# Patient Record
Sex: Male | Born: 1940 | Race: White | Hispanic: No | Marital: Married | Smoking: Former smoker
Health system: Southern US, Community
[De-identification: ages and names within clinical notes are randomized; demographics above are authoritative.]

## PROBLEM LIST (undated history)

## (undated) DIAGNOSIS — I499 Cardiac arrhythmia, unspecified: Secondary | ICD-10-CM

## (undated) DIAGNOSIS — I509 Heart failure, unspecified: Secondary | ICD-10-CM

## (undated) DIAGNOSIS — C61 Malignant neoplasm of prostate: Secondary | ICD-10-CM

## (undated) DIAGNOSIS — E785 Hyperlipidemia, unspecified: Secondary | ICD-10-CM

## (undated) DIAGNOSIS — I2510 Atherosclerotic heart disease of native coronary artery without angina pectoris: Secondary | ICD-10-CM

## (undated) DIAGNOSIS — Z923 Personal history of irradiation: Secondary | ICD-10-CM

## (undated) DIAGNOSIS — I739 Peripheral vascular disease, unspecified: Secondary | ICD-10-CM

## (undated) DIAGNOSIS — R0600 Dyspnea, unspecified: Secondary | ICD-10-CM

## (undated) DIAGNOSIS — N189 Chronic kidney disease, unspecified: Secondary | ICD-10-CM

## (undated) DIAGNOSIS — Z87442 Personal history of urinary calculi: Secondary | ICD-10-CM

## (undated) DIAGNOSIS — C801 Malignant (primary) neoplasm, unspecified: Secondary | ICD-10-CM

## (undated) DIAGNOSIS — M1990 Unspecified osteoarthritis, unspecified site: Secondary | ICD-10-CM

## (undated) DIAGNOSIS — J449 Chronic obstructive pulmonary disease, unspecified: Secondary | ICD-10-CM

## (undated) DIAGNOSIS — I10 Essential (primary) hypertension: Secondary | ICD-10-CM

## (undated) DIAGNOSIS — E119 Type 2 diabetes mellitus without complications: Secondary | ICD-10-CM

## (undated) HISTORY — DX: Hyperlipidemia, unspecified: E78.5

## (undated) HISTORY — PX: SPINAL FUSION: SHX223

## (undated) HISTORY — PX: PROSTATE CRYOABLATION: SUR358

## (undated) HISTORY — PX: AMPUTATION FINGER / THUMB: SUR24

## (undated) HISTORY — DX: Cardiac arrhythmia, unspecified: I49.9

## (undated) HISTORY — DX: Malignant neoplasm of prostate: C61

## (undated) HISTORY — DX: Heart failure, unspecified: I50.9

## (undated) HISTORY — DX: Chronic obstructive pulmonary disease, unspecified: J44.9

---

## 1973-10-20 HISTORY — PX: BACK SURGERY: SHX140

## 2005-06-01 ENCOUNTER — Emergency Department: Payer: Self-pay | Admitting: Emergency Medicine

## 2006-03-12 ENCOUNTER — Ambulatory Visit: Payer: Self-pay | Admitting: Urology

## 2006-03-17 ENCOUNTER — Ambulatory Visit: Payer: Self-pay | Admitting: Urology

## 2006-03-23 ENCOUNTER — Ambulatory Visit: Payer: Self-pay | Admitting: Urology

## 2006-05-12 ENCOUNTER — Ambulatory Visit: Payer: Self-pay | Admitting: Urology

## 2006-05-12 ENCOUNTER — Other Ambulatory Visit: Payer: Self-pay

## 2006-05-19 ENCOUNTER — Ambulatory Visit: Payer: Self-pay | Admitting: Urology

## 2006-11-09 ENCOUNTER — Ambulatory Visit: Payer: Self-pay | Admitting: Urology

## 2007-12-08 ENCOUNTER — Ambulatory Visit: Payer: Self-pay | Admitting: Unknown Physician Specialty

## 2007-12-19 ENCOUNTER — Ambulatory Visit: Payer: Self-pay | Admitting: Unknown Physician Specialty

## 2012-07-14 DIAGNOSIS — C61 Malignant neoplasm of prostate: Secondary | ICD-10-CM | POA: Insufficient documentation

## 2012-07-14 DIAGNOSIS — N62 Hypertrophy of breast: Secondary | ICD-10-CM | POA: Insufficient documentation

## 2012-07-14 DIAGNOSIS — N529 Male erectile dysfunction, unspecified: Secondary | ICD-10-CM | POA: Insufficient documentation

## 2012-10-20 HISTORY — PX: STENT PLACEMENT ILIAC (ARMC HX): HXRAD1735

## 2012-11-30 ENCOUNTER — Ambulatory Visit: Payer: Self-pay | Admitting: Vascular Surgery

## 2012-11-30 LAB — BASIC METABOLIC PANEL
Anion Gap: 8 (ref 7–16)
BUN: 20 mg/dL — ABNORMAL HIGH (ref 7–18)
Chloride: 107 mmol/L (ref 98–107)
Co2: 24 mmol/L (ref 21–32)
EGFR (Non-African Amer.): 54 — ABNORMAL LOW
Glucose: 197 mg/dL — ABNORMAL HIGH (ref 65–99)
Osmolality: 286 (ref 275–301)
Potassium: 5.2 mmol/L — ABNORMAL HIGH (ref 3.5–5.1)
Sodium: 139 mmol/L (ref 136–145)

## 2013-05-04 DIAGNOSIS — R339 Retention of urine, unspecified: Secondary | ICD-10-CM | POA: Insufficient documentation

## 2014-04-04 ENCOUNTER — Ambulatory Visit: Payer: Self-pay | Admitting: Gastroenterology

## 2014-04-07 LAB — PATHOLOGY REPORT

## 2014-05-12 DIAGNOSIS — N183 Chronic kidney disease, stage 3 unspecified: Secondary | ICD-10-CM | POA: Insufficient documentation

## 2014-05-12 DIAGNOSIS — N1832 Chronic kidney disease, stage 3b: Secondary | ICD-10-CM | POA: Insufficient documentation

## 2015-02-02 DIAGNOSIS — I10 Essential (primary) hypertension: Secondary | ICD-10-CM | POA: Insufficient documentation

## 2015-02-02 DIAGNOSIS — M5136 Other intervertebral disc degeneration, lumbar region: Secondary | ICD-10-CM | POA: Insufficient documentation

## 2015-02-02 DIAGNOSIS — E1122 Type 2 diabetes mellitus with diabetic chronic kidney disease: Secondary | ICD-10-CM | POA: Insufficient documentation

## 2015-02-02 DIAGNOSIS — E7800 Pure hypercholesterolemia, unspecified: Secondary | ICD-10-CM | POA: Insufficient documentation

## 2015-02-09 NOTE — Op Note (Signed)
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   PROCEDURES PERFORMED: 1.  Abdominal aortogram.  2.  Percutaneous transluminal angioplasty and stent placement of the common iliac artery to 8 mm.  3.  Percutaneous transluminal angioplasty and stent placement of the left external iliac artery to 7 mm.   SURGEON: Hortencia Pilar, M.D.   SEDATION: Versed 4 mg plus fentanyl 150 mcg administered IV. Continuous ECG, pulse oximetry and cardiopulmonary monitoring was performed throughout the entire procedure by the interventional radiology nurse. Total sedation time was 50 minutes.   ACCESS: A 6 French sheath left common femoral artery.   FLUOROSCOPIC TIME: 3.2 minutes.   CONTRAST USED: Isovue 60 mL.   INDICATIONS: The patient is a 74 year old gentleman who presented with short-distance claudication and hip pain. Workup demonstrated subtotal occlusion of the common iliac on the left as well as diffuse disease throughout the iliac system on the left. The risks and benefits for angiography and possible intervention were reviewed. All questions answered. The patient has agreed to proceed.   DESCRIPTION OF PROCEDURE: The patient is taken to special procedures and placed in the supine position. After adequate sedation is achieved, the ultrasound is placed in a sterile sleeve. The common femoral artery is identified. It is echolucent and pulsatile, indicating patency. Images recorded for the permanent record. After scanning down the artery and localizing the femoral bifurcation, the artery is evaluated slightly more proximally. Then 1% lidocaine is infiltrated. Under real-time visualization,  microneedle is inserted into the anterior wall of the common femoral artery, microwire followed by micro sheath, J-wire followed by 5 French sheath and 5 French pigtail catheter. AP projection of the aorta is obtained, which demonstrates subtotal occlusion of the left common iliac artery, approximately 5 to 6 mm distal to the origin of the common iliac. Pigtail catheter is then repositioned and oblique views are obtained. Then 4000 units of heparin are given. Magic torque wire is introduced and the sheath is upsized from a 5 to a 6 [place] sheath.   An Omnilink a 7 x 19 stent is then advanced across the common iliac lesion and deployed to 10 atmospheres. Followup imaging suggested it is still somewhat under sized, but in perfect location and therefore an 8 mm balloon is used to post dilate the common iliac. The 8 mm inflation is for 1 minute to 10 atmospheres.   Attention is then turned, after followup imaging, to the origin of the external iliac, where an 8 x 30 LifeStar stent is deployed and then post dilated using the 7 mm balloon. Followup angiography demonstrates excellent result and there is now rapid flow of contrast down to the common femoral on the left. Oblique view of the groin is then obtained and a ProGlide device deployed. Initial ProGlide still shows significant oozing and therefore a second device is deployed before removing the wire. Light pressure was then held for 10 minutes and there are no immediate complications.   INTERPRETATION: The abdominal aorta is opacified with a bolus injection of contrast. Although  moderate atherosclerotic changes are noted, there are no hemodynamically significant stenoses. Bilateral renal arteries are noted, single renal artery to each kidney. Bilateral nephrograms are noted normal size. There is no evidence of hemodynamically significant renal artery stenosis. The right common  and external iliac arteries are widely patent. The left common iliac artery  demonstrates a 95% occlusion over several centimeters. There is a moderate stenosis at the origin of the internal. There is a severe stenosis at the origin of the external iliac artery on the left. Common femoral demonstrates diffuse disease, but does not appear to be hemodynamically significant.   Following angioplasty and post dilatation to 8 mm, the common iliac artery is well treated with minimal residual stenosis. Following stent placement and angioplasty to 7 mm to the external iliac artery, this artery is also well treated.   SUMMARY: Successful revascularization left lower extremity with treatment of multiple lesions within the left iliac system.     ____________________________ Katha Cabal, MD ggs:cc D: 11/30/2012 15:07:50 ET T: 11/30/2012 20:48:05 ET JOB#: [id number]  cc: Katha Cabal, MD, <Dictator> Lorenza Evangelist, MD  Katha Cabal MD ELECTRONICALLY SIGNED 12/06/2012 23:27

## 2015-05-13 DIAGNOSIS — K130 Diseases of lips: Secondary | ICD-10-CM | POA: Insufficient documentation

## 2015-06-21 ENCOUNTER — Other Ambulatory Visit: Payer: Self-pay | Admitting: Physical Medicine and Rehabilitation

## 2015-06-21 DIAGNOSIS — M5416 Radiculopathy, lumbar region: Secondary | ICD-10-CM

## 2015-06-29 ENCOUNTER — Ambulatory Visit
Admission: RE | Admit: 2015-06-29 | Discharge: 2015-06-29 | Disposition: A | Discharge status: Home / Self Care | Payer: Medicare Other | Source: Ambulatory Visit | Attending: Physical Medicine and Rehabilitation | Admitting: Physical Medicine and Rehabilitation

## 2015-06-29 DIAGNOSIS — M4806 Spinal stenosis, lumbar region: Secondary | ICD-10-CM | POA: Diagnosis not present

## 2015-06-29 DIAGNOSIS — M5186 Other intervertebral disc disorders, lumbar region: Secondary | ICD-10-CM | POA: Insufficient documentation

## 2015-06-29 DIAGNOSIS — M47896 Other spondylosis, lumbar region: Secondary | ICD-10-CM | POA: Diagnosis not present

## 2015-06-29 DIAGNOSIS — M79605 Pain in left leg: Secondary | ICD-10-CM | POA: Diagnosis present

## 2015-06-29 DIAGNOSIS — M79604 Pain in right leg: Secondary | ICD-10-CM | POA: Diagnosis present

## 2015-06-29 DIAGNOSIS — M545 Low back pain: Secondary | ICD-10-CM | POA: Diagnosis present

## 2015-06-29 DIAGNOSIS — M5416 Radiculopathy, lumbar region: Secondary | ICD-10-CM

## 2015-07-10 DIAGNOSIS — M48062 Spinal stenosis, lumbar region with neurogenic claudication: Secondary | ICD-10-CM | POA: Insufficient documentation

## 2016-06-13 ENCOUNTER — Encounter: Payer: Self-pay | Admitting: Emergency Medicine

## 2016-06-13 ENCOUNTER — Emergency Department
Admission: EM | Admit: 2016-06-13 | Discharge: 2016-06-13 | Disposition: A | Discharge status: Home / Self Care | Payer: Medicare Other | Attending: Student | Admitting: Student

## 2016-06-13 DIAGNOSIS — Z8546 Personal history of malignant neoplasm of prostate: Secondary | ICD-10-CM | POA: Diagnosis not present

## 2016-06-13 DIAGNOSIS — Z7984 Long term (current) use of oral hypoglycemic drugs: Secondary | ICD-10-CM | POA: Insufficient documentation

## 2016-06-13 DIAGNOSIS — Z79899 Other long term (current) drug therapy: Secondary | ICD-10-CM | POA: Diagnosis not present

## 2016-06-13 DIAGNOSIS — Z87891 Personal history of nicotine dependence: Secondary | ICD-10-CM | POA: Insufficient documentation

## 2016-06-13 DIAGNOSIS — E119 Type 2 diabetes mellitus without complications: Secondary | ICD-10-CM | POA: Insufficient documentation

## 2016-06-13 DIAGNOSIS — M25551 Pain in right hip: Secondary | ICD-10-CM | POA: Insufficient documentation

## 2016-06-13 DIAGNOSIS — G8929 Other chronic pain: Secondary | ICD-10-CM | POA: Diagnosis not present

## 2016-06-13 DIAGNOSIS — Z791 Long term (current) use of non-steroidal anti-inflammatories (NSAID): Secondary | ICD-10-CM | POA: Insufficient documentation

## 2016-06-13 HISTORY — DX: Unspecified osteoarthritis, unspecified site: M19.90

## 2016-06-13 HISTORY — DX: Malignant (primary) neoplasm, unspecified: C80.1

## 2016-06-13 HISTORY — DX: Type 2 diabetes mellitus without complications: E11.9

## 2016-06-13 MED ORDER — MELOXICAM 7.5 MG PO TABS
7.5000 mg | ORAL_TABLET | Freq: Once | ORAL | Status: AC
  Administered 2016-06-13: 7.5 mg via ORAL
  Filled 2016-06-13: qty 1

## 2016-06-13 MED ORDER — LIDOCAINE 4 % EX PTCH: 1.0000 | MEDICATED_PATCH | Freq: Every day | CUTANEOUS | 0 refills | Status: DC

## 2016-06-13 MED ORDER — BACLOFEN 10 MG PO TABS: 10.0000 mg | ORAL_TABLET | Freq: Three times a day (TID) | ORAL | 0 refills | Status: DC

## 2016-06-13 MED ORDER — MELOXICAM 7.5 MG PO TABS: 7.5000 mg | ORAL_TABLET | Freq: Every day | ORAL | 0 refills | Status: AC

## 2016-06-13 NOTE — ED Provider Notes (Signed)
[facility] Emergency Department Provider Note ____________________________________________  Time seen: Approximately 3:59 PM  I have reviewed the triage vital signs and the nursing notes.   HISTORY  Chief Complaint Hip Pain and Leg Pain    HPI Carlos Levine is a 75 y.o. male who presents to the emergency department for chronic pain management. He has been having severe right hip pain for the past couple of months. He has been evaluated by his primary care provider who treated him with oxycodone initially. He states that it did help some, however he is afraid to continue taking the medication because he "doesn't want to get hooked." His medication was then changed to tramadol, which has provided little to no relief and again he states that he doesn't want to get hooked. He states that over the past week, the pain in his hip has gotten much worse and he has become very interactive. He states that he stayed in bed most of the day, even for meals. He is not taking any pain medications at this time. He denies new injury. He denies a change in the location or quality of the pain. He states that stretching the muscles around the hip seems to be the only thing that provides any relief. He states that he pulls his knee up toward his chest as close as he can get it and hold onto it for a few minutes, this seems to relieve his pain as long as he does not get out of bed and move around.  Past Medical History:  Diagnosis Date  . Arthritis   . Cancer [facility])    prostate  . Diabetes mellitus without complication ([place])     There are no active problems to display for this patient.   Past Surgical History:  Procedure Laterality Date  . SPINAL FUSION      Prior to Admission medications   Medication Sig Start Date End Date Taking? Authorizing Provider  amLODipine (NORVASC) 5 MG tablet Take 5 mg by mouth daily.   Yes Historical Provider, MD  bicalutamide (CASODEX) 50 MG tablet  Take 50 mg by mouth daily.   Yes Historical Provider, MD  glimepiride (AMARYL) 4 MG tablet Take 4 mg by mouth daily with breakfast.   Yes Historical Provider, MD  pioglitazone (ACTOS) 45 MG tablet Take 45 mg by mouth daily.   Yes Historical Provider, MD  pravastatin (PRAVACHOL) 80 MG tablet Take 80 mg by mouth daily.   Yes Historical Provider, MD  traMADol (ULTRAM) 50 MG tablet Take 50 mg by mouth every 6 (six) hours as needed.   Yes Historical Provider, MD  baclofen (LIORESAL) 10 MG tablet Take 1 tablet (10 mg total) by mouth 3 (three) times daily. 06/13/16   Victorino Dike, FNP  Lidocaine 4 % PTCH Apply 1 patch topically daily. 06/13/16   Victorino Dike, FNP  meloxicam (MOBIC) 7.5 MG tablet Take 1 tablet (7.5 mg total) by mouth daily. 06/13/16 07/13/16  Victorino Dike, FNP    Allergies Ivp dye [iodinated diagnostic agents]  No family history on file.  Social History Social History  Substance Use Topics  . Smoking status: Former Research scientist (life sciences)  . Smokeless tobacco: Never Used  . Alcohol use No    Review of Systems Constitutional: No recent illness. Cardiovascular: Denies chest pain or palpitations. Respiratory: Denies shortness of breath. Musculoskeletal: Pain in Right hip Skin: Negative for rash, wound, lesion. Neurological: Negative for focal weakness or numbness.  ____________________________________________  PHYSICAL EXAM:  VITAL SIGNS: ED Triage Vitals  Enc Vitals Group     BP 06/13/16 1412 113/70     Pulse Rate 06/13/16 1412 82     Resp 06/13/16 1412 20     Temp 06/13/16 1412 97.7 F (36.5 C)     Temp Source 06/13/16 1412 Oral     SpO2 06/13/16 1412 97 %     Weight 06/13/16 1413 264 lb (119.7 kg)     Height 06/13/16 1413 6' (1.829 m)     Head Circumference --      Peak Flow --      Pain Score 06/13/16 1416 10     Pain Loc --      Pain Edu? --      Excl. in [place]? --     Constitutional: Alert and oriented. Well appearing and in no acute distress. Eyes: Conjunctivae  are normal. EOMI. Head: Atraumatic. Neck: No stridor.  Respiratory: Normal respiratory effort.   Musculoskeletal: Right hip pain throughout range of motion Neurologic:  Normal speech and language. No gross focal neurologic deficits are appreciated. Speech is normal. No gait instability. Skin:  Skin is warm, dry and intact. Atraumatic. Psychiatric: Mood and affect are normal. Speech and behavior are normal.  ____________________________________________   LABS (all labs ordered are listed, but only abnormal results are displayed)  Labs Reviewed - No data to display ____________________________________________  RADIOLOGY  Not indicated ____________________________________________   PROCEDURES  Procedure(s) performed: None   ____________________________________________   INITIAL IMPRESSION / ASSESSMENT AND PLAN / ED COURSE  Pertinent labs & imaging results that were available during my care of the patient were reviewed by me and considered in my medical decision making (see chart for details).  Medication management was discussed with the patient. Tonight, he will be given prescriptions for meloxicam 7.5 mg, baclofen 20 mg, and lidocaine patches. He was advised to call his primary care provider and discuss his new medications. He was advised that the meloxicam may cause a decrease in kidney function and this will need to be monitored by primary care. He states that if this helps with his pain, he will have to manage his kidney function some other way and continue the medication. He states that the pain is severe and something must be done, but refuses to take any type of controlled substance for pain management. Patient was advised to return to the emergency department for any symptom changes or worsen if he is unable schedule an appointment with his primary care provider. ____________________________________________   FINAL CLINICAL IMPRESSION(S) / ED DIAGNOSES  Final  diagnoses:  Chronic hip pain, right       Victorino Dike, FNP 06/14/16 0010    Joanne Gavel, MD 06/14/16 0040

## 2016-06-13 NOTE — ED Notes (Signed)
See triage note. States he has been having increased pain to right hip area for about 1 month.. Has been seen by PCP   Had MRI done.  States pain is getting progressively worse  Denies recent injury

## 2016-06-13 NOTE — ED Triage Notes (Signed)
C/O right hip and leg pain.  Onset of symptoms 1 month ago.  Has seen PCP for same complaint and had MRI done that showed arthritis and DJD

## 2016-06-13 NOTE — Discharge Instructions (Signed)
Please see Dr. Candiss Norse in about a week. Use your cane for the next few days. Increase the amount of water you are drinking.

## 2016-06-24 DIAGNOSIS — R42 Dizziness and giddiness: Secondary | ICD-10-CM | POA: Insufficient documentation

## 2017-06-11 ENCOUNTER — Emergency Department: Payer: Medicare Other

## 2017-06-11 ENCOUNTER — Observation Stay
Admission: EM | Admit: 2017-06-11 | Discharge: 2017-06-13 | Disposition: A | Discharge status: Home / Self Care | Payer: Medicare Other | Attending: Internal Medicine | Admitting: Internal Medicine

## 2017-06-11 ENCOUNTER — Encounter: Payer: Self-pay | Admitting: Emergency Medicine

## 2017-06-11 DIAGNOSIS — I10 Essential (primary) hypertension: Secondary | ICD-10-CM | POA: Diagnosis not present

## 2017-06-11 DIAGNOSIS — I129 Hypertensive chronic kidney disease with stage 1 through stage 4 chronic kidney disease, or unspecified chronic kidney disease: Secondary | ICD-10-CM | POA: Diagnosis not present

## 2017-06-11 DIAGNOSIS — Z79899 Other long term (current) drug therapy: Secondary | ICD-10-CM | POA: Insufficient documentation

## 2017-06-11 DIAGNOSIS — E1122 Type 2 diabetes mellitus with diabetic chronic kidney disease: Secondary | ICD-10-CM | POA: Diagnosis not present

## 2017-06-11 DIAGNOSIS — R7989 Other specified abnormal findings of blood chemistry: Secondary | ICD-10-CM

## 2017-06-11 DIAGNOSIS — R55 Syncope and collapse: Secondary | ICD-10-CM | POA: Diagnosis present

## 2017-06-11 DIAGNOSIS — Z7984 Long term (current) use of oral hypoglycemic drugs: Secondary | ICD-10-CM | POA: Diagnosis not present

## 2017-06-11 DIAGNOSIS — N183 Chronic kidney disease, stage 3 (moderate): Secondary | ICD-10-CM | POA: Insufficient documentation

## 2017-06-11 DIAGNOSIS — M5136 Other intervertebral disc degeneration, lumbar region: Secondary | ICD-10-CM | POA: Insufficient documentation

## 2017-06-11 DIAGNOSIS — I700 Atherosclerosis of aorta: Secondary | ICD-10-CM | POA: Insufficient documentation

## 2017-06-11 DIAGNOSIS — R296 Repeated falls: Secondary | ICD-10-CM | POA: Insufficient documentation

## 2017-06-11 DIAGNOSIS — Z87891 Personal history of nicotine dependence: Secondary | ICD-10-CM | POA: Insufficient documentation

## 2017-06-11 DIAGNOSIS — R748 Abnormal levels of other serum enzymes: Secondary | ICD-10-CM | POA: Diagnosis present

## 2017-06-11 DIAGNOSIS — E11649 Type 2 diabetes mellitus with hypoglycemia without coma: Principal | ICD-10-CM | POA: Insufficient documentation

## 2017-06-11 DIAGNOSIS — R778 Other specified abnormalities of plasma proteins: Secondary | ICD-10-CM | POA: Diagnosis present

## 2017-06-11 LAB — CBC
HCT: 44.1 % (ref 40.0–52.0)
HEMOGLOBIN: 14.8 g/dL (ref 13.0–18.0)
MCH: 31.5 pg (ref 26.0–34.0)
MCHC: 33.5 g/dL (ref 32.0–36.0)
MCV: 94.2 fL (ref 80.0–100.0)
PLATELETS: 219 10*3/uL (ref 150–440)
RBC: 4.68 MIL/uL (ref 4.40–5.90)
RDW: 14.1 % (ref 11.5–14.5)
WBC: 9.9 10*3/uL (ref 3.8–10.6)

## 2017-06-11 LAB — GLUCOSE, CAPILLARY
GLUCOSE-CAPILLARY: 206 mg/dL — AB (ref 65–99)
Glucose-Capillary: 57 mg/dL — ABNORMAL LOW (ref 65–99)
Glucose-Capillary: 208 mg/dL — ABNORMAL HIGH (ref 65–99)

## 2017-06-11 LAB — TROPONIN I
TROPONIN I: 0.17 ng/mL — AB (ref <0.03)
Troponin I: 0.14 ng/mL (ref <0.03)

## 2017-06-11 LAB — BASIC METABOLIC PANEL
ANION GAP: 10 (ref 5–15)
BUN: 22 mg/dL — ABNORMAL HIGH (ref 6–20)
CALCIUM: 9.5 mg/dL (ref 8.9–10.3)
CHLORIDE: 107 mmol/L (ref 101–111)
CO2: 22 mmol/L (ref 22–32)
CREATININE: 1.78 mg/dL — AB (ref 0.61–1.24)
GFR calc non Af Amer: 35 mL/min — ABNORMAL LOW (ref >60)
GFR, EST AFRICAN AMERICAN: 41 mL/min — AB (ref >60)
Glucose, Bld: 49 mg/dL — ABNORMAL LOW (ref 65–99)
Potassium: 4.3 mmol/L (ref 3.5–5.1)
SODIUM: 139 mmol/L (ref 135–145)

## 2017-06-11 LAB — PROTIME-INR
INR: 1.07
Prothrombin Time: 13.9 seconds (ref 11.4–15.2)

## 2017-06-11 MED ORDER — SODIUM CHLORIDE 0.9 % IV SOLN
INTRAVENOUS | Status: DC
  Administered 2017-06-11 – 2017-06-12 (×2): via INTRAVENOUS

## 2017-06-11 MED ORDER — ONDANSETRON HCL 4 MG/2ML IJ SOLN: 4.0000 mg | Freq: Four times a day (QID) | INTRAMUSCULAR | Status: DC | PRN

## 2017-06-11 MED ORDER — GLIMEPIRIDE 4 MG PO TABS
4.0000 mg | ORAL_TABLET | Freq: Every day | ORAL | Status: DC
  Filled 2017-06-11: qty 1

## 2017-06-11 MED ORDER — ONDANSETRON HCL 4 MG PO TABS: 4.0000 mg | ORAL_TABLET | Freq: Four times a day (QID) | ORAL | Status: DC | PRN

## 2017-06-11 MED ORDER — ACETAMINOPHEN 650 MG RE SUPP: 650.0000 mg | Freq: Four times a day (QID) | RECTAL | Status: DC | PRN

## 2017-06-11 MED ORDER — BICALUTAMIDE 50 MG PO TABS
50.0000 mg | ORAL_TABLET | Freq: Every day | ORAL | Status: DC
  Administered 2017-06-12 – 2017-06-13 (×2): 50 mg via ORAL
  Filled 2017-06-11 (×2): qty 1

## 2017-06-11 MED ORDER — DEXTROSE 50 % IV SOLN
50.0000 mL | Freq: Once | INTRAVENOUS | Status: AC
  Administered 2017-06-11: 50 mL via INTRAVENOUS
  Filled 2017-06-11: qty 50

## 2017-06-11 MED ORDER — PRAVASTATIN SODIUM 20 MG PO TABS
80.0000 mg | ORAL_TABLET | Freq: Every day | ORAL | Status: DC
  Administered 2017-06-11 – 2017-06-13 (×3): 80 mg via ORAL
  Filled 2017-06-11 (×3): qty 4

## 2017-06-11 MED ORDER — INSULIN ASPART 100 UNIT/ML ~~LOC~~ SOLN
0.0000 [IU] | Freq: Every day | SUBCUTANEOUS | Status: DC
  Administered 2017-06-11 – 2017-06-12 (×2): 2 [IU] via SUBCUTANEOUS
  Filled 2017-06-11 (×2): qty 1

## 2017-06-11 MED ORDER — AMLODIPINE BESYLATE 5 MG PO TABS
5.0000 mg | ORAL_TABLET | Freq: Every day | ORAL | Status: DC
  Administered 2017-06-12: 5 mg via ORAL
  Filled 2017-06-11: qty 1

## 2017-06-11 MED ORDER — INSULIN ASPART 100 UNIT/ML ~~LOC~~ SOLN
0.0000 [IU] | Freq: Three times a day (TID) | SUBCUTANEOUS | Status: DC
Start: 2017-06-12 — End: 2017-06-13
  Administered 2017-06-12 (×2): 2 [IU] via SUBCUTANEOUS
  Filled 2017-06-11 (×2): qty 1

## 2017-06-11 MED ORDER — ACETAMINOPHEN 325 MG PO TABS: 650.0000 mg | ORAL_TABLET | Freq: Four times a day (QID) | ORAL | Status: DC | PRN

## 2017-06-11 MED ORDER — PIOGLITAZONE HCL 15 MG PO TABS
45.0000 mg | ORAL_TABLET | Freq: Every day | ORAL | Status: DC
  Administered 2017-06-12: 45 mg via ORAL
  Filled 2017-06-11: qty 1

## 2017-06-11 MED ORDER — HEPARIN SODIUM (PORCINE) 5000 UNIT/ML IJ SOLN
5000.0000 [IU] | Freq: Three times a day (TID) | INTRAMUSCULAR | Status: DC
Start: 2017-06-11 — End: 2017-06-13
  Administered 2017-06-11 – 2017-06-13 (×5): 5000 [IU] via SUBCUTANEOUS
  Filled 2017-06-11 (×5): qty 1

## 2017-06-11 NOTE — H&P (Signed)
Carlos Levine is an 76 y.o. male.   Chief Complaint: falls HPI: this is a 76 year old male who's had a history of falling multiple times over the past few days. He said while mowing his grass or riding lawn more that he passed out. His grandsons with him but was not witnessed to this. He does not complain of any chest pain. He says he gets dizzy sometimes and hasn't been eating well the last couple days because of nausea. Here in the ER is found to have a mildly elevated troponin.  Past Medical History:  Diagnosis Date  . Arthritis   . Cancer [facility])    prostate  . Diabetes mellitus without complication [facility])     Past Surgical History:  Procedure Laterality Date  . SPINAL FUSION      History reviewed. No pertinent family history. Social History:  reports that he has quit smoking. He has never used smokeless tobacco. He reports that he does not drink alcohol or use drugs.  Allergies:  Allergies  Allergen Reactions  . Ivp Dye [Iodinated Diagnostic Agents]      (Not in a hospital admission)  Results for orders placed or performed during the hospital encounter of 06/11/17 (from the past 48 hour(s))  Basic metabolic panel     Status: Abnormal   Collection Time: 06/11/17  5:21 PM  Result Value Ref Range   Sodium 139 135 - 145 mmol/L   Potassium 4.3 3.5 - 5.1 mmol/L   Chloride 107 101 - 111 mmol/L   CO2 22 22 - 32 mmol/L   Glucose, Bld 49 (L) 65 - 99 mg/dL   BUN 22 (H) 6 - 20 mg/dL   Creatinine, Ser 1.78 (H) 0.61 - 1.24 mg/dL   Calcium 9.5 8.9 - 10.3 mg/dL   GFR calc non Af Amer 35 (L) >60 mL/min   GFR calc Af Amer 41 (L) >60 mL/min    Comment: (NOTE) The eGFR has been calculated using the CKD EPI equation. This calculation has not been validated in all clinical situations. eGFR's persistently <60 mL/min signify possible Chronic Kidney Disease.    Anion gap 10 5 - 15  CBC     Status: None   Collection Time: 06/11/17  5:21 PM  Result Value Ref Range   WBC 9.9 3.8 - 10.6  K/uL   RBC 4.68 4.40 - 5.90 MIL/uL   Hemoglobin 14.8 13.0 - 18.0 g/dL   HCT 44.1 40.0 - 52.0 %   MCV 94.2 80.0 - 100.0 fL   MCH 31.5 26.0 - 34.0 pg   MCHC 33.5 32.0 - 36.0 g/dL   RDW 14.1 11.5 - 14.5 %   Platelets 219 150 - 440 K/uL  Troponin I     Status: Abnormal   Collection Time: 06/11/17  5:21 PM  Result Value Ref Range   Troponin I 0.17 (HH) <0.03 ng/mL    Comment: CRITICAL RESULT CALLED TO, READ BACK BY AND VERIFIED WITH ASHLEY SMITH @ 5320 06/11/17 BY [facility]   Protime-INR     Status: None   Collection Time: 06/11/17  5:36 PM  Result Value Ref Range   Prothrombin Time 13.9 11.4 - 15.2 seconds   INR 1.07    Dg Chest 2 View  Result Date: 06/11/2017 CLINICAL DATA:  Patient with multiple falls.  Syncope. EXAM: CHEST  2 VIEW COMPARISON:  None. FINDINGS: Cardiac contours upper limits of normal. Aortic atherosclerosis. No consolidative pulmonary opacities. No pleural effusion or pneumothorax. Thoracic spine degenerative changes.  IMPRESSION: No acute cardiopulmonary process. Electronically Signed   By: Lovey Newcomer M.D.   On: 06/11/2017 18:38   Dg Pelvis 1-2 Views  Result Date: 06/11/2017 CLINICAL DATA:  Multiple falls.  Tonight.  Concern for fracture. EXAM: PELVIS - 1-2 VIEW COMPARISON:  11/09/2006 abdomen radiographs FINDINGS: There is lower lumbar degenerative disc disease of the included lumbar spine from L2 through S1 with mild levoconvex curvature, osteophytes but without evidence of acute fracture. Sclerosis about both SI joints there are again noted which may reflect changes of chronic sacroiliitis or osteoarthritis. Vascular stent projects over the left sacral ala. No pelvic fracture is noted. Overlying bowel partially obscures the lower sacrum and coccyx. The hip joints maintained without acute osseous appearing abnormality. IMPRESSION: 1. Lumbar spondylosis. 2. 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 3. No acute pelvic or hip fracture is  identified. Electronically Signed   By: Ashley Royalty M.D.   On: 06/11/2017 18:41   Ct Head Wo Contrast  Result Date: 06/11/2017 CLINICAL DATA:  Dizziness and falls x4 last evening. EXAM: CT HEAD WITHOUT CONTRAST TECHNIQUE: Contiguous axial images were obtained from the base of the skull through the vertex without intravenous contrast. COMPARISON:  None. FINDINGS: BRAIN: There is sulcal and ventricular prominence consistent with superficial and central atrophy. No intraparenchymal hemorrhage, mass effect nor midline shift. Periventricular and subcortical white matter hypodensities consistent with chronic small vessel ischemic disease are identified. No acute large vascular territory infarcts. No abnormal extra-axial fluid collections. Basal cisterns are not effaced and midline. VASCULAR: Moderate calcific atherosclerosis of the carotid siphons. SKULL: No skull fracture. No significant scalp soft tissue swelling. SINUSES/ORBITS: The mastoid air-cells are clear. The included paranasal sinuses are well-aerated.Postsurgical changes of both globes. Intact orbits bilaterally. OTHER: None. IMPRESSION: Atrophy with chronic appearing small vessel ischemic disease of periventricular and subcortical white matter. No acute intracranial abnormality. Electronically Signed   By: Ashley Royalty M.D.   On: 06/11/2017 18:44    Review of Systems  Constitutional: Negative for fever.  HENT: Negative for hearing loss.   Eyes: Negative for blurred vision.  Respiratory: Negative for cough.   Cardiovascular: Negative for chest pain.  Gastrointestinal: Positive for nausea.  Genitourinary: Negative for dysuria.  Musculoskeletal: Negative for myalgias.  Skin: Negative for rash.  Neurological: Positive for dizziness.    Blood pressure 107/69, pulse 74, temperature 97.9 F (36.6 C), temperature source Oral, resp. rate 14, height 6' (1.829 m), weight 103 kg (227 lb), SpO2 98 %. Physical Exam  Constitutional: He is oriented to  person, place, and time. He appears well-developed and well-nourished. No distress.  HENT:  Head: Normocephalic and atraumatic.  Mouth/Throat: Oropharynx is clear and moist. No oropharyngeal exudate.  Eyes: Pupils are equal, round, and reactive to light. No scleral icterus.  Neck: Normal range of motion. Neck supple. No thyromegaly present.  Cardiovascular: Normal rate and regular rhythm.   No murmur heard. Respiratory: Effort normal and breath sounds normal. No respiratory distress. He exhibits no tenderness.  GI: Soft. Bowel sounds are normal. He exhibits no mass.  Musculoskeletal: He exhibits no edema.  Lymphadenopathy:    He has no cervical adenopathy.  Neurological: He is alert and oriented to person, place, and time. No cranial nerve deficit.  Skin: Skin is warm and dry.     Assessment/Plan 1. Elevated troponin. He has no symptoms of chest pain or shortness of breath. However he does have the dizziness at times and nausea. These could be anginal  equivalents. He does have stage III chronic renal failure which may account for some of the troponin elevation but this does seem be a little bit out of proportion. I'll go ahead and cycle his enzymes. Order a echocardiogram and a cardiology consult to evaluate him for any further workup. 2. Syncope. At this point from the history it sounds like cardiac. He was not orthostatic here in the ER. However we'll hydrate him gently overnight. 3. Stage III chronic renal failure. Looking at outpatient labs he is at baseline. 4. Hypertension. We'll continue his current medications 5. Diabetes. We'll continue current medications and add sliding scale next line Total time spent 45 minutes  Baxter Hire, MD 06/11/2017, 7:32 PM

## 2017-06-11 NOTE — ED Notes (Signed)
204 CBG

## 2017-06-11 NOTE — ED Notes (Signed)
Patient transported to CT 

## 2017-06-11 NOTE — ED Notes (Addendum)
Dr Quentin Cornwall notified of pt's FSBS recheck of 57 mg/dL. Verbal order for pt to be given an additional 4 oz of orange juice.

## 2017-06-11 NOTE — ED Triage Notes (Addendum)
Pt with dizziness and falls x 4 last nite and multiple falls x 1 month.. Pt denies chest pain, injury from falls. Pt is also concerned that he passed out on lawnmower last week and has been bruising easily.

## 2017-06-11 NOTE — ED Notes (Signed)
Dr Quentin Cornwall and Apolonio Schneiders, RN notified of pt's low blood glucose (49 mg/dL) at this time.

## 2017-06-11 NOTE — Progress Notes (Signed)
Patient arrived to 2A Room 240. Patient denies pain and all questions answered. Patient oriented to unit and Fall Safety Plan signed. Skin assessment completed with Sharyn Lull RN; abrasions noted to left lower extremity on toes and knee and scattered bruising. A&Ox4, VSS, and NSR on verified tele-box #[id number]. Nursing staff will continue to monitor for any changes in patient status. Earleen Reaper, RN

## 2017-06-11 NOTE — ED Provider Notes (Signed)
[facility] Emergency Department Provider Note    First MD Initiated Contact with Patient 06/11/17 1741     (approximate)  I have reviewed the triage vital signs and the nursing notes.   HISTORY  Chief Complaint Fall    HPI Carlos Levine is a 76 y.o. male presenting with for syncopal episodes and falls last night.  He denies any chest pain or palpitations. Describes the episodes of sudden. States he's been having these episodes multiple timin the past seral weeks but it 4 times happened last night.  Does not seem to have a good explanation of why he is falling. Denies any pain. Denies any palpitations and does not recall any chest discomfort or hortness of breath. No lower extremity swelling. D   Past Medical History:  Diagnosis Date  . Arthritis   . Cancer [facility])    prostate  . Diabetes mellitus without complication ([place])    History reviewed. No pertinent family history. Past Surgical History:  Procedure Laterality Date  . SPINAL FUSION     There are no active problems to display for this patient.     Prior to Admission medications   Medication Sig Start Date End Date Taking? Authorizing Provider  amLODipine (NORVASC) 5 MG tablet Take 5 mg by mouth daily.    [provider]  baclofen (LIORESAL) 10 MG tablet Take 1 tablet (10 mg total) by mouth 3 (three) times daily. 06/13/16   Triplett, Johnette Abraham B, FNP  bicalutamide (CASODEX) 50 MG tablet Take 50 mg by mouth daily.    [provider]  glimepiride (AMARYL) 4 MG tablet Take 4 mg by mouth daily with breakfast.    [provider]  Lidocaine 4 % PTCH Apply 1 patch topically daily. 06/13/16   Triplett, Cari B, FNP  pioglitazone (ACTOS) 45 MG tablet Take 45 mg by mouth daily.    [provider]  pravastatin (PRAVACHOL) 80 MG tablet Take 80 mg by mouth daily.    [provider]  traMADol (ULTRAM) 50 MG tablet Take 50 mg by mouth every 6 (six) hours as needed.     [provider]    Allergies Ivp dye [iodinated diagnostic agents]    Social History Social History  Substance Use Topics  . Smoking status: Former Research scientist (life sciences)  . Smokeless tobacco: Never Used  . Alcohol use No    Review of Systems Patient denies headaches, rhinorrhea, blurry vision, numbness, shortness of breath, chest pain, edema, cough, abdominal pain, nausea, vomiting, diarrhea, dysuria, fevers, rashes or hallucinations unless otherwise stated above in HPI. ____________________________________________   PHYSICAL EXAM:  VITAL SIGNS: Vitals:   06/11/17 1711  BP: (!) 130/54  Pulse: 84  Temp: 97.9 F (36.6 C)  SpO2: 96%    Constitutional: Alert and oriented. Well appearing and in no acute distress. Eyes: Conjunctivae are normal.  Head: Atraumatic. Nose: No congestion/rhinnorhea. Mouth/Throat: Mucous membranes are moist.   Neck: No stridor. Painless ROM.  Cardiovascular: Normal rate, regular rhythm. + murmur.  Good peripheral circulation. Respiratory: Normal respiratory effort.  No retractions. Lungs CTAB. Gastrointestinal: Soft and nontender. No distention. No abdominal bruits. No CVA tenderness. Musculoskeletal: No lower extremity tenderness nor edema.  No joint effusions. Neurologic:  Normal speech and language. No gross focal neurologic deficits are appreciated. No facial droop Skin:  Skin is warm, dry and intact. Scattered ecchymosis on all 4 extremities, + abrasio to left knee, no erythema or laceration Psychiatric: Mood and affect are normal. Speech and  behavior are normal.  ____________________________________________   LABS (all labs ordered are listed, but only abnormal results are displayed)  Results for orders placed or performed during the hospital encounter of 06/11/17 (from the past 24 hour(s))  CBC     Status: None   Collection Time: 06/11/17  5:21 PM  Result Value Ref Range   WBC 9.9 3.8 - 10.6 K/uL   RBC 4.68 4.40 - 5.90 MIL/uL    Hemoglobin 14.8 13.0 - 18.0 g/dL   HCT 44.1 40.0 - 52.0 %   MCV 94.2 80.0 - 100.0 fL   MCH 31.5 26.0 - 34.0 pg   MCHC 33.5 32.0 - 36.0 g/dL   RDW 14.1 11.5 - 14.5 %   Platelets 219 150 - 440 K/uL   ____________________________________________  EKG My review and personal interpretation at Time:  17:11  Indication: syncope  Rate: 85  Rhythm: sinus Axis: normal Other: non specific st changes, no stemi ____________________________________________  RADIOLOGY  I personally reviewed all radiographic images ordered to evaluate for the above acute complaints and reviewed radiology reports and findings.  These findings were personally discussed with the patient.  Please see medical record for radiology report.  ____________________________________________   PROCEDURES  Procedure(s) performed:  Procedures    Critical Care performed: no ____________________________________________   INITIAL IMPRESSION / ASSESSMENT AND PLAN / ED COURSE  Pertinent labs & imaging results that were available during my care of the patient were reviewed by me and considered in my medical decision making (see chart for details).  DDX: .dehydration, asc, electrolyte abn, dysrhythmia, sepsis  JERMY COUPER is a 76 y.o. who presents to the ED with of what sounds like multiple syncopal evets. Blood work sent for the above differential. CT imaging ordered of the head shows no evidence of intracranial hemorrhage. His neuro exam is non focal.  Chest x-ray shows no evidence of pneumonia or pneumothorax. No evidence of pelvic fracture.Patient does have mildly elevated troponin as well as an AK I I am concerned for patient's presentation and given evidence of murmur do feel the patient needs admission to the hospital for hemodynamic monitoring echo and further reassessment.      ____________________________________________   FINAL CLINICAL IMPRESSION(S) / ED DIAGNOSES  Final diagnoses:  Syncope and collapse    Elevated troponin I level      NEW MEDICATIONS STARTED DURING THIS VISIT:  New Prescriptions   No medications on file     Note:  This document was prepared using Dragon voice recognition software and may include unintentional dictation errors.    Merlyn Lot, MD 06/11/17 [phone]

## 2017-06-11 NOTE — ED Notes (Signed)
Hospitalist to bedside at this time 

## 2017-06-12 ENCOUNTER — Observation Stay
Admit: 2017-06-12 | Discharge: 2017-06-12 | Disposition: A | Discharge status: Home / Self Care | Payer: Medicare Other | Attending: Internal Medicine | Admitting: Internal Medicine

## 2017-06-12 LAB — GLUCOSE, CAPILLARY
GLUCOSE-CAPILLARY: 60 mg/dL — AB (ref 65–99)
GLUCOSE-CAPILLARY: 158 mg/dL — AB (ref 65–99)
GLUCOSE-CAPILLARY: 155 mg/dL — AB (ref 65–99)
Glucose-Capillary: 153 mg/dL — ABNORMAL HIGH (ref 65–99)
Glucose-Capillary: 230 mg/dL — ABNORMAL HIGH (ref 65–99)

## 2017-06-12 LAB — BASIC METABOLIC PANEL
ANION GAP: 8 (ref 5–15)
BUN: 23 mg/dL — ABNORMAL HIGH (ref 6–20)
CALCIUM: 8.8 mg/dL — AB (ref 8.9–10.3)
CO2: 23 mmol/L (ref 22–32)
Chloride: 109 mmol/L (ref 101–111)
Creatinine, Ser: 1.71 mg/dL — ABNORMAL HIGH (ref 0.61–1.24)
GFR calc Af Amer: 43 mL/min — ABNORMAL LOW (ref >60)
GFR, EST NON AFRICAN AMERICAN: 37 mL/min — AB (ref >60)
GLUCOSE: 52 mg/dL — AB (ref 65–99)
POTASSIUM: 4 mmol/L (ref 3.5–5.1)
SODIUM: 140 mmol/L (ref 135–145)

## 2017-06-12 LAB — TROPONIN I
TROPONIN I: 0.13 ng/mL — AB (ref <0.03)
TROPONIN I: 0.09 ng/mL — AB (ref <0.03)

## 2017-06-12 MED ORDER — ASPIRIN EC 81 MG PO TBEC
81.0000 mg | DELAYED_RELEASE_TABLET | Freq: Every day | ORAL | Status: DC
  Administered 2017-06-12 – 2017-06-13 (×2): 81 mg via ORAL
  Filled 2017-06-12 (×2): qty 1

## 2017-06-12 MED ORDER — AMLODIPINE BESYLATE 5 MG PO TABS
2.5000 mg | ORAL_TABLET | Freq: Every day | ORAL | Status: DC
  Administered 2017-06-13: 2.5 mg via ORAL
  Filled 2017-06-12: qty 1

## 2017-06-12 NOTE — Progress Notes (Signed)
Chaplain responded to an OR for Advanced Directives for pt. CH met with pt. Pt talked bout his health struggles, his wife health struggles, and was concerned that dcs were still trying to figure out why he had several falls in short period on time. [place] encouraged pt, educated pt on AD. Pt will review AD with his wife and call [place] when ready for completion. [place] informed pt, CH will be available as needed.   06/12/17 1000  Clinical Encounter Type  Visited With Patient  Visit Type Initial;Other (Comment)  Referral From Nurse  Consult/Referral To Chaplain  Spiritual Encounters  Spiritual Needs Brochure;Prayer;Other (Comment)

## 2017-06-12 NOTE — Care Management Obs Status (Signed)
[place] NOTIFICATION   Patient Details  Name: Carlos Levine MRN: [id number] Date of Birth: [birth year]   Medicare Observation Status Notification Given:  Yes  Notice signed, one given to patient and the other to HIM for scanning   Katrina Stack, RN 06/12/2017, 12:06 PM

## 2017-06-12 NOTE — Consult Note (Signed)
[facility] Cardiology Consultation Note  Patient ID: Carlos Levine, MRN: [id number], DOB/AGE: 02-03-1941 76 y.o. Admit date: 06/11/2017   Date of Consult: 06/12/2017 Primary Physician: Glendon Axe, MD Primary Cardiologist: None  Chief Complaint:  Chief Complaint  Patient presents with  . Fall   Reason for Consult: syncope  HPI: 76 y.o. male with known diabetes with complication essential hypertension and chronic low back pain for which the patient has had appropriate medication management. The patient has had appropriate medications as listed below and has been stable until recently when he is had several episodes of falling and/or syncope. This includes several episodes. This includes an episode of for which she was standing at the sink and fell backwards but did not fully lose consciousness. Another time the patient was mowing his lawn on a right on lawnmower and found himself on the ground with severe syncope. Therefore there is concerns that the patient may have a carotid atherosclerosis or other cardiovascular causes including rhythm disturbances. Currently the patient has not had any significant injury from these episodes and there is no current concerns of the chest discomfort or congestive heart failure. Troponin is slightly elevated 0.04 of unknown etiology at this time and will watch very closely  Past Medical History:  Diagnosis Date  . Arthritis   . Cancer [facility])    prostate  . Diabetes mellitus without complication [facility])       Surgical History:  Past Surgical History:  Procedure Laterality Date  . SPINAL FUSION       Home Meds: Prior to Admission medications   Medication Sig Start Date End Date Taking? Authorizing Provider  amLODipine (NORVASC) 5 MG tablet Take 5 mg by mouth daily.   Yes [provider]  bicalutamide (CASODEX) 50 MG tablet Take 50 mg by mouth daily.   Yes [provider]  glimepiride (AMARYL) 4 MG tablet Take 4 mg by mouth  daily with breakfast.   Yes [provider]  pioglitazone (ACTOS) 45 MG tablet Take 45 mg by mouth daily.   Yes [provider]  pravastatin (PRAVACHOL) 80 MG tablet Take 80 mg by mouth daily.   Yes [provider]  baclofen (LIORESAL) 10 MG tablet Take 1 tablet (10 mg total) by mouth 3 (three) times daily. Patient not taking: Reported on 06/11/2017 06/13/16   Sherrie George B, FNP  Lidocaine 4 % PTCH Apply 1 patch topically daily. Patient not taking: Reported on 06/11/2017 06/13/16   Victorino Dike, FNP    Inpatient Medications:  . amLODipine  5 mg Oral Daily  . bicalutamide  50 mg Oral Daily  . glimepiride  4 mg Oral Q breakfast  . heparin  5,000 Units Subcutaneous Q8H  . insulin aspart  0-5 Units Subcutaneous QHS  . insulin aspart  0-9 Units Subcutaneous TID WC  . pioglitazone  45 mg Oral Daily  . pravastatin  80 mg Oral Daily   . sodium chloride 75 mL/hr at 06/11/17 2113    Allergies:  Allergies  Allergen Reactions  . Ivp Dye [Iodinated Diagnostic Agents]     Social History   Social History  . Marital status: Married    Spouse name: N/A  . Number of children: N/A  . Years of education: N/A   Occupational History  . Not on file.   Social History Main Topics  . Smoking status: Former Research scientist (life sciences)  . Smokeless tobacco: Never Used  . Alcohol use No  . Drug use: No  .  Sexual activity: Not on file   Other Topics Concern  . Not on file   Social History Narrative  . No narrative on file     History reviewed. No pertinent family history.   Review of Systems Positive for Syncope Negative for: General:  chills, fever, night sweats or weight changes.  Cardiovascular: PND orthopnea positive for syncope dizziness  Dermatological skin lesions rashes Respiratory: Cough congestion Urologic: Frequent urination urination at night and hematuria Abdominal: negative for nausea, vomiting, diarrhea, bright red blood per rectum, melena, or  hematemesis Neurologic: negative for visual changes, and/or hearing changes  All other systems reviewed and are otherwise negative except as noted above.  Labs:  Recent Labs  06/11/17 1721 06/11/17 2041 06/12/17 0128 06/12/17 0817  TROPONINI 0.17* 0.14* 0.13* 0.09*   Lab Results  Component Value Date   WBC 9.9 06/11/2017   HGB 14.8 06/11/2017   HCT 44.1 06/11/2017   MCV 94.2 06/11/2017   PLT 219 06/11/2017    Recent Labs Lab 06/12/17 0128  NA 140  K 4.0  CL 109  CO2 23  BUN 23*  CREATININE 1.71*  CALCIUM 8.8*  GLUCOSE 52*   No results found for: CHOL, HDL, LDLCALC, TRIG No results found for: DDIMER  Radiology/Studies:  Dg Chest 2 View  Result Date: 06/11/2017 CLINICAL DATA:  Patient with multiple falls.  Syncope. EXAM: CHEST  2 VIEW COMPARISON:  None. FINDINGS: Cardiac contours upper limits of normal. Aortic atherosclerosis. No consolidative pulmonary opacities. No pleural effusion or pneumothorax. Thoracic spine degenerative changes. IMPRESSION: No acute cardiopulmonary process. Electronically Signed   By: Lovey Newcomer M.D.   On: 06/11/2017 18:38   Dg Pelvis 1-2 Views  Result Date: 06/11/2017 CLINICAL DATA:  Multiple falls.  Tonight.  Concern for fracture. EXAM: PELVIS - 1-2 VIEW COMPARISON:  11/09/2006 abdomen radiographs FINDINGS: There is lower lumbar degenerative disc disease of the included lumbar spine from L2 through S1 with mild levoconvex curvature, osteophytes but without evidence of acute fracture. Sclerosis about both SI joints there are again noted which may reflect changes of chronic sacroiliitis or osteoarthritis. Vascular stent projects over the left sacral ala. No pelvic fracture is noted. Overlying bowel partially obscures the lower sacrum and coccyx. The hip joints maintained without acute osseous appearing abnormality. IMPRESSION: 1. Lumbar spondylosis. 2. 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 3. No acute pelvic or  hip fracture is identified. Electronically Signed   By: Ashley Royalty M.D.   On: 06/11/2017 18:41   Ct Head Wo Contrast  Result Date: 06/11/2017 CLINICAL DATA:  Dizziness and falls x4 last evening. EXAM: CT HEAD WITHOUT CONTRAST TECHNIQUE: Contiguous axial images were obtained from the base of the skull through the vertex without intravenous contrast. COMPARISON:  None. FINDINGS: BRAIN: There is sulcal and ventricular prominence consistent with superficial and central atrophy. No intraparenchymal hemorrhage, mass effect nor midline shift. Periventricular and subcortical white matter hypodensities consistent with chronic small vessel ischemic disease are identified. No acute large vascular territory infarcts. No abnormal extra-axial fluid collections. Basal cisterns are not effaced and midline. VASCULAR: Moderate calcific atherosclerosis of the carotid siphons. SKULL: No skull fracture. No significant scalp soft tissue swelling. SINUSES/ORBITS: The mastoid air-cells are clear. The included paranasal sinuses are well-aerated.Postsurgical changes of both globes. Intact orbits bilaterally. OTHER: None. IMPRESSION: Atrophy with chronic appearing small vessel ischemic disease of periventricular and subcortical white matter. No acute intracranial abnormality. Electronically Signed   By: Meredith Leeds.D.  On: 06/11/2017 18:44    EKG: Normal sinus rhythm  Weights: Filed Weights   06/11/17 1713 06/11/17 2100  Weight: 103 kg (227 lb) 103 kg (227 lb 1.6 oz)     Physical Exam: Blood pressure 118/65, pulse 77, temperature 98.1 F (36.7 C), temperature source Oral, resp. rate 18, height 6' (1.829 m), weight 103 kg (227 lb 1.6 oz), SpO2 98 %. Body mass index is 30.8 kg/m. General: Well developed, well nourished, in no acute distress. Head eyes ears nose throat: Normocephalic, atraumatic, sclera non-icteric, no xanthomas, nares are without discharge. No apparent thyromegaly and/or mass  Lungs: Normal respiratory  effort.  no wheezes, no rales, no rhonchi.  Heart: RRR with normal S1 S2.2-3+ right upper sternal border murmur gallop, no rub, PMI is normal size and placement, carotid upstroke normal without bruit, jugular venous pressure is normal Abdomen: Soft, non-tender, non-distended with normoactive bowel sounds. No hepatomegaly. No rebound/guarding. No obvious abdominal masses. Abdominal aorta is normal size without bruit Extremities: No edema. no cyanosis, no clubbing, no ulcers  Peripheral : 2+ bilateral upper extremity pulses, 2+ bilateral femoral pulses, 2+ bilateral dorsal pedal pulse Neuro: Alert and oriented. No facial asymmetry. No focal deficit. Moves all extremities spontaneously. Musculoskeletal: Normal muscle tone without kyphosis Psych:  Responds to questions appropriately with a normal affect.    Assessment: 76 year old male with essential hypertension makes hyperlipidemia diabetes and low back pain with episode of syncope of unknown etiology without current evidence of anginal symptoms heart failure or myocardial infarction  Plan: 1. Carotid Dopplers for further evaluation of carotid atherosclerosis 2. High intensity cholesterol therapy with pravastatin 3. Amlodipine for hypertension control without change 4. Telemetry and ambulation following for rhythm disturbances causing above 5. Further consideration of stress test evaluation either in or outpatient depending on symptoms listed above 6. Further consideration of longer term 30 day monitor for episodes of syncope 7. Echocardiogram for LV systolic dysfunction valvular heart disease contributing to above  Signed, Corey Skains M.D. [facility] 06/12/2017, 11:53 AM

## 2017-06-12 NOTE — Progress Notes (Signed)
[place] [facility] [place] NAME: Carlos Levine    MR#:  [id number]  DATE OF BIRTH:  08-Dec-1940  SUBJECTIVE:   Came in with syncopal episode few yday. Not eating well due to nausea. Found to have low sugars at home and here Feels a lot better now No CP REVIEW OF SYSTEMS:   Review of Systems  Constitutional: Negative for chills, fever and weight loss.  HENT: Negative for ear discharge, ear pain and nosebleeds.   Eyes: Negative for blurred vision, pain and discharge.  Respiratory: Negative for sputum production, shortness of breath, wheezing and stridor.   Cardiovascular: Negative for chest pain, palpitations, orthopnea and PND.  Gastrointestinal: Negative for abdominal pain, diarrhea, nausea and vomiting.  Genitourinary: Negative for frequency and urgency.  Musculoskeletal: Negative for back pain and joint pain.  Neurological: Negative for sensory change, speech change, focal weakness and weakness.  Psychiatric/Behavioral: Negative for depression and hallucinations. The patient is not nervous/anxious.    Tolerating Diet:yes Tolerating PT: rec out pt PT  DRUG ALLERGIES:   Allergies  Allergen Reactions  . Ivp Dye [Iodinated Diagnostic Agents]     VITALS:  Blood pressure 118/65, pulse 77, temperature 98.1 F (36.7 C), temperature source Oral, resp. rate 18, height 6' (1.829 m), weight 103 kg (227 lb 1.6 oz), SpO2 98 %.  PHYSICAL EXAMINATION:   Physical Exam  GENERAL:  76 y.o.-year-old patient lying in the bed with no acute distress.  EYES: Pupils equal, round, reactive to light and accommodation. No scleral icterus. Extraocular muscles intact.  HEENT: Head atraumatic, normocephalic. Oropharynx and nasopharynx clear.  NECK:  Supple, no jugular venous distention. No thyroid enlargement, no tenderness.  LUNGS: Normal breath sounds bilaterally, no wheezing, rales, rhonchi. No use of accessory muscles of respiration.  CARDIOVASCULAR:  S1, S2 normal. No murmurs, rubs, or gallops.  ABDOMEN: Soft, nontender, nondistended. Bowel sounds present. No organomegaly or mass.  EXTREMITIES: No cyanosis, clubbing or edema b/l.    NEUROLOGIC: Cranial nerves II through XII are intact. No focal Motor or sensory deficits b/l.   PSYCHIATRIC:  patient is alert and oriented x 3.  SKIN: No obvious rash, lesion, or ulcer.   LABORATORY PANEL:  CBC  Recent Labs Lab 06/11/17 1721  WBC 9.9  HGB 14.8  HCT 44.1  PLT 219    Chemistries   Recent Labs Lab 06/12/17 0128  NA 140  K 4.0  CL 109  CO2 23  GLUCOSE 52*  BUN 23*  CREATININE 1.71*  CALCIUM 8.8*   Cardiac Enzymes  Recent Labs Lab 06/12/17 0817  TROPONINI 0.09*   RADIOLOGY:  Dg Chest 2 View  Result Date: 06/11/2017 CLINICAL DATA:  Patient with multiple falls.  Syncope. EXAM: CHEST  2 VIEW COMPARISON:  None. FINDINGS: Cardiac contours upper limits of normal. Aortic atherosclerosis. No consolidative pulmonary opacities. No pleural effusion or pneumothorax. Thoracic spine degenerative changes. IMPRESSION: No acute cardiopulmonary process. Electronically Signed   By: Lovey Newcomer M.D.   On: 06/11/2017 18:38   Dg Pelvis 1-2 Views  Result Date: 06/11/2017 CLINICAL DATA:  Multiple falls.  Tonight.  Concern for fracture. EXAM: PELVIS - 1-2 VIEW COMPARISON:  11/09/2006 abdomen radiographs FINDINGS: There is lower lumbar degenerative disc disease of the included lumbar spine from L2 through S1 with mild levoconvex curvature, osteophytes but without evidence of acute fracture. Sclerosis about both SI joints there are again noted which may reflect changes of chronic sacroiliitis or osteoarthritis. Vascular stent projects over  the left sacral ala. No pelvic fracture is noted. Overlying bowel partially obscures the lower sacrum and coccyx. The hip joints maintained without acute osseous appearing abnormality. IMPRESSION: 1. Lumbar spondylosis. 2. Osteoarthritis or changes of chronic  sacroiliitis involving both SI joints. 3. No acute pelvic or hip fracture is identified. Electronically Signed   By: Ashley Royalty M.D.   On: 06/11/2017 18:41   Ct Head Wo Contrast  Result Date: 06/11/2017 CLINICAL DATA:  Dizziness and falls x4 last evening. EXAM: CT HEAD WITHOUT CONTRAST TECHNIQUE: Contiguous axial images were obtained from the base of the skull through the vertex without intravenous contrast. COMPARISON:  None. FINDINGS: BRAIN: There is sulcal and ventricular prominence consistent with superficial and central atrophy. No intraparenchymal hemorrhage, mass effect nor midline shift. Periventricular and subcortical white matter hypodensities consistent with chronic small vessel ischemic disease are identified. No acute large vascular territory infarcts. No abnormal extra-axial fluid collections. Basal cisterns are not effaced and midline. VASCULAR: Moderate calcific atherosclerosis of the carotid siphons. SKULL: No skull fracture. No significant scalp soft tissue swelling. SINUSES/ORBITS: The mastoid air-cells are clear. The included paranasal sinuses are well-aerated.Postsurgical changes of both globes. Intact orbits bilaterally. OTHER: None. IMPRESSION: Atrophy with chronic appearing small vessel ischemic disease of periventricular and subcortical white matter. No acute intracranial abnormality. Electronically Signed   By: Ashley Royalty M.D.   On: 06/11/2017 18:44   ASSESSMENT AND PLAN:   76 year old male who's had a history.  of falling multiple times over the past few days. He said while mowing his grass or riding lawn more that he passed out.  1. Syncopal episodes suspected due to Hypoglycemia, recurrent due to poor po intake and continued use of Oral meds(diabetic) -sugars better -was found hypoglycemic at home and here -now better -orthostatics stable -hold DM meds  2/. Mild elevated troponin suspected demand ischemia -seen by cardiology---no new recommendations -no  Cp -asa  3.DM-2 hold oral meds  4.DVT prophylaxis lovenox  Case discussed with Care Management/Social Worker. Management plans discussed with the patient, family and they are in agreement.  CODE STATUS: full    TOTAL TIME TAKING CARE OF THIS PATIENT: *25* minutes.  >50% time spent on counselling and coordination of care  POSSIBLE D/C IN *1-2* DAYS, DEPENDING ON CLINICAL CONDITION.  Note: This dictation was prepared with Dragon dictation along with smaller phrase technology. Any transcriptional errors that result from this process are unintentional.  Shaleah Nissley M.D on 06/12/2017 at 4:29 PM  Between 7am to 6pm - Pager - [phone]  After 6pm go to [email or web address] - password [email or web address]  Office  [phone]  CC: Primary care physician; Glendon Axe, MD

## 2017-06-12 NOTE — Progress Notes (Signed)
*  PRELIMINARY RESULTS* Echocardiogram 2D Echocardiogram has been performed.  Sherrie Sport 06/12/2017, 9:02 AM

## 2017-06-12 NOTE — Evaluation (Signed)
Physical Therapy Evaluation Patient Details Name: REGINA COPPOLINO MRN: [id number] DOB: 07/02/1941 Today's Date: 06/12/2017   History of Present Illness  Pt is a 76 yo M admitted to acute care on 8/23 for elevated troponins. Prior to admission, pt modI in all ADL's requiring no AD, but reports furniture walking. PMH: arthritis, cancer, diabetes mellitus, and stage III chronic renal failure.   Clinical Impression  Pt is pleasant and willing to participate for return to PLOF. Pt with chronic low back pain, stating he can't walk very far; reports intermittent furniture walking. Pt performs bed mobility and tranfers at ModI, and ambulation with CGA, due to impaired strength, endurance, and balance. Pt amb with B UE support with RW, and reported feeling as if he could amb further with the RW. Pt performed the 5x STS in 13.76 sec, indicating impaired power and strength to B LE's. Overall, pt responded well to today's treatment with no adverse affects. Pt would benefit from skilled PT to address the previously mentioned impairments and promote return to PLOF. Currently recommending outpatient PT, pending d/c. This entire session was guided, instructed, and directly supervised by Greggory Stallion, [name].      Follow Up Recommendations Outpatient PT    Equipment Recommendations  Rolling walker with 5" wheels    Recommendations for Other Services       Precautions / Restrictions Precautions Precautions: Fall Restrictions Weight Bearing Restrictions: No      Mobility  Bed Mobility Overal bed mobility: Modified Independent             General bed mobility comments: Requires increased time and use of bed rails, but required no cues for mechancsi and safety. No reports of dizziness upon sitting.   Transfers Overall transfer level: Modified independent Equipment used: Rolling walker (2 wheeled)             General transfer comment: Pt ModI with STS transfer, using RW. Requires ncreased time  and min cues for transfering safely with RW.   Ambulation/Gait Ambulation/Gait assistance: Min guard Ambulation Distance (Feet): 250 Feet Assistive device: Rolling walker (2 wheeled) Gait Pattern/deviations: Step-through pattern     General Gait Details: Pt with good reciprocal gait pattern, though required education on safety and mechanics with RW: relaxing shoulders, standing erect, and staying close to RW. Pt required multiple cues to maintain these mechanics.   Stairs            Wheelchair Mobility    Modified Rankin (Stroke Patients Only)       Balance Overall balance assessment: Needs assistance Sitting-balance support: Bilateral upper extremity supported;Feet supported Sitting balance-Leahy Scale: Normal Sitting balance - Comments: Pt with normal sitting balance, requiring no cues for safety and mechancis.    Standing balance support: Bilateral upper extremity supported Standing balance-Leahy Scale: Fair Standing balance comment: Fair standing balance, using B UE support from RW. Slight icreased in postural sway; able to self-correct.                             Pertinent Vitals/Pain Pain Assessment: Faces Faces Pain Scale: Hurts a little bit Pain Location: chronic low back pain Pain Descriptors / Indicators: Discomfort Pain Intervention(s): Limited activity within patient's tolerance;Monitored during session    Home Living Family/patient expects to be discharged to:: Private residence Living Arrangements: Spouse/significant other Available Help at Discharge: Family Type of Home: Mobile home Home Access: Stairs to enter Entrance Stairs-Rails: Right;Left;Can reach both Entrance [employer]  of Steps: 10 Home Layout: One level Home Equipment: None      Prior Function Level of Independence: Independent with assistive device(s)         Comments: Pt does not use formal AD, though has attempted using his wife's RW over the past few days, as  he has had 4 falls in one night due to dizziness. Pt reports furniture walking. Pt still drives, but says he does not amb long distances in the community. His children assist in getting groceries.      Hand Dominance        Extremity/Trunk Assessment   Upper Extremity Assessment Upper Extremity Assessment: Overall WFL for tasks assessed    Lower Extremity Assessment Lower Extremity Assessment: Generalized weakness (MMT to B LE's grossly 4+/5)       Communication   Communication: No difficulties  Cognition Arousal/Alertness: Awake/alert Behavior During Therapy: WFL for tasks assessed/performed Overall Cognitive Status: Within Functional Limits for tasks assessed                                 General Comments: Talkative.       General Comments      Exercises Other Exercises Other Exercises: Supine therex performed to B LE's with supervision x10 reps: ankle pumps, SLR, and hip abd. Good mechancis and safety.    Assessment/Plan    PT Assessment Patient needs continued PT services  PT Problem List Decreased strength;Decreased activity tolerance;Decreased balance;Decreased mobility;Decreased coordination;Decreased safety awareness       PT Treatment Interventions DME instruction;Gait training;Stair training;Functional mobility training;Therapeutic exercise;Therapeutic activities;Balance training;Neuromuscular re-education;Patient/family education;Manual techniques    PT Goals (Current goals can be found in the Care Plan section)  Acute Rehab PT Goals Patient Stated Goal: to go home PT Goal Formulation: With patient Time For Goal Achievement: 06/26/17 Potential to Achieve Goals: Good    Frequency Min 2X/week   Barriers to discharge        Co-evaluation               AM-PAC PT "6 Clicks" Daily Activity  Outcome Measure Difficulty turning over in bed (including adjusting bedclothes, sheets and blankets)?: Unable Difficulty moving from lying  on back to sitting on the side of the bed? : Unable Difficulty sitting down on and standing up from a chair with arms (e.g., wheelchair, bedside commode, etc,.)?: Unable Help needed moving to and from a bed to chair (including a wheelchair)?: A Little Help needed walking in hospital room?: A Little Help needed climbing 3-5 steps with a railing? : A Little 6 Click Score: 12    End of Session Equipment Utilized During Treatment: Gait belt Activity Tolerance: Patient tolerated treatment well Patient left: in chair;with call bell/phone within reach;with chair alarm set Nurse Communication: Mobility status PT Visit Diagnosis: Unsteadiness on feet (R26.81);Repeated falls (R29.6);History of falling (Z91.81);Muscle weakness (generalized) (M62.81)    Time: 3338-3291 PT Time Calculation (min) (ACUTE ONLY): 26 min   Charges:         PT G Codes:        Oran Rein PT, SPT  Bevelyn Ngo 06/12/2017, 1:56 PM

## 2017-06-12 NOTE — Progress Notes (Signed)
Inpatient Diabetes Program Recommendations  AACE/ADA: New Consensus Statement on Inpatient Glycemic Control (2015)  Target Ranges:  Prepandial:   less than 140 mg/dL      Peak postprandial:   less than 180 mg/dL (1-2 hours)      Critically ill patients:  140 - 180 mg/dL    Results for ALEKSANDR, PELLOW (MRN [id number]) as of 06/12/2017 09:53  Ref. Range 06/11/2017 20:05 06/11/2017 20:37 06/11/2017 21:11 06/12/2017 07:53 06/12/2017 08:51  Glucose-Capillary Latest Ref Range: 65 - 99 mg/dL 57 (L) 206 (H) 208 (H) 60 (L) 158 (H)    Admit with: Syncope/ Elevated Troponin  History: DM, CKD  Home DM Meds: Actos 45 mg daily       Amaryl 4 mg daily  Current Insulin Orders: Actos 45 mg daily                 Amaryl 4 mg daily      Novolog Sensitive Correction Scale/ SSI (0-9 units) TID AC + HS       MD- Note patient with Hypoglycemia yesterday at 8pm (upon arrival to ED).  Hypoglycemic again this AM: CBG 60 mg/dl.  Per records, patient took both his Amaryl and Actos yesterday AM (08/23).  Please consider discontinuing Actos and Amaryl for now while in hospital and manage with Novolog SSI alone at this time.  Since patient has Chronic Kidney Disease, may need reduction of Amaryl at time of discharge?  Please consider checking current Hemoglobin A1c level.      --Will follow patient during hospitalization--  Wyn Quaker RN, MSN, CDE Diabetes Coordinator Inpatient Glycemic Control Team Team Pager: [phone] (8a-5p)

## 2017-06-12 NOTE — Care Management (Signed)
Placed in observation from home for weakness and elevated troponin. No discharge needs identified by members of the care team at present

## 2017-06-13 LAB — ECHOCARDIOGRAM COMPLETE
Height: 72 in
Weight: 3633.6 oz

## 2017-06-13 LAB — GLUCOSE, CAPILLARY
Glucose-Capillary: 106 mg/dL — ABNORMAL HIGH (ref 65–99)
Glucose-Capillary: 143 mg/dL — ABNORMAL HIGH (ref 65–99)

## 2017-06-13 MED ORDER — ASPIRIN 81 MG PO TBEC: 81.0000 mg | DELAYED_RELEASE_TABLET | Freq: Every day | ORAL | 0 refills | Status: DC

## 2017-06-13 NOTE — Discharge Summary (Signed)
[facility] [place] NAME: Carlos Levine    MR#:  [id number]  DATE OF BIRTH:  May 27, 1941  DATE OF ADMISSION:  06/11/2017 ADMITTING PHYSICIAN: Baxter Hire, MD  DATE OF DISCHARGE: 06/13/2017  PRIMARY CARE PHYSICIAN: Glendon Axe, MD    ADMISSION DIAGNOSIS:  Syncope and collapse [R55] Elevated troponin I level [R74.8]  DISCHARGE DIAGNOSIS:  Syncope suspected due to hypoglycemia recurrent now resolved History of diabetes Hypertension SECONDARY DIAGNOSIS:   Past Medical History:  Diagnosis Date  . Arthritis   . Cancer [facility])    prostate  . Diabetes mellitus without complication [facility])     HOSPITAL COURSE:   76 year old male who's had a history.  of falling multiple times over the past few days. He said while mowing his grass or riding lawn more that he passed out.  1. Syncopal episodes suspected due to Hypoglycemia, recurrent due to poor po intake and continued use of Oral meds(diabetic) -sugars better -was found hypoglycemic at home and here -now better -orthostatics stable -hold pioglitazone for now and resume glipizide  2/. Mild elevated troponin suspected demand ischemia -seen by cardiology---no new recommendations -no Cp -asa -Echo nothing acute  3.DM-2 hold oral meds  4.DVT prophylaxis lovenox  PT recommended outpatient PT.  Discharge plan discussed with patient. He will continue to HIS blood sugars at home for Dr. Candiss Norse  Physician to review. CONSULTS OBTAINED:  Treatment Team:  Corey Skains, MD  DRUG ALLERGIES:   Allergies  Allergen Reactions  . Ivp Dye [Iodinated Diagnostic Agents]     DISCHARGE MEDICATIONS:   Current Discharge Medication List    START taking these medications   Details  aspirin EC 81 MG EC tablet Take 1 tablet (81 mg total) by mouth daily. Qty: 30 tablet, Refills: 0      CONTINUE these medications which have NOT CHANGED   Details  amLODipine (NORVASC) 5  MG tablet Take 5 mg by mouth daily.    bicalutamide (CASODEX) 50 MG tablet Take 50 mg by mouth daily.    glimepiride (AMARYL) 4 MG tablet Take 4 mg by mouth daily with breakfast.    pravastatin (PRAVACHOL) 80 MG tablet Take 80 mg by mouth daily.    baclofen (LIORESAL) 10 MG tablet Take 1 tablet (10 mg total) by mouth 3 (three) times daily. Qty: 30 tablet, Refills: 0    Lidocaine 4 % PTCH Apply 1 patch topically daily. Qty: 15 patch, Refills: 0      STOP taking these medications     pioglitazone (ACTOS) 45 MG tablet         If you experience worsening of your admission symptoms, develop shortness of breath, life threatening emergency, suicidal or homicidal thoughts you must seek medical attention immediately by calling 911 or calling your MD immediately  if symptoms less severe.  You Must read complete instructions/literature along with all the possible adverse reactions/side effects for all the Medicines you take and that have been prescribed to you. Take any new Medicines after you have completely understood and accept all the possible adverse reactions/side effects.   Please note  You were cared for by a hospitalist during your hospital stay. If you have any questions about your discharge medications or the care you received while you were in the hospital after you are discharged, you can call the unit and asked to speak with the hospitalist on call if the hospitalist that took care of you is not available.  Once you are discharged, your primary care physician will handle any further medical issues. Please note that NO REFILLS for any discharge medications will be authorized once you are discharged, as it is imperative that you return to your primary care physician (or establish a relationship with a primary care physician if you do not have one) for your aftercare needs so that they can reassess your need for medications and monitor your lab values. Today   SUBJECTIVE    Overall  doing well eating well VITAL SIGNS:  Blood pressure 118/62, pulse 84, temperature 97.6 F (36.4 C), temperature source Oral, resp. rate 18, height 6' (1.829 m), weight 103 kg (227 lb 1.6 oz), SpO2 97 %.  I/O:   Intake/Output Summary (Last 24 hours) at 06/13/17 1110 Last data filed at 06/13/17 1108  Gross per 24 hour  Intake              840 ml  Output             1710 ml  Net             -870 ml    PHYSICAL EXAMINATION:  GENERAL:  76 y.o.-year-old patient lying in the bed with no acute distress.  EYES: Pupils equal, round, reactive to light and accommodation. No scleral icterus. Extraocular muscles intact.  HEENT: Head atraumatic, normocephalic. Oropharynx and nasopharynx clear.  NECK:  Supple, no jugular venous distention. No thyroid enlargement, no tenderness.  LUNGS: Normal breath sounds bilaterally, no wheezing, rales,rhonchi or crepitation. No use of accessory muscles of respiration.  CARDIOVASCULAR: S1, S2 normal. No murmurs, rubs, or gallops.  ABDOMEN: Soft, non-tender, non-distended. Bowel sounds present. No organomegaly or mass.  EXTREMITIES: No pedal edema, cyanosis, or clubbing.  NEUROLOGIC: Cranial nerves II through XII are intact. Muscle strength 5/5 in all extremities. Sensation intact. Gait not checked.  PSYCHIATRIC: The patient is alert and oriented x 3.  SKIN: No obvious rash, lesion, or ulcer.   DATA REVIEW:   CBC   Recent Labs Lab 06/11/17 1721  WBC 9.9  HGB 14.8  HCT 44.1  PLT 219    Chemistries   Recent Labs Lab 06/12/17 0128  NA 140  K 4.0  CL 109  CO2 23  GLUCOSE 52*  BUN 23*  CREATININE 1.71*  CALCIUM 8.8*    Microbiology Results   No results found for this or any previous visit (from the past 240 hour(s)).  RADIOLOGY:  Dg Chest 2 View  Result Date: 06/11/2017 CLINICAL DATA:  Patient with multiple falls.  Syncope. EXAM: CHEST  2 VIEW COMPARISON:  None. FINDINGS: Cardiac contours upper limits of normal. Aortic atherosclerosis. No  consolidative pulmonary opacities. No pleural effusion or pneumothorax. Thoracic spine degenerative changes. IMPRESSION: No acute cardiopulmonary process. Electronically Signed   By: Lovey Newcomer M.D.   On: 06/11/2017 18:38   Dg Pelvis 1-2 Views  Result Date: 06/11/2017 CLINICAL DATA:  Multiple falls.  Tonight.  Concern for fracture. EXAM: PELVIS - 1-2 VIEW COMPARISON:  11/09/2006 abdomen radiographs FINDINGS: There is lower lumbar degenerative disc disease of the included lumbar spine from L2 through S1 with mild levoconvex curvature, osteophytes but without evidence of acute fracture. Sclerosis about both SI joints there are again noted which may reflect changes of chronic sacroiliitis or osteoarthritis. Vascular stent projects over the left sacral ala. No pelvic fracture is noted. Overlying bowel partially obscures the lower sacrum and coccyx. The hip joints maintained without acute osseous appearing abnormality. IMPRESSION: 1. Lumbar spondylosis.  2. 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 3. No acute pelvic or hip fracture is identified. Electronically Signed   By: Ashley Royalty M.D.   On: 06/11/2017 18:41   Ct Head Wo Contrast  Result Date: 06/11/2017 CLINICAL DATA:  Dizziness and falls x4 last evening. EXAM: CT HEAD WITHOUT CONTRAST TECHNIQUE: Contiguous axial images were obtained from the base of the skull through the vertex without intravenous contrast. COMPARISON:  None. FINDINGS: BRAIN: There is sulcal and ventricular prominence consistent with superficial and central atrophy. No intraparenchymal hemorrhage, mass effect nor midline shift. Periventricular and subcortical white matter hypodensities consistent with chronic small vessel ischemic disease are identified. No acute large vascular territory infarcts. No abnormal extra-axial fluid collections. Basal cisterns are not effaced and midline. VASCULAR: Moderate calcific atherosclerosis of the carotid siphons.  SKULL: No skull fracture. No significant scalp soft tissue swelling. SINUSES/ORBITS: The mastoid air-cells are clear. The included paranasal sinuses are well-aerated.Postsurgical changes of both globes. Intact orbits bilaterally. OTHER: None. IMPRESSION: Atrophy with chronic appearing small vessel ischemic disease of periventricular and subcortical white matter. No acute intracranial abnormality. Electronically Signed   By: Ashley Royalty M.D.   On: 06/11/2017 18:44     Management plans discussed with the patient, family and they are in agreement.  CODE STATUS:     Code Status Orders        Start     Ordered   06/11/17 1938  Full code  Continuous     06/11/17 1937    Code Status History    Date Active Date Inactive Code Status Order ID Comments User Context   This patient has a current code status but no historical code status.      TOTAL TIME TAKING CARE OF THIS PATIENT: 40 minutes.    Halley Kincer M.D on 06/13/2017 at 11:10 AM  Between 7am to 6pm - Pager - [phone] After 6pm go to [email or web address] - password [email or web address] [facility]  Office  [phone]  CC: Primary care physician; Glendon Axe, MD

## 2017-06-13 NOTE — Clinical Social Work Note (Signed)
CSW received verbal consult for possible resource needs for this patient. CSW spoke with patient via telephone and alerted the patient to contact [facility] for transportation and eldercare needs. CSW has sent the attending RN a resource list to give to the patient. CSW is signing off.  Santiago Bumpers, MSW, Latanya Presser [phone]

## 2017-06-13 NOTE — Progress Notes (Signed)
Reviewed discharge instructions and education with pt. IV & telemetry removed. Belongings gathered and pt wheeled out to meet his ride.

## 2017-06-13 NOTE — Care Management Note (Signed)
Case Management Note  Patient Details  Name: BREYLIN DOM MRN: [id number] Date of Birth: 12-03-1940  Subjective/Objective:   Mr Schinke nurse found an order for outpatient PT for Mr Umstead in his chart after Mr Westberg had left the building. This Probation officer suggested that the order be faxed to Mr Tingles PCP, Dr Delana Meyer Singh's office fax.                  Action/Plan:   Expected Discharge Date:  06/13/17               Expected Discharge Plan:     In-House Referral:     Discharge planning Services     Post Acute Care Choice:    Choice offered to:     DME Arranged:    DME Agency:     HH Arranged:    HH Agency:     Status of Service:     If discussed at [employer], dates discussed:    Additional Comments:  Daylin Eads A, RN 06/13/2017, 1:43 PM

## 2017-06-13 NOTE — Progress Notes (Signed)
[facility] Encounter Note  Patient: Carlos Levine / Admit Date: 06/11/2017 / Date of Encounter: 06/13/2017, 6:55 AM   Subjective: Patient overall improved. No evidence of telemetry changes causing syncope. Patient does have issues with glucose control.  Review of Systems: Positive for: Syncope Negative for: Vision change, hearing change,   dizziness, nausea, vomiting,diarrhea, bloody stool, stomach pain, cough, congestion, diaphoresis, urinary frequency, urinary pain,skin lesions, skin rashes Others previously listed  Objective: Telemetry: Normal sinus rhythm Physical Exam: Blood pressure (!) 137/53, pulse 72, temperature 98.4 F (36.9 C), temperature source Oral, resp. rate 18, height 6' (1.829 m), weight 103 kg (227 lb 1.6 oz), SpO2 98 %. Body mass index is 30.8 kg/m. General: Well developed, well nourished, in no acute distress. Head: Normocephalic, atraumatic, sclera non-icteric, no xanthomas, nares are without discharge. Neck: No apparent masses Lungs: Normal respirations with no wheezes, no rhonchi, no rales , no crackles   Heart: Regular rate and rhythm, normal S1 S2, no murmur, no rub, no gallop, PMI is normal size and placement, carotid upstroke normal without bruit, jugular venous pressure normal Abdomen: Soft, non-tender, non-distended with normoactive bowel sounds. No hepatosplenomegaly. Abdominal aorta is normal size without bruit Extremities: No edema, no clubbing, no cyanosis, no ulcers,  Peripheral: 2+ radial, 2+ femoral, 2+ dorsal pedal pulses Neuro: Alert and oriented. Moves all extremities spontaneously. Psych:  Responds to questions appropriately with a normal affect.   Intake/Output Summary (Last 24 hours) at 06/13/17 0655 Last data filed at 06/13/17 0230  Gross per 24 hour  Intake              720 ml  Output             1660 ml  Net             -940 ml    Inpatient Medications:  . amLODipine  2.5 mg Oral Daily  . aspirin EC  81 mg  Oral Daily  . bicalutamide  50 mg Oral Daily  . heparin  5,000 Units Subcutaneous Q8H  . insulin aspart  0-5 Units Subcutaneous QHS  . insulin aspart  0-9 Units Subcutaneous TID WC  . pravastatin  80 mg Oral Daily   Infusions:   Labs:  Recent Labs  06/11/17 1721 06/12/17 0128  NA 139 140  K 4.3 4.0  CL 107 109  CO2 22 23  GLUCOSE 49* 52*  BUN 22* 23*  CREATININE 1.78* 1.71*  CALCIUM 9.5 8.8*   No results for input(s): AST, ALT, ALKPHOS, BILITOT, PROT, ALBUMIN in the last 72 hours.  Recent Labs  06/11/17 1721  WBC 9.9  HGB 14.8  HCT 44.1  MCV 94.2  PLT 219    Recent Labs  06/11/17 1721 06/11/17 2041 06/12/17 0128 06/12/17 0817  TROPONINI 0.17* 0.14* 0.13* 0.09*   Invalid input(s): POCBNP No results for input(s): HGBA1C in the last 72 hours.   Weights: Filed Weights   06/11/17 1713 06/11/17 2100  Weight: 103 kg (227 lb) 103 kg (227 lb 1.6 oz)     Radiology/Studies:  Dg Chest 2 View  Result Date: 06/11/2017 CLINICAL DATA:  Patient with multiple falls.  Syncope. EXAM: CHEST  2 VIEW COMPARISON:  None. FINDINGS: Cardiac contours upper limits of normal. Aortic atherosclerosis. No consolidative pulmonary opacities. No pleural effusion or pneumothorax. Thoracic spine degenerative changes. IMPRESSION: No acute cardiopulmonary process. Electronically Signed   By: Lovey Newcomer M.D.   On: 06/11/2017 18:38   Dg Pelvis 1-2 Views  Result Date: 06/11/2017 CLINICAL DATA:  Multiple falls.  Tonight.  Concern for fracture. EXAM: PELVIS - 1-2 VIEW COMPARISON:  11/09/2006 abdomen radiographs FINDINGS: There is lower lumbar degenerative disc disease of the included lumbar spine from L2 through S1 with mild levoconvex curvature, osteophytes but without evidence of acute fracture. Sclerosis about both SI joints there are again noted which may reflect changes of chronic sacroiliitis or osteoarthritis. Vascular stent projects over the left sacral ala. No pelvic fracture is noted.  Overlying bowel partially obscures the lower sacrum and coccyx. The hip joints maintained without acute osseous appearing abnormality. IMPRESSION: 1. Lumbar spondylosis. 2. 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 3. No acute pelvic or hip fracture is identified. Electronically Signed   By: Ashley Royalty M.D.   On: 06/11/2017 18:41   Ct Head Wo Contrast  Result Date: 06/11/2017 CLINICAL DATA:  Dizziness and falls x4 last evening. EXAM: CT HEAD WITHOUT CONTRAST TECHNIQUE: Contiguous axial images were obtained from the base of the skull through the vertex without intravenous contrast. COMPARISON:  None. FINDINGS: BRAIN: There is sulcal and ventricular prominence consistent with superficial and central atrophy. No intraparenchymal hemorrhage, mass effect nor midline shift. Periventricular and subcortical white matter hypodensities consistent with chronic small vessel ischemic disease are identified. No acute large vascular territory infarcts. No abnormal extra-axial fluid collections. Basal cisterns are not effaced and midline. VASCULAR: Moderate calcific atherosclerosis of the carotid siphons. SKULL: No skull fracture. No significant scalp soft tissue swelling. SINUSES/ORBITS: The mastoid air-cells are clear. The included paranasal sinuses are well-aerated.Postsurgical changes of both globes. Intact orbits bilaterally. OTHER: None. IMPRESSION: Atrophy with chronic appearing small vessel ischemic disease of periventricular and subcortical white matter. No acute intracranial abnormality. Electronically Signed   By: Ashley Royalty M.D.   On: 06/11/2017 18:44     Assessment and Recommendation  76 y.o. male with diabetes with complications essential hypertension mixed hyperlipidemia chronic back pain with episodes of syncope and falling over of unknown primary etiology although consistent with possible hypoglycemia and no current evidence of myocardial infarction congestive heart  failure or rhythm disturbances 1. Continue antihypertensive medication management including amlodipine watching for concerns of side effects 2. High intensity cholesterol therapy with pravastatin 3. Aspirin for further risk reduction in cardiovascular event 4. Ambulate and follow for improvements of symptoms and possible discharged home with follow-up in office in one to 2 weeks for further concerns of cardiac causes of syncope if recurrence  Signed, Serafina Royals M.D. FACC

## 2017-11-19 DIAGNOSIS — E875 Hyperkalemia: Secondary | ICD-10-CM | POA: Insufficient documentation

## 2017-11-19 DIAGNOSIS — K5909 Other constipation: Secondary | ICD-10-CM | POA: Insufficient documentation

## 2017-11-19 HISTORY — DX: Hyperkalemia: E87.5

## 2018-02-21 DIAGNOSIS — M545 Low back pain: Secondary | ICD-10-CM

## 2018-02-21 DIAGNOSIS — G8929 Other chronic pain: Secondary | ICD-10-CM | POA: Insufficient documentation

## 2018-11-05 ENCOUNTER — Ambulatory Visit: Payer: Medicare Other | Admitting: Urology

## 2018-11-05 ENCOUNTER — Encounter: Payer: Self-pay | Admitting: Urology

## 2018-11-05 VITALS — BP 122/71 | HR 112 | Ht 72.0 in | Wt 221.9 lb

## 2018-11-05 DIAGNOSIS — C61 Malignant neoplasm of prostate: Secondary | ICD-10-CM | POA: Diagnosis not present

## 2018-11-05 NOTE — Progress Notes (Signed)
11/05/2018 12:58 PM   Carlos Levine 09-27-1941 [id number]  Referring provider: Glendon Axe, MD [facility] [place], [place] [address]  Chief Complaint  Patient presents with  . Prostate Cancer   Urologic history: 1.  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  Urologic history as above.  He last saw Dr. [name] in April 2018.  He remains on Casodex.  He has no bothersome lower urinary tract symptoms.  Denies dysuria or gross hematuria.  Denies flank, abdominal, pelvic or scrotal pain.   PMH: Past Medical History:  Diagnosis Date  . Arthritis   . Cancer [facility])    prostate  . Diabetes mellitus without complication ([place])   . Hyperlipidemia   . Prostate cancer [facility])     Surgical History: Past Surgical History:  Procedure Laterality Date  . PROSTATE CRYOABLATION    . SPINAL FUSION      Home Medications:  Allergies as of 11/05/2018      Reactions   Iodinated Diagnostic Agents Hives, Rash   Ace Inhibitors    Other reaction(s): Unknown Other reaction(s): Unknown      Medication List       Accurate as of November 05, 2018 12:58 PM. Always use your most recent med list.        bicalutamide 50 MG tablet Commonly known as:  CASODEX Take 50 mg by mouth daily.   docusate sodium 100 MG capsule Commonly known as:  COLACE TAKE 1 CAPSULE (100 MG TOTAL) BY MOUTH 2 (TWO) TIMES DAILY.   gabapentin 100 MG capsule Commonly known as:  NEURONTIN Take by mouth.   ONE TOUCH ULTRA 2 w/Device Kit Use as instructed.   ONE TOUCH ULTRA TEST test strip Generic drug:  glucose blood TEST SUGAR (TWO) TIMES DAILY.   traMADol-acetaminophen 37.5-325 MG  tablet Commonly known as:  ULTRACET Take 1 tablet by mouth every 8 (eight) hours as needed. for pain       Allergies:  Allergies  Allergen Reactions  . Iodinated Diagnostic Agents Hives and Rash  . Ace Inhibitors     Other reaction(s): Unknown Other reaction(s): Unknown     Family History: Family History  Problem Relation Age of Onset  . Lung cancer Father     Social History:  reports that he has quit smoking. His smoking use included cigarettes. He quit after 30.00 years of use. He has never used smokeless tobacco. He reports that he does not drink alcohol or use drugs.  ROS: UROLOGY Frequent Urination?: Yes Hard to postpone urination?: Yes Burning/pain with urination?: No Get up at night to urinate?: Yes Leakage of urine?: No Urine stream starts and stops?: No Trouble starting stream?: No Do you have to strain to urinate?: No Blood in urine?: No Urinary tract infection?: No Sexually transmitted disease?: No Injury to kidneys or bladder?: No Painful intercourse?: No Weak stream?: No Erection problems?: No Penile pain?: No  Gastrointestinal Nausea?: Yes Vomiting?: Yes Indigestion/heartburn?: No Diarrhea?: No Constipation?: Yes  Constitutional Fever: No Night sweats?: No Weight loss?: No Fatigue?: No  Skin Skin rash/lesions?: No Itching?: No  Eyes Blurred vision?: No Double vision?: No  Ears/Nose/Throat Sore throat?: No Sinus problems?: Yes  Hematologic/Lymphatic Swollen glands?:  No Easy bruising?: Yes  Cardiovascular Leg swelling?: No Chest pain?: No  Respiratory Cough?: Yes Shortness of breath?: Yes  Endocrine Excessive thirst?: Yes  Musculoskeletal Back pain?: Yes Joint pain?: Yes  Neurological Headaches?: No Dizziness?: Yes  Psychologic Depression?: No Anxiety?: No  Physical Exam: BP 122/71 (BP Location: Left Arm, Patient Position: Sitting, Cuff Size: Normal)   Pulse (!) 112   Ht 6' (1.829 m)   Wt 221 lb 14.4 oz  (100.7 kg)   BMI 30.10 kg/m   Constitutional:  Alert and oriented, No acute distress. HEENT: [place] AT, moist mucus membranes.  Trachea midline, no masses. Cardiovascular: No clubbing, cyanosis, or edema. Respiratory: Normal respiratory effort, no increased work of breathing. Neurologic: Grossly intact, no focal deficits, moving all 4 extremities. Psychiatric: Normal mood and affect.   Assessment & Plan:   78 year old male with prostate cancer initially treated with cryoablation.  He was started on bicalutamide for biochemical recurrence.  His PSA has been undetectable since 2016.  Bicalutamide was refilled.  PSA was drawn today.  Follow-up 6 months.      Abbie Sons, [place] [address], [place], [place] [address] [phone]

## 2018-11-06 LAB — PSA

## 2018-11-08 ENCOUNTER — Telehealth: Payer: Self-pay

## 2018-11-08 NOTE — Telephone Encounter (Signed)
Patient notified

## 2018-11-08 NOTE — Telephone Encounter (Signed)
-----   Message from Abbie Sons, MD sent at 11/07/2018  9:40 AM EST ----- PSA remains undetectable at <0.1

## 2018-11-10 ENCOUNTER — Telehealth: Payer: Self-pay | Admitting: Urology

## 2018-11-10 MED ORDER — BICALUTAMIDE 50 MG PO TABS: 50.0000 mg | ORAL_TABLET | Freq: Every day | ORAL | 3 refills | Status: DC

## 2018-11-10 NOTE — Telephone Encounter (Signed)
Pt called and states that he has been out of his meds for 1 week. He has contacted [facility] and still has not gotten a refill.

## 2018-11-10 NOTE — Telephone Encounter (Signed)
Bicalutamide was refilled per last office note.  Patient aware.

## 2018-11-16 ENCOUNTER — Ambulatory Visit: Payer: Self-pay | Admitting: Urology

## 2019-03-08 DIAGNOSIS — C61 Malignant neoplasm of prostate: Secondary | ICD-10-CM | POA: Insufficient documentation

## 2019-03-16 ENCOUNTER — Other Ambulatory Visit: Payer: Self-pay | Admitting: Internal Medicine

## 2019-03-16 DIAGNOSIS — J984 Other disorders of lung: Secondary | ICD-10-CM

## 2019-03-24 ENCOUNTER — Ambulatory Visit: Payer: Medicare Other

## 2019-03-24 ENCOUNTER — Ambulatory Visit
Admission: RE | Admit: 2019-03-24 | Discharge: 2019-03-24 | Disposition: A | Discharge status: Home / Self Care | Payer: Medicare Other | Source: Ambulatory Visit | Attending: Internal Medicine | Admitting: Internal Medicine

## 2019-03-24 ENCOUNTER — Other Ambulatory Visit: Payer: Self-pay

## 2019-03-24 DIAGNOSIS — J984 Other disorders of lung: Secondary | ICD-10-CM | POA: Insufficient documentation

## 2019-03-28 ENCOUNTER — Ambulatory Visit: Admission: RE | Admit: 2019-03-28 | Payer: Medicare Other | Source: Ambulatory Visit

## 2019-03-29 ENCOUNTER — Inpatient Hospital Stay: Payer: Medicare Other | Attending: Oncology | Admitting: Oncology

## 2019-03-29 ENCOUNTER — Encounter: Payer: Self-pay | Admitting: Oncology

## 2019-03-29 ENCOUNTER — Inpatient Hospital Stay: Payer: Medicare Other

## 2019-03-29 ENCOUNTER — Encounter: Payer: Self-pay | Admitting: *Deleted

## 2019-03-29 ENCOUNTER — Other Ambulatory Visit: Payer: Self-pay

## 2019-03-29 VITALS — BP 165/81 | HR 65 | Temp 97.4°F | Resp 18 | Ht 72.0 in | Wt 229.5 lb

## 2019-03-29 DIAGNOSIS — R911 Solitary pulmonary nodule: Secondary | ICD-10-CM | POA: Insufficient documentation

## 2019-03-29 DIAGNOSIS — R590 Localized enlarged lymph nodes: Secondary | ICD-10-CM | POA: Diagnosis not present

## 2019-03-29 DIAGNOSIS — R918 Other nonspecific abnormal finding of lung field: Secondary | ICD-10-CM | POA: Insufficient documentation

## 2019-03-29 DIAGNOSIS — M5410 Radiculopathy, site unspecified: Secondary | ICD-10-CM | POA: Diagnosis not present

## 2019-03-29 DIAGNOSIS — Z87891 Personal history of nicotine dependence: Secondary | ICD-10-CM | POA: Diagnosis not present

## 2019-03-29 DIAGNOSIS — Z8546 Personal history of malignant neoplasm of prostate: Secondary | ICD-10-CM | POA: Diagnosis not present

## 2019-03-29 DIAGNOSIS — M545 Low back pain: Secondary | ICD-10-CM | POA: Diagnosis not present

## 2019-03-29 DIAGNOSIS — Z7189 Other specified counseling: Secondary | ICD-10-CM | POA: Insufficient documentation

## 2019-03-29 DIAGNOSIS — R0602 Shortness of breath: Secondary | ICD-10-CM

## 2019-03-29 DIAGNOSIS — G8929 Other chronic pain: Secondary | ICD-10-CM | POA: Insufficient documentation

## 2019-03-29 DIAGNOSIS — R599 Enlarged lymph nodes, unspecified: Secondary | ICD-10-CM | POA: Insufficient documentation

## 2019-03-29 DIAGNOSIS — N62 Hypertrophy of breast: Secondary | ICD-10-CM | POA: Diagnosis not present

## 2019-03-29 DIAGNOSIS — R05 Cough: Secondary | ICD-10-CM

## 2019-03-29 LAB — CBC WITH DIFFERENTIAL/PLATELET
Abs Immature Granulocytes: 0.02 10*3/uL (ref 0.00–0.07)
Basophils Absolute: 0.1 10*3/uL (ref 0.0–0.1)
Basophils Relative: 1 %
Eosinophils Absolute: 0.1 10*3/uL (ref 0.0–0.5)
Eosinophils Relative: 1 %
HCT: 43.5 % (ref 39.0–52.0)
Hemoglobin: 14.2 g/dL (ref 13.0–17.0)
Immature Granulocytes: 0 %
Lymphocytes Relative: 35 %
Lymphs Abs: 3.1 10*3/uL (ref 0.7–4.0)
MCH: 29.8 pg (ref 26.0–34.0)
MCHC: 32.6 g/dL (ref 30.0–36.0)
MCV: 91.4 fL (ref 80.0–100.0)
Monocytes Absolute: 0.8 10*3/uL (ref 0.1–1.0)
Monocytes Relative: 9 %
Neutro Abs: 4.7 10*3/uL (ref 1.7–7.7)
Neutrophils Relative %: 54 %
Platelets: 227 10*3/uL (ref 150–400)
RBC: 4.76 MIL/uL (ref 4.22–5.81)
RDW: 12.5 % (ref 11.5–15.5)
WBC: 8.7 10*3/uL (ref 4.0–10.5)
nRBC: 0 % (ref 0.0–0.2)

## 2019-03-29 LAB — COMPREHENSIVE METABOLIC PANEL
ALT: 12 U/L (ref 0–44)
AST: 17 U/L (ref 15–41)
Albumin: 3.8 g/dL (ref 3.5–5.0)
Alkaline Phosphatase: 42 U/L (ref 38–126)
Anion gap: 10 (ref 5–15)
BUN: 23 mg/dL (ref 8–23)
CO2: 19 mmol/L — ABNORMAL LOW (ref 22–32)
Calcium: 8.8 mg/dL — ABNORMAL LOW (ref 8.9–10.3)
Chloride: 109 mmol/L (ref 98–111)
Creatinine, Ser: 1.83 mg/dL — ABNORMAL HIGH (ref 0.61–1.24)
GFR calc Af Amer: 40 mL/min — ABNORMAL LOW (ref >60)
GFR calc non Af Amer: 34 mL/min — ABNORMAL LOW (ref >60)
Glucose, Bld: 137 mg/dL — ABNORMAL HIGH (ref 70–99)
Potassium: 4.6 mmol/L (ref 3.5–5.1)
Sodium: 138 mmol/L (ref 135–145)
Total Bilirubin: 1 mg/dL (ref 0.3–1.2)
Total Protein: 7.5 g/dL (ref 6.5–8.1)

## 2019-03-29 LAB — PSA: Prostatic Specific Antigen: <0.01 ng/mL (ref 0.00–4.00)

## 2019-03-29 NOTE — Progress Notes (Signed)
Patient here to establish care. Complains of shortness of breath for about a year.

## 2019-03-29 NOTE — Progress Notes (Signed)
Hematology/Oncology Consult note [facility] Telephone:([phone] Fax:([phone]) [phone]   Patient Care Team: [name], MD as PCP - General (Physical Medicine and Rehabilitation)  REFERRING PROVIDER: [name], MD  CHIEF COMPLAINTS/REASON FOR VISIT:  Evaluation of lung mass  HISTORY OF PRESENTING ILLNESS:   Carlos Levine is a  78 y.o.  male with PMH listed below was seen in consultation at the request of  [name], MD  for evaluation of lung mass.  Patient reports chronic SOB for about 1 year. Cough gets worse recently, productive with clear mucus.  No exacerbating or alleviating factors.  Former smoker, quit smoking in 2012.  Used to have 30-pack-year smoking history. Patient had a chest x-ray done followed by CT chest which showed lung mass and hilar lymphadenopathy.  Refer to heme-onc for further evaluation and management. Remote history of prostate cancer, previously follows up with Dr. [name].  Patient is on Casodex 50 mg daily. Was last seen by Dr. [name] in 2018., Malignant neoplasm of prostate ([facility]) 02/2006  Gleason's 8 in 4 of 8 biopsy specimens all from the right side. PSA 12.2   Chronic lower back pain and radiculopathy. Lives at home with wife, daughter and granddaughter Review of Systems  Constitutional: Positive for fatigue. Negative for appetite change, chills, fever and unexpected weight change.  HENT:   Negative for hearing loss and voice change.   Eyes: Negative for eye problems and icterus.  Respiratory: Positive for cough and shortness of breath. Negative for chest tightness.   Cardiovascular: Negative for chest pain and leg swelling.  Gastrointestinal: Negative for abdominal distention and abdominal pain.  Endocrine: Negative for hot flashes.  Genitourinary: Negative for difficulty urinating, dysuria and frequency.   Musculoskeletal: Positive for back pain. Negative for arthralgias.  Skin: Negative for itching and  rash.  Neurological: Negative for light-headedness and numbness.  Hematological: Negative for adenopathy. Does not bruise/bleed easily.  Psychiatric/Behavioral: Negative for confusion.    MEDICAL HISTORY:  Past Medical History:  Diagnosis Date  . Arthritis   . Cancer [facility])    prostate  . Diabetes mellitus without complication ([place])   . Hyperlipidemia   . Prostate cancer [facility])     SURGICAL HISTORY: Past Surgical History:  Procedure Laterality Date  . PROSTATE CRYOABLATION    . SPINAL FUSION      SOCIAL HISTORY: Social History   Socioeconomic History  . Marital status: Married    Spouse name: Not on file  . Number of children: Not on file  . Years of education: Not on file  . Highest education level: Not on file  Occupational History  . Occupation: retired  Scientific laboratory technician  . Financial resource strain: Not on file  . Food insecurity:    Worry: Not on file    Inability: Not on file  . Transportation needs:    Medical: Not on file    Non-medical: Not on file  Tobacco Use  . Smoking status: Former Smoker    Years: 30.00    Types: Cigarettes    Last attempt to quit: 2012    Years since quitting: 8.4  . Smokeless tobacco: Never Used  Substance and Sexual Activity  . Alcohol use: No  . Drug use: No  . Sexual activity: Yes    Birth control/protection: None  Lifestyle  . Physical activity:    Days per week: Not on file    Minutes per session: Not on file  . Stress: Not  on file  Relationships  . Social connections:    Talks on phone: Not on file    Gets together: Not on file    Attends religious service: Not on file    Active member of club or organization: Not on file    Attends meetings of clubs or organizations: Not on file    Relationship status: Not on file  . Intimate partner violence:    Fear of current or ex partner: Not on file    Emotionally abused: Not on file    Physically abused: Not on file    Forced sexual activity: Not on file  Other Topics  Concern  . Not on file  Social History Narrative  . Not on file    FAMILY HISTORY: Family History  Problem Relation Age of Onset  . Lung cancer Father   . Pancreatic cancer Father   . Brain cancer Mother   . Stomach cancer Paternal Aunt   . Stomach cancer Paternal Uncle   . Cancer Maternal Grandmother   . Kidney cancer Maternal Grandfather     ALLERGIES:  is allergic to iodinated diagnostic agents and ace inhibitors.  MEDICATIONS:  Current Outpatient Medications  Medication Sig Dispense Refill  . bicalutamide (CASODEX) 50 MG tablet Take 1 tablet (50 mg total) by mouth daily. 90 tablet 3  . Blood Glucose Monitoring Suppl (ONE TOUCH ULTRA 2) w/Device KIT Use as instructed.    . pravastatin (PRAVACHOL) 80 MG tablet TAKE 1 TABLET BY MOUTH EVERY DAY AT NIGHT    . traMADol-acetaminophen (ULTRACET) 37.5-325 MG tablet Take 1 tablet by mouth every 8 (eight) hours as needed. for pain    . docusate sodium (COLACE) 100 MG capsule TAKE 1 CAPSULE (100 MG TOTAL) BY MOUTH 2 (TWO) TIMES DAILY.    Marland Kitchen gabapentin (NEURONTIN) 100 MG capsule Take by mouth.    Marland Kitchen glucose blood (ONE TOUCH ULTRA TEST) test strip TEST SUGAR (TWO) TIMES DAILY.     No current facility-administered medications for this visit.      PHYSICAL EXAMINATION: ECOG PERFORMANCE STATUS: 2 - Symptomatic, <50% confined to bed Vitals:   03/29/19 1450  BP: (!) 165/81  Pulse: 65  Resp: 18  Temp: (!) 97.4 F (36.3 C)  SpO2: 95%   Filed Weights   03/29/19 1450  Weight: 229 lb 8 oz (104.1 kg)    Physical Exam Constitutional:      General: He is not in acute distress.    Appearance: He is obese.  HENT:     Head: Normocephalic and atraumatic.  Eyes:     General: No scleral icterus.    Pupils: Pupils are equal, round, and reactive to light.  Neck:     Musculoskeletal: Normal range of motion and neck supple.  Cardiovascular:     Rate and Rhythm: Normal rate and regular rhythm.     Heart sounds: Normal heart sounds.   Pulmonary:     Effort: Pulmonary effort is normal. No respiratory distress.     Breath sounds: No wheezing.  Abdominal:     General: Bowel sounds are normal. There is no distension.     Palpations: Abdomen is soft. There is no mass.     Tenderness: There is no abdominal tenderness.  Musculoskeletal: Normal range of motion.        General: No deformity.  Skin:    General: Skin is warm and dry.     Findings: No erythema or rash.  Neurological:  Mental Status: He is alert and oriented to person, place, and time.     Cranial Nerves: No cranial nerve deficit.     Coordination: Coordination normal.  Psychiatric:        Behavior: Behavior normal.        Thought Content: Thought content normal.   Bilateral gynecomastia  LABORATORY DATA:  I have reviewed the data as listed Lab Results  Component Value Date   WBC 8.7 03/29/2019   HGB 14.2 03/29/2019   HCT 43.5 03/29/2019   MCV 91.4 03/29/2019   PLT 227 03/29/2019   Recent Labs    03/29/19 1603  NA 138  K 4.6  CL 109  CO2 19*  GLUCOSE 137*  BUN 23  CREATININE 1.83*  CALCIUM 8.8*  GFRNONAA 34*  GFRAA 40*  PROT 7.5  ALBUMIN 3.8  AST 17  ALT 12  ALKPHOS 42  BILITOT 1.0   Iron/TIBC/Ferritin/ %Sat No results found for: IRON, TIBC, FERRITIN, IRONPCTSAT    RADIOGRAPHIC STUDIES: I have personally reviewed the radiological images as listed and agreed with the findings in the report.  Ct Chest Wo Contrast  Result Date: 03/25/2019 CLINICAL DATA:  Cavitary lung lesion, prior abnormal outside chest radiograph; history benign essential hypertension, diabetes mellitus, prostate cancer, former smoker, stage III chronic kidney disease EXAM: CT CHEST WITHOUT CONTRAST TECHNIQUE: Multidetector CT imaging of the chest was performed following the standard protocol without IV contrast. Sagittal and coronal MPR images reconstructed from axial data set. COMPARISON:  None FINDINGS: Cardiovascular: Atherosclerotic calcifications aorta,  proximal great vessels and coronary arteries. Aorta normal caliber. No pericardial effusion. Mediastinum/Nodes: Esophagus unremarkable. Base of cervical region normal appearance. No axillary or mediastinal adenopathy. 14 mm diameter RIGHT hilar node, with overall hilar assessment limited by lack of IV contrast. Lungs/Pleura: Diffuse panlobular emphysema. Mild scattered central peribronchial thickening. Cavitary mass identified in posterior segment of RIGHT upper lobe, 4.1 x 4.0 X 3.3 cm in size, abutting pleural surfaces at both the posterior hemithorax and at the major fissure. Associated atelectasis and minimal surrounding hazy infiltrative change. No additional mass/nodule. No additional infiltrate, pleural effusion or pneumothorax. Upper Abdomen: Cholelithiasis. Adrenal glands unremarkable. BILATERAL atrophic kidneys with cysts at the upper poles of both kidneys. Remaining visualized upper abdomen normal appearance. Musculoskeletal: Osseous demineralization. Multilevel degenerative disc disease changes thoracic and lumbar spine. Superior endplate compression deformity of L1 vertebral body. Old LEFT rib fractures. No destructive bone lesions. IMPRESSION: 4.1 x 4.0 x 3.3 cm diameter cavitary mass in posterior segment RIGHT upper lobe abutting pleural surfaces at the posterior upper RIGHT hemithorax and at the major fissure consistent with a primary pulmonary neoplasm. Single enlarged 14 mm RIGHT hilar lymph node. Underlying panlobular emphysema. Extensive atherosclerotic calcifications including coronary arteries. Cholelithiasis. BILATERAL renal cortical atrophy and renal cysts. Aortic Atherosclerosis (ICD10-I70.0) and Emphysema (ICD10-J43.9). These results will be called to the ordering clinician or representative by the Radiologist Assistant, and communication documented in the PACS or zVision Dashboard. Electronically Signed   By: Lavonia Dana M.D.   On: 03/25/2019 08:42    ASSESSMENT & PLAN:  1. Lung mass    2. Localized enlarged lymph nodes   3. Solitary pulmonary nodule   4. Goals of care, counseling/discussion   5. History of prostate cancer   6. Gynecomastia    03/24/2019 CT images were independently reviewed and discussed with patient. 4 x 4 x 3.3 cm cavitary mass in the posterior segment right upper lobe abutting pleural surface at the posterior upper right  hemithorax in the major fissure.  Single enlarged 14 mm right hilar lymph node. Underlying panlobular emphysema. I discussed with the patient that images findings are highly suspicious for lung cancer. Recommend patient to obtain PET scan and MRI for staging. Depending on PET scan results, will decide best biopsy method, CT-guided versus bronchoscopy biopsy.  History of prostate cancer history of prostate cancer.  Check PSA. Check baseline CBC and CMP.-Lab results are reviewed.  Chronic gynecomastia.  Secondary to Casodex  Orders Placed This Encounter  Procedures  . NM PET Image Initial (PI) Skull Base To Thigh    Standing Status:   Future    Standing Expiration Date:   03/28/2020    Order Specific Question:   If indicated for the ordered procedure, I authorize the administration of a radiopharmaceutical per Radiology protocol    Answer:   Yes    Order Specific Question:   Preferred imaging location?    Answer:   [facility]    Order Specific Question:   Radiology Contrast Protocol - do NOT remove file path    Answer:   _0 [email or web address]  . MR Brain W Wo Contrast    Standing Status:   Future    Standing Expiration Date:   03/28/2020    Order Specific Question:   ** REASON FOR EXAM (FREE TEXT)    Answer:   lung mass, suspecting lung cancer, staging    Order Specific Question:   If indicated for the ordered procedure, I authorize the administration of contrast media per Radiology protocol    Answer:   Yes    Order Specific Question:   What is the patient's sedation requirement?    Answer:   No  Sedation    Order Specific Question:   Does the patient have a pacemaker or implanted devices?    Answer:   No    Order Specific Question:   Use SRS Protocol?    Answer:   No    Order Specific Question:   Radiology Contrast Protocol - do NOT remove file path    Answer:   _1 [email or web address]    Order Specific Question:   Preferred imaging location?    Answer:   [facility] (table limit-400lbs)  . Comprehensive metabolic panel    Standing Status:   Future    Number of Occurrences:   1    Standing Expiration Date:   03/28/2020  . CBC with Differential/Platelet    Standing Status:   Future    Number of Occurrences:   1    Standing Expiration Date:   03/28/2020  . PSA    Standing Status:   Future    Number of Occurrences:   1    Standing Expiration Date:   03/28/2020    All questions were answered. The patient knows to call the clinic with any problems questions or concerns.  cc [name], MD    Return of visit: To be determined. Thank you for this kind referral and the opportunity to participate in the care of this patient. A copy of today's note is routed to referring provider  Total face to face encounter time for this patient visit was 60 min. >50% of the time was  spent in counseling and coordination of care.    Earlie Server, MD, PhD Hematology Oncology [facility] Pager- [phone] 03/29/2019

## 2019-03-30 NOTE — Progress Notes (Signed)
  Oncology Nurse Navigator Documentation  Navigator Location: [facility]-Med [facility]c (03/29/19 1600) Referral date to RadOnc/MedOnc: 03/28/19 (03/29/19 1600) )Navigator Encounter Type: Initial MedOnc (03/29/19 1600)   Abnormal Finding Date: 03/25/19 (03/29/19 1600)                   Treatment Phase: Abnormal Scans (03/29/19 1600) Barriers/Navigation Needs: Coordination of Care (03/29/19 1600)   Interventions: Coordination of Care (03/29/19 1600)   Coordination of Care: Appts;Radiology (03/29/19 1600)        Acuity: Level 2 (03/29/19 1600)   Acuity Level 2: Initial guidance, education and coordination as needed;Educational needs;Assistance expediting appointments (03/29/19 1600)  Met with patient during initial med-onc consultation with Dr. Tasia Catchings. All questions answered during visit. Reviewed upcoming appts. Pt informed that once PET scan has been authorized with insurance then we can move it sooner than 6/18. Made pt aware that we will be in contact with him after his PET scan to review results and discuss next steps regarding biopsy planning if necessary. Contact info given and instructed to call with any further questions or needs. Pt verbalized understanding. Nothing further needed at this time.   Time Spent with Patient: 60 (03/29/19 1600)

## 2019-04-04 ENCOUNTER — Other Ambulatory Visit: Payer: Self-pay

## 2019-04-04 ENCOUNTER — Encounter
Admission: RE | Admit: 2019-04-04 | Discharge: 2019-04-04 | Disposition: A | Discharge status: Home / Self Care | Payer: Medicare Other | Source: Ambulatory Visit | Attending: Oncology | Admitting: Oncology

## 2019-04-04 DIAGNOSIS — J439 Emphysema, unspecified: Secondary | ICD-10-CM | POA: Insufficient documentation

## 2019-04-04 DIAGNOSIS — R918 Other nonspecific abnormal finding of lung field: Secondary | ICD-10-CM | POA: Insufficient documentation

## 2019-04-04 DIAGNOSIS — I2510 Atherosclerotic heart disease of native coronary artery without angina pectoris: Secondary | ICD-10-CM | POA: Insufficient documentation

## 2019-04-04 DIAGNOSIS — I700 Atherosclerosis of aorta: Secondary | ICD-10-CM | POA: Diagnosis not present

## 2019-04-04 DIAGNOSIS — R590 Localized enlarged lymph nodes: Secondary | ICD-10-CM | POA: Diagnosis not present

## 2019-04-04 LAB — GLUCOSE, CAPILLARY: Glucose-Capillary: 159 mg/dL — ABNORMAL HIGH (ref 70–99)

## 2019-04-04 MED ORDER — FLUDEOXYGLUCOSE F - 18 (FDG) INJECTION
11.9000 | Freq: Once | INTRAVENOUS | Status: AC | PRN
  Administered 2019-04-04: 12 via INTRAVENOUS

## 2019-04-05 ENCOUNTER — Encounter: Payer: Self-pay | Admitting: *Deleted

## 2019-04-05 ENCOUNTER — Other Ambulatory Visit: Payer: Self-pay | Admitting: *Deleted

## 2019-04-05 DIAGNOSIS — R918 Other nonspecific abnormal finding of lung field: Secondary | ICD-10-CM

## 2019-04-05 NOTE — Progress Notes (Signed)
  Oncology Nurse Navigator Documentation  Navigator Location: [facility]c (04/05/19 1500)   )Navigator Encounter Type: Telephone (04/05/19 1500) Telephone: Outgoing Call;Diagnostic Results (04/05/19 1500)                       Barriers/Navigation Needs: Coordination of Care (04/05/19 1500)   Interventions: Coordination of Care (04/05/19 1500)   Coordination of Care: Appts;Radiology (04/05/19 1500)       phone call made to patient to review PET scan results after discussion with Dr. Tasia Catchings. Reviewed upcoming appts for COVID screening, biopsy, and follow up with Dr. Tasia Catchings. All questions answered during phone call. Instructed pt to call with any further questions or needs. Pt verbalized understanding. Nothing further needed at this time.           Time Spent with Patient: 45 (04/05/19 1500)

## 2019-04-07 ENCOUNTER — Ambulatory Visit
Admission: RE | Admit: 2019-04-07 | Discharge: 2019-04-07 | Disposition: A | Discharge status: Home / Self Care | Payer: Medicare Other | Source: Ambulatory Visit | Attending: Oncology | Admitting: Oncology

## 2019-04-07 ENCOUNTER — Other Ambulatory Visit: Payer: Self-pay

## 2019-04-07 ENCOUNTER — Ambulatory Visit: Payer: Medicare Other

## 2019-04-07 DIAGNOSIS — R911 Solitary pulmonary nodule: Secondary | ICD-10-CM | POA: Diagnosis present

## 2019-04-07 MED ORDER — GADOBUTROL 1 MMOL/ML IV SOLN
10.0000 mL | Freq: Once | INTRAVENOUS | Status: AC | PRN
  Administered 2019-04-07: 10 mL via INTRAVENOUS

## 2019-04-08 ENCOUNTER — Other Ambulatory Visit
Admission: RE | Admit: 2019-04-08 | Discharge: 2019-04-08 | Disposition: A | Discharge status: Home / Self Care | Payer: Medicare Other | Source: Ambulatory Visit | Attending: Oncology | Admitting: Oncology

## 2019-04-08 DIAGNOSIS — Z1159 Encounter for screening for other viral diseases: Secondary | ICD-10-CM | POA: Insufficient documentation

## 2019-04-09 LAB — NOVEL CORONAVIRUS, NAA (HOSP ORDER, SEND-OUT TO REF LAB; TAT 18-24 HRS): SARS-CoV-2, NAA: NOT DETECTED

## 2019-04-13 ENCOUNTER — Ambulatory Visit
Admission: RE | Admit: 2019-04-13 | Discharge: 2019-04-13 | Disposition: A | Discharge status: Home / Self Care | Payer: Medicare Other | Source: Ambulatory Visit | Attending: Interventional Radiology | Admitting: Interventional Radiology

## 2019-04-13 ENCOUNTER — Ambulatory Visit
Admission: RE | Admit: 2019-04-13 | Discharge: 2019-04-13 | Disposition: A | Discharge status: Home / Self Care | Payer: Medicare Other | Source: Ambulatory Visit | Attending: Oncology | Admitting: Oncology

## 2019-04-13 ENCOUNTER — Other Ambulatory Visit: Payer: Self-pay

## 2019-04-13 DIAGNOSIS — Z79899 Other long term (current) drug therapy: Secondary | ICD-10-CM | POA: Diagnosis not present

## 2019-04-13 DIAGNOSIS — N189 Chronic kidney disease, unspecified: Secondary | ICD-10-CM | POA: Diagnosis not present

## 2019-04-13 DIAGNOSIS — K8020 Calculus of gallbladder without cholecystitis without obstruction: Secondary | ICD-10-CM | POA: Insufficient documentation

## 2019-04-13 DIAGNOSIS — N281 Cyst of kidney, acquired: Secondary | ICD-10-CM | POA: Insufficient documentation

## 2019-04-13 DIAGNOSIS — Z87442 Personal history of urinary calculi: Secondary | ICD-10-CM | POA: Diagnosis not present

## 2019-04-13 DIAGNOSIS — I2510 Atherosclerotic heart disease of native coronary artery without angina pectoris: Secondary | ICD-10-CM | POA: Insufficient documentation

## 2019-04-13 DIAGNOSIS — J431 Panlobular emphysema: Secondary | ICD-10-CM | POA: Insufficient documentation

## 2019-04-13 DIAGNOSIS — C3411 Malignant neoplasm of upper lobe, right bronchus or lung: Secondary | ICD-10-CM | POA: Diagnosis not present

## 2019-04-13 DIAGNOSIS — N200 Calculus of kidney: Secondary | ICD-10-CM | POA: Insufficient documentation

## 2019-04-13 DIAGNOSIS — Z8546 Personal history of malignant neoplasm of prostate: Secondary | ICD-10-CM | POA: Diagnosis not present

## 2019-04-13 DIAGNOSIS — E1122 Type 2 diabetes mellitus with diabetic chronic kidney disease: Secondary | ICD-10-CM | POA: Insufficient documentation

## 2019-04-13 DIAGNOSIS — Z87891 Personal history of nicotine dependence: Secondary | ICD-10-CM | POA: Insufficient documentation

## 2019-04-13 DIAGNOSIS — E785 Hyperlipidemia, unspecified: Secondary | ICD-10-CM | POA: Insufficient documentation

## 2019-04-13 DIAGNOSIS — J40 Bronchitis, not specified as acute or chronic: Secondary | ICD-10-CM | POA: Insufficient documentation

## 2019-04-13 DIAGNOSIS — Z9889 Other specified postprocedural states: Secondary | ICD-10-CM

## 2019-04-13 DIAGNOSIS — R918 Other nonspecific abnormal finding of lung field: Secondary | ICD-10-CM

## 2019-04-13 HISTORY — DX: Personal history of urinary calculi: Z87.442

## 2019-04-13 HISTORY — DX: Chronic kidney disease, unspecified: N18.9

## 2019-04-13 LAB — CBC WITH DIFFERENTIAL/PLATELET
Abs Immature Granulocytes: 0.03 10*3/uL (ref 0.00–0.07)
Basophils Absolute: 0 10*3/uL (ref 0.0–0.1)
Basophils Relative: 0 %
Eosinophils Absolute: 0 10*3/uL (ref 0.0–0.5)
Eosinophils Relative: 0 %
HCT: 44.7 % (ref 39.0–52.0)
Hemoglobin: 14.5 g/dL (ref 13.0–17.0)
Immature Granulocytes: 0 %
Lymphocytes Relative: 28 %
Lymphs Abs: 2.6 10*3/uL (ref 0.7–4.0)
MCH: 29.7 pg (ref 26.0–34.0)
MCHC: 32.4 g/dL (ref 30.0–36.0)
MCV: 91.4 fL (ref 80.0–100.0)
Monocytes Absolute: 1 10*3/uL (ref 0.1–1.0)
Monocytes Relative: 10 %
Neutro Abs: 5.7 10*3/uL (ref 1.7–7.7)
Neutrophils Relative %: 62 %
Platelets: 222 10*3/uL (ref 150–400)
RBC: 4.89 MIL/uL (ref 4.22–5.81)
RDW: 12.4 % (ref 11.5–15.5)
WBC: 9.4 10*3/uL (ref 4.0–10.5)
nRBC: 0 % (ref 0.0–0.2)

## 2019-04-13 LAB — PROTIME-INR
INR: 1.1 (ref 0.8–1.2)
Prothrombin Time: 13.8 seconds (ref 11.4–15.2)

## 2019-04-13 LAB — GLUCOSE, CAPILLARY: Glucose-Capillary: 162 mg/dL — ABNORMAL HIGH (ref 70–99)

## 2019-04-13 LAB — APTT: aPTT: 36 seconds (ref 24–36)

## 2019-04-13 MED ORDER — MIDAZOLAM HCL 5 MG/5ML IJ SOLN
INTRAMUSCULAR | Status: AC | PRN
  Administered 2019-04-13 (×2): 0.5 mg via INTRAVENOUS

## 2019-04-13 MED ORDER — SODIUM CHLORIDE 0.9 % IV SOLN
INTRAVENOUS | Status: DC
  Administered 2019-04-13: 11:00:00 via INTRAVENOUS

## 2019-04-13 MED ORDER — MIDAZOLAM HCL 5 MG/5ML IJ SOLN
INTRAMUSCULAR | Status: AC
  Filled 2019-04-13: qty 5

## 2019-04-13 MED ORDER — FENTANYL CITRATE (PF) 100 MCG/2ML IJ SOLN
INTRAMUSCULAR | Status: AC | PRN
  Administered 2019-04-13 (×2): 25 ug via INTRAVENOUS

## 2019-04-13 MED ORDER — FENTANYL CITRATE (PF) 100 MCG/2ML IJ SOLN
INTRAMUSCULAR | Status: AC
  Filled 2019-04-13: qty 4

## 2019-04-13 NOTE — Procedures (Signed)
Pre procedural Dx: Hypermetabolic right upper lobe mass  Post procedural Dx: Same  Technically successful CT guided biopsy of indeterminate mass within the right upper lobe.   EBL: None.   Complications: None immediate.   Ronny Bacon, MD Pager #: [phone]

## 2019-04-13 NOTE — Consult Note (Signed)
Chief Complaint: Indeterminate hypermetabolic right upper lobe pulmonary mass  Referring Physician(s): Yu,Zhou  Patient Status: [facility] - Out-pt  History of Present Illness: Carlos Levine is a 78 y.o. male with past medical history significant for prostate cancer, hyperlipidemia and diabetes who has been experiencing a productive cough for the past 4 to 6 weeks.  CT scan performed 03/24/2019 demonstrated a partially necrotic mass within the subpleural aspect of the right upper lobe found to be hypermetabolic on subsequent PET/CT performed 04/04/2019.  Patient presents today for CT-guided right upper lobe pulmonary mass biopsy.  The patient is unaccompanied and serves as his own historian.  Patient reports no significant change in his productive intermittent cough.  He denies hemoptysis.  He denies fever or chills.  He denies unintentional weight loss.  Past Medical History:  Diagnosis Date  . Arthritis   . Cancer [facility])    prostate  . Chronic kidney disease   . Diabetes mellitus without complication ([place])   . History of kidney stones   . Hyperlipidemia   . Prostate cancer [facility])     Past Surgical History:  Procedure Laterality Date  . AMPUTATION FINGER / THUMB Right   . PROSTATE CRYOABLATION    . SPINAL FUSION      Allergies: Iodinated diagnostic agents and Ace inhibitors  Medications: Prior to Admission medications   Medication Sig Start Date End Date Taking? Authorizing Provider  bicalutamide (CASODEX) 50 MG tablet Take 1 tablet (50 mg total) by mouth daily. 11/10/18  Yes Stoioff, Ronda Fairly, MD  pravastatin (PRAVACHOL) 80 MG tablet TAKE 1 TABLET BY MOUTH EVERY DAY AT NIGHT 01/02/19  Yes [provider]  Blood Glucose Monitoring Suppl (ONE TOUCH ULTRA 2) w/Device KIT Use as instructed. 02/28/15   [provider]  docusate sodium (COLACE) 100 MG capsule TAKE 1 CAPSULE (100 MG TOTAL) BY MOUTH 2 (TWO) TIMES DAILY. 12/04/17   [provider]  gabapentin  (NEURONTIN) 100 MG capsule Take by mouth. 06/22/18   [provider]  glucose blood (ONE TOUCH ULTRA TEST) test strip TEST SUGAR (TWO) TIMES DAILY. 10/27/17   [provider]  traMADol-acetaminophen (ULTRACET) 37.5-325 MG tablet Take 1 tablet by mouth every 8 (eight) hours as needed. for pain 10/19/18   [provider]     Family History  Problem Relation Age of Onset  . Lung cancer Father   . Pancreatic cancer Father   . Brain cancer Mother   . Stomach cancer Paternal Aunt   . Stomach cancer Paternal Uncle   . Cancer Maternal Grandmother   . Kidney cancer Maternal Grandfather     Social History   Socioeconomic History  . Marital status: Married    Spouse name: [name]  . Number of children: 4  . Years of education: Not on file  . Highest education level: Not on file  Occupational History  . Occupation: retired  Scientific laboratory technician  . Financial resource strain: Not hard at all  . Food insecurity    Worry: Never true    Inability: Never true  . Transportation needs    Medical: No    Non-medical: No  Tobacco Use  . Smoking status: Former Smoker    Years: 30.00    Types: Cigarettes    Quit date: 10/20/2009    Years since quitting: 9.4  . Smokeless tobacco: Never Used  Substance and Sexual Activity  . Alcohol use: No  . Drug use: No  . Sexual activity: Yes  Birth control/protection: None  Lifestyle  . Physical activity    Days per week: 0 days    Minutes per session: Not on file  . Stress: To some extent  Relationships  . Social Herbalist on phone: More than three times a week    Gets together: Not on file    Attends religious service: Never    Active member of club or organization: No    Attends meetings of clubs or organizations: Never    Relationship status: Married  Other Topics Concern  . Not on file  Social History Narrative  . Not on file    ECOG Status: 1 - Symptomatic but completely ambulatory  Review of Systems: A 12  point ROS discussed and pertinent positives are indicated in the HPI above.  All other systems are negative.  Review of Systems  Constitutional: Negative for activity change, appetite change, fatigue and fever.  Respiratory: Positive for cough. Negative for shortness of breath.     Vital Signs: BP 137/77   Pulse 67   Temp (!) 97.5 F (36.4 C) (Oral)   Resp (!) 22   Ht _0  (1.778 m)   Wt 103.9 kg   SpO2 96%   BMI 32.86 kg/m   Physical Exam Vitals signs and nursing note reviewed.  Constitutional:      Appearance: Normal appearance.  Cardiovascular:     Rate and Rhythm: Normal rate and regular rhythm.     Heart sounds: Normal heart sounds.  Pulmonary:     Effort: Pulmonary effort is normal.     Breath sounds: Normal breath sounds.  Neurological:     Mental Status: He is alert.  Psychiatric:        Mood and Affect: Mood normal.        Behavior: Behavior normal.        Thought Content: Thought content normal.     Imaging: Ct Chest Wo Contrast  Result Date: 03/25/2019 CLINICAL DATA:  Cavitary lung lesion, prior abnormal outside chest radiograph; history benign essential hypertension, diabetes mellitus, prostate cancer, former smoker, stage III chronic kidney disease EXAM: CT CHEST WITHOUT CONTRAST TECHNIQUE: Multidetector CT imaging of the chest was performed following the standard protocol without IV contrast. Sagittal and coronal MPR images reconstructed from axial data set. COMPARISON:  None FINDINGS: Cardiovascular: Atherosclerotic calcifications aorta, proximal great vessels and coronary arteries. Aorta normal caliber. No pericardial effusion. Mediastinum/Nodes: Esophagus unremarkable. Base of cervical region normal appearance. No axillary or mediastinal adenopathy. 14 mm diameter RIGHT hilar node, with overall hilar assessment limited by lack of IV contrast. Lungs/Pleura: Diffuse panlobular emphysema. Mild scattered central peribronchial thickening. Cavitary mass identified  in posterior segment of RIGHT upper lobe, 4.1 x 4.0 X 3.3 cm in size, abutting pleural surfaces at both the posterior hemithorax and at the major fissure. Associated atelectasis and minimal surrounding hazy infiltrative change. No additional mass/nodule. No additional infiltrate, pleural effusion or pneumothorax. Upper Abdomen: Cholelithiasis. Adrenal glands unremarkable. BILATERAL atrophic kidneys with cysts at the upper poles of both kidneys. Remaining visualized upper abdomen normal appearance. Musculoskeletal: Osseous demineralization. Multilevel degenerative disc disease changes thoracic and lumbar spine. Superior endplate compression deformity of L1 vertebral body. Old LEFT rib fractures. No destructive bone lesions. IMPRESSION: 4.1 x 4.0 x 3.3 cm diameter cavitary mass in posterior segment RIGHT upper lobe abutting pleural surfaces at the posterior upper RIGHT hemithorax and at the major fissure consistent with a primary pulmonary neoplasm. Single enlarged 14 mm RIGHT hilar  lymph node. Underlying panlobular emphysema. Extensive atherosclerotic calcifications including coronary arteries. Cholelithiasis. BILATERAL renal cortical atrophy and renal cysts. Aortic Atherosclerosis (ICD10-I70.0) and Emphysema (ICD10-J43.9). These results will be called to the ordering clinician or representative by the Radiologist Assistant, and communication documented in the PACS or zVision Dashboard. Electronically Signed   By: Lavonia Dana M.D.   On: 03/25/2019 08:42   Mr Jeri Cos ZO Contrast  Result Date: 04/08/2019 CLINICAL DATA:  Suspected lung cancer, staging. EXAM: MRI HEAD WITHOUT AND WITH CONTRAST TECHNIQUE: Multiplanar, multiecho pulse sequences of the brain and surrounding structures were obtained without and with intravenous contrast. CONTRAST:  10 cc Gadavist intravenous COMPARISON:  06/11/2017 head CT FINDINGS: Brain: No enhancement or swelling to suggest metastatic disease. Moderate chronic small vessel ischemic  change in the cerebral white matter. Mild for age atrophy without specific pattern. No incidental infarct, hemorrhage, hydrocephalus, or collection. Vascular: Major flow voids and vascular enhancements are preserved Skull and upper cervical spine: Negative for marrow lesion Sinuses/Orbits: Negative IMPRESSION: Negative for metastatic disease. Electronically Signed   By: Monte Fantasia M.D.   On: 04/08/2019 09:51   Nm Pet Image Initial (pi) Skull Base To Thigh  Result Date: 04/04/2019 CLINICAL DATA:  Initial treatment strategy for pulmonary nodule. EXAM: NUCLEAR MEDICINE PET SKULL BASE TO THIGH TECHNIQUE: 12.0 mCi F-18 FDG was injected intravenously. Full-ring PET imaging was performed from the skull base to thigh after the radiotracer. CT data was obtained and used for attenuation correction and anatomic localization. Fasting blood glucose: 159 mg/dl COMPARISON:  03/24/2019 FINDINGS: Mediastinal blood pool activity: SUV max 3.9 Liver activity: SUV max NA NECK: No hypermetabolic lymph nodes in the neck. Incidental CT findings: none CHEST: Cavitary lung mass within the posterior right upper lobe measures 4.1 cm and has an SUV max of 9.28. No change from 03/24/2019. No hypermetabolic mediastinal or hilar lymph nodes. Incidental CT findings: Emphysema. Aortic atherosclerosis. Left main, lad, left circumflex and RCA coronary artery calcification. ABDOMEN/PELVIS: No focal liver abnormality. Small stones noted within the gallbladder. No abnormal uptake within the pancreas or spleen. The adrenal glands are unremarkable. No hypermetabolic lymph nodes within the abdomen or pelvis. Incidental CT findings: Gallstones. Aortic atherosclerosis. Small bilateral renal calculi. SKELETON: No focal hypermetabolic activity to suggest skeletal metastasis. Incidental CT findings: none IMPRESSION: 1. Cavitary lung mass within the posterolateral right upper lobe is unchanged in size from 03/24/2019 and exhibits intense radiotracer  uptake within SUV max of 9.28. Cannot rule out underlying malignancy. 2. No FDG avid lymph nodes or evidence of distant metastatic disease. 3. Aortic Atherosclerosis (ICD10-I70.0) and Emphysema (ICD10-J43.9). 4. Coronary artery calcifications. Electronically Signed   By: Kerby Moors M.D.   On: 04/04/2019 16:14    Labs:  CBC: Recent Labs    03/29/19 1603 04/13/19 1018  WBC 8.7 9.4  HGB 14.2 14.5  HCT 43.5 44.7  PLT 227 222    COAGS: Recent Labs    04/13/19 1018  INR 1.1  APTT 36    BMP: Recent Labs    03/29/19 1603  NA 138  K 4.6  CL 109  CO2 19*  GLUCOSE 137*  BUN 23  CALCIUM 8.8*  CREATININE 1.83*  GFRNONAA 34*  GFRAA 40*    LIVER FUNCTION TESTS: Recent Labs    03/29/19 1603  BILITOT 1.0  AST 17  ALT 12  ALKPHOS 42  PROT 7.5  ALBUMIN 3.8    TUMOR MARKERS: No results for input(s): AFPTM, CEA, CA199, CHROMGRNA in the last  8760 hours.  Assessment and Plan:  Carlos Levine is a 78 y.o. male with past medical history significant for prostate cancer, hyperlipidemia and diabetes who has been experiencing a productive cough for the past 4 to 6 weeks.  CT scan performed 03/24/2019 demonstrated a partially necrotic mass within the subpleural aspect of the right upper lobe found to be hypermetabolic on subsequent PET/CT performed 04/04/2019.  Patient presents today for CT-guided right upper lobe pulmonary mass biopsy.    Patient reports no significant change in his productive intermittent cough.  He is otherwise without complaint.  Risks and benefits of CT guided lung nodule biopsy was discussed with the patient including, but not limited to bleeding, hemoptysis, respiratory failure requiring intubation, infection, pneumothorax requiring chest tube placement, stroke from air embolism or even death.  All of the patient's questions were answered and the patient is agreeable to proceed.  Consent signed and in chart.  Thank you for this interesting consult.  I  greatly enjoyed meeting Carlos Levine and look forward to participating in their care.  A copy of this report was sent to the requesting provider on this date.  Electronically Signed: Sandi Mariscal, MD 04/13/2019, 11:15 AM   I spent a total of 15 Minutes in face to face in clinical consultation, greater than 50% of which was counseling/coordinating care for CT-guided right upper lobe pulmonary mass biopsy

## 2019-04-13 NOTE — Progress Notes (Signed)
Dr. Pascal Lux by pt. Room to speak with pt. Post procedure. MD states "your x-ray looks fine and you can eat now." Pt. To be DC'd home at 14:30 pm today. Spoke with pt. Son re: DC instructions.

## 2019-04-14 ENCOUNTER — Other Ambulatory Visit: Payer: Self-pay | Admitting: Anatomic Pathology & Clinical Pathology

## 2019-04-14 LAB — SURGICAL PATHOLOGY

## 2019-04-19 ENCOUNTER — Inpatient Hospital Stay: Payer: Medicare Other | Admitting: Oncology

## 2019-04-19 ENCOUNTER — Other Ambulatory Visit: Payer: Self-pay | Admitting: Oncology

## 2019-04-20 ENCOUNTER — Encounter: Payer: Self-pay | Admitting: Oncology

## 2019-04-20 ENCOUNTER — Encounter: Payer: Self-pay | Admitting: *Deleted

## 2019-04-20 ENCOUNTER — Other Ambulatory Visit: Payer: Self-pay

## 2019-04-20 ENCOUNTER — Inpatient Hospital Stay: Payer: Medicare Other | Attending: Oncology | Admitting: Oncology

## 2019-04-20 VITALS — BP 152/75 | HR 78 | Temp 97.1°F | Resp 18 | Wt 223.6 lb

## 2019-04-20 DIAGNOSIS — Z8546 Personal history of malignant neoplasm of prostate: Secondary | ICD-10-CM | POA: Diagnosis not present

## 2019-04-20 DIAGNOSIS — R0609 Other forms of dyspnea: Secondary | ICD-10-CM | POA: Diagnosis not present

## 2019-04-20 DIAGNOSIS — G8929 Other chronic pain: Secondary | ICD-10-CM | POA: Insufficient documentation

## 2019-04-20 DIAGNOSIS — C3491 Malignant neoplasm of unspecified part of right bronchus or lung: Secondary | ICD-10-CM

## 2019-04-20 DIAGNOSIS — R059 Cough, unspecified: Secondary | ICD-10-CM

## 2019-04-20 DIAGNOSIS — Z7189 Other specified counseling: Secondary | ICD-10-CM

## 2019-04-20 DIAGNOSIS — M545 Low back pain: Secondary | ICD-10-CM | POA: Diagnosis not present

## 2019-04-20 DIAGNOSIS — N62 Hypertrophy of breast: Secondary | ICD-10-CM | POA: Diagnosis not present

## 2019-04-20 DIAGNOSIS — Z87891 Personal history of nicotine dependence: Secondary | ICD-10-CM | POA: Diagnosis not present

## 2019-04-20 DIAGNOSIS — R05 Cough: Secondary | ICD-10-CM | POA: Insufficient documentation

## 2019-04-20 MED ORDER — DEXTROMETHORPHAN-GUAIFENESIN 10-100 MG/5ML PO SYRP: 5.0000 mL | ORAL_SOLUTION | Freq: Two times a day (BID) | ORAL | 1 refills | Status: DC

## 2019-04-20 NOTE — Progress Notes (Signed)
Hematology/Oncology Consult note [facility] Telephone:([phone] [phone]   Patient Care Team: [name], MD as PCP - General (Internal Medicine) Telford Nab, RN as Registered Nurse  REFERRING PROVIDER: [name], MD  CHIEF COMPLAINTS/REASON FOR VISIT:  Evaluation of lung mass  HISTORY OF PRESENTING ILLNESS:   Carlos Levine is a  78 y.o.  male with PMH listed below was seen in consultation at the request of  [name], MD  for evaluation of lung mass.  Patient reports chronic SOB for about 1 year. Cough gets worse recently, productive with clear mucus.  No exacerbating or alleviating factors.  Former smoker, quit smoking in 2012.  Used to have 30-pack-year smoking history. Patient had a chest x-ray done followed by CT chest which showed lung mass and hilar lymphadenopathy.  Refer to heme-onc for further evaluation and management.  Remote history of prostate cancer, previously follows up with Dr. [name].  Patient is on Casodex 50 mg daily. Was last seen by Dr. [name] in 2018., Malignant neoplasm of prostate ([facility]) 02/2006  Gleason's 8 in 4 of 8 biopsy specimens all from the right side. PSA 12.2  PSA 01/23/2017 is <0.01  Chronic lower back pain and radiculopathy. Lives at home with wife, daughter and granddaughter  INTERVAL HISTORY Carlos Levine is a 78 y.o. male who has above history reviewed by me today presents for follow up visit for management of newly diagnosed lung cancer. Problems and complaints are listed below: The interval patient has had PET scan and MRI.  He underwent CT-guided biopsy on 04/13/2019. Pathology showed squamous cell lung cancer.  Continue to have productive cough, shortness of breath with exertion. Chronic back pain unchanged.  Review of Systems  Constitutional: Positive for fatigue. Negative for appetite change, chills, fever and unexpected weight change.  HENT:   Negative for hearing loss  and voice change.   Eyes: Negative for eye problems and icterus.  Respiratory: Positive for cough and shortness of breath. Negative for chest tightness.   Cardiovascular: Negative for chest pain and leg swelling.  Gastrointestinal: Negative for abdominal distention and abdominal pain.  Endocrine: Negative for hot flashes.  Genitourinary: Negative for difficulty urinating, dysuria and frequency.   Musculoskeletal: Positive for back pain. Negative for arthralgias.  Skin: Negative for itching and rash.  Neurological: Negative for light-headedness and numbness.  Hematological: Negative for adenopathy. Does not bruise/bleed easily.  Psychiatric/Behavioral: Negative for confusion.    MEDICAL HISTORY:  Past Medical History:  Diagnosis Date   Arthritis    Cancer ([place])    prostate   Chronic kidney disease    Diabetes mellitus without complication ([place])    History of kidney stones    Hyperlipidemia    Prostate cancer ([place])     SURGICAL HISTORY: Past Surgical History:  Procedure Laterality Date   AMPUTATION FINGER / THUMB Right    PROSTATE CRYOABLATION     SPINAL FUSION      SOCIAL HISTORY: Social History   Socioeconomic History   Marital status: Married    Spouse name: [name]   Number of children: 4   Years of education: Not on file   Highest education level: Not on file  Occupational History   Occupation: retired  Scientist, product/process development strain: Not hard at all   Food insecurity    Worry: Never true    Inability: Never true   Transportation needs    Medical: No    Non-medical: No  Tobacco Use   Smoking status: Former Smoker    Years: 30.00    Types: Cigarettes    Quit date: 10/20/2009    Years since quitting: 9.5   Smokeless tobacco: Never Used  Substance and Sexual Activity   Alcohol use: No   Drug use: No   Sexual activity: Yes    Birth control/protection: None  Lifestyle   Physical activity    Days per week: 0 days     Minutes per session: Not on file   Stress: To some extent  Relationships   Social connections    Talks on phone: More than three times a week    Gets together: Not on file    Attends religious service: Never    Active member of club or organization: No    Attends meetings of clubs or organizations: Never    Relationship status: Married   Intimate partner violence    Fear of current or ex partner: No    Emotionally abused: No    Physically abused: No    Forced sexual activity: No  Other Topics Concern   Not on file  Social History Narrative   Not on file    FAMILY HISTORY: Family History  Problem Relation Age of Onset   Lung cancer Father    Pancreatic cancer Father    Brain cancer Mother    Stomach cancer Paternal Aunt    Stomach cancer Paternal Uncle    Cancer Maternal Grandmother    Kidney cancer Maternal Grandfather     ALLERGIES:  is allergic to iodinated diagnostic agents and ace inhibitors.  MEDICATIONS:  Current Outpatient Medications  Medication Sig Dispense Refill   bicalutamide (CASODEX) 50 MG tablet Take 1 tablet (50 mg total) by mouth daily. 90 tablet 3   Blood Glucose Monitoring Suppl (ONE TOUCH ULTRA 2) w/Device KIT Use as instructed.     glucose blood (ONE TOUCH ULTRA TEST) test strip TEST SUGAR (TWO) TIMES DAILY.     pravastatin (PRAVACHOL) 80 MG tablet TAKE 1 TABLET BY MOUTH EVERY DAY AT NIGHT     traMADol-acetaminophen (ULTRACET) 37.5-325 MG tablet Take 1 tablet by mouth every 8 (eight) hours as needed. for pain     Dextromethorphan-guaiFENesin 10-100 MG/5ML liquid Take 5 mLs by mouth every 12 (twelve) hours. 236 mL 1   docusate sodium (COLACE) 100 MG capsule TAKE 1 CAPSULE (100 MG TOTAL) BY MOUTH 2 (TWO) TIMES DAILY.     gabapentin (NEURONTIN) 100 MG capsule Take by mouth.     No current facility-administered medications for this visit.      PHYSICAL EXAMINATION: ECOG PERFORMANCE STATUS: 2 - Symptomatic, <50% confined to  bed Vitals:   04/20/19 1038  BP: (!) 152/75  Pulse: 78  Resp: 18  Temp: (!) 97.1 F (36.2 C)  SpO2: 97%   Filed Weights   04/20/19 1038  Weight: 223 lb 9.6 oz (101.4 kg)    Physical Exam Constitutional:      General: He is not in acute distress.    Appearance: He is obese.  HENT:     Head: Normocephalic and atraumatic.  Eyes:     General: No scleral icterus.    Pupils: Pupils are equal, round, and reactive to light.  Neck:     Musculoskeletal: Normal range of motion and neck supple.  Cardiovascular:     Rate and Rhythm: Normal rate and regular rhythm.     Heart sounds: Normal heart sounds.  Pulmonary:  Effort: Pulmonary effort is normal. No respiratory distress.     Breath sounds: No wheezing.  Abdominal:     General: Bowel sounds are normal. There is no distension.     Palpations: Abdomen is soft. There is no mass.     Tenderness: There is no abdominal tenderness.  Musculoskeletal: Normal range of motion.        General: No deformity.  Skin:    General: Skin is warm and dry.     Findings: No erythema or rash.  Neurological:     Mental Status: He is alert and oriented to person, place, and time.     Cranial Nerves: No cranial nerve deficit.     Coordination: Coordination normal.  Psychiatric:        Behavior: Behavior normal.        Thought Content: Thought content normal.   Bilateral gynecomastia  LABORATORY DATA:  I have reviewed the data as listed Lab Results  Component Value Date   WBC 9.4 04/13/2019   HGB 14.5 04/13/2019   HCT 44.7 04/13/2019   MCV 91.4 04/13/2019   PLT 222 04/13/2019   Recent Labs    03/29/19 1603  NA 138  K 4.6  CL 109  CO2 19*  GLUCOSE 137*  BUN 23  CREATININE 1.83*  CALCIUM 8.8*  GFRNONAA 34*  GFRAA 40*  PROT 7.5  ALBUMIN 3.8  AST 17  ALT 12  ALKPHOS 42  BILITOT 1.0   Iron/TIBC/Ferritin/ %Sat No results found for: IRON, TIBC, FERRITIN, IRONPCTSAT    RADIOGRAPHIC STUDIES: I have personally reviewed the  radiological images as listed and agreed with the findings in the report.  Ct Chest Wo Contrast  Result Date: 03/25/2019 CLINICAL DATA:  Cavitary lung lesion, prior abnormal outside chest radiograph; history benign essential hypertension, diabetes mellitus, prostate cancer, former smoker, stage III chronic kidney disease EXAM: CT CHEST WITHOUT CONTRAST TECHNIQUE: Multidetector CT imaging of the chest was performed following the standard protocol without IV contrast. Sagittal and coronal MPR images reconstructed from axial data set. COMPARISON:  None FINDINGS: Cardiovascular: Atherosclerotic calcifications aorta, proximal great vessels and coronary arteries. Aorta normal caliber. No pericardial effusion. Mediastinum/Nodes: Esophagus unremarkable. Base of cervical region normal appearance. No axillary or mediastinal adenopathy. 14 mm diameter RIGHT hilar node, with overall hilar assessment limited by lack of IV contrast. Lungs/Pleura: Diffuse panlobular emphysema. Mild scattered central peribronchial thickening. Cavitary mass identified in posterior segment of RIGHT upper lobe, 4.1 x 4.0 X 3.3 cm in size, abutting pleural surfaces at both the posterior hemithorax and at the major fissure. Associated atelectasis and minimal surrounding hazy infiltrative change. No additional mass/nodule. No additional infiltrate, pleural effusion or pneumothorax. Upper Abdomen: Cholelithiasis. Adrenal glands unremarkable. BILATERAL atrophic kidneys with cysts at the upper poles of both kidneys. Remaining visualized upper abdomen normal appearance. Musculoskeletal: Osseous demineralization. Multilevel degenerative disc disease changes thoracic and lumbar spine. Superior endplate compression deformity of L1 vertebral body. Old LEFT rib fractures. No destructive bone lesions. IMPRESSION: 4.1 x 4.0 x 3.3 cm diameter cavitary mass in posterior segment RIGHT upper lobe abutting pleural surfaces at the posterior upper RIGHT hemithorax and  at the major fissure consistent with a primary pulmonary neoplasm. Single enlarged 14 mm RIGHT hilar lymph node. Underlying panlobular emphysema. Extensive atherosclerotic calcifications including coronary arteries. Cholelithiasis. BILATERAL renal cortical atrophy and renal cysts. Aortic Atherosclerosis (ICD10-I70.0) and Emphysema (ICD10-J43.9). These results will be called to the ordering clinician or representative by the Radiologist Assistant, and communication documented in the  PACS or zVision Dashboard. Electronically Signed   By: Lavonia Dana M.D.   On: 03/25/2019 08:42   Mr Jeri Cos WJ Contrast  Result Date: 04/08/2019 CLINICAL DATA:  Suspected lung cancer, staging. EXAM: MRI HEAD WITHOUT AND WITH CONTRAST TECHNIQUE: Multiplanar, multiecho pulse sequences of the brain and surrounding structures were obtained without and with intravenous contrast. CONTRAST:  10 cc Gadavist intravenous COMPARISON:  06/11/2017 head CT FINDINGS: Brain: No enhancement or swelling to suggest metastatic disease. Moderate chronic small vessel ischemic change in the cerebral white matter. Mild for age atrophy without specific pattern. No incidental infarct, hemorrhage, hydrocephalus, or collection. Vascular: Major flow voids and vascular enhancements are preserved Skull and upper cervical spine: Negative for marrow lesion Sinuses/Orbits: Negative IMPRESSION: Negative for metastatic disease. Electronically Signed   By: Monte Fantasia M.D.   On: 04/08/2019 09:51   Nm Pet Image Initial (pi) Skull Base To Thigh  Result Date: 04/04/2019 CLINICAL DATA:  Initial treatment strategy for pulmonary nodule. EXAM: NUCLEAR MEDICINE PET SKULL BASE TO THIGH TECHNIQUE: 12.0 mCi F-18 FDG was injected intravenously. Full-ring PET imaging was performed from the skull base to thigh after the radiotracer. CT data was obtained and used for attenuation correction and anatomic localization. Fasting blood glucose: 159 mg/dl COMPARISON:  03/24/2019  FINDINGS: Mediastinal blood pool activity: SUV max 3.9 Liver activity: SUV max NA NECK: No hypermetabolic lymph nodes in the neck. Incidental CT findings: none CHEST: Cavitary lung mass within the posterior right upper lobe measures 4.1 cm and has an SUV max of 9.28. No change from 03/24/2019. No hypermetabolic mediastinal or hilar lymph nodes. Incidental CT findings: Emphysema. Aortic atherosclerosis. Left main, lad, left circumflex and RCA coronary artery calcification. ABDOMEN/PELVIS: No focal liver abnormality. Small stones noted within the gallbladder. No abnormal uptake within the pancreas or spleen. The adrenal glands are unremarkable. No hypermetabolic lymph nodes within the abdomen or pelvis. Incidental CT findings: Gallstones. Aortic atherosclerosis. Small bilateral renal calculi. SKELETON: No focal hypermetabolic activity to suggest skeletal metastasis. Incidental CT findings: none IMPRESSION: 1. Cavitary lung mass within the posterolateral right upper lobe is unchanged in size from 03/24/2019 and exhibits intense radiotracer uptake within SUV max of 9.28. Cannot rule out underlying malignancy. 2. No FDG avid lymph nodes or evidence of distant metastatic disease. 3. Aortic Atherosclerosis (ICD10-I70.0) and Emphysema (ICD10-J43.9). 4. Coronary artery calcifications. Electronically Signed   By: Kerby Moors M.D.   On: 04/04/2019 16:14   Ct Biopsy  Result Date: 04/13/2019 INDICATION: History of prostate cancer, now with hypermetabolic cavitary right upper lobe pulmonary mass. Please perform CT-guided biopsy for tissue diagnostic purposes. EXAM: CT-GUIDED RIGHT UPPER LOBE PULMONARY MASS BIOPSY COMPARISON:  PET-CT-04/04/2019; chest CT-03/24/2019 MEDICATIONS: None. ANESTHESIA/SEDATION: Fentanyl 50 mcg IV; Versed 1 mg IV Sedation time: 15 minutes; The patient was continuously monitored during the procedure by the interventional radiology nurse under my direct supervision. CONTRAST:  None COMPLICATIONS:  None immediate. PROCEDURE: Informed consent was obtained from the patient following an explanation of the procedure, risks, benefits and alternatives. The patient understands,agrees and consents for the procedure. All questions were addressed. A time out was performed prior to the initiation of the procedure. The patient was positioned prone on the CT table and a limited chest CT was performed for procedural planning demonstrating unchanged appearance of the approximately 3.9 x 3.7 cm partially cavitary mass within the subpleural aspect of the right upper lobe (image 19, series 2). The operative site was prepped and draped in the usual sterile fashion. Under  sterile conditions and local anesthesia, a 17 gauge coaxial needle was advanced into the peripheral aspect of the nodule. Positioning was confirmed with intermittent CT fluoroscopy and followed by the acquisition of 3 core needle biopsy samples with an 18 gauge core needle biopsy device. The coaxial needle was removed following deployment of a Biosentry plug and superficial hemostasis was achieved with manual compression. Limited post procedural chest CT was negative for pneumothorax or additional complication. A dressing was placed. The patient tolerated the procedure well without immediate postprocedural complication. The patient was escorted to have an upright chest radiograph. IMPRESSION: Technically successful CT guided core needle core biopsy of hypermetabolic right upper lobe pulmonary mass. Electronically Signed   By: Sandi Mariscal M.D.   On: 04/13/2019 13:12   Dg Chest Port 1 View  Result Date: 04/13/2019 CLINICAL DATA:  Post right upper lobe pulmonary mass biopsy EXAM: PORTABLE CHEST 1 VIEW COMPARISON:  CT-guided right upper lobe pulmonary mass biopsy-earlier same day; PET-CT-04/04/2019 FINDINGS: Grossly unchanged cardiac silhouette and mediastinal contours with atherosclerotic plaque within the thoracic aorta. Unchanged appearance of known  spiculated right upper lobe pulmonary mass. No pneumothorax. The lungs remain hyperexpanded with flattening of the bilateral hemidiaphragms and thinning of the biapical pulmonary parenchyma. No new focal airspace opacities. No pleural effusion. No evidence of edema. No acute osseus abnormalities. IMPRESSION: No complication following right upper lobe pulmonary mass biopsy. Specifically, no evidence of pneumothorax. Electronically Signed   By: Sandi Mariscal M.D.   On: 04/13/2019 13:01    ASSESSMENT & PLAN:  1. Squamous cell lung cancer, right ([place])   2. Goals of care, counseling/discussion   3. Cough    #PET scan and MRI images were independently reviewed by me and discussed with patient.  Pathology was reviewed and discussed. cT2b N0 M0 squamous cell lung cancer. Recommend to obtain PFT and refer to surgery for discussion of possible surgery.- followed by adjuvant chemotherapy.   If he is not a candidate will refer patient to radiation for discussion of SBRT.  Goal of care was discussed. Curative intent.   Cough, sent Rx of Dextomethophan-guaifenesin liquid to pharmacy.    Chronic gynecomastia.  Secondary to Casodex History of PSA, last PSA in careeverywhere was in 2018.    All questions were answered. The patient knows to call the clinic with any problems questions or concerns.  cc [name], MD    Return of visit: To be determined. We spent sufficient time to discuss many aspect of care, questions were answered to patient's satisfaction. Total face to face encounter time for this patient visit was 25 min. >50% of the time was  spent in counseling and coordination of care.   Earlie Server, MD, PhD Hematology Oncology [facility] at [facility] Pager- [phone] 04/20/2019

## 2019-04-20 NOTE — Progress Notes (Signed)
Oncology Nurse Navigator Documentation  Navigator Location: [facility] (04/20/19 1400)   )Navigator Encounter Type: Follow-up Appt (04/20/19 1400)     Confirmed Diagnosis Date: 04/14/19 (04/20/19 1400)               Patient Visit Type: MedOnc (04/20/19 1400) Treatment Phase: Pre-Tx/Tx Discussion (04/20/19 1400) Barriers/Navigation Needs: Coordination of Care;Education (04/20/19 1400) Education: Newly Diagnosed Cancer Education;Understanding Cancer/ Treatment Options (04/20/19 1400) Interventions: Coordination of Care;Education (04/20/19 1400)   Coordination of Care: Appts (04/20/19 1400) Education Method: Written;Verbal (04/20/19 1400)         met with patient during follow up visit with Dr. Tasia Catchings to discuss biopsy results and treatment options. All questions answered during visit. Pt given resources regarding diagnosis and supportive services available. Pt informed that will refer him to [facility] pulmonary to have PFT's performed and then he will be scheduled to see Dr. Genevive Bi for further evaluation for possible resection. Instructed pt to call back with any further questions or needs. Pt verbalized understanding.        Time Spent with Patient: 45 (04/20/19 1400)

## 2019-04-20 NOTE — Progress Notes (Signed)
Patient here for follow up

## 2019-04-21 ENCOUNTER — Other Ambulatory Visit: Payer: Medicare Other

## 2019-04-21 NOTE — Progress Notes (Signed)
Tumor Board Documentation  Carlos Levine was presented by Dr Tasia Catchings at our Tumor Board on 04/21/2019, which included representatives from medical oncology, radiation oncology, surgical, radiology, pathology, navigation, internal medicine, pulmonology, research.  Carlos Levine currently presents for new positive pathology, for Carlos Levine, for discussion, as a new patient with history of the following treatments: active survellience, surgical intervention(s).  Additionally, we reviewed previous medical and familial history, history of present illness, and recent lab results along with all available histopathologic and imaging studies. The tumor board considered available treatment options and made the following recommendations: Additional screening, Surgery, Radiation therapy (primary modality) Do PFT's referral for surgical evaluation and XRT if not a surgical candidate  The following procedures/referrals were also placed: No orders of the defined types were placed in this encounter.   Clinical Trial Status: not discussed   Staging used: AJCC Stage Group  AJCC Staging:       Group: Squamous cell Lung cancer  National site-specific guidelines NCCN were discussed with respect to the case.  Tumor board is a meeting of clinicians from various specialty areas who evaluate and discuss patients for whom a multidisciplinary approach is being considered. Final determinations in the plan of care are those of the provider(s). The responsibility for follow up of recommendations given during tumor board is that of the provider.   Today's extended care, comprehensive team conference, Carlos Levine was not present for the discussion and was not examined.   Multidisciplinary Tumor Board is a multidisciplinary case peer review process.  Decisions discussed in the Multidisciplinary Tumor Board reflect the opinions of the specialists present at the conference without having examined the patient.  Ultimately, treatment and diagnostic  decisions rest with the primary provider(s) and the patient.

## 2019-05-03 ENCOUNTER — Other Ambulatory Visit: Payer: Medicare Other

## 2019-05-05 ENCOUNTER — Ambulatory Visit: Payer: Medicare Other | Admitting: Urology

## 2019-05-06 ENCOUNTER — Telehealth: Payer: Self-pay | Admitting: *Deleted

## 2019-05-06 ENCOUNTER — Other Ambulatory Visit: Payer: Self-pay

## 2019-05-06 ENCOUNTER — Ambulatory Visit (INDEPENDENT_AMBULATORY_CARE_PROVIDER_SITE_OTHER): Payer: Medicare Other | Admitting: Cardiothoracic Surgery

## 2019-05-06 ENCOUNTER — Encounter: Payer: Self-pay | Admitting: Cardiothoracic Surgery

## 2019-05-06 VITALS — BP 167/85 | HR 73 | Temp 97.5°F | Ht 71.0 in | Wt 228.0 lb

## 2019-05-06 DIAGNOSIS — R918 Other nonspecific abnormal finding of lung field: Secondary | ICD-10-CM

## 2019-05-06 NOTE — Telephone Encounter (Signed)
Pt left message to call him back regarding a few questions he had.   Called patient back a couple times with no answer and unable to leave a message. Will follow up with pt at next appt on Mon 7/20 at 11am with Dr. Baruch Gouty.

## 2019-05-06 NOTE — Patient Instructions (Signed)
We sent the referral to Radiation Oncology. Someone from their office will contact you to schedule an office appointment.  We will send for a copy of the pulmonary functions test.   Please call our office after you see Dr.Crystal.

## 2019-05-06 NOTE — Progress Notes (Signed)
Patient ID: Carlos Levine, male   DOB: June 05, 1941, 78 y.o.   MRN: [id number]  Chief Complaint  Patient presents with  . Other    Referred By Dr. Kerrin Mo Reason for Referral right upper lobe mass  HPI Location, Quality, Duration, Severity, Timing, Context, Modifying Factors, Associated Signs and Symptoms.  Carlos Levine is a 78 y.o. male.  His problems began several months ago when he began experiencing increasing shortness of breath associated with a productive cough.  He states that he had an extensive work-up done and was treated for a variety of upper respiratory symptoms.  Ultimately his primary care physician relocated and Dr. Kerrin Mo assumed his care and the chest x-ray was ordered.  That revealed a right upper lobe mass.  A subsequent CT scan of the chest and a CT-guided needle biopsy was performed.  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  An MRI was negative for metastatic disease.  A PET scan showed only disease within the right chest.  Interpretation of the CT scan demonstrated some abutment of the posterior pleura along the ribs and chest wall invasion was not ruled out.  In addition the tumor abuts the major fissure and extends along the right lower lobe.  The patient states he did have pulmonary function test which we are unable to see at this time.  He states that he gets short of breath with minimal activities.  He has a dog that he cares for and he walks between 2 and 3 hundred feet and must stop for shortness of breath.  He denied any hemoptysis or weight loss.  When asked if he has a family history of lung cancer he says everyone.  This includes his parents aunts and uncles.  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   Past Medical History:  Diagnosis Date  . Arthritis   . Cancer [facility])    prostate  . Chronic kidney disease   . Diabetes mellitus without complication ([place])   . History of kidney stones   . Hyperlipidemia   . Prostate  cancer [facility])     Past Surgical History:  Procedure Laterality Date  . AMPUTATION FINGER / THUMB Right   . PROSTATE CRYOABLATION    . SPINAL FUSION      Family History  Problem Relation Age of Onset  . Lung cancer Father   . Pancreatic cancer Father   . Brain cancer Mother   . Stomach cancer Paternal Aunt   . Stomach cancer Paternal Uncle   . Cancer Maternal Grandmother   . Kidney cancer Maternal Grandfather     Social History Social History   Tobacco Use  . Smoking status: Former Smoker    Years: 30.00    Types: Cigarettes    Quit date: 10/20/2009    Years since quitting: 9.5  . Smokeless tobacco: Never Used  Substance Use Topics  . Alcohol use: No  . Drug use: No    Allergies  Allergen Reactions  . Iodinated Diagnostic Agents Hives and Rash  . Ace Inhibitors     Other reaction(s): Unknown Other reaction(s): Unknown     Current Outpatient Medications  Medication Sig Dispense Refill  . Blood Glucose Monitoring Suppl (ONE TOUCH ULTRA 2) w/Device KIT Use as instructed.    Marland Kitchen Dextromethorphan-guaiFENesin 10-100 MG/5ML liquid Take 5 mLs by mouth every 12 (twelve) hours. 236 mL 1  . gabapentin (NEURONTIN) 100 MG capsule Take by mouth.    Marland Kitchen  glucose blood (ONE TOUCH ULTRA TEST) test strip TEST SUGAR (TWO) TIMES DAILY.    . pravastatin (PRAVACHOL) 80 MG tablet TAKE 1 TABLET BY MOUTH EVERY DAY AT NIGHT    . traMADol-acetaminophen (ULTRACET) 37.5-325 MG tablet Take 1 tablet by mouth every 8 (eight) hours as needed. for pain     No current facility-administered medications for this visit.       Review of Systems A complete review of systems was asked and was negative except for the following positive findings fatigue, shortness of breath, diabetes, easy bruising, hearing loss, prostate cancer.  Blood pressure (!) 167/85, pulse 73, temperature (!) 97.5 F (36.4 C), temperature source Skin, height '5\' 11"'  (1.803 m), weight 228 lb (103.4 kg), SpO2 97 %.  Physical  Exam CONSTITUTIONAL:  Pleasant, well-developed, well-nourished, and in no acute distress. EYES: Pupils equal and reactive to light, Sclera non-icteric EARS, NOSE, MOUTH AND THROAT:  The oropharynx was clear.  Dentition is poorrepair.  Oral mucosa pink and moist. LYMPH NODES:  Lymph nodes in the neck and axillae were normal RESPIRATORY:  Lungs were clear.  Normal respiratory effort without pathologic use of accessory muscles of respiration CARDIOVASCULAR: Heart was regular with systolic ejection murmurs.  There were no carotid bruits. GI: The abdomen was soft, nontender, and nondistended. There were no palpable masses. There was no hepatosplenomegaly. There were normal bowel sounds in all quadrants. GU:  Rectal deferred.   MUSCULOSKELETAL:  Normal muscle strength and tone.  No clubbing or cyanosis.   SKIN:  There were no pathologic skin lesions.  There were no nodules on palpation. NEUROLOGIC:  Sensation is normal.  Cranial nerves are grossly intact. PSYCH:  Oriented to person, place and time.  Mood and affect are normal.  Data Reviewed CT scan, PET scan, MRI  I have personally reviewed the patient's imaging, laboratory findings and medical records.    Assessment    Right upper lobe mass with possible involvement of the chest wall posteriorly and right lower lobe inferiorly.  Biopsy-proven squamous cell carcinoma    Plan    I had a long discussion with him regarding the indications and risks of surgical intervention.  Risks of bleeding, infection, air leak and death were all reviewed.  Postoperative recovery was also discussed.  Given his advanced age and multiple comorbidities as well as the location of the tumor I thought it be reasonable to solicit the consultation of our colleagues in radiation therapy.  We will go ahead and set those up.  We will also check on his pulmonary function studies that were obtained at the [facility].  He will contact our office.  I gave him my business  card as well as his son who accompanied him here today.  All their questions were answered.       [name], MD 05/06/2019, 8:53 AM

## 2019-05-09 ENCOUNTER — Encounter: Payer: Self-pay | Admitting: Oncology

## 2019-05-09 ENCOUNTER — Telehealth: Payer: Self-pay | Admitting: Cardiothoracic Surgery

## 2019-05-09 ENCOUNTER — Ambulatory Visit
Admission: RE | Admit: 2019-05-09 | Discharge: 2019-05-09 | Disposition: A | Discharge status: Home / Self Care | Payer: Medicare Other | Source: Ambulatory Visit | Attending: Radiation Oncology | Admitting: Radiation Oncology

## 2019-05-09 ENCOUNTER — Encounter: Payer: Self-pay | Admitting: Radiation Oncology

## 2019-05-09 ENCOUNTER — Other Ambulatory Visit: Payer: Self-pay

## 2019-05-09 VITALS — BP 161/85 | HR 80 | Temp 97.6°F | Resp 18 | Wt 225.5 lb

## 2019-05-09 DIAGNOSIS — Z8051 Family history of malignant neoplasm of kidney: Secondary | ICD-10-CM | POA: Diagnosis not present

## 2019-05-09 DIAGNOSIS — Z8546 Personal history of malignant neoplasm of prostate: Secondary | ICD-10-CM | POA: Diagnosis not present

## 2019-05-09 DIAGNOSIS — M129 Arthropathy, unspecified: Secondary | ICD-10-CM | POA: Insufficient documentation

## 2019-05-09 DIAGNOSIS — Z87442 Personal history of urinary calculi: Secondary | ICD-10-CM | POA: Diagnosis not present

## 2019-05-09 DIAGNOSIS — C3411 Malignant neoplasm of upper lobe, right bronchus or lung: Secondary | ICD-10-CM | POA: Diagnosis present

## 2019-05-09 DIAGNOSIS — Z79899 Other long term (current) drug therapy: Secondary | ICD-10-CM | POA: Diagnosis not present

## 2019-05-09 DIAGNOSIS — Z800 Family history of malignant neoplasm of digestive organs: Secondary | ICD-10-CM | POA: Diagnosis not present

## 2019-05-09 DIAGNOSIS — Z808 Family history of malignant neoplasm of other organs or systems: Secondary | ICD-10-CM | POA: Diagnosis not present

## 2019-05-09 DIAGNOSIS — N189 Chronic kidney disease, unspecified: Secondary | ICD-10-CM | POA: Insufficient documentation

## 2019-05-09 DIAGNOSIS — Z87891 Personal history of nicotine dependence: Secondary | ICD-10-CM | POA: Insufficient documentation

## 2019-05-09 DIAGNOSIS — E785 Hyperlipidemia, unspecified: Secondary | ICD-10-CM | POA: Diagnosis not present

## 2019-05-09 DIAGNOSIS — Z801 Family history of malignant neoplasm of trachea, bronchus and lung: Secondary | ICD-10-CM | POA: Insufficient documentation

## 2019-05-09 DIAGNOSIS — E1122 Type 2 diabetes mellitus with diabetic chronic kidney disease: Secondary | ICD-10-CM | POA: Insufficient documentation

## 2019-05-09 DIAGNOSIS — C3491 Malignant neoplasm of unspecified part of right bronchus or lung: Secondary | ICD-10-CM

## 2019-05-09 NOTE — Consult Note (Signed)
NEW PATIENT EVALUATION  Name: Carlos Levine  MRN: [id number]  Date:   05/09/2019     DOB: 1941/04/09   This 78 y.o. 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  REFERRING PHYSICIAN: [name], MD  CHIEF COMPLAINT:  Chief Complaint  Patient presents with  . Lung Cancer    Initial Eval    DIAGNOSIS: The encounter diagnosis was Squamous cell lung cancer, right ([place]).   PREVIOUS INVESTIGATIONS:  PET scan and CT scans reviewed Clinical notes reviewed Surgical pathology report reviewed  HPI: Patient is a 78 year old male who presented several months prior with exacerbation of increasing shortness of breath with a productive cough clear.  Chest x-ray revealed a right upper lobe mass which was confirmed on CT scan.  CT-guided needle biopsy was positive for squamous cell carcinoma.  PET scan showed hypermetabolic lesion in the right chest in the area of prior biopsy.  There was some abutment of the posterior pleura and chest wall invasion was not ruled out.  Patient is been evaluated by Dr. Faith Rogue.  He has dyspnea on exertion with minimal exertion his FEV1 is been performed although not available for my review at this time.  He is referred by Dr. Faith Rogue who is attending away for thoracotomy.  PLANNED TREATMENT REGIMEN: SBRT  PAST MEDICAL HISTORY:  has a past medical history of Arthritis, Cancer ([place]), Chronic kidney disease, Diabetes mellitus without complication ([place]), History of kidney stones, Hyperlipidemia, and Prostate cancer ([place]).    PAST SURGICAL HISTORY:  Past Surgical History:  Procedure Laterality Date  . AMPUTATION FINGER / THUMB Right   . PROSTATE CRYOABLATION    . SPINAL FUSION      FAMILY HISTORY: family history includes Brain cancer in his mother; Cancer in his maternal grandmother; Kidney cancer in his maternal grandfather; Lung cancer in his father; Pancreatic  cancer in his father; Stomach cancer in his paternal aunt and paternal uncle.  SOCIAL HISTORY:  reports that he quit smoking about 9 years ago. His smoking use included cigarettes. He quit after 30.00 years of use. He has never used smokeless tobacco. He reports that he does not drink alcohol or use drugs.  ALLERGIES: Iodinated diagnostic agents and Ace inhibitors  MEDICATIONS:  Current Outpatient Medications  Medication Sig Dispense Refill  . Blood Glucose Monitoring Suppl (ONE TOUCH ULTRA 2) w/Device KIT Use as instructed.    Marland Kitchen Dextromethorphan-guaiFENesin 10-100 MG/5ML liquid Take 5 mLs by mouth every 12 (twelve) hours. 236 mL 1  . gabapentin (NEURONTIN) 100 MG capsule Take by mouth.    Marland Kitchen glucose blood (ONE TOUCH ULTRA TEST) test strip TEST SUGAR (TWO) TIMES DAILY.    . pravastatin (PRAVACHOL) 80 MG tablet TAKE 1 TABLET BY MOUTH EVERY DAY AT NIGHT    . traMADol-acetaminophen (ULTRACET) 37.5-325 MG tablet Take 1 tablet by mouth every 8 (eight) hours as needed. for pain     No current facility-administered medications for this encounter.     ECOG PERFORMANCE STATUS:  0 - Asymptomatic  REVIEW OF SYSTEMS: Patient denies any weight loss, fatigue, weakness, fever, chills or night sweats. Patient denies any loss of vision, blurred vision. Patient denies any ringing  of the ears or hearing loss. No irregular heartbeat. Patient denies heart murmur or history of fainting. Patient denies any chest pain or pain radiating to her upper extremities. Patient denies any shortness of breath, difficulty breathing at  night, cough or hemoptysis. Patient denies any swelling in the lower legs. Patient denies any nausea vomiting, vomiting of blood, or coffee ground material in the vomitus. Patient denies any stomach pain. Patient states has had normal bowel movements no significant constipation or diarrhea. Patient denies any dysuria, hematuria or significant nocturia. Patient denies any problems walking, swelling  in the joints or loss of balance. Patient denies any skin changes, loss of hair or loss of weight. Patient denies any excessive worrying or anxiety or significant depression. Patient denies any problems with insomnia. Patient denies excessive thirst, polyuria, polydipsia. Patient denies any swollen glands, patient denies easy bruising or easy bleeding. Patient denies any recent infections, allergies or URI. Patient "s visual fields have not changed significantly in recent time.   PHYSICAL EXAM: BP (!) 161/85   Pulse 80   Temp 97.6 F (36.4 C)   Resp 18   Wt 225 lb 8.5 oz (102.3 kg)   BMI 31.46 kg/m  Well-developed well-nourished patient in NAD. HEENT reveals PERLA, EOMI, discs not visualized.  Oral cavity is clear. No oral mucosal lesions are identified. Neck is clear without evidence of cervical or supraclavicular adenopathy. Lungs are clear to A&P. Cardiac examination is essentially unremarkable with regular rate and rhythm without murmur rub or thrill. Abdomen is benign with no organomegaly or masses noted. Motor sensory and DTR levels are equal and symmetric in the upper and lower extremities. Cranial nerves II through XII are grossly intact. Proprioception is intact. No peripheral adenopathy or edema is identified. No motor or sensory levels are noted. Crude visual fields are within normal range.  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  PLAN: At this time I have recommended SBRT.  Would plan on delivering 6000 cGy in 5 fractions.  I would use motion restriction as well as 4-dimensional treatment planning during CT simulation.  Risks and benefits of treatment including possible  exacerbation of cough fatigue loss of some normal lung volume all were discussed gust in detail with the patient.  He seems to comprehend my treatment plan well.  I have personally set him up and ordered CT simulation for later this week.  Patient knows to call with any concerns at any time.  I would like to take this opportunity to thank you for allowing me to participate in the care of your patient.Noreene Filbert, MD

## 2019-05-09 NOTE — Telephone Encounter (Signed)
Patient called and said he did not want to be schedule for surgery.

## 2019-05-11 ENCOUNTER — Ambulatory Visit: Payer: Medicare Other

## 2019-05-13 ENCOUNTER — Other Ambulatory Visit: Payer: Self-pay | Admitting: Pulmonary Disease

## 2019-05-13 DIAGNOSIS — C3490 Malignant neoplasm of unspecified part of unspecified bronchus or lung: Secondary | ICD-10-CM

## 2019-05-23 ENCOUNTER — Ambulatory Visit: Payer: Medicare Other

## 2019-05-24 ENCOUNTER — Other Ambulatory Visit: Payer: Self-pay

## 2019-05-24 ENCOUNTER — Ambulatory Visit
Admission: RE | Admit: 2019-05-24 | Discharge: 2019-05-24 | Disposition: A | Discharge status: Home / Self Care | Payer: Medicare Other | Source: Ambulatory Visit | Attending: Pulmonary Disease | Admitting: Pulmonary Disease

## 2019-05-24 DIAGNOSIS — C3490 Malignant neoplasm of unspecified part of unspecified bronchus or lung: Secondary | ICD-10-CM | POA: Insufficient documentation

## 2019-05-25 ENCOUNTER — Ambulatory Visit: Payer: Medicare Other

## 2019-05-26 ENCOUNTER — Other Ambulatory Visit: Payer: Self-pay

## 2019-05-26 ENCOUNTER — Encounter
Admission: RE | Admit: 2019-05-26 | Discharge: 2019-05-26 | Disposition: A | Discharge status: Home / Self Care | Payer: Medicare Other | Source: Ambulatory Visit | Attending: Pulmonary Disease | Admitting: Pulmonary Disease

## 2019-05-26 ENCOUNTER — Encounter: Payer: Self-pay | Admitting: Emergency Medicine

## 2019-05-26 ENCOUNTER — Inpatient Hospital Stay
Admission: EM | Admit: 2019-05-26 | Discharge: 2019-05-27 | DRG: 309 | Disposition: A | Discharge status: Home / Self Care | Payer: Medicare Other | Attending: Internal Medicine | Admitting: Internal Medicine

## 2019-05-26 ENCOUNTER — Emergency Department: Payer: Medicare Other

## 2019-05-26 DIAGNOSIS — Z8546 Personal history of malignant neoplasm of prostate: Secondary | ICD-10-CM

## 2019-05-26 DIAGNOSIS — E1122 Type 2 diabetes mellitus with diabetic chronic kidney disease: Secondary | ICD-10-CM | POA: Diagnosis present

## 2019-05-26 DIAGNOSIS — R0602 Shortness of breath: Secondary | ICD-10-CM | POA: Diagnosis present

## 2019-05-26 DIAGNOSIS — R001 Bradycardia, unspecified: Secondary | ICD-10-CM

## 2019-05-26 DIAGNOSIS — Z20828 Contact with and (suspected) exposure to other viral communicable diseases: Secondary | ICD-10-CM | POA: Diagnosis not present

## 2019-05-26 DIAGNOSIS — Z79899 Other long term (current) drug therapy: Secondary | ICD-10-CM

## 2019-05-26 DIAGNOSIS — C3491 Malignant neoplasm of unspecified part of right bronchus or lung: Secondary | ICD-10-CM | POA: Diagnosis not present

## 2019-05-26 DIAGNOSIS — E785 Hyperlipidemia, unspecified: Secondary | ICD-10-CM | POA: Diagnosis present

## 2019-05-26 DIAGNOSIS — Z800 Family history of malignant neoplasm of digestive organs: Secondary | ICD-10-CM | POA: Diagnosis not present

## 2019-05-26 DIAGNOSIS — Z888 Allergy status to other drugs, medicaments and biological substances status: Secondary | ICD-10-CM | POA: Diagnosis not present

## 2019-05-26 DIAGNOSIS — Z808 Family history of malignant neoplasm of other organs or systems: Secondary | ICD-10-CM

## 2019-05-26 DIAGNOSIS — Z8051 Family history of malignant neoplasm of kidney: Secondary | ICD-10-CM

## 2019-05-26 DIAGNOSIS — Z981 Arthrodesis status: Secondary | ICD-10-CM

## 2019-05-26 DIAGNOSIS — I129 Hypertensive chronic kidney disease with stage 1 through stage 4 chronic kidney disease, or unspecified chronic kidney disease: Secondary | ICD-10-CM | POA: Diagnosis not present

## 2019-05-26 DIAGNOSIS — M1990 Unspecified osteoarthritis, unspecified site: Secondary | ICD-10-CM | POA: Diagnosis not present

## 2019-05-26 DIAGNOSIS — Z801 Family history of malignant neoplasm of trachea, bronchus and lung: Secondary | ICD-10-CM

## 2019-05-26 DIAGNOSIS — I471 Supraventricular tachycardia, unspecified: Secondary | ICD-10-CM | POA: Diagnosis present

## 2019-05-26 DIAGNOSIS — Z87891 Personal history of nicotine dependence: Secondary | ICD-10-CM

## 2019-05-26 DIAGNOSIS — Z809 Family history of malignant neoplasm, unspecified: Secondary | ICD-10-CM

## 2019-05-26 DIAGNOSIS — N183 Chronic kidney disease, stage 3 (moderate): Secondary | ICD-10-CM | POA: Diagnosis present

## 2019-05-26 LAB — COMPREHENSIVE METABOLIC PANEL
ALT: 10 U/L (ref 0–44)
AST: 17 U/L (ref 15–41)
Albumin: 3.9 g/dL (ref 3.5–5.0)
Alkaline Phosphatase: 48 U/L (ref 38–126)
Anion gap: 11 (ref 5–15)
BUN: 28 mg/dL — ABNORMAL HIGH (ref 8–23)
CO2: 19 mmol/L — ABNORMAL LOW (ref 22–32)
Calcium: 9 mg/dL (ref 8.9–10.3)
Chloride: 108 mmol/L (ref 98–111)
Creatinine, Ser: 1.87 mg/dL — ABNORMAL HIGH (ref 0.61–1.24)
GFR calc Af Amer: 39 mL/min — ABNORMAL LOW (ref >60)
GFR calc non Af Amer: 33 mL/min — ABNORMAL LOW (ref >60)
Glucose, Bld: 279 mg/dL — ABNORMAL HIGH (ref 70–99)
Potassium: 4.6 mmol/L (ref 3.5–5.1)
Sodium: 138 mmol/L (ref 135–145)
Total Bilirubin: 0.9 mg/dL (ref 0.3–1.2)
Total Protein: 8.1 g/dL (ref 6.5–8.1)

## 2019-05-26 LAB — APTT: aPTT: 36 seconds (ref 24–36)

## 2019-05-26 LAB — CBC WITH DIFFERENTIAL/PLATELET
Abs Immature Granulocytes: 0.06 10*3/uL (ref 0.00–0.07)
Basophils Absolute: 0.1 10*3/uL (ref 0.0–0.1)
Basophils Relative: 0 %
Eosinophils Absolute: 0.1 10*3/uL (ref 0.0–0.5)
Eosinophils Relative: 1 %
HCT: 44.5 % (ref 39.0–52.0)
Hemoglobin: 14.5 g/dL (ref 13.0–17.0)
Immature Granulocytes: 1 %
Lymphocytes Relative: 24 %
Lymphs Abs: 3.1 10*3/uL (ref 0.7–4.0)
MCH: 30 pg (ref 26.0–34.0)
MCHC: 32.6 g/dL (ref 30.0–36.0)
MCV: 92.1 fL (ref 80.0–100.0)
Monocytes Absolute: 1.3 10*3/uL — ABNORMAL HIGH (ref 0.1–1.0)
Monocytes Relative: 10 %
Neutro Abs: 8.3 10*3/uL — ABNORMAL HIGH (ref 1.7–7.7)
Neutrophils Relative %: 64 %
Platelets: 228 10*3/uL (ref 150–400)
RBC: 4.83 MIL/uL (ref 4.22–5.81)
RDW: 12.9 % (ref 11.5–15.5)
WBC: 12.9 10*3/uL — ABNORMAL HIGH (ref 4.0–10.5)
nRBC: 0 % (ref 0.0–0.2)

## 2019-05-26 LAB — TROPONIN I (HIGH SENSITIVITY)
Troponin I (High Sensitivity): 18 ng/L — ABNORMAL HIGH (ref <18)
Troponin I (High Sensitivity): 23 ng/L — ABNORMAL HIGH (ref <18)

## 2019-05-26 LAB — ETHANOL: Alcohol, Ethyl (B): <10 mg/dL (ref <10)

## 2019-05-26 LAB — SARS CORONAVIRUS 2 BY RT PCR (HOSPITAL ORDER, PERFORMED IN ~~LOC~~ HOSPITAL LAB): SARS Coronavirus 2: NEGATIVE

## 2019-05-26 LAB — MAGNESIUM: Magnesium: 2 mg/dL (ref 1.7–2.4)

## 2019-05-26 LAB — PROTIME-INR
INR: 1.1 (ref 0.8–1.2)
Prothrombin Time: 14 seconds (ref 11.4–15.2)

## 2019-05-26 LAB — GLUCOSE, CAPILLARY
Glucose-Capillary: 110 mg/dL — ABNORMAL HIGH (ref 70–99)
Glucose-Capillary: 158 mg/dL — ABNORMAL HIGH (ref 70–99)

## 2019-05-26 LAB — CK: Total CK: 73 U/L (ref 49–397)

## 2019-05-26 LAB — LIPASE, BLOOD: Lipase: 28 U/L (ref 11–51)

## 2019-05-26 LAB — TSH: TSH: 2.945 u[IU]/mL (ref 0.350–4.500)

## 2019-05-26 LAB — ACETAMINOPHEN LEVEL: Acetaminophen (Tylenol), Serum: <10 ug/mL — ABNORMAL LOW (ref 10–30)

## 2019-05-26 LAB — HEMOGLOBIN A1C
Hgb A1c MFr Bld: 7.4 % — ABNORMAL HIGH (ref 4.8–5.6)
Mean Plasma Glucose: 165.68 mg/dL

## 2019-05-26 LAB — SALICYLATE LEVEL: Salicylate Lvl: <7 mg/dL (ref 2.8–30.0)

## 2019-05-26 LAB — MRSA PCR SCREENING: MRSA by PCR: NEGATIVE

## 2019-05-26 LAB — PHOSPHORUS: Phosphorus: 2.7 mg/dL (ref 2.5–4.6)

## 2019-05-26 MED ORDER — DILTIAZEM HCL 25 MG/5ML IV SOLN
15.0000 mg | Freq: Once | INTRAVENOUS | Status: AC
  Administered 2019-05-26: 09:00:00 7.5 mg via INTRAVENOUS
  Filled 2019-05-26: qty 5

## 2019-05-26 MED ORDER — HEPARIN SODIUM (PORCINE) 5000 UNIT/ML IJ SOLN
5000.0000 [IU] | Freq: Three times a day (TID) | INTRAMUSCULAR | Status: DC
  Administered 2019-05-26 – 2019-05-27 (×3): 5000 [IU] via SUBCUTANEOUS
  Filled 2019-05-26 (×3): qty 1

## 2019-05-26 MED ORDER — ACETAMINOPHEN 325 MG PO TABS: 650.0000 mg | ORAL_TABLET | Freq: Four times a day (QID) | ORAL | Status: DC | PRN

## 2019-05-26 MED ORDER — SENNOSIDES-DOCUSATE SODIUM 8.6-50 MG PO TABS: 1.0000 | ORAL_TABLET | Freq: Every evening | ORAL | Status: DC | PRN

## 2019-05-26 MED ORDER — ONDANSETRON HCL 4 MG PO TABS: 4.0000 mg | ORAL_TABLET | Freq: Four times a day (QID) | ORAL | Status: DC | PRN

## 2019-05-26 MED ORDER — INSULIN ASPART 100 UNIT/ML ~~LOC~~ SOLN: 0.0000 [IU] | Freq: Three times a day (TID) | SUBCUTANEOUS | Status: DC

## 2019-05-26 MED ORDER — SODIUM CHLORIDE 0.9 % IV BOLUS
1000.0000 mL | Freq: Once | INTRAVENOUS | Status: AC
  Administered 2019-05-26: 09:00:00 1000 mL via INTRAVENOUS

## 2019-05-26 MED ORDER — CHLORHEXIDINE GLUCONATE CLOTH 2 % EX PADS: 6.0000 | MEDICATED_PAD | Freq: Every day | CUTANEOUS | Status: DC

## 2019-05-26 MED ORDER — HYDROCODONE-ACETAMINOPHEN 5-325 MG PO TABS: 1.0000 | ORAL_TABLET | ORAL | Status: DC | PRN

## 2019-05-26 MED ORDER — IOHEXOL 350 MG/ML SOLN
75.0000 mL | Freq: Once | INTRAVENOUS | Status: AC | PRN
  Administered 2019-05-26: 10:00:00 60 mL via INTRAVENOUS

## 2019-05-26 MED ORDER — ONDANSETRON HCL 4 MG/2ML IJ SOLN: 4.0000 mg | Freq: Four times a day (QID) | INTRAMUSCULAR | Status: DC | PRN

## 2019-05-26 MED ORDER — METHYLPREDNISOLONE SODIUM SUCC 125 MG IJ SOLR: 125.0000 mg | Freq: Once | INTRAMUSCULAR | Status: DC

## 2019-05-26 MED ORDER — ADENOSINE 6 MG/2ML IV SOLN
INTRAVENOUS | Status: AC
  Filled 2019-05-26: qty 2

## 2019-05-26 MED ORDER — ADENOSINE 12 MG/4ML IV SOLN
INTRAVENOUS | Status: AC
  Filled 2019-05-26: qty 4

## 2019-05-26 MED ORDER — ACETAMINOPHEN 650 MG RE SUPP: 650.0000 mg | Freq: Four times a day (QID) | RECTAL | Status: DC | PRN

## 2019-05-26 MED ORDER — PRAVASTATIN SODIUM 20 MG PO TABS
80.0000 mg | ORAL_TABLET | Freq: Every day | ORAL | Status: DC
  Administered 2019-05-26: 23:00:00 80 mg via ORAL
  Filled 2019-05-26: qty 4

## 2019-05-26 MED ORDER — ADENOSINE 6 MG/2ML IV SOLN: 6.0000 mg | Freq: Once | INTRAVENOUS | Status: DC

## 2019-05-26 MED ORDER — DIPHENHYDRAMINE HCL 50 MG/ML IJ SOLN: 50.0000 mg | Freq: Once | INTRAMUSCULAR | Status: DC

## 2019-05-26 MED ORDER — BICALUTAMIDE 50 MG PO TABS
50.0000 mg | ORAL_TABLET | Freq: Every day | ORAL | Status: DC
  Filled 2019-05-26: qty 1

## 2019-05-26 NOTE — Pre-Procedure Instructions (Signed)
VS in PAT revealed heart rate of 155-168.   Mild SOB.  No chest pain.  EKG obtained and patient transferred to the ER for evaluation.  No previous cardiac history.  Does not have a cardiologist. Wife notified that patient will be evaluated in ER and staff will keep her posted at home.

## 2019-05-26 NOTE — ED Notes (Signed)
ED TO INPATIENT HANDOFF REPORT  ED Nurse Name and Phone #: Lamanda Rudder [phone]  S Name/Age/Gender Carlos Levine 78 y.o. male Room/Bed: ED08A/ED08A  Code Status   Code Status: Prior  Home/SNF/Other Home Patient oriented to: self, place, time and situation Is this baseline? Yes   Triage Complete: Triage complete  Chief Complaint chest pain   Triage Note Patient presents to the ED from pre-admit testing due to an EKG showing SVT.  Patient has cancer and was getting testing prior to a bronchoscopy.  Patient states a mass was found on his lung approx. 1 month ago.  Patient reports shortness of breath x 4 months.  Patient states shortness of breath is worse today.  Patient also states he feels like his heart is beating fast.  Patient appears somewhat pale.     Allergies Allergies  Allergen Reactions  . Ace Inhibitors Other (See Comments)    Unknown reaction type    Level of Care/Admitting Diagnosis ED Disposition    ED Disposition Condition [facility]: [place] [[id number]]  Level of Care: Stepdown [14]  Covid Evaluation: N/A  Diagnosis: SVT (supraventricular tachycardia) [facility]) [[id number]]  Admitting Physician: Bettey Costa [[id number]]  Attending Physician: MODY, Ulice Bold [[id number]]  Estimated length of stay: past midnight tomorrow  Certification:: I certify this patient will need inpatient services for at least 2 midnights  PT Class (Do Not Modify): Inpatient [101]  PT Acc Code (Do Not Modify): Private [1]       B Medical/Surgery History Past Medical History:  Diagnosis Date  . Arthritis   . Cancer [facility])    prostate  . Chronic kidney disease   . Diabetes mellitus without complication ([place])   . History of kidney stones   . Hyperlipidemia   . Prostate cancer [facility])    Past Surgical History:  Procedure Laterality Date  . AMPUTATION FINGER / THUMB Right   . PROSTATE CRYOABLATION    . SPINAL FUSION       A IV  Location/Drains/Wounds Patient Lines/Drains/Airways Status   Active Line/Drains/Airways    Name:   Placement date:   Placement time:   Site:   Days:   Peripheral IV 05/26/19 Right Forearm   05/26/19    0855    Forearm   less than 1          Intake/Output Last 24 hours No intake or output data in the 24 hours ending 05/26/19 1344  Labs/Imaging Results for orders placed or performed during the hospital encounter of 05/26/19 (from the past 48 hour(s))  Comprehensive metabolic panel     Status: Abnormal   Collection Time: 05/26/19  8:47 AM  Result Value Ref Range   Sodium 138 135 - 145 mmol/L   Potassium 4.6 3.5 - 5.1 mmol/L   Chloride 108 98 - 111 mmol/L   CO2 19 (L) 22 - 32 mmol/L   Glucose, Bld 279 (H) 70 - 99 mg/dL   BUN 28 (H) 8 - 23 mg/dL   Creatinine, Ser 1.87 (H) 0.61 - 1.24 mg/dL   Calcium 9.0 8.9 - 10.3 mg/dL   Total Protein 8.1 6.5 - 8.1 g/dL   Albumin 3.9 3.5 - 5.0 g/dL   AST 17 15 - 41 U/L   ALT 10 0 - 44 U/L   Alkaline Phosphatase 48 38 - 126 U/L   Total Bilirubin 0.9 0.3 - 1.2 mg/dL   GFR calc non Af Amer 33 (L) >60 mL/min  GFR calc Af Amer 39 (L) >60 mL/min   Anion gap 11 5 - 15    Comment: Performed at [facility], [address] [place], [place] [address]  Lipase, blood     Status: None   Collection Time: 05/26/19  8:47 AM  Result Value Ref Range   Lipase 28 11 - 51 U/L    Comment: Performed at [facility], [place], [place] [address]  Troponin I (High Sensitivity)     Status: Abnormal   Collection Time: 05/26/19  8:47 AM  Result Value Ref Range   Troponin I (High Sensitivity) 18 (H) <18 ng/L    Comment: (NOTE) Elevated high sensitivity troponin I (hsTnI) values and significant  changes across serial measurements may suggest ACS but many other  chronic and acute conditions are known to elevate hsTnI results.  Refer to the "Links" section for chest pain algorithms and additional  guidance. Performed at [facility], [address] [place], [place] [address]   CBC with Differential     Status: Abnormal   Collection Time: 05/26/19  8:47 AM  Result Value Ref Range   WBC 12.9 (H) 4.0 - 10.5 K/uL   RBC 4.83 4.22 - 5.81 MIL/uL   Hemoglobin 14.5 13.0 - 17.0 g/dL   HCT 44.5 39.0 - 52.0 %   MCV 92.1 80.0 - 100.0 fL   MCH 30.0 26.0 - 34.0 pg   MCHC 32.6 30.0 - 36.0 g/dL   RDW 12.9 11.5 - 15.5 %   Platelets 228 150 - 400 K/uL   nRBC 0.0 0.0 - 0.2 %   Neutrophils Relative % 64 %   Neutro Abs 8.3 (H) 1.7 - 7.7 K/uL   Lymphocytes Relative 24 %   Lymphs Abs 3.1 0.7 - 4.0 K/uL   Monocytes Relative 10 %   Monocytes Absolute 1.3 (H) 0.1 - 1.0 K/uL   Eosinophils Relative 1 %   Eosinophils Absolute 0.1 0.0 - 0.5 K/uL   Basophils Relative 0 %   Basophils Absolute 0.1 0.0 - 0.1 K/uL   Immature Granulocytes 1 %   Abs Immature Granulocytes 0.06 0.00 - 0.07 K/uL    Comment: Performed at [facility], [address] [place], [place] [address]  CK     Status: None   Collection Time: 05/26/19  8:47 AM  Result Value Ref Range   Total CK 73 49 - 397 U/L    Comment: Performed at [facility], [address], [place], [place] [address]  Magnesium     Status: None   Collection Time: 05/26/19  8:47 AM  Result Value Ref Range   Magnesium 2.0 1.7 - 2.4 mg/dL    Comment: Performed at [facility], [address], [place], [place] [address]  Phosphorus     Status: None   Collection Time: 05/26/19  8:47 AM  Result Value Ref Range   Phosphorus 2.7 2.5 - 4.6 mg/dL    Comment: Performed at [facility], [address] [place],  [address]  SARS Coronavirus 2 [facility] order, Performed in [facility] hospital lab) Nasopharyngeal Nasopharyngeal Swab     Status: None   Collection Time: 05/26/19  9:29 AM   Specimen: Nasopharyngeal Swab  Result Value Ref Range   SARS Coronavirus 2 NEGATIVE NEGATIVE    Comment: (NOTE) If result is NEGATIVE SARS-CoV-2 target  nucleic acids are NOT DETECTED. The SARS-CoV-2 RNA is generally detectable in upper and lower  respiratory specimens during the acute phase of infection. The lowest  concentration of SARS-CoV-2 viral copies this assay can detect is 250  copies / mL. A negative result does not preclude SARS-CoV-2 infection  and should not be used as the sole basis for treatment or other  patient management decisions.  A negative result may occur with  improper specimen collection / handling, submission of specimen other  than nasopharyngeal swab, presence of viral mutation(s) within the  areas targeted by this assay, and inadequate number of viral copies  (<250 copies / mL). A negative result must be combined with clinical  observations, patient history, and epidemiological information. If result is POSITIVE SARS-CoV-2 target nucleic acids are DETECTED. The SARS-CoV-2 RNA is generally detectable in upper and lower  respiratory specimens dur ing the acute phase of infection.  Positive  results are indicative of active infection with SARS-CoV-2.  Clinical  correlation with patient history and other diagnostic information is  necessary to determine patient infection status.  Positive results do  not rule out bacterial infection or co-infection with other viruses. If result is PRESUMPTIVE POSTIVE SARS-CoV-2 nucleic acids MAY BE PRESENT.   A presumptive positive result was obtained on the submitted specimen  and confirmed on repeat testing.  While 2019 novel coronavirus  (SARS-CoV-2) nucleic acids may be present in the submitted sample  additional confirmatory testing may be necessary for epidemiological  and / or clinical management purposes  to differentiate between  SARS-CoV-2 and other Sarbecovirus currently known to infect humans.  If clinically indicated additional testing with an alternate test  methodology [phone]) is advised. The SARS-CoV-2 RNA is generally  detectable in upper and lower  respiratory sp ecimens during the acute  phase of infection. The expected result is Negative. Fact Sheet for Patients:  StrictlyIdeas.no Fact Sheet for Healthcare Providers: BankingDealers.co.za This test is not yet approved or cleared by the [place] FDA and has been authorized for detection and/or diagnosis of SARS-CoV-2 by FDA under an Emergency Use Authorization (EUA).  This EUA will remain in effect (meaning this test can be used) for the duration of the COVID-19 declaration under Section 564(b)(1) of the Act, 21 U.S.C. section 360bbb-3(b)(1), unless the authorization is terminated or revoked sooner. Performed at [facility], [address] [place], [place] [address]   Acetaminophen level     Status: Abnormal   Collection Time: 05/26/19 11:17 AM  Result Value Ref Range   Acetaminophen (Tylenol), Serum <10 (L) 10 - 30 ug/mL    Comment: (NOTE) Therapeutic concentrations vary significantly. A range of 10-30 ug/mL  may be an effective concentration for many patients. However, some  are best treated at concentrations outside of this range. Acetaminophen concentrations >150 ug/mL at 4 hours after ingestion  and >50 ug/mL at 12 hours after ingestion are often associated with  toxic reactions. Performed at [facility], [address] [place], [place] [address]   Ethanol     Status: None   Collection Time: 05/26/19 11:17 AM  Result Value Ref Range   Alcohol, Ethyl (B) <10 <10 mg/dL    Comment: (NOTE) Lowest detectable limit for serum alcohol is 10 mg/dL. For medical purposes only. Performed at [facility], [address] [place], [place] [address]   Salicylate level     Status: None   Collection Time: 05/26/19 11:17 AM  Result Value Ref Range   Salicylate Lvl <3.1 2.8 - 30.0 mg/dL    Comment: Performed at [facility],  [place], [place] [address]  Troponin I (High  Sensitivity)     Status: Abnormal   Collection Time: 05/26/19 11:18 AM  Result Value Ref Range   Troponin I (High Sensitivity) 23 (H) <18 ng/L    Comment: (NOTE) Elevated high sensitivity troponin I (hsTnI) values and significant  changes across serial measurements may suggest ACS but many other  chronic and acute conditions are known to elevate hsTnI results.  Refer to the "Links" section for chest pain algorithms and additional  guidance. Performed at [facility], [place],  [address]    Ct Angio Chest Pe W And/or Wo Contrast  Result Date: 05/26/2019 CLINICAL DATA:  Short of breath tachycardia. History of prostate cancer. Lung mass. EXAM: CT ANGIOGRAPHY CHEST WITH CONTRAST TECHNIQUE: Multidetector CT imaging of the chest was performed using the standard protocol during bolus administration of intravenous contrast. Multiplanar CT image reconstructions and MIPs were obtained to evaluate the vascular anatomy. CONTRAST:  64mL OMNIPAQUE IOHEXOL 350 MG/ML SOLN COMPARISON:  CT chest 05/24/2019 FINDINGS: Cardiovascular: Negative for pulmonary embolism. Pulmonary arteries normal in caliber with normal enhancement Atherosclerotic aorta without aneurysm. Extensive coronary calcification. Heart size normal without pericardial effusion. Mediastinum/Nodes: Negative for mass or adenopathy. Lungs/Pleura: Spiculated mass right posterior apical segment measuring approximately 4.2 by 3.7 cm. Small area of central cavitation. Lung mass abuts the pleural surface and is similar in size to the recent study. No pleural effusion. No other lung nodules. Negative for pneumonia. Apically emphysema bilaterally. Upper Abdomen: Gallstones. Musculoskeletal: No acute skeletal abnormality. Review of the MIP images confirms the above findings. IMPRESSION: 1. Negative for pulmonary embolism 2. Atherosclerotic aorta.  Extensive coronary calcification 3. Spiculated mass right posterior apical segment  unchanged. Findings compatible with carcinoma of the lung. 4. Cholelithiasis. Electronically Signed   By: Franchot Gallo M.D.   On: 05/26/2019 10:35    Pending Labs Unresulted Labs (From admission, onward)    Start     Ordered   05/26/19 1341  TSH  Once,   STAT     05/26/19 1340   05/26/19 1323  Hemoglobin A1c  Once,   STAT    Comments: To assess prior glycemic control    05/26/19 1322   Signed and Held  CBC  (heparin)  Once,   R    Comments: Baseline for heparin therapy IF NOT ALREADY DRAWN.  Notify MD if PLT < 100 K.    Signed and Held   Signed and Held  Creatinine, serum  (heparin)  Once,   R    Comments: Baseline for heparin therapy IF NOT ALREADY DRAWN.    Signed and Held   Signed and Held  Basic metabolic panel  Tomorrow morning,   R     Signed and Held   Signed and Held  CBC  Tomorrow morning,   R     Signed and Held          Vitals/Pain Today's Vitals   05/26/19 1000 05/26/19 1100 05/26/19 1200 05/26/19 1300  BP: 126/80 (!) 149/65 137/75 136/72  Pulse: 68 (!) 40 (!) 49 (!) 43  Resp: 12 (!) 25 12 13   Temp:      TempSrc:      SpO2: 97% 100% 98% 99%  Weight:      Height:      PainSc:        Isolation Precautions No active isolations  Medications Medications  insulin aspart (novoLOG) injection 0-9 Units (has  no administration in time range)  sodium chloride 0.9 % bolus 1,000 mL (0 mLs Intravenous Stopped 05/26/19 1300)  diltiazem (CARDIZEM) injection 15 mg (7.5 mg Intravenous Given 05/26/19 0908)  iohexol (OMNIPAQUE) 350 MG/ML injection 75 mL (60 mLs Intravenous Contrast Given 05/26/19 0959)    Mobility walks Low fall risk   Focused Assessments Cardiac Assessment Handoff:  Cardiac Rhythm: Supraventricular tachycardia Lab Results  Component Value Date   CKTOTAL 73 05/26/2019   TROPONINI 0.09 ([place]) 06/12/2017   No results found for: DDIMER Does the Patient currently have chest pain? No     R Recommendations: See Admitting Provider Note  Report  given to:   Additional Notes:

## 2019-05-26 NOTE — Progress Notes (Signed)
Patient ID: Carlos Levine, male   DOB: [birth year], 78 y.o.   MRN: [id number]  Chief Complaint  Patient presents with  . Tachycardia    Referred By Dr. Gloriajean Dell Reason for Referral Shortness of breath.  HPI Location, Quality, Duration, Severity, Timing, Context, Modifying Factors, Associated Signs and Symptoms.  Carlos Levine is a 78 y.o. male.  I was asked to see Carlos Levine by Dr. Marisa Sprinkles regarding his admission for shortness of breath.  I had previously seen [name] in the office and he is currently being evaluated for his lung cancer.  He came into the hospital today for his preadmission testing.  He is scheduled to undergo an endobronchial ultrasound with mediastinal lymph node biopsy.  During that evaluation he was found to have a heart rate in the 150-160 range and he was taken to the emergency room where his SVT was converted.  He currently states that he does not feel short of breath at this time.  He states that he did not feel short of breath when he was in his SVT.  He has seen Dr. Donella Stade and radiation therapy who has recommended SBRT for his lung cancer.  Because of the patient's age and comorbid conditions I have agreed with that treatment plan.   Past Medical History:  Diagnosis Date  . Arthritis   . Cancer [facility])    prostate  . Chronic kidney disease   . Diabetes mellitus without complication ([place])   . History of kidney stones   . Hyperlipidemia   . Prostate cancer [facility])     Past Surgical History:  Procedure Laterality Date  . AMPUTATION FINGER / THUMB Right   . PROSTATE CRYOABLATION    . SPINAL FUSION      Family History  Problem Relation Age of Onset  . Lung cancer Father   . Pancreatic cancer Father   . Brain cancer Mother   . Stomach cancer Paternal Aunt   . Stomach cancer Paternal Uncle   . Cancer Maternal Grandmother   . Kidney cancer Maternal Grandfather     Social History Social History   Tobacco Use  . Smoking status: Former Smoker    Years: 30.00    Types: Cigarettes    Quit date: 10/20/2009    Years since quitting: 9.6  . Smokeless tobacco: Never Used  Substance Use Topics  . Alcohol use: No  . Drug use: No    Allergies  Allergen Reactions  . Ace Inhibitors Other (See Comments)    Unknown reaction type    Current Facility-Administered Medications  Medication Dose Route Frequency Provider Last Rate Last Dose  . insulin aspart (novoLOG) injection 0-9 Units  0-9 Units Subcutaneous TID WC Bettey Costa, MD       Current Outpatient Medications  Medication Sig Dispense Refill  . bicalutamide (CASODEX) 50 MG tablet Take 50 mg by mouth daily at 12 noon.    . pravastatin (PRAVACHOL) 80 MG tablet Take 80 mg by mouth at bedtime.         Review of Systems A complete review of systems was asked and was negative except for the following positive findings shortness of breath at baseline.  Blood pressure (!) 148/61, pulse (!) 49, temperature 97.9 F (36.6 C), temperature source Oral, resp. rate 16, height 5\' 10"  (1.778 m), weight 101.2 kg, SpO2 100 %.  Physical Exam CONSTITUTIONAL:  Pleasant, well-developed, well-nourished, and in no acute distress. EYES: Pupils equal and reactive to light, Sclera  non-icteric EARS, NOSE, MOUTH AND THROAT:  The oropharynx was clear..  Oral mucosa pink and moist. LYMPH NODES:  Lymph nodes in the neck and axillae were normal RESPIRATORY:  Lungs were clear.  Normal respiratory effort without pathologic use of accessory muscles of respiration CARDIOVASCULAR: Heart was regular without murmurs.  There were no carotid bruits. GI: The abdomen was soft, nontender, and nondistended. There were no palpable masses. There was no hepatosplenomegaly. There were normal bowel sounds in all quadrants. GU:  Rectal deferred.   MUSCULOSKELETAL:  Normal muscle strength and tone.  No clubbing or cyanosis.   SKIN:  There were no pathologic skin lesions.  There were no nodules on palpation. NEUROLOGIC:   Sensation is normal.  Cranial nerves are grossly intact. PSYCH:  Oriented to person, place and time.  Mood and affect are normal.  Data Reviewed CT scan  I have personally reviewed the patient's imaging, laboratory findings and medical records.    Assessment    I have independently reviewed the patient's CT scan.  The right lung mass is essentially unchanged.  There is no other acute findings on it.    Plan    I agree with the current treatment plan as outlined.  Admission with control of his heart rate seems in order.  I also agree with the current plans for mediastinal staging and nonoperative management of his lung cancer.  At the present time there is no need for acute surgical intervention.     Carlos Lewandowsky, MD 05/26/2019, 2:39 PM   Patient ID: Carlos Levine, male   DOB: 1940-10-21, 78 y.o.   MRN: [id number]

## 2019-05-26 NOTE — ED Notes (Signed)
Mid way of giving diltiazem(7.5mg ) pt HR dropped to the 50's and b/p 87/66. With held the remainder of dose and EDP notified,.

## 2019-05-26 NOTE — ED Provider Notes (Addendum)
[facility] Emergency Department Provider Note  ____________________________________________  Time seen: Approximately 12:57 PM  I have reviewed the triage vital signs and the nursing notes.   HISTORY  Chief Complaint Tachycardia    HPI Carlos Levine is a 78 y.o. male with a history of CKD diabetes hypertension prostate cancer who comes to the ED today due to tachycardia.  He was having a preop evaluation for an upcoming bronchoscopy when he was noticed that he had a heart rate of about 160 and he was transferred to ED.  Patient reports that he has had shortness of breath for the past 6 months which resulted in a diagnosis of lung cancer that is being worked up for.  He denies any previous issues with racing heart rate.  Has felt more short of breath than usual over the past 24 hours.  Symptoms are constant, worse with exertion, no alleviating factors, no radiating pain.      Past Medical History:  Diagnosis Date  . Arthritis   . Cancer [facility])    prostate  . Chronic kidney disease   . Diabetes mellitus without complication ([place])   . History of kidney stones   . Hyperlipidemia   . Prostate cancer [facility])      Patient Active Problem List   Diagnosis Date Noted  . 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 Localized enlarged lymph nodes 03/29/2019  . Solitary pulmonary nodule 03/29/2019  . Goals of care, counseling/discussion 03/29/2019  . Chronic bilateral low back pain without sciatica 02/21/2018  . Chronic constipation 11/19/2017  . Hyperkalemia 11/19/2017  . Elevated troponin 06/11/2017  . Dizziness 06/24/2016  . 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 Sore of lower lip 05/13/2015  . Benign essential hypertension 02/02/2015  . Type 2 diabetes mellitus with diabetic chronic kidney disease ([place]) 02/02/2015  . Pure hypercholesterolemia 02/02/2015  . Lumbar degenerative disc disease 02/02/2015  . CKD (chronic kidney  disease) stage 3, GFR 30-59 ml/min (HCC) 05/12/2014  . Malignant neoplasm of prostate ([place]) 07/14/2012  . Hypertrophy of breast 07/14/2012  . ED (erectile dysfunction) of organic origin 07/14/2012     Past Surgical History:  Procedure Laterality Date  . AMPUTATION FINGER / THUMB Right   . PROSTATE CRYOABLATION    . SPINAL FUSION       Prior to Admission medications   Medication Sig Start Date End Date Taking? Authorizing Provider  bicalutamide (CASODEX) 50 MG tablet Take 50 mg by mouth daily at 12 noon. 05/09/19  Yes [provider]  pravastatin (PRAVACHOL) 80 MG tablet Take 80 mg by mouth at bedtime.  01/02/19  Yes [provider]     Allergies Ace inhibitors   Family History  Problem Relation Age of Onset  . Lung cancer Father   . Pancreatic cancer Father   . Brain cancer Mother   . Stomach cancer Paternal Aunt   . Stomach cancer Paternal Uncle   . Cancer Maternal Grandmother   . Kidney cancer Maternal Grandfather     Social History Social History   Tobacco Use  . Smoking status: Former Smoker    Years: 30.00    Types: Cigarettes    Quit date: 10/20/2009    Years since quitting: 9.6  . Smokeless tobacco: Never Used  Substance Use Topics  . Alcohol use: No  . Drug use: No    Review of Systems  Constitutional:   No fever or chills.  ENT:   No  sore throat. No rhinorrhea. Cardiovascular:   No chest pain or syncope. Respiratory: Positive shortness of breath without cough. Gastrointestinal:   Negative for abdominal pain, vomiting and diarrhea.  Musculoskeletal:   Negative for focal pain or swelling All other systems reviewed and are negative except as documented above in ROS and HPI.  ____________________________________________   PHYSICAL EXAM:  VITAL SIGNS: ED Triage Vitals  Enc Vitals Group     BP 05/26/19 0824 (!) 129/94     Pulse Rate 05/26/19 0824 (!) 161     Resp 05/26/19 0824 18     Temp 05/26/19 0824 97.9 F (36.6 C)      Temp Source 05/26/19 0824 Oral     SpO2 05/26/19 0824 97 %     Weight 05/26/19 0825 223 lb (101.2 kg)     Height 05/26/19 0825 5\' 10"  (1.778 m)     Head Circumference --      Peak Flow --      Pain Score 05/26/19 0825 0     Pain Loc --      Pain Edu? --      Excl. in [place]? --     Vital signs reviewed, nursing assessments reviewed.   Constitutional:   Alert and oriented.  Ill-appearing. Eyes:   Conjunctivae are normal. EOMI. PERRL. ENT      Head:   Normocephalic and atraumatic.      Nose:   No congestion/rhinnorhea.       Mouth/Throat:   MMM, no pharyngeal erythema. No peritonsillar mass.       Neck:   No meningismus. Full ROM. Hematological/Lymphatic/Immunilogical:   No cervical lymphadenopathy. Cardiovascular:   Tachycardia heart rate 160. Symmetric bilateral radial and DP pulses.  No murmurs. Cap refill less than 2 seconds. Respiratory:   Mild tachypnea.  Normal work of breathing.  Diminished breath sounds on the right compared to left.  No focal crackles or wheezing.   Gastrointestinal:   Soft and nontender. Non distended. There is no CVA tenderness.  No rebound, rigidity, or guarding.  Musculoskeletal:   Normal range of motion in all extremities. No joint effusions.  No lower extremity tenderness.  No edema. Neurologic:   Normal speech and language.  Motor grossly intact. No acute focal neurologic deficits are appreciated.  Skin:    Skin is warm, dry and intact. No rash noted.  No petechiae, purpura, or bullae.  ____________________________________________    LABS (pertinent positives/negatives) (all labs ordered are listed, but only abnormal results are displayed) Labs Reviewed  COMPREHENSIVE METABOLIC PANEL - Abnormal; Notable for the following components:      Result Value   CO2 19 (*)    Glucose, Bld 279 (*)    BUN 28 (*)    Creatinine, Ser 1.87 (*)    GFR calc non Af Amer 33 (*)    GFR calc Af Amer 39 (*)    All other components within normal limits  CBC WITH  DIFFERENTIAL/PLATELET - Abnormal; Notable for the following components:   WBC 12.9 (*)    Neutro Abs 8.3 (*)    Monocytes Absolute 1.3 (*)    All other components within normal limits  ACETAMINOPHEN LEVEL - Abnormal; Notable for the following components:   Acetaminophen (Tylenol), Serum <10 (*)    All other components within normal limits  TROPONIN I (HIGH SENSITIVITY) - Abnormal; Notable for the following components:   Troponin I (High Sensitivity) 18 (*)    All other components within normal limits  TROPONIN I (HIGH SENSITIVITY) - Abnormal; Notable for the following components:   Troponin I (High Sensitivity) 23 (*)    All other components within normal limits  SARS CORONAVIRUS 2 (HOSPITAL ORDER, [facility])  LIPASE, BLOOD  CK  MAGNESIUM  PHOSPHORUS  ETHANOL  SALICYLATE LEVEL   ____________________________________________   EKG  Interpreted by me Initial EKG 8:24 AM Supraventricular tachycardia, rate of 160.  Leftward axis.  Normal QRS.  ST depression lead likely rate related demand ischemia.  Unremarkable T waves.  Repeat EKG performed at 8:57 AM, interpreted by me Sinus rhythm rate of 74, left axis, first-degree AV block with a PR interval of 237 ms.  Normal QRS ST segments and T waves.  No ischemic changes.  Repeat EKG performed at 9:00 AM, interpreted by me Sinus rhythm rate of 75, left axis, no acute interval change.  Nonischemic.  ____________________________________________    RADIOLOGY  Ct Angio Chest Pe W And/or Wo Contrast  Result Date: 05/26/2019 CLINICAL DATA:  Short of breath tachycardia. History of prostate cancer. Lung mass. EXAM: CT ANGIOGRAPHY CHEST WITH CONTRAST TECHNIQUE: Multidetector CT imaging of the chest was performed using the standard protocol during bolus administration of intravenous contrast. Multiplanar CT image reconstructions and MIPs were obtained to evaluate the vascular anatomy. CONTRAST:  65mL OMNIPAQUE  IOHEXOL 350 MG/ML SOLN COMPARISON:  CT chest 05/24/2019 FINDINGS: Cardiovascular: Negative for pulmonary embolism. Pulmonary arteries normal in caliber with normal enhancement Atherosclerotic aorta without aneurysm. Extensive coronary calcification. Heart size normal without pericardial effusion. Mediastinum/Nodes: Negative for mass or adenopathy. Lungs/Pleura: Spiculated mass right posterior apical segment measuring approximately 4.2 by 3.7 cm. Small area of central cavitation. Lung mass abuts the pleural surface and is similar in size to the recent study. No pleural effusion. No other lung nodules. Negative for pneumonia. Apically emphysema bilaterally. Upper Abdomen: Gallstones. Musculoskeletal: No acute skeletal abnormality. Review of the MIP images confirms the above findings. IMPRESSION: 1. Negative for pulmonary embolism 2. Atherosclerotic aorta.  Extensive coronary calcification 3. Spiculated mass right posterior apical segment unchanged. Findings compatible with carcinoma of the lung. 4. Cholelithiasis. Electronically Signed   By: Franchot Gallo M.D.   On: 05/26/2019 10:35    ____________________________________________   PROCEDURES .Critical Care Performed by: Carrie Mew, MD Authorized by: Carrie Mew, MD   Critical care provider statement:    Critical care time (minutes):  35   Critical care time was exclusive of:  Separately billable procedures and treating other patients   Critical care was necessary to treat or prevent imminent or life-threatening deterioration of the following conditions:  Cardiac failure   Critical care was time spent personally by me on the following activities:  Development of treatment plan with patient or surrogate, discussions with consultants, evaluation of patient's response to treatment, examination of patient, obtaining history from patient or surrogate, ordering and performing treatments and interventions, ordering and review of laboratory  studies, ordering and review of radiographic studies, pulse oximetry, re-evaluation of patient's condition and review of old charts    ____________________________________________  DIFFERENTIAL DIAGNOSIS   Pulmonary embolism, non-STEMI, SVT, pneumonia, malignant effusion, electrolyte abnormality  CLINICAL IMPRESSION / ASSESSMENT AND PLAN / ED COURSE  Medications ordered in the ED: Medications  sodium chloride 0.9 % bolus 1,000 mL (1,000 mLs Intravenous New Bag/Given 05/26/19 0914)  diltiazem (CARDIZEM) injection 15 mg (7.5 mg Intravenous Given 05/26/19 0908)  iohexol (OMNIPAQUE) 350 MG/ML injection 75 mL (60 mLs Intravenous Contrast Given 05/26/19 0959)  Pertinent labs & imaging results that were available during my care of the patient were reviewed by me and considered in my medical decision making (see chart for details).  Carlos Levine was evaluated in Emergency Department on 05/26/2019 for the symptoms described in the history of present illness. He was evaluated in the context of the global COVID-19 pandemic, which necessitated consideration that the patient might be at risk for infection with the SARS-CoV-2 virus that causes COVID-19. Institutional protocols and algorithms that pertain to the evaluation of patients at risk for COVID-19 are in a state of rapid change based on information released by regulatory bodies including the CDC and federal and state organizations. These policies and algorithms were followed during the patient's care in the ED.     Clinical Course as of May 25 1256  Thu May 26, 2019  0846 Patient presents with acute on chronic shortness of breath, worse in the last 24 hours on top of about 6 months worth of symptoms.  Recently found to have lung cancer in the right lung.  Initial assessment reveals SVT with a heart rate of 165.  He is not in distress, but requires emergent attempted cardioversion with adenosine.  Blood pressures okay.  Once rhythm is stabilized, he  will need a CT angiogram to evaluate for PE.  Will check labs, plan for hospitalization.   [PS]  [phone] Patient able to convert back to sinus rhythm with Valsalva maneuver.  I will give IV diltiazem to guard against recurrence of SVT.  Chart list a contrast allergy, patient reports this is from a vascular stent placement and angiography more than 10 years ago after which he had skin sensitivity with shaving.  Denies hives or blistering or shortness of breath or any other anaphylactic symptoms.  Discussed with radiologist Dr. Jobe Igo who agrees with my sense that this does not represent a true contrast allergy and that the urgency of PE work-up necessitates moving forward without consideration given the low likelihood that the patient actually has an allergic reaction to contrast.   [PS]  1028 Patient had an episode of bradycardia and hypotension that was brief and spontaneously resolved.  He is not septic.  I think this is all complications of his lung cancer and we will follow-up CT report   [PS]    Clinical Course User Index [PS] Carrie Mew, MD    ----------------------------------------- 1:01 PM on 05/26/2019 -----------------------------------------  CT negative for effusion pneumothorax pneumonia or pulmonary embolism.  Continues to have episodes of bradycardia but maintaining blood pressure.  Case discussed with hospitalist Dr. Manuella Ghazi for further work-up and management.   ____________________________________________   FINAL CLINICAL IMPRESSION(S) / ED DIAGNOSES    Final diagnoses:  SVT (supraventricular tachycardia) (HCC)  Bradycardia  Malignant neoplasm of right lung, unspecified part of lung [facility])     ED Discharge Orders    None      Portions of this note were generated with dragon dictation software. Dictation errors may occur despite best attempts at proofreading.   Carrie Mew, MD 05/26/19 [place]    Carrie Mew, MD 06/05/19 1316

## 2019-05-26 NOTE — ED Notes (Signed)
Report received - 2nd troponin sent. Pt comfortable with stable vs

## 2019-05-26 NOTE — ED Notes (Signed)
Pt up to the bedside to use the urinal.

## 2019-05-26 NOTE — ED Notes (Signed)
Pt was here today for preadmit testing for a bronchoscopy for a lung mass , pt was tachycardiac in the 150's and sent to the ED,. Pt denies any chest pain. States he was nervous this morning .

## 2019-05-26 NOTE — ED Triage Notes (Signed)
Patient presents to the ED from pre-admit testing due to an EKG showing SVT.  Patient has cancer and was getting testing prior to a bronchoscopy.  Patient states a mass was found on his lung approx. 1 month ago.  Patient reports shortness of breath x 4 months.  Patient states shortness of breath is worse today.  Patient also states he feels like his heart is beating fast.  Patient appears somewhat pale.

## 2019-05-26 NOTE — Progress Notes (Addendum)
   05/26/19 2000  Clinical Encounter Type  Visited With Patient;Health care provider  Visit Type Initial  Referral From Nurse  Consult/Referral To Chaplain   Chaplain received an OR to support the patient. Upon arrival, the patient was resting in bed with the television and lights on. He indicated that he had an urgent need to use the bathroom; chaplain requested Nurse Tech for the patient. Will return shortly.  Chaplain returned to follow up with the patient. The lights were off and the patient was changing the television channels. The patient was warm, pleasant, and conversational. The patient shared what brought him to the hospital and necessitated his care in the ICU. He was surprised that he couldn't tell that his heart-rate was double the normal range. The patient shared that he has been diagnosed with lung cancer and was preparing for a procedure when this happened. The patient expressed his love and concern for his wife and shared that she is concerned for him too. The patient shared some of his familial and personal histories before receiving a phone call from his son. Chaplain provided support in the form of active and reflective listening and compassionate presence. This chaplain stepped out to allow the patient time to talk with his son and receive care from his bedside nurse and will request follow-up from Unit chaplain tomorrow.

## 2019-05-26 NOTE — Progress Notes (Signed)
Updated patient's wife via phone.

## 2019-05-26 NOTE — H&P (Addendum)
[place] [facility] [place] NAME: Carlos Levine    MR#:  [id number]  DATE OF BIRTH:  1941-10-19  DATE OF ADMISSION:  05/26/2019  PRIMARY CARE PHYSICIAN: [name], MD   REQUESTING/REFERRING PHYSICIAN:  Dr Alm Bustard  CHIEF COMPLAINT:   SOB HISTORY OF PRESENT ILLNESS:  Carlos Levine  is a 78 y.o. male with a known history of prostate cancer and recent diagnosis of lung cancer currently receiving treatment who presented to the ER with shortness of breath and found to have SVT. Cardioverted with adenosine/ Heart rates is low in the 30-50s. He is more comfortable denies lightheadness/dizziness.   PAST MEDICAL HISTORY:   Past Medical History:  Diagnosis Date  . Arthritis   . Cancer [facility])    prostate  . Chronic kidney disease   . Diabetes mellitus without complication ([place])   . History of kidney stones   . Hyperlipidemia   . Prostate cancer ([place])     PAST SURGICAL HISTORY:   Past Surgical History:  Procedure Laterality Date  . AMPUTATION FINGER / THUMB Right   . PROSTATE CRYOABLATION    . SPINAL FUSION      SOCIAL HISTORY:   Social History   Tobacco Use  . Smoking status: Former Smoker    Years: 30.00    Types: Cigarettes    Quit date: 10/20/2009    Years since quitting: 9.6  . Smokeless tobacco: Never Used  Substance Use Topics  . Alcohol use: No    FAMILY HISTORY:   Family History  Problem Relation Age of Onset  . Lung cancer Father   . Pancreatic cancer Father   . Brain cancer Mother   . Stomach cancer Paternal Aunt   . Stomach cancer Paternal Uncle   . Cancer Maternal Grandmother   . Kidney cancer Maternal Grandfather     DRUG ALLERGIES:   Allergies  Allergen Reactions  . Ace Inhibitors Other (See Comments)    Unknown reaction type    REVIEW OF SYSTEMS:   Review of Systems  Constitutional: Positive for malaise/fatigue. Negative for chills and fever.  HENT: Negative.  Negative for ear discharge, ear  pain, hearing loss, nosebleeds and sore throat.   Eyes: Negative.  Negative for blurred vision and pain.  Respiratory: Positive for shortness of breath. Negative for cough, hemoptysis and wheezing.   Cardiovascular: Positive for palpitations. Negative for chest pain and leg swelling.  Gastrointestinal: Negative.  Negative for abdominal pain, blood in stool, diarrhea, nausea and vomiting.  Genitourinary: Negative.  Negative for dysuria.  Musculoskeletal: Negative.  Negative for back pain.  Skin: Negative.   Neurological: Negative for dizziness, tremors, speech change, focal weakness, seizures and headaches.  Endo/Heme/Allergies: Negative.  Does not bruise/bleed easily.  Psychiatric/Behavioral: Negative.  Negative for depression, hallucinations and suicidal ideas.    MEDICATIONS AT HOME:   Prior to Admission medications   Medication Sig Start Date End Date Taking? Authorizing Provider  bicalutamide (CASODEX) 50 MG tablet Take 50 mg by mouth daily at 12 noon. 05/09/19  Yes [provider]  pravastatin (PRAVACHOL) 80 MG tablet Take 80 mg by mouth at bedtime.  01/02/19  Yes [provider]      VITAL SIGNS:  Blood pressure 137/75, pulse (!) 49, temperature 97.9 F (36.6 C), temperature source Oral, resp. rate 12, height 5\' 10"  (1.778 m), weight 101.2 kg, SpO2 98 %.  PHYSICAL EXAMINATION:   Physical Exam Constitutional:      General:  He is not in acute distress. HENT:     Head: Normocephalic.  Eyes:     General: No scleral icterus. Neck:     Musculoskeletal: Normal range of motion and neck supple.     Vascular: No JVD.     Trachea: No tracheal deviation.  Cardiovascular:     Rate and Rhythm: Normal rate and regular rhythm.     Heart sounds: Normal heart sounds. No murmur. No friction rub. No gallop.   Pulmonary:     Effort: Pulmonary effort is normal. No respiratory distress.     Breath sounds: Normal breath sounds. No wheezing or rales.  Chest:     Chest wall:  No tenderness.  Abdominal:     General: Bowel sounds are normal. There is no distension.     Palpations: Abdomen is soft. There is no mass.     Tenderness: There is no abdominal tenderness. There is no guarding or rebound.  Musculoskeletal: Normal range of motion.  Skin:    General: Skin is warm.     Findings: No erythema or rash.  Neurological:     Mental Status: He is alert and oriented to person, place, and time.  Psychiatric:        Judgment: Judgment normal.       LABORATORY PANEL:   CBC Recent Labs  Lab 05/26/19 0847  WBC 12.9*  HGB 14.5  HCT 44.5  PLT 228   ------------------------------------------------------------------------------------------------------------------  Chemistries  Recent Labs  Lab 05/26/19 0847  NA 138  K 4.6  CL 108  CO2 19*  GLUCOSE 279*  BUN 28*  CREATININE 1.87*  CALCIUM 9.0  MG 2.0  AST 17  ALT 10  ALKPHOS 48  BILITOT 0.9   ------------------------------------------------------------------------------------------------------------------  Cardiac Enzymes No results for input(s): TROPONINI in the last 168 hours. ------------------------------------------------------------------------------------------------------------------  RADIOLOGY:  Ct Angio Chest Pe W And/or Wo Contrast  Result Date: 05/26/2019 CLINICAL DATA:  Short of breath tachycardia. History of prostate cancer. Lung mass. EXAM: CT ANGIOGRAPHY CHEST WITH CONTRAST TECHNIQUE: Multidetector CT imaging of the chest was performed using the standard protocol during bolus administration of intravenous contrast. Multiplanar CT image reconstructions and MIPs were obtained to evaluate the vascular anatomy. CONTRAST:  75mL OMNIPAQUE IOHEXOL 350 MG/ML SOLN COMPARISON:  CT chest 05/24/2019 FINDINGS: Cardiovascular: Negative for pulmonary embolism. Pulmonary arteries normal in caliber with normal enhancement Atherosclerotic aorta without aneurysm. Extensive coronary calcification.  Heart size normal without pericardial effusion. Mediastinum/Nodes: Negative for mass or adenopathy. Lungs/Pleura: Spiculated mass right posterior apical segment measuring approximately 4.2 by 3.7 cm. Small area of central cavitation. Lung mass abuts the pleural surface and is similar in size to the recent study. No pleural effusion. No other lung nodules. Negative for pneumonia. Apically emphysema bilaterally. Upper Abdomen: Gallstones. Musculoskeletal: No acute skeletal abnormality. Review of the MIP images confirms the above findings. IMPRESSION: 1. Negative for pulmonary embolism 2. Atherosclerotic aorta.  Extensive coronary calcification 3. Spiculated mass right posterior apical segment unchanged. Findings compatible with carcinoma of the lung. 4. Cholelithiasis. Electronically Signed   By: Franchot Gallo M.D.   On: 05/26/2019 10:35    EKG:  Initial showed SVT HR 16  now NSR no ST elevation/depression  IMPRESSION AND PLAN:   78 year old male with history of recent diagnosis of lung cancer seen by Dr. Donella Stade who presented to ER due to shortness of breath and found to have SVT status post adenosine.  1.  SVT: Patient currently in bradycardia. STEP DOWN ADMIT  D/w  dr simonds  Cardiology consult via Epic ECHO TSH  2.  Lung cancer: Patient will be seen by Dr. Donella Stade and his pulmonologist Consult placed via Epic CT negative for PE 3.  Chronic kidney disease stage III at baseline  4.  Diet-controlled diabetes on sliding scale  5.  History of prostate cancer continue with Casodex  6.  Hyperlipidemia: Continue Pravachol All the records are reviewed and case discussed with ED provider. Management plans discussed with the patient and he is  in agreement  CODE STATUS: FULL  TOTAL TIME TAKING CARE OF THIS PATIENT: 45 minutes.    Bettey Costa M.D on 05/26/2019 at 1:17 PM  Between 7am to 6pm - Pager - [phone]  After 6pm go to [email or web address] - password [email or web address] [place]  [facility]  Office  [phone]  CC: Primary care physician; [name], MD

## 2019-05-26 NOTE — Consult Note (Signed)
Name: Carlos Levine MRN: [id number] DOB: December 27, 1940     CONSULTATION DATE: 05/26/2019  REFERRING MD :  Dr. Joni Fears - ED  CHIEF COMPLAINT:  Shortness of breath.   HISTORY OF PRESENT ILLNESS:  78 year old male with history of arthritis, CKD (stg III), squamous cell carcinoma of lung, DM, HLD, prostate cancer (treated with Casodex, s/p cryotherapy), and history of nephrolithiasis being managed today in the ICU for bradycardia and hypotension s/p conversion of SVT to NSR. Today he was getting his undergoing his pre-operative evaluation before his upcoming bronchoscopy, and was found to have tachycardia (160 bpm) with evidence of SVT on EKG. He was told to go to the ED for further evaluation. His initial EKG in the ED indicated SVT, with HR of 165. Valsalva maneuver completed and was successful at converting rate. Diltiazem started to maintain patient out of SVT. He later had a brief episode of bradycardia (50's) and hypotension (87/66), which resolved with cessation of diltiazem. He will be requiring ICU management.   He is doing well with no concrete complaints. He does note that he was more short of breath this AM than his baseline (SOB secondary to lung cancer) when he arrived to his pre-operative evaluation. Currently he is not experiencing worsening shortness of breath. Briefly, he did note that when he arrived for his appointment this AM, he was feeling anxious about his current health condition. Denies chest pain, palpitations, dizziness, edema.   SIGNIFICANT EVENTS: - 05/26/2019: Pre-op eval 8AM -- tachycardia in 160's. Transferred to ED for evaluation. EKG in ED c/w SVT. Attempts to control rate with valsalva and Diltiazem successful. Became bradycardic and hypotensive. Diltiazem stopped. Pt brought to ICU.   PAST MEDICAL HISTORY :   has a past medical history of Arthritis, Cancer ([place]), Chronic kidney disease, Diabetes mellitus without complication ([place]), History of kidney stones,  Hyperlipidemia, and Prostate cancer ([place]).  has a past surgical history that includes Spinal fusion; Prostate cryoablation; and Amputation finger / thumb (Right). Prior to Admission medications   Medication Sig Start Date End Date Taking? Authorizing Provider  bicalutamide (CASODEX) 50 MG tablet Take 50 mg by mouth daily at 12 noon. 05/09/19  Yes [provider]  pravastatin (PRAVACHOL) 80 MG tablet Take 80 mg by mouth at bedtime.  01/02/19  Yes [provider]   Allergies  Allergen Reactions  . Ace Inhibitors Other (See Comments)    Unknown reaction type   FAMILY HISTORY:  family history includes Brain cancer in his mother; Cancer in his maternal grandmother; Kidney cancer in his maternal grandfather; Lung cancer in his father; Pancreatic cancer in his father; Stomach cancer in his paternal aunt and paternal uncle. SOCIAL HISTORY:  reports that he quit smoking about 9 years ago. His smoking use included cigarettes. He quit after 30.00 years of use. He has never used smokeless tobacco. He reports that he does not drink alcohol or use drugs.   Review of Systems:   Subjective: "I am in no pain."  Gen:  Denies  fever, sweats, chills  Cardiac:  No dizziness, chest pain or heaviness, palpitations, chest tightness, edema Resp:  Shortness of breath, and cough at baseline, unchanged. Gi: Right costal margin pressure, sudden onset and cessation, for the past couple of weeks.  Not present currently. Denies stomach pain, nausea or vomiting, diarrhea, constipation, bowel incontinence. 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 Denies weakness.  Other:  All other systems negative  VITAL SIGNS: Temp:  [  97.9 F (36.6 C)-98 F (36.7 C)] 97.9 F (36.6 C) (08/06 0824) Pulse Rate:  [40-161] 45 (08/06 1343) Resp:  [12-25] 15 (08/06 1343) BP: (87-155)/(63-94) 140/65 (08/06 1343) SpO2:  [97 %-100 %] 100 % (08/06 1343) Weight:  [101.2 kg-101.3 kg] 101.2  kg (08/06 0825)  No intake/output data recorded. No intake/output data recorded.  SpO2: 100 %  Physical Examination:  GENERAL: Sitting comfortably in bed. No acute distress. On room air.  HEAD: Normocephalic, atraumatic.  EYES: Pupils equal, round, reactive to light.  No scleral icterus.  MOUTH: Moist mucosal membrane. PULMONARY: Clear to auscultation bilaterally.  CARDIOVASCULAR: Distant S1 and S2. Regular rate and rhythm. No murmurs, rubs, or gallops. Gynecomastia.  GASTROINTESTINAL: RU and RLQ tenderness to palpation, with radiation of pain to right flank, with voluntary guarding.  Extremities:   Upper extremities: Radial pulses 2+ bilaterally.  Lower extremities: DP pulses 2+ R, 1+/diminished on L. NEUROLOGIC: Alert and oriented x 3. SKIN: intact, warm, dry.   I personally reviewed lab work that was obtained in last 24 hrs.  MEDICATIONS: I have reviewed all medications and confirmed regimen as documented  CULTURE RESULTS   Recent Results (from the past 240 hour(s))  SARS Coronavirus 2 [facility] order, Performed in [facility] hospital lab) Nasopharyngeal Nasopharyngeal Swab     Status: None   Collection Time: 05/26/19  9:29 AM   Specimen: Nasopharyngeal Swab  Result Value Ref Range Status   SARS Coronavirus 2 NEGATIVE NEGATIVE Final    Comment: (NOTE) If result is NEGATIVE SARS-CoV-2 target nucleic acids are NOT DETECTED. The SARS-CoV-2 RNA is generally detectable in upper and lower  respiratory specimens during the acute phase of infection. The lowest  concentration of SARS-CoV-2 viral copies this assay can detect is 250  copies / mL. A negative result does not preclude SARS-CoV-2 infection  and should not be used as the sole basis for treatment or other  patient management decisions.  A negative result may occur with  improper specimen collection / handling, submission of specimen other  than nasopharyngeal swab, presence of viral mutation(s) within the  areas  targeted by this assay, and inadequate number of viral copies  (<250 copies / mL). A negative result must be combined with clinical  observations, patient history, and epidemiological information. If result is POSITIVE SARS-CoV-2 target nucleic acids are DETECTED. The SARS-CoV-2 RNA is generally detectable in upper and lower  respiratory specimens dur ing the acute phase of infection.  Positive  results are indicative of active infection with SARS-CoV-2.  Clinical  correlation with patient history and other diagnostic information is  necessary to determine patient infection status.  Positive results do  not rule out bacterial infection or co-infection with other viruses. If result is PRESUMPTIVE POSTIVE SARS-CoV-2 nucleic acids MAY BE PRESENT.   A presumptive positive result was obtained on the submitted specimen  and confirmed on repeat testing.  While 2019 novel coronavirus  (SARS-CoV-2) nucleic acids may be present in the submitted sample  additional confirmatory testing may be necessary for epidemiological  and / or clinical management purposes  to differentiate between  SARS-CoV-2 and other Sarbecovirus currently known to infect humans.  If clinically indicated additional testing with an alternate test  methodology [phone]) is advised. The SARS-CoV-2 RNA is generally  detectable in upper and lower respiratory sp ecimens during the acute  phase of infection. The expected result is Negative. Fact Sheet for Patients:  StrictlyIdeas.no Fact Sheet for Healthcare Providers: BankingDealers.co.za This test is not  yet approved or cleared by the [place] and has been authorized for detection and/or diagnosis of SARS-CoV-2 by FDA under an Emergency Use Authorization (EUA).  This EUA will remain in effect (meaning this test can be used) for the duration of the COVID-19 declaration under Section 564(b)(1) of the Act, 21 U.S.C. section  360bbb-3(b)(1), unless the authorization is terminated or revoked sooner. Performed at [facility], [address] [place], [place] [address]      CBC Latest Ref Rng & Units 05/26/2019 04/13/2019 03/29/2019  WBC 4.0 - 10.5 K/uL 12.9(H) 9.4 8.7  Hemoglobin 13.0 - 17.0 g/dL 14.5 14.5 14.2  Hematocrit 39.0 - 52.0 % 44.5 44.7 43.5  Platelets 150 - 400 K/uL 228 222 227   CMP Latest Ref Rng & Units 05/26/2019 03/29/2019 06/12/2017  Glucose 70 - 99 mg/dL 279(H) 137(H) 52(L)  BUN 8 - 23 mg/dL 28(H) 23 23(H)  Creatinine 0.61 - 1.24 mg/dL 1.87(H) 1.83(H) 1.71(H)  Sodium 135 - 145 mmol/L 138 138 140  Potassium 3.5 - 5.1 mmol/L 4.6 4.6 4.0  Chloride 98 - 111 mmol/L 108 109 109  CO2 22 - 32 mmol/L 19(L) 19(L) 23  Calcium 8.9 - 10.3 mg/dL 9.0 8.8(L) 8.8(L)  Total Protein 6.5 - 8.1 g/dL 8.1 7.5 -  Total Bilirubin 0.3 - 1.2 mg/dL 0.9 1.0 -  Alkaline Phos 38 - 126 U/L 48 42 -  AST 15 - 41 U/L 17 17 -  ALT 0 - 44 U/L 10 12 -   Troponin: 23 at 11:18 AM, (18 at admission)  IMAGING    Ct Angio Chest Pe W And/or Wo Contrast  Result Date: 05/26/2019 CLINICAL DATA:  Short of breath tachycardia. History of prostate cancer. Lung mass. EXAM: CT ANGIOGRAPHY CHEST WITH CONTRAST TECHNIQUE: Multidetector CT imaging of the chest was performed using the standard protocol during bolus administration of intravenous contrast. Multiplanar CT image reconstructions and MIPs were obtained to evaluate the vascular anatomy. CONTRAST:  17mL OMNIPAQUE IOHEXOL 350 MG/ML SOLN COMPARISON:  CT chest 05/24/2019 FINDINGS: Cardiovascular: Negative for pulmonary embolism. Pulmonary arteries normal in caliber with normal enhancement Atherosclerotic aorta without aneurysm. Extensive coronary calcification. Heart size normal without pericardial effusion. Mediastinum/Nodes: Negative for mass or adenopathy. Lungs/Pleura: Spiculated mass right posterior apical segment measuring approximately 4.2 by 3.7 cm. Small area of central  cavitation. Lung mass abuts the pleural surface and is similar in size to the recent study. No pleural effusion. No other lung nodules. Negative for pneumonia. Apically emphysema bilaterally. Upper Abdomen: Gallstones. Musculoskeletal: No acute skeletal abnormality. Review of the MIP images confirms the above findings. IMPRESSION: 1. Negative for pulmonary embolism 2. Atherosclerotic aorta.  Extensive coronary calcification 3. Spiculated mass right posterior apical segment unchanged. Findings compatible with carcinoma of the lung. 4. Cholelithiasis. Electronically Signed   By: Franchot Gallo M.D.   On: 05/26/2019 10:35   ASSESSMENT AND PLAN SYNOPSIS: 78 yo male with history as stated above, seen today for bradycardia and hypotension in the setting of SVT. As mentioned above, patient did receive pharmacologic agents for rate control, however he is not requiring it presently. His heart rate is low/normal rate, and his MAP > 65. He is asymptomatic, at present. Etiology remains unknown, possibly secondary to anxiety requiring state of health.   Supraventricular Tachycardia - Patient blood pressure and HR stable. No current requirement for pharmacologic rate or rhythm control. - ECHO ordered.  - Continue ICU monitoring. - Vasopressors if indicated to maintain MAP > 65. - Likely  will require outpatient cardiology provider.   GI - Will continue to monitor abdominal pain.  - GI PROPHYLAXIS as indicated  NUTRITIONAL STATUS DIET--> Regular Constipation protocol as indicated  ENDO - Continue insulin sliding scale, as indicated. - will use ICU hypoglycemic\Hyperglycemia protocol if needed  ELECTROLYTES -follow labs as needed -replace as needed -pharmacy consultation and following  DVT/GI PRX ordered - Heparin  TRANSFUSIONS AS NEEDED MONITOR FSBS ASSESS the need for LABS    PCCM ATTENDING ATTESTATION: I have evaluated patient with the PA student Sepic, reviewed database in its entirety and  discussed care plan in detail. In addition, this patient was discussed on multidisciplinary rounds. I have personally reviewed all chest radiographs discussed herein including CXRs and CT chest unless otherwise indicated  Recent dx of sq cell lung ca. Planned staging EBUS 08/10. Was undergoing anesthesia eval and found to be in SVT. Sent to ED. Converted with vagal maneuver. Received 15 mg diltiazem, then bradycardic so sent to SDU for "closer monitoring". Further mgmt per Cardiology. Likely transfer out of SDU or perhaps DC home 08/07    Merton Border, MD PCCM service Mobile [phone] Pager [phone] 05/26/2019 10:40 PM

## 2019-05-27 ENCOUNTER — Encounter: Payer: Self-pay | Admitting: Anesthesiology

## 2019-05-27 ENCOUNTER — Inpatient Hospital Stay
Admit: 2019-05-27 | Discharge: 2019-05-27 | Disposition: A | Discharge status: Home / Self Care | Payer: Medicare Other | Attending: Internal Medicine | Admitting: Internal Medicine

## 2019-05-27 DIAGNOSIS — I471 Supraventricular tachycardia: Secondary | ICD-10-CM | POA: Diagnosis not present

## 2019-05-27 DIAGNOSIS — R001 Bradycardia, unspecified: Secondary | ICD-10-CM | POA: Diagnosis not present

## 2019-05-27 DIAGNOSIS — R9431 Abnormal electrocardiogram [ECG] [EKG]: Secondary | ICD-10-CM

## 2019-05-27 LAB — BASIC METABOLIC PANEL
Anion gap: 11 (ref 5–15)
BUN: 23 mg/dL (ref 8–23)
CO2: 20 mmol/L — ABNORMAL LOW (ref 22–32)
Calcium: 8.7 mg/dL — ABNORMAL LOW (ref 8.9–10.3)
Chloride: 110 mmol/L (ref 98–111)
Creatinine, Ser: 1.5 mg/dL — ABNORMAL HIGH (ref 0.61–1.24)
GFR calc Af Amer: 51 mL/min — ABNORMAL LOW (ref >60)
GFR calc non Af Amer: 44 mL/min — ABNORMAL LOW (ref >60)
Glucose, Bld: 152 mg/dL — ABNORMAL HIGH (ref 70–99)
Potassium: 4.1 mmol/L (ref 3.5–5.1)
Sodium: 141 mmol/L (ref 135–145)

## 2019-05-27 LAB — MAGNESIUM: Magnesium: 2 mg/dL (ref 1.7–2.4)

## 2019-05-27 LAB — CBC
HCT: 38.9 % — ABNORMAL LOW (ref 39.0–52.0)
Hemoglobin: 12.5 g/dL — ABNORMAL LOW (ref 13.0–17.0)
MCH: 29.1 pg (ref 26.0–34.0)
MCHC: 32.1 g/dL (ref 30.0–36.0)
MCV: 90.7 fL (ref 80.0–100.0)
Platelets: 203 10*3/uL (ref 150–400)
RBC: 4.29 MIL/uL (ref 4.22–5.81)
RDW: 13 % (ref 11.5–15.5)
WBC: 7.6 10*3/uL (ref 4.0–10.5)
nRBC: 0 % (ref 0.0–0.2)

## 2019-05-27 LAB — GLUCOSE, CAPILLARY: Glucose-Capillary: 149 mg/dL — ABNORMAL HIGH (ref 70–99)

## 2019-05-27 LAB — PHOSPHORUS: Phosphorus: 3.3 mg/dL (ref 2.5–4.6)

## 2019-05-27 MED ORDER — PERFLUTREN LIPID MICROSPHERE
1.0000 mL | INTRAVENOUS | Status: AC | PRN
  Administered 2019-05-27: 3 mL via INTRAVENOUS
  Filled 2019-05-27: qty 10

## 2019-05-27 NOTE — Discharge Summary (Signed)
[place] [facility] [place] NAME: Carlos Levine    MR#:  [id number]  DATE OF BIRTH:  03-04-1941  DATE OF ADMISSION:  05/26/2019 ADMITTING PHYSICIAN: Bettey Costa, MD  DATE OF DISCHARGE: 05/27/2019  PRIMARY CARE PHYSICIAN: [name], MD    ADMISSION DIAGNOSIS:  Bradycardia [R00.1] SVT (supraventricular tachycardia) (HCC) [I47.1] Malignant neoplasm of right lung, unspecified part of lung ([place]) [C34.91]  DISCHARGE DIAGNOSIS:  Active Problems:   SOB (shortness of breath)   SVT (supraventricular tachycardia) ([place])   SECONDARY DIAGNOSIS:   Past Medical History:  Diagnosis Date  . Arthritis   . Cancer [facility])    prostate  . Chronic kidney disease   . Diabetes mellitus without complication ([place])   . History of kidney stones   . Hyperlipidemia   . Prostate cancer [facility])     HOSPITAL COURSE:  78 year old male with history of recent diagnosis of lung cancer seen by Dr. Donella Stade who presented to ER due to shortness of breath and found to have SVT status post adenosine.  1.  SVT: Patient had adenosine and diltiazem drip.  His heart rate was low after the diltiazem was started.  Diltiazem obviously was discontinued.  He was in the stepdown unit overnight.  His heart rate is back to normal.  He is ambulating with heart rates in the 70s to 80s.  Shortness of breath was due to SVT. He had an echocardiogram performed during this visit.  2.    Right lung mass: Patient was evaluated by Dr. Genevive Bi.  The right lung mass is essentially unchanged.  Patient has outpatient follow-up next week for mediastinal staging.   3.  Chronic kidney disease stage III: Creatinine is at baseline  4.  Diet-controlled diabetes on sliding scale  5.  History of prostate cancer continue with Casodex  6.  Hyperlipidemia: Continue Pravachol    DISCHARGE CONDITIONS AND DIET:   Stable for discharge regular/diabetic diet  CONSULTS OBTAINED:  Treatment Team:  [name], MD  DRUG ALLERGIES:   Allergies  Allergen Reactions  . Ace Inhibitors Other (See Comments)    Unknown reaction type    DISCHARGE MEDICATIONS:   Allergies as of 05/27/2019      Reactions   Ace Inhibitors Other (See Comments)   Unknown reaction type      Medication List    TAKE these medications   bicalutamide 50 MG tablet Commonly known as: CASODEX Take 50 mg by mouth daily at 12 noon.   pravastatin 80 MG tablet Commonly known as: PRAVACHOL Take 80 mg by mouth at bedtime.         Today   CHIEF COMPLAINT:  Patient wants to go home reports no shortness of breath, chest pain or palpitations. VITAL SIGNS:  Blood pressure (!) 145/71, pulse (!) 54, temperature 98.1 F (36.7 C), resp. rate 20, height 5\' 10"  (1.778 m), weight 97.4 kg, SpO2 98 %.   REVIEW OF SYSTEMS:  Review of Systems  Constitutional: Negative.  Negative for chills, fever and malaise/fatigue.  HENT: Negative.  Negative for ear discharge, ear pain, hearing loss, nosebleeds and sore throat.   Eyes: Negative.  Negative for blurred vision and pain.  Respiratory: Negative.  Negative for cough, hemoptysis, shortness of breath and wheezing.   Cardiovascular: Negative.  Negative for chest pain, palpitations and leg swelling.  Gastrointestinal: Negative.  Negative for abdominal pain, blood in stool, diarrhea, nausea and vomiting.  Genitourinary: Negative.  Negative for dysuria.  Musculoskeletal: Negative.  Negative for back pain.  Skin: Negative.   Neurological: Negative for dizziness, tremors, speech change, focal weakness, seizures and headaches.  Endo/Heme/Allergies: Negative.  Does not bruise/bleed easily.  Psychiatric/Behavioral: Negative.  Negative for depression, hallucinations and suicidal ideas.     PHYSICAL EXAMINATION:  GENERAL:  78 y.o.-year-old patient lying in the bed with no acute distress.  NECK:  Supple, no jugular venous distention. No thyroid enlargement, no tenderness.  LUNGS:  Normal breath sounds bilaterally, no wheezing, rales,rhonchi  No use of accessory muscles of respiration.  CARDIOVASCULAR: S1, S2 normal. No murmurs, rubs, or gallops.  ABDOMEN: Soft, non-tender, non-distended. Bowel sounds present. No organomegaly or mass.  EXTREMITIES: No pedal edema, cyanosis, or clubbing.  PSYCHIATRIC: The patient is alert and oriented x 3.  SKIN: No obvious rash, lesion, or ulcer.   DATA REVIEW:   CBC Recent Labs  Lab 05/27/19 0437  WBC 7.6  HGB 12.5*  HCT 38.9*  PLT 203    Chemistries  Recent Labs  Lab 05/26/19 0847 05/27/19 0437  NA 138 141  K 4.6 4.1  CL 108 110  CO2 19* 20*  GLUCOSE 279* 152*  BUN 28* 23  CREATININE 1.87* 1.50*  CALCIUM 9.0 8.7*  MG 2.0 2.0  AST 17  --   ALT 10  --   ALKPHOS 48  --   BILITOT 0.9  --     Cardiac Enzymes No results for input(s): TROPONINI in the last 168 hours.  Microbiology Results  @[email or web address] @  RADIOLOGY:  Ct Angio Chest Pe W And/or Wo Contrast  Result Date: 05/26/2019 CLINICAL DATA:  Short of breath tachycardia. History of prostate cancer. Lung mass. EXAM: CT ANGIOGRAPHY CHEST WITH CONTRAST TECHNIQUE: Multidetector CT imaging of the chest was performed using the standard protocol during bolus administration of intravenous contrast. Multiplanar CT image reconstructions and MIPs were obtained to evaluate the vascular anatomy. CONTRAST:  57mL OMNIPAQUE IOHEXOL 350 MG/ML SOLN COMPARISON:  CT chest 05/24/2019 FINDINGS: Cardiovascular: Negative for pulmonary embolism. Pulmonary arteries normal in caliber with normal enhancement Atherosclerotic aorta without aneurysm. Extensive coronary calcification. Heart size normal without pericardial effusion. Mediastinum/Nodes: Negative for mass or adenopathy. Lungs/Pleura: Spiculated mass right posterior apical segment measuring approximately 4.2 by 3.7 cm. Small area of central cavitation. Lung mass abuts the pleural surface and is similar in size to the recent study. No  pleural effusion. No other lung nodules. Negative for pneumonia. Apically emphysema bilaterally. Upper Abdomen: Gallstones. Musculoskeletal: No acute skeletal abnormality. Review of the MIP images confirms the above findings. IMPRESSION: 1. Negative for pulmonary embolism 2. Atherosclerotic aorta.  Extensive coronary calcification 3. Spiculated mass right posterior apical segment unchanged. Findings compatible with carcinoma of the lung. 4. Cholelithiasis. Electronically Signed   By: Franchot Gallo M.D.   On: 05/26/2019 10:35      Allergies as of 05/27/2019      Reactions   Ace Inhibitors Other (See Comments)   Unknown reaction type      Medication List    TAKE these medications   bicalutamide 50 MG tablet Commonly known as: CASODEX Take 50 mg by mouth daily at 12 noon.   pravastatin 80 MG tablet Commonly known as: PRAVACHOL Take 80 mg by mouth at bedtime.           Management plans discussed with the patient and he is in agreement. Stable for discharge   Patient should follow up with pcp  CODE STATUS:     Code Status Orders  (From  admission, onward)         Start     Ordered   05/26/19 1509  Full code  Continuous     05/26/19 1508        Code Status History    Date Active Date Inactive Code Status Order ID Comments User Context   06/11/2017 1937 06/13/2017 1630 Full Code [id number]  Baxter Hire, MD ED   Advance Care Planning Activity      TOTAL TIME TAKING CARE OF THIS PATIENT: 38 minutes.    Note: This dictation was prepared with Dragon dictation along with smaller phrase technology. Any transcriptional errors that result from this process are unintentional.  Bettey Costa M.D on 05/27/2019 at 9:37 AM  Between 7am to 6pm - Pager - [phone] After 6pm go to [email or web address] - password [email or web address] [facility]  Office  [phone]  CC: Primary care physician; [name], MD

## 2019-05-27 NOTE — Progress Notes (Signed)
*  PRELIMINARY RESULTS* Echocardiogram 2D Echocardiogram has been performed.  Carlos Levine 05/27/2019, 9:18 AM

## 2019-05-27 NOTE — Consult Note (Signed)
Reason for Consult: SVT preop for lung biopsy Referring Physician: Dr. Benjie Karvonen hospitalist Dr. Kerrin Mo primary  Carlos Levine is an 78 y.o. male.  HPI: Patient presents with history of prostate cancer now found to have a lung mass suggestive of lung cancer is preop for biopsy patient had dyspnea shortness of breath was found to have had SVT.  Patient was converted chemically with adenosine patient is more comfortable denies lightheadedness dizziness state intensive care unit overnight and had no further episodes of arrhythmia patient is now ready to be discharged denied any chest pain  Past Medical History:  Diagnosis Date  . Arthritis   . Cancer [facility])    prostate  . Chronic kidney disease   . Diabetes mellitus without complication ([place])   . History of kidney stones   . Hyperlipidemia   . Prostate cancer [facility])     Past Surgical History:  Procedure Laterality Date  . AMPUTATION FINGER / THUMB Right   . PROSTATE CRYOABLATION    . SPINAL FUSION      Family History  Problem Relation Age of Onset  . Lung cancer Father   . Pancreatic cancer Father   . Brain cancer Mother   . Stomach cancer Paternal Aunt   . Stomach cancer Paternal Uncle   . Cancer Maternal Grandmother   . Kidney cancer Maternal Grandfather     Social History:  reports that he quit smoking about 9 years ago. His smoking use included cigarettes. He quit after 30.00 years of use. He has never used smokeless tobacco. He reports that he does not drink alcohol or use drugs.  Allergies:  Allergies  Allergen Reactions  . Ace Inhibitors Other (See Comments)    Unknown reaction type    Medications: I have reviewed the patient's current medications.  Results for orders placed or performed during the hospital encounter of 05/26/19 (from the past 48 hour(s))  Hemoglobin A1c     Status: Abnormal   Collection Time: 05/26/19  8:30 AM  Result Value Ref Range   Hgb A1c MFr Bld 7.4 (H) 4.8 - 5.6 %    Comment: (NOTE) Pre  diabetes:          5.7%-6.4% Diabetes:              >6.4% Glycemic control for   <7.0% adults with diabetes    Mean Plasma Glucose 165.68 mg/dL    Comment: Performed at [facility], [place] [address], [place], [place] [address]  Comprehensive metabolic panel     Status: Abnormal   Collection Time: 05/26/19  8:47 AM  Result Value Ref Range   Sodium 138 135 - 145 mmol/L   Potassium 4.6 3.5 - 5.1 mmol/L   Chloride 108 98 - 111 mmol/L   CO2 19 (L) 22 - 32 mmol/L   Glucose, Bld 279 (H) 70 - 99 mg/dL   BUN 28 (H) 8 - 23 mg/dL   Creatinine, Ser 1.87 (H) 0.61 - 1.24 mg/dL   Calcium 9.0 8.9 - 10.3 mg/dL   Total Protein 8.1 6.5 - 8.1 g/dL   Albumin 3.9 3.5 - 5.0 g/dL   AST 17 15 - 41 U/L   ALT 10 0 - 44 U/L   Alkaline Phosphatase 48 38 - 126 U/L   Total Bilirubin 0.9 0.3 - 1.2 mg/dL   GFR calc non Af Amer 33 (L) >60 mL/min   GFR calc Af Amer 39 (L) >60 mL/min   Anion gap 11 5 -  15    Comment: Performed at [facility], [address] [place], [place] [address]  Lipase, blood     Status: None   Collection Time: 05/26/19  8:47 AM  Result Value Ref Range   Lipase 28 11 - 51 U/L    Comment: Performed at [facility], [place], [place] [address]  Troponin I (High Sensitivity)     Status: Abnormal   Collection Time: 05/26/19  8:47 AM  Result Value Ref Range   Troponin I (High Sensitivity) 18 (H) <18 ng/L    Comment: (NOTE) Elevated high sensitivity troponin I (hsTnI) values and significant  changes across serial measurements may suggest ACS but many other  chronic and acute conditions are known to elevate hsTnI results.  Refer to the "Links" section for chest pain algorithms and additional  guidance. Performed at [facility], [address] [place], [place] [address]   CBC with Differential     Status: Abnormal   Collection Time: 05/26/19  8:47 AM  Result Value Ref Range   WBC 12.9 (H) 4.0 - 10.5 K/uL   RBC 4.83 4.22 - 5.81 MIL/uL    Hemoglobin 14.5 13.0 - 17.0 g/dL   HCT 44.5 39.0 - 52.0 %   MCV 92.1 80.0 - 100.0 fL   MCH 30.0 26.0 - 34.0 pg   MCHC 32.6 30.0 - 36.0 g/dL   RDW 12.9 11.5 - 15.5 %   Platelets 228 150 - 400 K/uL   nRBC 0.0 0.0 - 0.2 %   Neutrophils Relative % 64 %   Neutro Abs 8.3 (H) 1.7 - 7.7 K/uL   Lymphocytes Relative 24 %   Lymphs Abs 3.1 0.7 - 4.0 K/uL   Monocytes Relative 10 %   Monocytes Absolute 1.3 (H) 0.1 - 1.0 K/uL   Eosinophils Relative 1 %   Eosinophils Absolute 0.1 0.0 - 0.5 K/uL   Basophils Relative 0 %   Basophils Absolute 0.1 0.0 - 0.1 K/uL   Immature Granulocytes 1 %   Abs Immature Granulocytes 0.06 0.00 - 0.07 K/uL    Comment: Performed at [facility], [address] [place], [place] [address]  CK     Status: None   Collection Time: 05/26/19  8:47 AM  Result Value Ref Range   Total CK 73 49 - 397 U/L    Comment: Performed at [facility], [address] [place], [place] [address]  Magnesium     Status: None   Collection Time: 05/26/19  8:47 AM  Result Value Ref Range   Magnesium 2.0 1.7 - 2.4 mg/dL    Comment: Performed at [facility], [address] [place], [place] [address]  Phosphorus     Status: None   Collection Time: 05/26/19  8:47 AM  Result Value Ref Range   Phosphorus 2.7 2.5 - 4.6 mg/dL    Comment: Performed at [facility], [address] [place],  [address]  SARS Coronavirus 2 [facility] order, Performed in [facility] hospital lab) Nasopharyngeal Nasopharyngeal Swab     Status: None   Collection Time: 05/26/19  9:29 AM   Specimen: Nasopharyngeal Swab  Result Value Ref Range   SARS Coronavirus 2 NEGATIVE NEGATIVE    Comment: (NOTE) If result is NEGATIVE SARS-CoV-2 target nucleic acids are NOT DETECTED. The SARS-CoV-2 RNA is generally detectable in upper and lower  respiratory specimens during the acute phase of infection. The lowest  concentration of SARS-CoV-2 viral copies  this assay can detect is  250  copies / mL. A negative result does not preclude SARS-CoV-2 infection  and should not be used as the sole basis for treatment or other  patient management decisions.  A negative result may occur with  improper specimen collection / handling, submission of specimen other  than nasopharyngeal swab, presence of viral mutation(s) within the  areas targeted by this assay, and inadequate number of viral copies  (<250 copies / mL). A negative result must be combined with clinical  observations, patient history, and epidemiological information. If result is POSITIVE SARS-CoV-2 target nucleic acids are DETECTED. The SARS-CoV-2 RNA is generally detectable in upper and lower  respiratory specimens dur ing the acute phase of infection.  Positive  results are indicative of active infection with SARS-CoV-2.  Clinical  correlation with patient history and other diagnostic information is  necessary to determine patient infection status.  Positive results do  not rule out bacterial infection or co-infection with other viruses. If result is PRESUMPTIVE POSTIVE SARS-CoV-2 nucleic acids MAY BE PRESENT.   A presumptive positive result was obtained on the submitted specimen  and confirmed on repeat testing.  While 2019 novel coronavirus  (SARS-CoV-2) nucleic acids may be present in the submitted sample  additional confirmatory testing may be necessary for epidemiological  and / or clinical management purposes  to differentiate between  SARS-CoV-2 and other Sarbecovirus currently known to infect humans.  If clinically indicated additional testing with an alternate test  methodology [phone]) is advised. The SARS-CoV-2 RNA is generally  detectable in upper and lower respiratory sp ecimens during the acute  phase of infection. The expected result is Negative. Fact Sheet for Patients:  StrictlyIdeas.no Fact Sheet for Healthcare  Providers: BankingDealers.co.za This test is not yet approved or cleared by the [place] FDA and has been authorized for detection and/or diagnosis of SARS-CoV-2 by FDA under an Emergency Use Authorization (EUA).  This EUA will remain in effect (meaning this test can be used) for the duration of the COVID-19 declaration under Section 564(b)(1) of the Act, 21 U.S.C. section 360bbb-3(b)(1), unless the authorization is terminated or revoked sooner. Performed at [facility], [address] [place], [place] [address]   Acetaminophen level     Status: Abnormal   Collection Time: 05/26/19 11:17 AM  Result Value Ref Range   Acetaminophen (Tylenol), Serum <10 (L) 10 - 30 ug/mL    Comment: (NOTE) Therapeutic concentrations vary significantly. A range of 10-30 ug/mL  may be an effective concentration for many patients. However, some  are best treated at concentrations outside of this range. Acetaminophen concentrations >150 ug/mL at 4 hours after ingestion  and >50 ug/mL at 12 hours after ingestion are often associated with  toxic reactions. Performed at [facility], [address] [place], [place] [address]   Ethanol     Status: None   Collection Time: 05/26/19 11:17 AM  Result Value Ref Range   Alcohol, Ethyl (B) <10 <10 mg/dL    Comment: (NOTE) Lowest detectable limit for serum alcohol is 10 mg/dL. For medical purposes only. Performed at [facility], [address] [place], [place] [address]   Salicylate level     Status: None   Collection Time: 05/26/19 11:17 AM  Result Value Ref Range   Salicylate Lvl <5.1 2.8 - 30.0 mg/dL    Comment: Performed at [facility], [address] [place], [place] [address]  Troponin I (High Sensitivity)  Status: Abnormal   Collection Time: 05/26/19 11:18 AM  Result Value Ref Range   Troponin I (High Sensitivity) 23 (H) <18 ng/L    Comment: (NOTE) Elevated high sensitivity  troponin I (hsTnI) values and significant  changes across serial measurements may suggest ACS but many other  chronic and acute conditions are known to elevate hsTnI results.  Refer to the "Links" section for chest pain algorithms and additional  guidance. Performed at [facility], [address] [place], [place] [address]   TSH     Status: None   Collection Time: 05/26/19 11:18 AM  Result Value Ref Range   TSH 2.945 0.350 - 4.500 uIU/mL    Comment: Performed by a 3rd Generation assay with a functional sensitivity of <=0.01 uIU/mL. Performed at [facility], [address] [place], [place] [address]   Glucose, capillary     Status: Abnormal   Collection Time: 05/26/19  2:56 PM  Result Value Ref Range   Glucose-Capillary 110 (H) 70 - 99 mg/dL  MRSA PCR Screening     Status: None   Collection Time: 05/26/19  3:27 PM   Specimen: Nasopharyngeal  Result Value Ref Range   MRSA by PCR NEGATIVE NEGATIVE    Comment:        The GeneXpert MRSA Assay (FDA approved for NASAL specimens only), is one component of a comprehensive MRSA colonization surveillance program. It is not intended to diagnose MRSA infection nor to guide or monitor treatment for MRSA infections. Performed at [facility], [address] [place], [place] [address]   Glucose, capillary     Status: Abnormal   Collection Time: 05/26/19 10:11 PM  Result Value Ref Range   Glucose-Capillary 158 (H) 70 - 99 mg/dL  Basic metabolic panel     Status: Abnormal   Collection Time: 05/27/19  4:37 AM  Result Value Ref Range   Sodium 141 135 - 145 mmol/L   Potassium 4.1 3.5 - 5.1 mmol/L   Chloride 110 98 - 111 mmol/L   CO2 20 (L) 22 - 32 mmol/L   Glucose, Bld 152 (H) 70 - 99 mg/dL   BUN 23 8 - 23 mg/dL   Creatinine, Ser 1.50 (H) 0.61 - 1.24 mg/dL   Calcium 8.7 (L) 8.9 - 10.3 mg/dL   GFR calc non Af Amer 44 (L) >60 mL/min   GFR calc Af Amer 51 (L) >60 mL/min   Anion gap 11 5 - 15     Comment: Performed at [facility], [address] [place], [place] [address]  CBC     Status: Abnormal   Collection Time: 05/27/19  4:37 AM  Result Value Ref Range   WBC 7.6 4.0 - 10.5 K/uL   RBC 4.29 4.22 - 5.81 MIL/uL   Hemoglobin 12.5 (L) 13.0 - 17.0 g/dL   HCT 38.9 (L) 39.0 - 52.0 %   MCV 90.7 80.0 - 100.0 fL   MCH 29.1 26.0 - 34.0 pg   MCHC 32.1 30.0 - 36.0 g/dL   RDW 13.0 11.5 - 15.5 %   Platelets 203 150 - 400 K/uL   nRBC 0.0 0.0 - 0.2 %    Comment: Performed at [facility], [address] [place], [place] [address]  Magnesium     Status: None   Collection Time: 05/27/19  4:37 AM  Result Value Ref Range   Magnesium 2.0 1.7 - 2.4 mg/dL    Comment: Performed at [facility], [place]  [address], [place], [place] [address]  Phosphorus     Status: None   Collection Time: 05/27/19  4:37 AM  Result Value Ref Range   Phosphorus 3.3 2.5 - 4.6 mg/dL    Comment: Performed at [facility], [address] [place], [place] [address]  Glucose, capillary     Status: Abnormal   Collection Time: 05/27/19  7:39 AM  Result Value Ref Range   Glucose-Capillary 149 (H) 70 - 99 mg/dL    Ct Angio Chest Pe W And/or Wo Contrast  Result Date: 05/26/2019 CLINICAL DATA:  Short of breath tachycardia. History of prostate cancer. Lung mass. EXAM: CT ANGIOGRAPHY CHEST WITH CONTRAST TECHNIQUE: Multidetector CT imaging of the chest was performed using the standard protocol during bolus administration of intravenous contrast. Multiplanar CT image reconstructions and MIPs were obtained to evaluate the vascular anatomy. CONTRAST:  36mL OMNIPAQUE IOHEXOL 350 MG/ML SOLN COMPARISON:  CT chest 05/24/2019 FINDINGS: Cardiovascular: Negative for pulmonary embolism. Pulmonary arteries normal in caliber with normal enhancement Atherosclerotic aorta without aneurysm. Extensive coronary calcification. Heart size normal without pericardial effusion. Mediastinum/Nodes: Negative for mass  or adenopathy. Lungs/Pleura: Spiculated mass right posterior apical segment measuring approximately 4.2 by 3.7 cm. Small area of central cavitation. Lung mass abuts the pleural surface and is similar in size to the recent study. No pleural effusion. No other lung nodules. Negative for pneumonia. Apically emphysema bilaterally. Upper Abdomen: Gallstones. Musculoskeletal: No acute skeletal abnormality. Review of the MIP images confirms the above findings. IMPRESSION: 1. Negative for pulmonary embolism 2. Atherosclerotic aorta.  Extensive coronary calcification 3. Spiculated mass right posterior apical segment unchanged. Findings compatible with carcinoma of the lung. 4. Cholelithiasis. Electronically Signed   By: Franchot Gallo M.D.   On: 05/26/2019 10:35    Review of Systems  Constitutional: Positive for malaise/fatigue.  HENT: Positive for congestion.   Eyes: Negative.   Respiratory: Positive for shortness of breath.   Cardiovascular: Positive for chest pain and palpitations.  Gastrointestinal: Negative.   Genitourinary: Negative.   Musculoskeletal: Negative.   Skin: Negative.   Neurological: Negative.   Endo/Heme/Allergies: Negative.   Psychiatric/Behavioral: Negative.    Blood pressure (!) 145/71, pulse (!) 54, temperature 98.1 F (36.7 C), resp. rate 20, height 5\' 10"  (1.778 m), weight 97.4 kg, SpO2 98 %. Physical Exam  Nursing note and vitals reviewed. Constitutional: He is oriented to person, place, and time. He appears well-developed and well-nourished.  HENT:  Head: Normocephalic and atraumatic.  Eyes: Pupils are equal, round, and reactive to light. Conjunctivae and EOM are normal.  Neck: Normal range of motion. Neck supple.  Cardiovascular: Normal rate, regular rhythm and normal heart sounds.  Respiratory: Effort normal and breath sounds normal.  GI: Soft. Bowel sounds are normal.  Musculoskeletal: Normal range of motion.  Neurological: He is alert and oriented to person, place,  and time. He has normal reflexes.  Skin: Skin is warm and dry.  Psychiatric: He has a normal mood and affect.    Assessment/Plan: SVT Preop for lung biopsy Lung mass Chronic renal sufficiency Diabetes type 2 Hyperlipidemia DJD . Plan Agree with cardioversion with adenosine Continue telemetry follow-up and evaluation Recommend conservative medical therapy for now Do not recommend antiarrhythmic at this point Consider low-dose calcium or beta-blocker to help with rate management Follow-up with renal insufficiency with nephrology Statin therapy for hyper lipid with Pravachol  Continue diet control for diabetes management Continue prostate cancer therapy with Casodex Follow-up with cardiology as needed 1 to 2 weeks  Vesper Trant D Jaela Yepez 05/27/2019, 4:33 PM

## 2019-05-27 NOTE — Progress Notes (Signed)
Ch visited pt based on a referral from ch. Pt was alert and dressed sitting upright in bed. Ch provided compassionate presence with pt that shared he was about to be d/c and he was waiting for his family to pick him up. Pt shared that his surgery would be reschedule for Monday 8.10.20 but he was not sure of the exact time. Ch shared words of encouragement and wished him safe travels home.    05/27/19 1000  Clinical Encounter Type  Visited With Patient  Visit Type Follow-up;Social support  Referral From Chaplain  Consult/Referral To Chaplain  Spiritual Encounters  Spiritual Needs Emotional;Grief support  Stress Factors  Patient Stress Factors Health changes;Major life changes  Family Stress Factors None identified

## 2019-05-27 NOTE — Clinical Social Work Note (Signed)
Patient left hospital before code 61 could be issued. Tried calling both of his phone numbers but there was no answer.  Dayton Scrape, [place]

## 2019-05-27 NOTE — Progress Notes (Signed)
Late note 1035- Discharged home after cleared per Cardiology Dr. Clayborn Bigness. Discharge teaching done and questions answered before discharge.

## 2019-05-27 NOTE — TOC Transition Note (Signed)
Transition of Care [facility]) - CM/SW Discharge Note   Patient Details  Name: Carlos Levine MRN: [id number] Date of Birth: 11/16/1940  [facility]) CM/SW Contact:  Candie Chroman, LCSW Phone Number: 05/27/2019, 10:56 AM   Clinical Narrative: CSW met with patient, introduced role, and explained that discharge planning would be discussed. Patient has orders for home health RN, PT, and aide but he stated he is basically independent at home and did not need those services. Patient has a cane but said he rarely uses it at home. His son will be picking him up today. No further concerns. CSW signing off.    Final next level of care: Home/Self Care     Patient Goals and CMS Choice     Choice offered to / list presented to : NA  Discharge Placement                       Discharge Plan and Services     Post Acute Care Choice: NA                               Social Determinants of Health (SDOH) Interventions     Readmission Risk Interventions No flowsheet data found.

## 2019-05-29 LAB — ECHOCARDIOGRAM COMPLETE
Height: 70 in
Weight: 3435.65 oz

## 2019-05-30 ENCOUNTER — Other Ambulatory Visit: Payer: Self-pay

## 2019-05-30 ENCOUNTER — Encounter: Payer: Self-pay | Admitting: Anesthesiology

## 2019-05-30 ENCOUNTER — Ambulatory Visit: Payer: Medicare Other

## 2019-05-30 ENCOUNTER — Encounter
Admission: RE | Disposition: A | Discharge status: Still In Hospital | Payer: Self-pay | Source: Home / Self Care | Attending: Pulmonary Disease

## 2019-05-30 ENCOUNTER — Encounter: Payer: Self-pay | Admitting: Emergency Medicine

## 2019-05-30 ENCOUNTER — Emergency Department
Admission: EM | Admit: 2019-05-30 | Discharge: 2019-05-30 | Disposition: A | Discharge status: Home / Self Care | Payer: Medicare Other | Source: Home / Self Care | Attending: Student | Admitting: Student

## 2019-05-30 ENCOUNTER — Ambulatory Visit
Admission: RE | Admit: 2019-05-30 | Discharge: 2019-05-30 | Disposition: A | Discharge status: Still In Hospital | Payer: Medicare Other | Attending: Pulmonary Disease | Admitting: Pulmonary Disease

## 2019-05-30 DIAGNOSIS — Z87891 Personal history of nicotine dependence: Secondary | ICD-10-CM | POA: Insufficient documentation

## 2019-05-30 DIAGNOSIS — M48062 Spinal stenosis, lumbar region with neurogenic claudication: Secondary | ICD-10-CM | POA: Insufficient documentation

## 2019-05-30 DIAGNOSIS — G8929 Other chronic pain: Secondary | ICD-10-CM | POA: Insufficient documentation

## 2019-05-30 DIAGNOSIS — I471 Supraventricular tachycardia: Secondary | ICD-10-CM | POA: Insufficient documentation

## 2019-05-30 DIAGNOSIS — E1122 Type 2 diabetes mellitus with diabetic chronic kidney disease: Secondary | ICD-10-CM | POA: Insufficient documentation

## 2019-05-30 DIAGNOSIS — C3490 Malignant neoplasm of unspecified part of unspecified bronchus or lung: Secondary | ICD-10-CM | POA: Diagnosis present

## 2019-05-30 DIAGNOSIS — N529 Male erectile dysfunction, unspecified: Secondary | ICD-10-CM | POA: Insufficient documentation

## 2019-05-30 DIAGNOSIS — K219 Gastro-esophageal reflux disease without esophagitis: Secondary | ICD-10-CM | POA: Insufficient documentation

## 2019-05-30 DIAGNOSIS — N183 Chronic kidney disease, stage 3 (moderate): Secondary | ICD-10-CM | POA: Insufficient documentation

## 2019-05-30 DIAGNOSIS — I129 Hypertensive chronic kidney disease with stage 1 through stage 4 chronic kidney disease, or unspecified chronic kidney disease: Secondary | ICD-10-CM | POA: Insufficient documentation

## 2019-05-30 DIAGNOSIS — Z8546 Personal history of malignant neoplasm of prostate: Secondary | ICD-10-CM | POA: Insufficient documentation

## 2019-05-30 DIAGNOSIS — E7800 Pure hypercholesterolemia, unspecified: Secondary | ICD-10-CM | POA: Diagnosis not present

## 2019-05-30 DIAGNOSIS — J449 Chronic obstructive pulmonary disease, unspecified: Secondary | ICD-10-CM | POA: Diagnosis not present

## 2019-05-30 DIAGNOSIS — M545 Low back pain: Secondary | ICD-10-CM | POA: Insufficient documentation

## 2019-05-30 DIAGNOSIS — M1990 Unspecified osteoarthritis, unspecified site: Secondary | ICD-10-CM | POA: Diagnosis not present

## 2019-05-30 DIAGNOSIS — E1151 Type 2 diabetes mellitus with diabetic peripheral angiopathy without gangrene: Secondary | ICD-10-CM | POA: Insufficient documentation

## 2019-05-30 DIAGNOSIS — Z85118 Personal history of other malignant neoplasm of bronchus and lung: Secondary | ICD-10-CM | POA: Insufficient documentation

## 2019-05-30 DIAGNOSIS — I10 Essential (primary) hypertension: Secondary | ICD-10-CM | POA: Insufficient documentation

## 2019-05-30 DIAGNOSIS — Z87442 Personal history of urinary calculi: Secondary | ICD-10-CM | POA: Insufficient documentation

## 2019-05-30 DIAGNOSIS — E785 Hyperlipidemia, unspecified: Secondary | ICD-10-CM | POA: Diagnosis not present

## 2019-05-30 DIAGNOSIS — Z79899 Other long term (current) drug therapy: Secondary | ICD-10-CM | POA: Insufficient documentation

## 2019-05-30 DIAGNOSIS — Z7984 Long term (current) use of oral hypoglycemic drugs: Secondary | ICD-10-CM | POA: Diagnosis not present

## 2019-05-30 DIAGNOSIS — Z89011 Acquired absence of right thumb: Secondary | ICD-10-CM | POA: Insufficient documentation

## 2019-05-30 DIAGNOSIS — C3491 Malignant neoplasm of unspecified part of right bronchus or lung: Secondary | ICD-10-CM | POA: Diagnosis not present

## 2019-05-30 DIAGNOSIS — I454 Nonspecific intraventricular block: Secondary | ICD-10-CM | POA: Diagnosis not present

## 2019-05-30 HISTORY — PX: VIDEO BRONCHOSCOPY WITH ENDOBRONCHIAL NAVIGATION: SHX6175

## 2019-05-30 HISTORY — PX: VIDEO BRONCHOSCOPY WITH ENDOBRONCHIAL ULTRASOUND: SHX6177

## 2019-05-30 LAB — GLUCOSE, CAPILLARY: Glucose-Capillary: 167 mg/dL — ABNORMAL HIGH (ref 70–99)

## 2019-05-30 LAB — CBC WITH DIFFERENTIAL/PLATELET
Abs Immature Granulocytes: 0.04 10*3/uL (ref 0.00–0.07)
Basophils Absolute: 0 10*3/uL (ref 0.0–0.1)
Basophils Relative: 0 %
Eosinophils Absolute: 0.1 10*3/uL (ref 0.0–0.5)
Eosinophils Relative: 1 %
HCT: 41.7 % (ref 39.0–52.0)
Hemoglobin: 13.6 g/dL (ref 13.0–17.0)
Immature Granulocytes: 0 %
Lymphocytes Relative: 32 %
Lymphs Abs: 3 10*3/uL (ref 0.7–4.0)
MCH: 29.5 pg (ref 26.0–34.0)
MCHC: 32.6 g/dL (ref 30.0–36.0)
MCV: 90.5 fL (ref 80.0–100.0)
Monocytes Absolute: 0.9 10*3/uL (ref 0.1–1.0)
Monocytes Relative: 9 %
Neutro Abs: 5.6 10*3/uL (ref 1.7–7.7)
Neutrophils Relative %: 58 %
Platelets: 227 10*3/uL (ref 150–400)
RBC: 4.61 MIL/uL (ref 4.22–5.81)
RDW: 12.8 % (ref 11.5–15.5)
WBC: 9.7 10*3/uL (ref 4.0–10.5)
nRBC: 0 % (ref 0.0–0.2)

## 2019-05-30 LAB — COMPREHENSIVE METABOLIC PANEL
ALT: 11 U/L (ref 0–44)
AST: 17 U/L (ref 15–41)
Albumin: 3.8 g/dL (ref 3.5–5.0)
Alkaline Phosphatase: 48 U/L (ref 38–126)
Anion gap: 11 (ref 5–15)
BUN: 20 mg/dL (ref 8–23)
CO2: 19 mmol/L — ABNORMAL LOW (ref 22–32)
Calcium: 9.1 mg/dL (ref 8.9–10.3)
Chloride: 110 mmol/L (ref 98–111)
Creatinine, Ser: 1.54 mg/dL — ABNORMAL HIGH (ref 0.61–1.24)
GFR calc Af Amer: 49 mL/min — ABNORMAL LOW (ref >60)
GFR calc non Af Amer: 42 mL/min — ABNORMAL LOW (ref >60)
Glucose, Bld: 158 mg/dL — ABNORMAL HIGH (ref 70–99)
Potassium: 4.5 mmol/L (ref 3.5–5.1)
Sodium: 140 mmol/L (ref 135–145)
Total Bilirubin: 0.8 mg/dL (ref 0.3–1.2)
Total Protein: 7.7 g/dL (ref 6.5–8.1)

## 2019-05-30 LAB — TROPONIN I (HIGH SENSITIVITY)
Troponin I (High Sensitivity): 15 ng/L (ref <18)
Troponin I (High Sensitivity): 18 ng/L — ABNORMAL HIGH (ref <18)

## 2019-05-30 LAB — MAGNESIUM: Magnesium: 1.9 mg/dL (ref 1.7–2.4)

## 2019-05-30 SURGERY — BRONCHOSCOPY, WITH EBUS
Anesthesia: General

## 2019-05-30 MED ORDER — ADENOSINE 12 MG/4ML IV SOLN
12.0000 mg | Freq: Once | INTRAVENOUS | Status: AC
  Administered 2019-05-30: 13:00:00 12 mg via INTRAVENOUS

## 2019-05-30 MED ORDER — FENTANYL CITRATE (PF) 100 MCG/2ML IJ SOLN
INTRAMUSCULAR | Status: AC
  Filled 2019-05-30: qty 2

## 2019-05-30 MED ORDER — ADENOSINE 6 MG/2ML IV SOLN
6.0000 mg | Freq: Once | INTRAVENOUS | Status: AC
  Administered 2019-05-30: 6 mg via INTRAVENOUS

## 2019-05-30 MED ORDER — DILTIAZEM HCL ER COATED BEADS 120 MG PO CP24
120.0000 mg | ORAL_CAPSULE | Freq: Once | ORAL | Status: AC
  Administered 2019-05-30: 120 mg via ORAL
  Filled 2019-05-30: qty 1

## 2019-05-30 MED ORDER — ADENOSINE 6 MG/2ML IV SOLN
INTRAVENOUS | Status: AC
  Administered 2019-05-30: 6 mg via INTRAVENOUS
  Filled 2019-05-30: qty 2

## 2019-05-30 MED ORDER — ONDANSETRON HCL 4 MG/2ML IJ SOLN
INTRAMUSCULAR | Status: AC
  Filled 2019-05-30: qty 2

## 2019-05-30 MED ORDER — ADENOSINE 12 MG/4ML IV SOLN
INTRAVENOUS | Status: AC
  Administered 2019-05-30: 12 mg via INTRAVENOUS
  Filled 2019-05-30: qty 4

## 2019-05-30 MED ORDER — DILTIAZEM HCL ER COATED BEADS 120 MG PO CP24: 120.0000 mg | ORAL_CAPSULE | Freq: Every day | ORAL | 1 refills | Status: DC

## 2019-05-30 MED ORDER — PROPOFOL 10 MG/ML IV BOLUS
INTRAVENOUS | Status: AC
  Filled 2019-05-30: qty 20

## 2019-05-30 MED ORDER — SUCCINYLCHOLINE CHLORIDE 20 MG/ML IJ SOLN
INTRAMUSCULAR | Status: AC
  Filled 2019-05-30: qty 1

## 2019-05-30 MED ORDER — ROCURONIUM BROMIDE 50 MG/5ML IV SOLN
INTRAVENOUS | Status: AC
  Filled 2019-05-30: qty 1

## 2019-05-30 MED ORDER — SEVOFLURANE IN SOLN
RESPIRATORY_TRACT | Status: AC
  Filled 2019-05-30: qty 250

## 2019-05-30 MED ORDER — SUGAMMADEX SODIUM 200 MG/2ML IV SOLN
INTRAVENOUS | Status: AC
  Filled 2019-05-30: qty 2

## 2019-05-30 NOTE — Discharge Instructions (Addendum)
Thank you for letting us take care of you in the emergency department today.   Please continue to take your regular, prescribed medications.   New medications we have prescribed:  - Cardizem (diltiazem) 120 mg 24 hour capsule (take it at the same time daily)  Please follow up with: - Your primary care doctor to review your ER visit and follow up on your symptoms.  - He has follow-up with the cardiologist, Dr. Nehemiah Massed, tomorrow in the clinic.  Please call to confirm your time.  Please return to the ER for any new or worsening symptoms.

## 2019-05-30 NOTE — ED Triage Notes (Signed)
Patient was sent to ED from pre-op due to increased heart rate. Patient with HR in 160s upon arrival. Patient denies CP or new SOB. Patient was admitted last week for SVT.

## 2019-05-30 NOTE — Anesthesia Preprocedure Evaluation (Deleted)
Anesthesia Evaluation  Patient identified by MRN, date of birth, ID band Patient awake    Reviewed: Allergy & Precautions, NPO status , Patient's Chart, lab work & pertinent test results  Airway        Dental   Pulmonary shortness of breath and with exertion, former smoker,           Cardiovascular hypertension,      Neuro/Psych negative neurological ROS  negative psych ROS   GI/Hepatic negative GI ROS,   Endo/Other  diabetes  Renal/GU Renal InsufficiencyRenal disease     Musculoskeletal  (+) Arthritis , Osteoarthritis,    Abdominal   Peds negative pediatric ROS (+)  Hematology   Anesthesia Other Findings Past Medical History: No date: Arthritis No date: Cancer ([place])     Comment:  prostate No date: Chronic kidney disease No date: Diabetes mellitus without complication ([facility]) No date: History of kidney stones No date: Hyperlipidemia No date: Prostate cancer ([place])  Reproductive/Obstetrics                             Anesthesia Physical Anesthesia Plan  ASA: III  Anesthesia Plan: General   Post-op Pain Management:    Induction: Intravenous  PONV Risk Score and Plan:   Airway Management Planned: Oral ETT  Additional Equipment:   Intra-op Plan:   Post-operative Plan: Extubation in OR  Informed Consent: I have reviewed the patients History and Physical, chart, labs and discussed the procedure including the risks, benefits and alternatives for the proposed anesthesia with the patient or authorized representative who has indicated his/her understanding and acceptance.     Dental advisory given  Plan Discussed with: CRNA and Surgeon  Anesthesia Plan Comments:         Anesthesia Quick Evaluation

## 2019-05-30 NOTE — ED Provider Notes (Signed)
[facility] Emergency Department Provider Note  ____________________________________________   First MD Initiated Contact with Patient 05/30/19 1236     (approximate)  I have reviewed the triage vital signs and the nursing notes.  History  Chief Complaint Tachycardia    HPI Carlos Levine is a 78 y.o. male with history of CKD, diabetes, prostate cancer, lung cancer, SVT, who presents emergency department in asymptomatic, incidentally found SVT.  Patient was at a same day surgery visit this morning (with pulmonology for a biopsy)  when he was found to be in SVT.  Patient denies any symptoms, including palpitations, chest pain, shortness of breath, lightheadedness, or syncope.  Patient was recently seen and admitted for the same from 8/6 to 8/7, was not discharged on any rate control or antiarrhythmics.  Work-up included a CTA that was negative for PE, as well as echocardiogram with EF 55 to 60%.         Past Medical Hx Past Medical History:  Diagnosis Date  . Arthritis   . Cancer [facility])    prostate  . Chronic kidney disease   . Diabetes mellitus without complication ([place])   . History of kidney stones   . Hyperlipidemia   . Prostate cancer [facility])     Problem List Patient Active Problem List   Diagnosis Date Noted  . SOB (shortness of breath) 05/26/2019  . SVT (supraventricular tachycardia) ([place]) 05/26/2019  . Malignant neoplasm of right lung ([place])   . Squamous cell lung cancer, right ([place]) 04/20/2019  . Localized enlarged lymph nodes 03/29/2019  . Solitary pulmonary nodule 03/29/2019  . Goals of care, counseling/discussion 03/29/2019  . Chronic bilateral low back pain without sciatica 02/21/2018  . Chronic constipation 11/19/2017  . Hyperkalemia 11/19/2017  . Elevated troponin 06/11/2017  . Dizziness 06/24/2016  . 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 Sore of lower lip 05/13/2015  . Benign essential hypertension  02/02/2015  . Type 2 diabetes mellitus with diabetic chronic kidney disease ([place]) 02/02/2015  . Pure hypercholesterolemia 02/02/2015  . Lumbar degenerative disc disease 02/02/2015  . CKD (chronic kidney disease) stage 3, GFR 30-59 ml/min (HCC) 05/12/2014  . Malignant neoplasm of prostate ([place]) 07/14/2012  . Hypertrophy of breast 07/14/2012  . ED (erectile dysfunction) of organic origin 07/14/2012    Past Surgical Hx Past Surgical History:  Procedure Laterality Date  . AMPUTATION FINGER / THUMB Right   . PROSTATE CRYOABLATION    . SPINAL FUSION      Medications Prior to Admission medications   Medication Sig Start Date End Date Taking? Authorizing Provider  bicalutamide (CASODEX) 50 MG tablet Take 50 mg by mouth daily at 12 noon. 05/09/19   [provider]  pravastatin (PRAVACHOL) 80 MG tablet Take 80 mg by mouth at bedtime.  01/02/19   [provider]    Allergies Contrast media [iodinated diagnostic agents] and Ace inhibitors  Family Hx Family History  Problem Relation Age of Onset  . Lung cancer Father   . Pancreatic cancer Father   . Brain cancer Mother   . Stomach cancer Paternal Aunt   . Stomach cancer Paternal Uncle   . Cancer Maternal Grandmother   . Kidney cancer Maternal Grandfather     Social Hx Social History   Tobacco Use  . Smoking status: Former Smoker    Years: 30.00    Types: Cigarettes    Quit date: 10/20/2009    Years since quitting: 9.6  . Smokeless tobacco: Never  Used  Substance Use Topics  . Alcohol use: No  . Drug use: No     Review of Systems  Constitutional: Negative for fever. Negative for chills. Eyes: Negative for visual changes. ENT: Negative for sore throat. Cardiovascular: Negative for chest pain. Respiratory: Negative for shortness of breath. Gastrointestinal: Negative for abdominal pain. Negative for nausea. Negative for vomiting. Genitourinary: Negative for dysuria. Musculoskeletal: Negative for leg  swelling. Skin: Negative for rash. Neurological: Negative for for headaches.   Physical Exam  Vital Signs: ED Triage Vitals  Enc Vitals Group     BP --      Pulse Rate 05/30/19 1232 (!) 160     Resp 05/30/19 1232 (!) 24     Temp --      Temp src --      SpO2 --      Weight 05/30/19 1233 223 lb (101.2 kg)     Height 05/30/19 1233 5\' 10"  (1.778 m)     Head Circumference --      Peak Flow --      Pain Score 05/30/19 1233 0     Pain Loc --      Pain Edu? --      Excl. in [place]? --     Constitutional: Alert and oriented.  Eyes: Conjunctivae clear. Sclera anicteric. Head: Normocephalic. Atraumatic. Nose: No congestion. No rhinorrhea. Mouth/Throat: Mucous membranes are moist.  Neck: No stridor.   Cardiovascular: Tachycardic, regular rhythm. No murmurs. Extremities well perfused. Respiratory: Normal respiratory effort.  Lungs CTAB. Gastrointestinal: Soft and non-tender. No distention.  Musculoskeletal: No lower extremity edema. Neurologic:  Normal speech and language. No gross focal neurologic deficits are appreciated.  Skin: Skin is warm, dry and intact. No rash noted. Psychiatric: Mood and affect are appropriate for situation.  EKG  Personally reviewed.   Initial EKG at 12:05 PM Rate: tachycardic, 160s Rhythm: SVT Axis: leftward Intervals: within normal limits ST elevates in aVR and diffuse depressions, likely rate related  Repeat EKG at 1:05 PM, status post conversion after adenosine (6 mg, then 12 mg) Rate: 78 Rhythm: sinus Axis: leftward Intervals: PR 258 ms Resolution of ST changes compared to prior No acute ischemic changes    Radiology  N/A   Procedures  Procedure(s) performed (including critical care):  .Critical Care Performed by: Lilia Pro., MD Authorized by: Lilia Pro., MD   Critical care provider statement:    Critical care time (minutes):  30   Critical care was necessary to treat or prevent imminent or life-threatening  deterioration of the following conditions:  Cardiac failure   Critical care was time spent personally by me on the following activities:  Discussions with consultants, evaluation of patient's response to treatment, examination of patient, ordering and performing treatments and interventions, ordering and review of laboratory studies, ordering and review of radiographic studies, pulse oximetry, re-evaluation of patient's condition, obtaining history from patient or surrogate and review of old charts     Initial Impression / Assessment and Plan / ED Course  78 y.o. male  with history of CKD, diabetes, prostate cancer, lung cancer, SVT, who presents emergency department in asymptomatic, incidentally found SVT.  Patient is hemodynamically stable, no hypotension. He is asymptomatic. Unable to convert with vagal maneuvers or valsalva. Patient agreeable to adenosine.   Patient converted briefly to NSR s/p adenosine 6 mg, then converted back into SVT after several seconds.  Repeat dose of 12 mg with successful conversion.  Repeat EKG demonstrates sinus rhythm at  80s, with resolution of the prior ST changes, likely rate related.  Discussed with cardiology, Dr. Nehemiah Massed, advise initiating 120 mg Cardizem and he will follow-up in clinic tomorrow.  Will give first dose here, monitor to ensure no recurrence of SVT, follow basic labs.  Patient has remained in sinus rhythm, normal HR throughout observation. Continues to feel well. Electrolytes WNL. HS troponin x 2 are both 18 or less, delta less than 5, and improved from his admission (and likely rate related).  Patient desires discharge, will provide Rx for Cardizem and follow up in clinic tomorrow. Given return precautions.     Final Clinical Impression(s) / ED Diagnosis  Final diagnoses:  SVT (supraventricular tachycardia) ([place])       Note:  This document was prepared using Dragon voice recognition software and may include unintentional dictation  errors.   Lilia Pro., MD 05/30/19 2322

## 2019-05-30 NOTE — H&P (Signed)
DIVISION OF PULMONARY AND CRITICAL CARE MEDICINE                                                              INITIAL PATIENT ENCOUNTER                                         Chief Complaint: Squamous Cell lung CA and    HPI :  [name] is 78 y.o. male who was referred to [facility] Pulmonary clinic due to Lung Cancer and COPD.  He is an ex smoker who quit in 2011, he smoked from age 21 to 58, but stopped in between for 17 years and again in 69 years, smoking 1.5 packs daily, now total appx 45 pack years.   He had 4cm right lung mass s/p CT guided biopsy with findings of sq cell lung ca.  He has not had mediastinal staging. He had PET CT with no evidence of metastatic disease. He has cough with phlegm on expectoration. He had PFT done today which shows Mild COPD with marginal bronchodilator response.     Past medical History: He  has a past medical history of Constipation, Dermatophytosis of nail, Essential hypertension, benign, History of prostate cancer, Pure hypercholesterolemia, PVD (peripheral vascular disease) (CMS-HCC), and Type II or unspecified type diabetes mellitus without mention of complication, not stated as uncontrolled (CMS-HCC) (1995).    is allergic to iodinated contrast media; ace inhibitors; and metrizamide.  ROS:   Remainder has been reviewed and is negative except as per HPI  Current Medications (including changes made at this visit) CurrentMedications        Current Outpatient Medications  Medication Sig Dispense Refill  . pravastatin (PRAVACHOL) 80 MG tablet Take 1 tablet (80 mg total) by mouth nightly 90 tablet 3  . pioglitazone (ACTOS) 15 MG tablet Take 1 tablet (15 mg total) by mouth once daily (Patient not taking: Reported on 03/08/2019  ) 90 tablet 1   No current facility-administered medications for this visit.       Social History:  He  reports that he  quit smoking about 8 years ago. His smoking use included cigarettes. He has a 43.50 pack-year smoking history. He has never used smokeless tobacco. He reports that he does not drink alcohol or use drugs.  Surgical History:  has a past surgical history that includes Posterior fusion lumbar spine (1970's); Amputation finger / thumb (Right); and prostate surgery.  Family History: His family history includes Cancer in his mother; High blood pressure (Hypertension) in his mother; Obesity in his mother; Pancreatic cancer in his father.  Physical Exam: BP 139/70   Pulse 89   Temp 36.7 C (98 F)   Wt (!) 102.5 kg (226 lb)   SpO2 98%   BMI 33.86 kg/m  GENERAL: NAD, able to speak in complete sentences without dyspnea or cough HEENT: normocephalic, PERRL, EOMI, TM with sharp light reflex bilaterally.  Normal external  auditory canal. No hypertrophy of nasal turbinates.  Clear mucosa in mouth without any exudates or erythema NECK: Supple, no jugular venous distension, nodes, stridor nor thyromegaly. Trachea midline.  CARDIOVASCULAR: RRR, no murmurs, gallops, rubs RESPIRATORY: normal respiratory effort, clear to auscultation bilaterally. No crackles, or wheezes GASTROINTESTINAL: NABS, soft, non tender, non distended EXTR: No edema, homans sign or cyanosis LYMPHATIC: No nodes in neck or supraclavicular areas INTEGUMENTARY: No rashes, bruising, telantagias, sclerodactaly nor  alopecia NEURO:  Cranial nerves, motor, sensation intact. Normal gait   Labs & Imaging:   IMPRESSION: 4.1 x 4.0 x 3.3 cm diameter cavitary mass in posterior segment RIGHT upper lobe abutting pleural surfaces at the posterior upper RIGHT hemithorax and at the major fissure consistent with a primary pulmonary neoplasm.   I have personally reviewed all above lab data and Imaging today and discussed pertinent findings with patient.   MedicalDecision Making:   Impression/Plan:     Squamous Cell lung cancer       -4cm Right lung cavitary mass      - Patient had PET CT done with no evidence of mets      - 42mm right hilar lymph node enlargment on CT chest limited review due to lack of contrast       -Discussed with oncologist Dr. Tasia Catchings, will schedule for EBUS assisted lymph node biopsies for mediastinal staging    COPD grade 1 A Current inhalers:  Smoking history and Cessation:  Social History       Tobacco Use  Smoking Status Former Smoker  . Packs/day: 1.50  . Years: 29.00  . Pack years: 43.50  . Types: Cigarettes  . Last attempt to quit: 10/20/2010  . Years since quitting: 8.5  Smokeless Tobacco Never Used   Counseling given: Not Answered   PFTs:  Mild COPD Significant Imaging and Lab findings: none  Recent respiratory hospitalization  None  Phenotype:  GERD complaints during visit today: none  Depression screening PHQ 2/9 last 3 flowsheet values       Patient relates understanding our goals for this visit and is agreeable to above plans.        Claudette Stapler, MD [place]  Division of Pulmonary & Critical Care Medicine

## 2019-05-30 NOTE — Progress Notes (Signed)
Upon arrival vital signs taken and HR elevated to 166. anes notified EKG done. Anes recommended to take to ED

## 2019-05-30 NOTE — ED Notes (Signed)
6 mg adenosine given. Pt converted for approximately 15 seconds then reverted back to SVT.

## 2019-05-30 NOTE — ED Notes (Signed)
Patient given 12 mg adenosine IV. Patient converted to NSR in the 80s for approximately 1 minute. Patient dropped HR into 40s, SB, with pauses.

## 2019-05-31 ENCOUNTER — Encounter: Payer: Self-pay | Admitting: Pulmonary Disease

## 2019-06-01 ENCOUNTER — Other Ambulatory Visit: Payer: Self-pay

## 2019-06-01 ENCOUNTER — Ambulatory Visit
Admission: RE | Admit: 2019-06-01 | Discharge: 2019-06-01 | Disposition: A | Discharge status: Home / Self Care | Payer: Medicare Other | Source: Ambulatory Visit | Attending: Radiation Oncology | Admitting: Radiation Oncology

## 2019-06-01 ENCOUNTER — Inpatient Hospital Stay: Payer: Medicare Other | Attending: Oncology | Admitting: Oncology

## 2019-06-01 ENCOUNTER — Encounter: Payer: Self-pay | Admitting: *Deleted

## 2019-06-01 ENCOUNTER — Ambulatory Visit: Payer: Medicare Other

## 2019-06-01 ENCOUNTER — Encounter: Payer: Self-pay | Admitting: Oncology

## 2019-06-01 VITALS — BP 146/74 | HR 66 | Temp 97.5°F | Resp 20 | Wt 225.1 lb

## 2019-06-01 DIAGNOSIS — C3411 Malignant neoplasm of upper lobe, right bronchus or lung: Secondary | ICD-10-CM | POA: Diagnosis not present

## 2019-06-01 DIAGNOSIS — E785 Hyperlipidemia, unspecified: Secondary | ICD-10-CM | POA: Diagnosis not present

## 2019-06-01 DIAGNOSIS — Z87891 Personal history of nicotine dependence: Secondary | ICD-10-CM | POA: Insufficient documentation

## 2019-06-01 DIAGNOSIS — Z79899 Other long term (current) drug therapy: Secondary | ICD-10-CM | POA: Insufficient documentation

## 2019-06-01 DIAGNOSIS — N189 Chronic kidney disease, unspecified: Secondary | ICD-10-CM | POA: Diagnosis not present

## 2019-06-01 DIAGNOSIS — C3491 Malignant neoplasm of unspecified part of right bronchus or lung: Secondary | ICD-10-CM | POA: Diagnosis present

## 2019-06-01 DIAGNOSIS — C61 Malignant neoplasm of prostate: Secondary | ICD-10-CM | POA: Diagnosis not present

## 2019-06-01 DIAGNOSIS — E119 Type 2 diabetes mellitus without complications: Secondary | ICD-10-CM | POA: Insufficient documentation

## 2019-06-01 DIAGNOSIS — Z510 Encounter for antineoplastic radiation therapy: Secondary | ICD-10-CM | POA: Diagnosis not present

## 2019-06-01 DIAGNOSIS — Z7189 Other specified counseling: Secondary | ICD-10-CM

## 2019-06-01 NOTE — Progress Notes (Signed)
Pt in for follow up, reports shortness of breath, denies pain.  Request to call son during visit.

## 2019-06-01 NOTE — Progress Notes (Addendum)
  Oncology Nurse Navigator Documentation  Navigator Location: [facility] (06/01/19 1000)   )Navigator Encounter Type: Telephone (06/01/19 1000) Telephone: [place] [facility] Call (06/01/19 1000)                       Barriers/Navigation Needs: Family Concerns (06/01/19 1000)   Interventions: None Required (06/01/19 1000)       phone call made to patient to review upcoming appts today for CT simulation and follow up appt with Dr. Tasia Catchings. All questions and concerns addressed during phone call. Instructed pt to call back if has any further questions or needs. Pt verbalized understanding.               Time Spent with Patient: 30 (06/01/19 1000)

## 2019-06-02 NOTE — Progress Notes (Signed)
Hematology/Oncology  [place] Telephone:([phone] [phone]   Patient Care Team: [name], MD as PCP - General (Internal Medicine) Telford Nab, RN as Registered Nurse  REFERRING PROVIDER: [name], MD  REASON FOR VISIT:  Follow-up for squamous cell carcinoma  HISTORY OF PRESENTING ILLNESS:   Carlos Levine is a  78 y.o.  male with PMH listed below was seen in consultation at the request of  [name], MD  for evaluation of lung mass.  Patient reports chronic SOB for about 1 year. Cough gets worse recently, productive with clear mucus.  No exacerbating or alleviating factors.  Former smoker, quit smoking in 2012.  Used to have 30-pack-year smoking history. Patient had a chest x-ray done followed by CT chest which showed lung mass and hilar lymphadenopathy.  Refer to heme-onc for further evaluation and management.  Remote history of prostate cancer, previously follows up with Dr. [name].  Patient is on Casodex 50 mg daily. Was last seen by Dr. [name] in 2018., Malignant neoplasm of prostate ([facility]) 02/2006  Gleason's 8 in 4 of 8 biopsy specimens all from the right side. PSA 12.2  PSA 01/23/2017 is <0.01  Chronic lower back pain and radiculopathy. Lives at home with wife, daughter and granddaughter  CT-guided biopsy on 04/13/2019. Pathology showed squamous cell lung cancer.   INTERVAL HISTORY GRAVIEL Levine is a 78 y.o. male who has above history reviewed by me today presents for follow up visit for management of squamous cell lung cancer #There was plan for bronchoscopy/EBUS assisted lymph node biopsy  procedure was canceled as patient was found to be in SVT.  Patient was sent to emergency room for further evaluation. Work-up included CTA that was negative for PE.  Echocardiogram EF 55 to 60%. 05/26/2019 CT angiogram PE protocol showed a spiculated mass right posterior apical segment unchanged. Discussed with pulmonology,  little mediastinum hilar lymph adenopathy was seen. Patient present to discuss further steps. Continue to have productive cough, shortness of breath with exertion. Chronic back pain unchanged.   Review of Systems  Constitutional: Positive for fatigue. Negative for appetite change, chills, fever and unexpected weight change.  HENT:   Negative for hearing loss and voice change.   Eyes: Negative for eye problems and icterus.  Respiratory: Positive for cough and shortness of breath. Negative for chest tightness.   Cardiovascular: Negative for chest pain and leg swelling.  Gastrointestinal: Negative for abdominal distention and abdominal pain.  Endocrine: Negative for hot flashes.  Genitourinary: Negative for difficulty urinating, dysuria and frequency.   Musculoskeletal: Positive for back pain. Negative for arthralgias.  Skin: Negative for itching and rash.  Neurological: Negative for light-headedness and numbness.  Hematological: Negative for adenopathy. Does not bruise/bleed easily.  Psychiatric/Behavioral: Negative for confusion.    MEDICAL HISTORY:  Past Medical History:  Diagnosis Date   Arthritis    Cancer ([place])    prostate   Chronic kidney disease    Diabetes mellitus without complication ([place])    History of kidney stones    Hyperlipidemia    Prostate cancer ([place])     SURGICAL HISTORY: Past Surgical History:  Procedure Laterality Date   AMPUTATION FINGER / THUMB Right    PROSTATE CRYOABLATION     SPINAL FUSION     VIDEO BRONCHOSCOPY WITH ENDOBRONCHIAL NAVIGATION N/A 05/30/2019   Procedure: VIDEO BRONCHOSCOPY WITH ENDOBRONCHIAL NAVIGATION, DIABETIC;  Surgeon: Ottie Glazier, MD;  Location: [facility] ORS;  Service: Thoracic;  Laterality: N/A;   VIDEO BRONCHOSCOPY WITH ENDOBRONCHIAL ULTRASOUND N/A 05/30/2019   Procedure: VIDEO BRONCHOSCOPY WITH ENDOBRONCHIAL ULTRASOUND, DIABETIC;  Surgeon: Ottie Glazier, MD;  Location: [facility] ORS;  Service: Thoracic;  Laterality:  N/A;    SOCIAL HISTORY: Social History   Socioeconomic History   Marital status: Married    Spouse name: [name]   Number of children: 4   Years of education: Not on file   Highest education level: Not on file  Occupational History   Occupation: retired  Scientist, product/process development strain: Not hard at [employer] insecurity    Worry: Never true    Inability: Never true   Transportation needs    Medical: No    Non-medical: No  Tobacco Use   Smoking status: Former Smoker    Years: 30.00    Types: Cigarettes    Quit date: 10/20/2009    Years since quitting: 9.6   Smokeless tobacco: Never Used  Substance and Sexual Activity   Alcohol use: No   Drug use: No   Sexual activity: Yes    Birth control/protection: None  Lifestyle   Physical activity    Days per week: 0 days    Minutes per session: Not on file   Stress: To some extent  Relationships   Social connections    Talks on phone: More than three times a week    Gets together: Not on file    Attends religious service: Never    Active member of club or organization: No    Attends meetings of clubs or organizations: Never    Relationship status: Married   Intimate partner violence    Fear of current or ex partner: No    Emotionally abused: No    Physically abused: No    Forced sexual activity: No  Other Topics Concern   Not on file  Social History Narrative   Not on file    FAMILY HISTORY: Family History  Problem Relation Age of Onset   Lung cancer Father    Pancreatic cancer Father    Brain cancer Mother    Stomach cancer Paternal Aunt    Stomach cancer Paternal Uncle    Cancer Maternal Grandmother    Kidney cancer Maternal Grandfather     ALLERGIES:  is allergic to contrast media [iodinated diagnostic agents] and ace inhibitors.  MEDICATIONS:  Current Outpatient Medications  Medication Sig Dispense Refill   bicalutamide (CASODEX) 50 MG tablet Take 50 mg by mouth daily  at 12 noon.     diltiazem (CARDIZEM CD) 120 MG 24 hr capsule Take 1 capsule (120 mg total) by mouth daily. 30 capsule 1   pravastatin (PRAVACHOL) 80 MG tablet Take 80 mg by mouth at bedtime.      No current facility-administered medications for this visit.      PHYSICAL EXAMINATION: ECOG PERFORMANCE STATUS: 2 - Symptomatic, <50% confined to bed Vitals:   06/01/19 1429  BP: (!) 146/74  Pulse: 66  Resp: 20  Temp: (!) 97.5 F (36.4 C)  SpO2: 97%   Filed Weights   06/01/19 1429  Weight: 225 lb 1.6 oz (102.1 kg)    Physical Exam Constitutional:      General: He is not in acute distress.    Appearance: He is obese.  HENT:     Head: Normocephalic and atraumatic.  Eyes:     General: No scleral icterus.    Pupils: Pupils are equal, round, and reactive to light.  Neck:     Musculoskeletal: Normal range of motion and neck supple.  Cardiovascular:     Rate and Rhythm: Normal rate and regular rhythm.     Heart sounds: Normal heart sounds.  Pulmonary:     Effort: Pulmonary effort is normal. No respiratory distress.     Breath sounds: No wheezing.     Comments: Decreased breath sound bilaterally. Abdominal:     General: Bowel sounds are normal. There is no distension.     Palpations: Abdomen is soft. There is no mass.     Tenderness: There is no abdominal tenderness.  Musculoskeletal: Normal range of motion.        General: No deformity.  Skin:    General: Skin is warm and dry.     Findings: No erythema or rash.  Neurological:     Mental Status: He is alert and oriented to person, place, and time.     Cranial Nerves: No cranial nerve deficit.     Coordination: Coordination normal.  Psychiatric:        Behavior: Behavior normal.        Thought Content: Thought content normal.   Bilateral gynecomastia  LABORATORY DATA:  I have reviewed the data as listed Lab Results  Component Value Date   WBC 9.7 05/30/2019   HGB 13.6 05/30/2019   HCT 41.7 05/30/2019   MCV 90.5  05/30/2019   PLT 227 05/30/2019   Recent Labs    03/29/19 1603 05/26/19 0847 05/27/19 0437 05/30/19 1236  NA 138 138 141 140  K 4.6 4.6 4.1 4.5  CL 109 108 110 110  CO2 19* 19* 20* 19*  GLUCOSE 137* 279* 152* 158*  BUN 23 28* 23 20  CREATININE 1.83* 1.87* 1.50* 1.54*  CALCIUM 8.8* 9.0 8.7* 9.1  GFRNONAA 34* 33* 44* 42*  GFRAA 40* 39* 51* 49*  PROT 7.5 8.1  --  7.7  ALBUMIN 3.8 3.9  --  3.8  AST 17 17  --  17  ALT 12 10  --  11  ALKPHOS 42 48  --  48  BILITOT 1.0 0.9  --  0.8   Iron/TIBC/Ferritin/ %Sat No results found for: IRON, TIBC, FERRITIN, IRONPCTSAT    RADIOGRAPHIC STUDIES: I have personally reviewed the radiological images as listed and agreed with the findings in the report.  Ct Chest Wo Contrast  Result Date: 05/24/2019 CLINICAL DATA:  Shortness of breath, pulmonary mass EXAM: CT CHEST WITHOUT CONTRAST TECHNIQUE: Multidetector CT imaging of the chest was performed following the standard protocol without IV contrast. COMPARISON:  CT-guided biopsy, 04/13/2019, CT chest, 03/24/2019, PET-CT, 04/04/2019 FINDINGS: Cardiovascular: Aortic atherosclerosis. Normal heart size. Extensive 3 vessel coronary artery calcifications. No pericardial effusion. Mediastinum/Nodes: No enlarged mediastinal, hilar, or axillary lymph nodes. Thyroid gland, trachea, and esophagus demonstrate no significant findings. Lungs/Pleura: Moderate centrilobular and paraseptal emphysema. There is a redemonstrated spiculated mass of the posterior right upper lobe with adjacent pleural thickening and/or retraction, which is increased in size when compared to prior examination dated 03/24/2019, now measuring 4.2 x 3.8 cm, previously 3.4 x 2.9 cm when measured similarly. Cavitary component is no longer appreciated, possibly due to fluid filling in the interim. No pleural effusion or pneumothorax. Upper Abdomen: No acute abnormality.  Cholelithiasis Musculoskeletal: No chest wall mass or suspicious bone lesions  identified. IMPRESSION: 1. There is a redemonstrated spiculated mass of the posterior right upper lobe with adjacent pleural thickening and/or retraction, which is increased in size when compared to prior  examination dated 03/24/2019, now measuring 4.2 x 3.8 cm, previously 3.4 x 2.9 cm when measured similarly. Cavitary component is no longer appreciated, possibly due to fluid filling in the interim. No evidence of lymphadenopathy or metastatic disease in the chest. 2.  Emphysema. 3.  Coronary artery disease and aortic atherosclerosis. Electronically Signed   By: Eddie Candle M.D.   On: 05/24/2019 16:49   Ct Angio Chest Pe W And/or Wo Contrast  Result Date: 05/26/2019 CLINICAL DATA:  Short of breath tachycardia. History of prostate cancer. Lung mass. EXAM: CT ANGIOGRAPHY CHEST WITH CONTRAST TECHNIQUE: Multidetector CT imaging of the chest was performed using the standard protocol during bolus administration of intravenous contrast. Multiplanar CT image reconstructions and MIPs were obtained to evaluate the vascular anatomy. CONTRAST:  38mL OMNIPAQUE IOHEXOL 350 MG/ML SOLN COMPARISON:  CT chest 05/24/2019 FINDINGS: Cardiovascular: Negative for pulmonary embolism. Pulmonary arteries normal in caliber with normal enhancement Atherosclerotic aorta without aneurysm. Extensive coronary calcification. Heart size normal without pericardial effusion. Mediastinum/Nodes: Negative for mass or adenopathy. Lungs/Pleura: Spiculated mass right posterior apical segment measuring approximately 4.2 by 3.7 cm. Small area of central cavitation. Lung mass abuts the pleural surface and is similar in size to the recent study. No pleural effusion. No other lung nodules. Negative for pneumonia. Apically emphysema bilaterally. Upper Abdomen: Gallstones. Musculoskeletal: No acute skeletal abnormality. Review of the MIP images confirms the above findings. IMPRESSION: 1. Negative for pulmonary embolism 2. Atherosclerotic aorta.  Extensive  coronary calcification 3. Spiculated mass right posterior apical segment unchanged. Findings compatible with carcinoma of the lung. 4. Cholelithiasis. Electronically Signed   By: Franchot Gallo M.D.   On: 05/26/2019 10:35    ASSESSMENT & PLAN:  1. Squamous cell lung cancer, right (HCC)   Cancer Staging Squamous cell lung cancer, right ([place]) Staging form: Lung, AJCC 8th Edition - Clinical stage from 04/20/2019: Stage IIA (cT2b, cN0, cM0) - Unsigned   # cT2b N0 M0 squamous cell lung cancer. CT chest without contrast on 05/24/2019 and CT angiogram PE protocol images were independently reviewed and discussed with patient.  There is little/no mediastinal or hilar lymphadenopathy.  Previous hilar lymphadenopathy probably reactive. Given patient's age, multiple comorbidities, discussed with patient that EBUS assisted lymph node biopsy may not be beneficial.  Patient has establish care with radiation oncology and was recommended to proceed with SBRT. Discussed with pulmonology Dr.Aleskerov and he is in agreement. After SBRT, will monitor patient with surveillance CT scans every 3 months. Patient agreed with the plan. He has simulation scheduled at the end of August. I will see him back in 3 months with repeat CT scan. Per patient's request, patient's son was called and updated.  Son agrees with the plan.  Goal of care was discussed. Curative intent.    Chronic gynecomastia.  Secondary to Casodex History of PSA, last PSA in careeverywhere was in 2018.    All questions were answered. The patient knows to call the clinic with any problems questions or concerns.  cc [name], MD   Orders Placed This Encounter  Procedures   CT CHEST WO CONTRAST    Standing Status:   Future    Standing Expiration Date:   05/31/2020    Order Specific Question:   ** REASON FOR EXAM (FREE TEXT)    Answer:   lung cancer    Order Specific Question:   Preferred imaging location?    Answer:   [facility]    Order Specific Question:  Radiology Contrast Protocol - do NOT remove file path    Answer:   [email or web address]   Comprehensive metabolic panel    Standing Status:   Future    Standing Expiration Date:   05/31/2020   CBC with Differential/Platelet    Standing Status:   Future    Standing Expiration Date:   05/31/2020    Return of visit:  3 months  We spent sufficient time to discuss many aspect of care, questions were answered to patient's satisfaction. Total face to face encounter time for this patient visit was 25 min. >50% of the time was  spent in counseling and coordination of care.    Earlie Server, MD, PhD Hematology Oncology [facility] at [facility] Pager- [phone] 06/02/2019

## 2019-06-03 ENCOUNTER — Other Ambulatory Visit
Admission: RE | Admit: 2019-06-03 | Discharge: 2019-06-03 | Disposition: A | Discharge status: Home / Self Care | Payer: Medicare Other | Source: Ambulatory Visit | Attending: Pulmonary Disease | Admitting: Pulmonary Disease

## 2019-06-03 ENCOUNTER — Inpatient Hospital Stay: Admission: RE | Admit: 2019-06-03 | Payer: Medicare Other | Source: Ambulatory Visit

## 2019-06-06 ENCOUNTER — Ambulatory Visit: Payer: Medicare Other

## 2019-06-06 DIAGNOSIS — C3411 Malignant neoplasm of upper lobe, right bronchus or lung: Secondary | ICD-10-CM | POA: Diagnosis not present

## 2019-06-08 ENCOUNTER — Ambulatory Visit: Admission: RE | Admit: 2019-06-08 | Payer: Medicare Other | Source: Home / Self Care

## 2019-06-08 ENCOUNTER — Encounter: Admission: RE | Payer: Self-pay | Source: Home / Self Care

## 2019-06-08 SURGERY — BRONCHOSCOPY, WITH EBUS
Anesthesia: General

## 2019-06-13 ENCOUNTER — Other Ambulatory Visit: Payer: Medicare Other

## 2019-06-14 ENCOUNTER — Encounter: Payer: Self-pay | Admitting: *Deleted

## 2019-06-14 ENCOUNTER — Other Ambulatory Visit: Payer: Self-pay

## 2019-06-14 ENCOUNTER — Ambulatory Visit
Admission: RE | Admit: 2019-06-14 | Discharge: 2019-06-14 | Disposition: A | Discharge status: Home / Self Care | Payer: Medicare Other | Source: Ambulatory Visit | Attending: Radiation Oncology | Admitting: Radiation Oncology

## 2019-06-14 DIAGNOSIS — C3411 Malignant neoplasm of upper lobe, right bronchus or lung: Secondary | ICD-10-CM | POA: Diagnosis not present

## 2019-06-14 NOTE — Progress Notes (Addendum)
  Oncology Nurse Navigator Documentation  Navigator Location: [facility] (06/14/19 1300)   )Navigator Encounter Type: Treatment (06/14/19 1300)                   Treatment Initiated Date: 06/14/19 (06/14/19 1300)   Treatment Phase: First Radiation Tx (06/14/19 1300) Barriers/Navigation Needs: No Barriers At This Time (06/14/19 1300)   Interventions: None Required (06/14/19 1300)                      Time Spent with Patient: 15 (06/14/19 1300)

## 2019-06-16 ENCOUNTER — Ambulatory Visit
Admission: RE | Admit: 2019-06-16 | Discharge: 2019-06-16 | Disposition: A | Discharge status: Home / Self Care | Payer: Medicare Other | Source: Ambulatory Visit | Attending: Radiation Oncology | Admitting: Radiation Oncology

## 2019-06-16 ENCOUNTER — Other Ambulatory Visit: Payer: Self-pay

## 2019-06-16 ENCOUNTER — Other Ambulatory Visit: Payer: Medicare Other

## 2019-06-16 DIAGNOSIS — C3411 Malignant neoplasm of upper lobe, right bronchus or lung: Secondary | ICD-10-CM | POA: Diagnosis not present

## 2019-06-21 ENCOUNTER — Ambulatory Visit
Admission: RE | Admit: 2019-06-21 | Discharge: 2019-06-21 | Disposition: A | Discharge status: Home / Self Care | Payer: Medicare Other | Source: Ambulatory Visit | Attending: Radiation Oncology | Admitting: Radiation Oncology

## 2019-06-21 ENCOUNTER — Other Ambulatory Visit: Payer: Self-pay

## 2019-06-21 DIAGNOSIS — C3411 Malignant neoplasm of upper lobe, right bronchus or lung: Secondary | ICD-10-CM | POA: Diagnosis not present

## 2019-06-21 DIAGNOSIS — Z510 Encounter for antineoplastic radiation therapy: Secondary | ICD-10-CM | POA: Insufficient documentation

## 2019-06-23 ENCOUNTER — Ambulatory Visit
Admission: RE | Admit: 2019-06-23 | Discharge: 2019-06-23 | Disposition: A | Discharge status: Home / Self Care | Payer: Medicare Other | Source: Ambulatory Visit | Attending: Radiation Oncology | Admitting: Radiation Oncology

## 2019-06-23 ENCOUNTER — Other Ambulatory Visit: Payer: Self-pay

## 2019-06-23 DIAGNOSIS — C3411 Malignant neoplasm of upper lobe, right bronchus or lung: Secondary | ICD-10-CM | POA: Diagnosis not present

## 2019-06-28 ENCOUNTER — Ambulatory Visit
Admission: RE | Admit: 2019-06-28 | Discharge: 2019-06-28 | Disposition: A | Discharge status: Home / Self Care | Payer: Medicare Other | Source: Ambulatory Visit | Attending: Radiation Oncology | Admitting: Radiation Oncology

## 2019-06-28 ENCOUNTER — Other Ambulatory Visit: Payer: Self-pay

## 2019-06-28 DIAGNOSIS — C3411 Malignant neoplasm of upper lobe, right bronchus or lung: Secondary | ICD-10-CM | POA: Diagnosis not present

## 2019-07-04 ENCOUNTER — Encounter: Payer: Self-pay | Admitting: Anesthesiology

## 2019-07-15 ENCOUNTER — Emergency Department
Admission: EM | Admit: 2019-07-15 | Discharge: 2019-07-15 | Disposition: A | Discharge status: Home / Self Care | Payer: Medicare Other | Attending: Emergency Medicine | Admitting: Emergency Medicine

## 2019-07-15 ENCOUNTER — Other Ambulatory Visit: Payer: Self-pay

## 2019-07-15 ENCOUNTER — Encounter: Payer: Self-pay | Admitting: Emergency Medicine

## 2019-07-15 DIAGNOSIS — Z87891 Personal history of nicotine dependence: Secondary | ICD-10-CM | POA: Insufficient documentation

## 2019-07-15 DIAGNOSIS — N183 Chronic kidney disease, stage 3 (moderate): Secondary | ICD-10-CM | POA: Insufficient documentation

## 2019-07-15 DIAGNOSIS — Z8546 Personal history of malignant neoplasm of prostate: Secondary | ICD-10-CM | POA: Diagnosis not present

## 2019-07-15 DIAGNOSIS — H9193 Unspecified hearing loss, bilateral: Secondary | ICD-10-CM | POA: Diagnosis present

## 2019-07-15 DIAGNOSIS — E1122 Type 2 diabetes mellitus with diabetic chronic kidney disease: Secondary | ICD-10-CM | POA: Insufficient documentation

## 2019-07-15 DIAGNOSIS — I129 Hypertensive chronic kidney disease with stage 1 through stage 4 chronic kidney disease, or unspecified chronic kidney disease: Secondary | ICD-10-CM | POA: Insufficient documentation

## 2019-07-15 DIAGNOSIS — H6123 Impacted cerumen, bilateral: Secondary | ICD-10-CM | POA: Diagnosis not present

## 2019-07-15 NOTE — ED Triage Notes (Signed)
Pt presents to ED via POV with his son, pt's son reports that pt used debrox yesterday and today, since using debrox pt has had loss of hearing. Pt's son reports placing drops, however has not flushed ears out.

## 2019-07-15 NOTE — ED Notes (Signed)
Both ears irrigated with warm water   Tolerated well

## 2019-07-15 NOTE — ED Notes (Signed)
See triage note   Presents with some hearing loss   Son states that they has placed some Debrox in his ears and tried to irrigate some   Min relief

## 2019-07-15 NOTE — ED Provider Notes (Signed)
[facility] Emergency Department Provider Note   ____________________________________________   First MD Initiated Contact with Patient 07/15/19 1440     (approximate)  I have reviewed the triage vital signs and the nursing notes.   HISTORY  Chief Complaint Hearing Loss    HPI Carlos Levine is a 78 y.o. male patient presents with hearing loss secondary to cerumen impaction.  Patient bilateral impaction was noted on physical exam yesterday.  Patient's son tried to flush the ears yesterday without success.  Patient denies ear pain or vertigo.         Past Medical History:  Diagnosis Date  . Arthritis   . Cancer [facility])    prostate  . Chronic kidney disease   . Diabetes mellitus without complication ([place])   . History of kidney stones   . Hyperlipidemia   . Prostate cancer [facility])     Patient Active Problem List   Diagnosis Date Noted  . SOB (shortness of breath) 05/26/2019  . SVT (supraventricular tachycardia) ([place]) 05/26/2019  . Malignant neoplasm of right lung ([place])   . Squamous cell lung cancer, right ([place]) 04/20/2019  . Localized enlarged lymph nodes 03/29/2019  . Solitary pulmonary nodule 03/29/2019  . Goals of care, counseling/discussion 03/29/2019  . Chronic bilateral low back pain without sciatica 02/21/2018  . Chronic constipation 11/19/2017  . Hyperkalemia 11/19/2017  . Elevated troponin 06/11/2017  . Dizziness 06/24/2016  . 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 Sore of lower lip 05/13/2015  . Benign essential hypertension 02/02/2015  . 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 Pure hypercholesterolemia 02/02/2015  . Lumbar degenerative disc disease 02/02/2015  . CKD (chronic kidney disease) stage 3, GFR 30-59 ml/min (HCC) 05/12/2014  . Malignant neoplasm of prostate ([place]) 07/14/2012  . Hypertrophy of breast 07/14/2012  . ED (erectile dysfunction) of organic origin  07/14/2012    Past Surgical History:  Procedure Laterality Date  . AMPUTATION FINGER / THUMB Right   . PROSTATE CRYOABLATION    . SPINAL FUSION    . VIDEO BRONCHOSCOPY WITH ENDOBRONCHIAL NAVIGATION N/A 05/30/2019   Procedure: VIDEO BRONCHOSCOPY WITH ENDOBRONCHIAL NAVIGATION, DIABETIC;  Surgeon: Ottie Glazier, MD;  Location: [facility] ORS;  Service: Thoracic;  Laterality: N/A;  . VIDEO BRONCHOSCOPY WITH ENDOBRONCHIAL ULTRASOUND N/A 05/30/2019   Procedure: VIDEO BRONCHOSCOPY WITH ENDOBRONCHIAL ULTRASOUND, DIABETIC;  Surgeon: Ottie Glazier, MD;  Location: [facility] ORS;  Service: Thoracic;  Laterality: N/A;    Prior to Admission medications   Medication Sig Start Date End Date Taking? Authorizing Provider  bicalutamide (CASODEX) 50 MG tablet Take 50 mg by mouth daily at 12 noon. 05/09/19   [provider]  diltiazem (CARDIZEM CD) 120 MG 24 hr capsule Take 1 capsule (120 mg total) by mouth daily. 05/30/19 05/29/20  Lilia Pro., MD  pravastatin (PRAVACHOL) 80 MG tablet Take 80 mg by mouth at bedtime.  01/02/19   [provider]    Allergies Contrast media [iodinated diagnostic agents] and Ace inhibitors  Family History  Problem Relation Age of Onset  . Lung cancer Father   . Pancreatic cancer Father   . Brain cancer Mother   . Stomach cancer Paternal Aunt   . Stomach cancer Paternal Uncle   . Cancer Maternal Grandmother   . Kidney cancer Maternal Grandfather     Social History Social History   Tobacco Use  . Smoking status: Former Smoker    Years: 30.00    Types:  Cigarettes    Quit date: 10/20/2009    Years since quitting: 9.7  . Smokeless tobacco: Never Used  Substance Use Topics  . Alcohol use: No  . Drug use: No    Review of Systems Constitutional: No fever/chills Eyes: No visual changes. ENT: No sore throat.  Hearing loss. Cardiovascular: Denies chest pain. Respiratory: Denies shortness of breath. Gastrointestinal: No abdominal pain.  No nausea, no  vomiting.  No diarrhea.  No constipation. Genitourinary: Negative for dysuria. Musculoskeletal: Negative for back pain. Skin: Negative for rash. Neurological: Negative for headaches, focal weakness or numbness. Endocrine:  Diabetes, ESRD, and hyperlipidemia.    Allergic/Immunilogical: ACE inhibitor and contrast dye.  ____________________________________________   PHYSICAL EXAM:  VITAL SIGNS: ED Triage Vitals  Enc Vitals Group     BP 07/15/19 1427 124/73     Pulse Rate 07/15/19 1427 83     Resp 07/15/19 1427 18     Temp 07/15/19 1427 97.9 F (36.6 C)     Temp Source 07/15/19 1427 Oral     SpO2 07/15/19 1427 97 %     Weight 07/15/19 1427 222 lb (100.7 kg)     Height 07/15/19 1427 5\' 10"  (1.778 m)     Head Circumference --      Peak Flow --      Pain Score 07/15/19 1436 0     Pain Loc --      Pain Edu? --      Excl. in [place]? --     Constitutional: Alert and oriented. Well appearing and in no acute distress. EARS: Bilateral cerumen impaction.. Neck: No stridor.   Cardiovascular: Normal rate, regular rhythm. Grossly normal heart sounds.  Good peripheral circulation. Respiratory: Normal respiratory effort.  No retractions. Lungs CTAB. Neurologic:  Normal speech and language. No gross focal neurologic deficits are appreciated. No gait instability. Skin:  Skin is warm, dry and intact. No rash noted. Psychiatric: Mood and affect are normal. Speech and behavior are normal.  ____________________________________________   LABS (all labs ordered are listed, but only abnormal results are displayed)  Labs Reviewed - No data to display ____________________________________________  EKG   ____________________________________________  RADIOLOGY  ED MD interpretation:    Official radiology report(s): No results found.  ____________________________________________   PROCEDURES  Procedure(s) performed (including Critical Care):  .Ear Cerumen Removal  Date/Time:  07/15/2019 3:00 PM Performed by: McLamb, Berniece Salines, RN Authorized by: Sable Feil, PA-C   Consent:    Consent obtained:  Verbal   Consent given by:  Patient   Risks discussed:  Bleeding, pain, infection, incomplete removal and dizziness Procedure details:    Location:  L ear and R ear   Procedure type: irrigation   Post-procedure details:    Inspection:  TM intact   Hearing quality:  Improved   Patient tolerance of procedure:  Tolerated well, no immediate complications     ____________________________________________   INITIAL IMPRESSION / ASSESSMENT AND PLAN / ED COURSE  As part of my medical decision making, I reviewed the following data within the [place]         Patient presents with hair loss secondary to cerumen impaction.  Moderate amounts removed bilateral ears.  Residual material visible distal bilateral canals.  Patient given discharge care instructions to continue using Debrox and return in 2 to 3 days if needed for re-irrigation.      ____________________________________________   FINAL CLINICAL IMPRESSION(S) / ED DIAGNOSES  Final diagnoses:  Hearing loss of  both ears due to cerumen impaction     ED Discharge Orders    None       Note:  This document was prepared using Dragon voice recognition software and may include unintentional dictation errors.    Sable Feil, PA-C 07/15/19 1514    Nena Polio, MD 07/15/19 [phone]

## 2019-07-17 ENCOUNTER — Emergency Department
Admission: EM | Admit: 2019-07-17 | Discharge: 2019-07-17 | Disposition: A | Discharge status: Home / Self Care | Payer: Medicare Other | Attending: Emergency Medicine | Admitting: Emergency Medicine

## 2019-07-17 ENCOUNTER — Other Ambulatory Visit: Payer: Self-pay

## 2019-07-17 DIAGNOSIS — Z87891 Personal history of nicotine dependence: Secondary | ICD-10-CM | POA: Insufficient documentation

## 2019-07-17 DIAGNOSIS — Z8546 Personal history of malignant neoplasm of prostate: Secondary | ICD-10-CM | POA: Insufficient documentation

## 2019-07-17 DIAGNOSIS — N183 Chronic kidney disease, stage 3 (moderate): Secondary | ICD-10-CM | POA: Insufficient documentation

## 2019-07-17 DIAGNOSIS — H6123 Impacted cerumen, bilateral: Secondary | ICD-10-CM

## 2019-07-17 DIAGNOSIS — Z85118 Personal history of other malignant neoplasm of bronchus and lung: Secondary | ICD-10-CM | POA: Diagnosis not present

## 2019-07-17 DIAGNOSIS — E1122 Type 2 diabetes mellitus with diabetic chronic kidney disease: Secondary | ICD-10-CM | POA: Diagnosis not present

## 2019-07-17 DIAGNOSIS — Z79899 Other long term (current) drug therapy: Secondary | ICD-10-CM | POA: Diagnosis not present

## 2019-07-17 DIAGNOSIS — Z89011 Acquired absence of right thumb: Secondary | ICD-10-CM | POA: Insufficient documentation

## 2019-07-17 DIAGNOSIS — I129 Hypertensive chronic kidney disease with stage 1 through stage 4 chronic kidney disease, or unspecified chronic kidney disease: Secondary | ICD-10-CM | POA: Diagnosis not present

## 2019-07-17 NOTE — ED Provider Notes (Signed)
[facility] EMERGENCY DEPARTMENT Provider Note   CSN: [id number] Arrival date & time: 07/17/19  1041     History   Chief Complaint Chief Complaint  Patient presents with  . Cerumen Impaction    HPI Carlos Levine is a 78 y.o. male presents to the emergency department for evaluation of bilateral ear cerumen impaction.  Patient states was seen recently, told to use Debrox and irrigation, after couple uses of this he developed complete hearing loss into both ears.  Patient states he is unable to hear out of his right or left ear as of this morning.  He denies any other deficits, headache, vision changes.  No chest pain, shortness of breath or rashes.  He feels well except for being unable to hear.     HPI  Past Medical History:  Diagnosis Date  . Arthritis   . Cancer [facility])    prostate  . Chronic kidney disease   . Diabetes mellitus without complication ([place])   . History of kidney stones   . Hyperlipidemia   . Prostate cancer [facility])     Patient Active Problem List   Diagnosis Date Noted  . SOB (shortness of breath) 05/26/2019  . SVT (supraventricular tachycardia) ([place]) 05/26/2019  . Malignant neoplasm of right lung ([place])   . Squamous cell lung cancer, right ([place]) 04/20/2019  . Localized enlarged lymph nodes 03/29/2019  . Solitary pulmonary nodule 03/29/2019  . Goals of care, counseling/discussion 03/29/2019  . Chronic bilateral low back pain without sciatica 02/21/2018  . Chronic constipation 11/19/2017  . Hyperkalemia 11/19/2017  . Elevated troponin 06/11/2017  . Dizziness 06/24/2016  . 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 Sore of lower lip 05/13/2015  . Benign essential hypertension 02/02/2015  . Type 2 diabetes mellitus with diabetic chronic kidney disease ([place]) 02/02/2015  . Pure hypercholesterolemia 02/02/2015  . Lumbar degenerative disc disease 02/02/2015  . CKD (chronic kidney disease) stage 3, GFR 30-59 ml/min (HCC)  05/12/2014  . Malignant neoplasm of prostate ([place]) 07/14/2012  . Hypertrophy of breast 07/14/2012  . ED (erectile dysfunction) of organic origin 07/14/2012    Past Surgical History:  Procedure Laterality Date  . AMPUTATION FINGER / THUMB Right   . PROSTATE CRYOABLATION    . SPINAL FUSION    . VIDEO BRONCHOSCOPY WITH ENDOBRONCHIAL NAVIGATION N/A 05/30/2019   Procedure: VIDEO BRONCHOSCOPY WITH ENDOBRONCHIAL NAVIGATION, DIABETIC;  Surgeon: Ottie Glazier, MD;  Location: [facility] ORS;  Service: Thoracic;  Laterality: N/A;  . VIDEO BRONCHOSCOPY WITH ENDOBRONCHIAL ULTRASOUND N/A 05/30/2019   Procedure: VIDEO BRONCHOSCOPY WITH ENDOBRONCHIAL ULTRASOUND, DIABETIC;  Surgeon: Ottie Glazier, MD;  Location: [facility] ORS;  Service: Thoracic;  Laterality: N/A;        Home Medications    Prior to Admission medications   Medication Sig Start Date End Date Taking? Authorizing Provider  bicalutamide (CASODEX) 50 MG tablet Take 50 mg by mouth daily at 12 noon. 05/09/19   [provider]  diltiazem (CARDIZEM CD) 120 MG 24 hr capsule Take 1 capsule (120 mg total) by mouth daily. 05/30/19 05/29/20  Lilia Pro., MD  pravastatin (PRAVACHOL) 80 MG tablet Take 80 mg by mouth at bedtime.  01/02/19   [provider]    Family History Family History  Problem Relation Age of Onset  . Lung cancer Father   . Pancreatic cancer Father   . Brain cancer Mother   . Stomach cancer Paternal Aunt   . Stomach cancer Paternal Uncle   .  Cancer Maternal Grandmother   . Kidney cancer Maternal Grandfather     Social History Social History   Tobacco Use  . Smoking status: Former Smoker    Years: 30.00    Types: Cigarettes    Quit date: 10/20/2009    Years since quitting: 9.7  . Smokeless tobacco: Never Used  Substance Use Topics  . Alcohol use: No  . Drug use: No     Allergies   Contrast media [iodinated diagnostic agents] and Ace inhibitors   Review of Systems Review of Systems   Constitutional: Negative for fever.  HENT: Positive for hearing loss. Negative for ear discharge and ear pain.   Respiratory: Negative for shortness of breath.   Cardiovascular: Negative for chest pain.  Gastrointestinal: Negative for nausea and vomiting.  Neurological: Negative for dizziness, facial asymmetry, speech difficulty, weakness, numbness and headaches.     Physical Exam Updated Vital Signs BP 128/82 (BP Location: Left Arm)   Pulse 91   Temp 98 F (36.7 C) (Oral)   Resp 18   SpO2 96%   Physical Exam Constitutional:      Appearance: He is well-developed.  HENT:     Head: Normocephalic and atraumatic.     Comments: Severe bilateral cerumen impaction with hard wax bilaterally.    Right Ear: External ear normal. There is impacted cerumen.     Left Ear: External ear normal. There is impacted cerumen.  Eyes:     Conjunctiva/sclera: Conjunctivae normal.  Neck:     Musculoskeletal: Normal range of motion.  Cardiovascular:     Rate and Rhythm: Normal rate.  Pulmonary:     Effort: Pulmonary effort is normal. No respiratory distress.  Musculoskeletal: Normal range of motion.  Skin:    General: Skin is warm.     Findings: No rash.  Neurological:     Mental Status: He is alert and oriented to person, place, and time.  Psychiatric:        Behavior: Behavior normal.        Thought Content: Thought content normal.      ED Treatments / Results  Labs (all labs ordered are listed, but only abnormal results are displayed) Labs Reviewed - No data to display  EKG None  Radiology No results found.  Procedures .Ear Cerumen Removal  Date/Time: 07/17/2019 11:36 AM Performed by: Duanne Guess, PA-C Authorized by: Duanne Guess, PA-C   Consent:    Consent obtained:  Verbal   Consent given by:  Patient   Risks discussed:  Bleeding, infection, incomplete removal and TM perforation   Alternatives discussed:  No treatment Procedure details:    Location:  L ear  and R ear   Procedure type: curette   Post-procedure details:    Inspection:  TM intact   Hearing quality:  Improved   Patient tolerance of procedure:  Tolerated well, no immediate complications Comments:     Patient with 80% improvement of hearing in both ears after cerumen disimpaction with curette.  Large amount of cerumen removed from both ears.  Patient still with residual hard cerumen in both ears that was very deep, did not feel comfortable attempting to remove the remainder of the cerumen.  Due to significant improvement in hearing in both the ears I recommend he follow-up with ENT.   (including critical care time)  Medications Ordered in ED Medications - No data to display   Initial Impression / Assessment and Plan / ED Course  I have reviewed the  triage vital signs and the nursing notes.  Pertinent labs & imaging results that were available during my care of the patient were reviewed by me and considered in my medical decision making (see chart for details).        78 year old male with bilateral cerumen impaction.  No other symptoms or neurological deficits.  Physical exam is normal except for decreased hearing.  Hearing 80% better in both ears after cerumen impaction with curette.  He is encouraged to follow-up with ENT as he still has residual hard cerumen deep against the TM bilaterally.  Final Clinical Impressions(s) / ED Diagnoses   Final diagnoses:  Bilateral hearing loss due to cerumen impaction    ED Discharge Orders    None       Renata Caprice 07/17/19 1138    Vanessa [place], MD 07/17/19 [phone]

## 2019-07-17 NOTE — ED Triage Notes (Signed)
Pt states came back to ED as he continues to have trouble hearing bilaterally. Stated can barely hear in R ear and cannot at all in L. States was in ED within last few days for this. Alert/calm.

## 2019-07-17 NOTE — Discharge Instructions (Addendum)
Continue with topical eardrops to soften earwax.  Call ENT office tomorrow morning to schedule follow-up appointment for cerumen removal.

## 2019-07-17 NOTE — ED Notes (Signed)
Pt continues to c/o inability to hear in L ear and dec hearing in R ear. Pt alert and calm. Pt denies major pain in ears. Has earwax built up in both ears. Provider at bedside. Recommending consult/continuation of care with ENT.

## 2019-07-17 NOTE — ED Notes (Signed)
First Nurse Note: Pt to ED for decreased hearing in both ears. Pt is in NAD at this time.

## 2019-08-03 ENCOUNTER — Ambulatory Visit
Admission: RE | Admit: 2019-08-03 | Discharge: 2019-08-03 | Disposition: A | Discharge status: Home / Self Care | Payer: Medicare Other | Source: Ambulatory Visit | Attending: Radiation Oncology | Admitting: Radiation Oncology

## 2019-08-03 ENCOUNTER — Other Ambulatory Visit: Payer: Self-pay

## 2019-08-03 ENCOUNTER — Encounter: Payer: Self-pay | Admitting: Radiation Oncology

## 2019-08-03 VITALS — BP 128/86 | HR 115 | Temp 95.6°F | Wt 219.1 lb

## 2019-08-03 DIAGNOSIS — C3491 Malignant neoplasm of unspecified part of right bronchus or lung: Secondary | ICD-10-CM

## 2019-08-03 DIAGNOSIS — Z87891 Personal history of nicotine dependence: Secondary | ICD-10-CM | POA: Diagnosis not present

## 2019-08-03 DIAGNOSIS — C3411 Malignant neoplasm of upper lobe, right bronchus or lung: Secondary | ICD-10-CM | POA: Diagnosis present

## 2019-08-03 NOTE — Progress Notes (Signed)
Radiation Oncology Follow up Note  Name: Carlos Levine   Date:   08/03/2019 MRN:  [id number] DOB: 1941/08/16    This 78 y.o. male presents to the clinic today for 1 month follow-up status post SBRT to his right lung for stage I squamous cell carcinoma.  REFERRING PROVIDER: [name], MD  HPI: Patient is an 78 year old male with significant COPD now at 1 month having completed SBRT to his right lung for stage I squamous cell carcinoma.  Seen today in follow-up he is doing fairly well he states his breathing is unchanged maybe slightly improved he has a nonproductive cough.  Specifically denies chest tightness or dysphasia or hemoptysis..  COMPLICATIONS OF TREATMENT: none  FOLLOW UP COMPLIANCE: keeps appointments   PHYSICAL EXAM:  BP 128/86   Pulse (!) 115   Temp (!) 95.6 F (35.3 C)   Wt 219 lb 1.6 oz (99.4 kg)   BMI 31.44 kg/m  Well-developed well-nourished patient in NAD. HEENT reveals PERLA, EOMI, discs not visualized.  Oral cavity is clear. No oral mucosal lesions are identified. Neck is clear without evidence of cervical or supraclavicular adenopathy. Lungs are clear to A&P. Cardiac examination is essentially unremarkable with regular rate and rhythm without murmur rub or thrill. Abdomen is benign with no organomegaly or masses noted. Motor sensory and DTR levels are equal and symmetric in the upper and lower extremities. Cranial nerves II through XII are grossly intact. Proprioception is intact. No peripheral adenopathy or edema is identified. No motor or sensory levels are noted. Crude visual fields are within normal range.  RADIOLOGY RESULTS: No current films to review CT scan of the chest ordered  PLAN: Present time patient is doing well with no significant side effects from his recent SBRT.  I have asked to see him back in 3 months with a CT scan with contrast of his chest prior to that visit.  Patient knows to call the meantime with any concerns.  I would like to  take this opportunity to thank you for allowing me to participate in the care of your patient.Noreene Filbert, MD

## 2019-08-26 ENCOUNTER — Ambulatory Visit
Admission: RE | Admit: 2019-08-26 | Discharge: 2019-08-26 | Disposition: A | Discharge status: Home / Self Care | Payer: Medicare Other | Source: Ambulatory Visit | Attending: Oncology | Admitting: Oncology

## 2019-08-26 ENCOUNTER — Other Ambulatory Visit: Payer: Self-pay

## 2019-08-26 DIAGNOSIS — C3491 Malignant neoplasm of unspecified part of right bronchus or lung: Secondary | ICD-10-CM | POA: Diagnosis present

## 2019-08-30 ENCOUNTER — Inpatient Hospital Stay: Payer: Medicare Other | Attending: Oncology

## 2019-08-30 ENCOUNTER — Other Ambulatory Visit: Payer: Self-pay

## 2019-08-30 ENCOUNTER — Inpatient Hospital Stay (HOSPITAL_BASED_OUTPATIENT_CLINIC_OR_DEPARTMENT_OTHER): Payer: Medicare Other | Admitting: Oncology

## 2019-08-30 ENCOUNTER — Encounter: Payer: Self-pay | Admitting: Oncology

## 2019-08-30 VITALS — BP 123/80 | HR 82 | Temp 98.4°F | Resp 20 | Wt 218.9 lb

## 2019-08-30 DIAGNOSIS — Z801 Family history of malignant neoplasm of trachea, bronchus and lung: Secondary | ICD-10-CM | POA: Insufficient documentation

## 2019-08-30 DIAGNOSIS — Z8546 Personal history of malignant neoplasm of prostate: Secondary | ICD-10-CM | POA: Diagnosis not present

## 2019-08-30 DIAGNOSIS — C61 Malignant neoplasm of prostate: Secondary | ICD-10-CM | POA: Insufficient documentation

## 2019-08-30 DIAGNOSIS — N62 Hypertrophy of breast: Secondary | ICD-10-CM

## 2019-08-30 DIAGNOSIS — Z8051 Family history of malignant neoplasm of kidney: Secondary | ICD-10-CM | POA: Diagnosis not present

## 2019-08-30 DIAGNOSIS — E119 Type 2 diabetes mellitus without complications: Secondary | ICD-10-CM | POA: Diagnosis not present

## 2019-08-30 DIAGNOSIS — C3411 Malignant neoplasm of upper lobe, right bronchus or lung: Secondary | ICD-10-CM | POA: Diagnosis present

## 2019-08-30 DIAGNOSIS — N1832 Chronic kidney disease, stage 3b: Secondary | ICD-10-CM | POA: Insufficient documentation

## 2019-08-30 DIAGNOSIS — M545 Low back pain: Secondary | ICD-10-CM | POA: Diagnosis not present

## 2019-08-30 DIAGNOSIS — Z87891 Personal history of nicotine dependence: Secondary | ICD-10-CM | POA: Insufficient documentation

## 2019-08-30 DIAGNOSIS — G8929 Other chronic pain: Secondary | ICD-10-CM | POA: Insufficient documentation

## 2019-08-30 DIAGNOSIS — C3491 Malignant neoplasm of unspecified part of right bronchus or lung: Secondary | ICD-10-CM | POA: Diagnosis not present

## 2019-08-30 DIAGNOSIS — Z7984 Long term (current) use of oral hypoglycemic drugs: Secondary | ICD-10-CM | POA: Insufficient documentation

## 2019-08-30 DIAGNOSIS — E785 Hyperlipidemia, unspecified: Secondary | ICD-10-CM | POA: Diagnosis not present

## 2019-08-30 DIAGNOSIS — Z800 Family history of malignant neoplasm of digestive organs: Secondary | ICD-10-CM | POA: Diagnosis not present

## 2019-08-30 DIAGNOSIS — Z79899 Other long term (current) drug therapy: Secondary | ICD-10-CM | POA: Diagnosis not present

## 2019-08-30 LAB — CBC WITH DIFFERENTIAL/PLATELET
Abs Immature Granulocytes: 0.04 10*3/uL (ref 0.00–0.07)
Basophils Absolute: 0.1 10*3/uL (ref 0.0–0.1)
Basophils Relative: 1 %
Eosinophils Absolute: 0.1 10*3/uL (ref 0.0–0.5)
Eosinophils Relative: 2 %
HCT: 41.1 % (ref 39.0–52.0)
Hemoglobin: 13.2 g/dL (ref 13.0–17.0)
Immature Granulocytes: 1 %
Lymphocytes Relative: 31 %
Lymphs Abs: 2.4 10*3/uL (ref 0.7–4.0)
MCH: 29.7 pg (ref 26.0–34.0)
MCHC: 32.1 g/dL (ref 30.0–36.0)
MCV: 92.4 fL (ref 80.0–100.0)
Monocytes Absolute: 0.8 10*3/uL (ref 0.1–1.0)
Monocytes Relative: 11 %
Neutro Abs: 4.3 10*3/uL (ref 1.7–7.7)
Neutrophils Relative %: 54 %
Platelets: 270 10*3/uL (ref 150–400)
RBC: 4.45 MIL/uL (ref 4.22–5.81)
RDW: 13.1 % (ref 11.5–15.5)
WBC: 7.7 10*3/uL (ref 4.0–10.5)
nRBC: 0 % (ref 0.0–0.2)

## 2019-08-30 LAB — COMPREHENSIVE METABOLIC PANEL
ALT: 13 U/L (ref 0–44)
AST: 21 U/L (ref 15–41)
Albumin: 3.6 g/dL (ref 3.5–5.0)
Alkaline Phosphatase: 53 U/L (ref 38–126)
Anion gap: 8 (ref 5–15)
BUN: 24 mg/dL — ABNORMAL HIGH (ref 8–23)
CO2: 22 mmol/L (ref 22–32)
Calcium: 8.7 mg/dL — ABNORMAL LOW (ref 8.9–10.3)
Chloride: 105 mmol/L (ref 98–111)
Creatinine, Ser: 1.74 mg/dL — ABNORMAL HIGH (ref 0.61–1.24)
GFR calc Af Amer: 42 mL/min — ABNORMAL LOW (ref >60)
GFR calc non Af Amer: 36 mL/min — ABNORMAL LOW (ref >60)
Glucose, Bld: 168 mg/dL — ABNORMAL HIGH (ref 70–99)
Potassium: 4.5 mmol/L (ref 3.5–5.1)
Sodium: 135 mmol/L (ref 135–145)
Total Bilirubin: 0.8 mg/dL (ref 0.3–1.2)
Total Protein: 8.3 g/dL — ABNORMAL HIGH (ref 6.5–8.1)

## 2019-08-30 NOTE — Progress Notes (Signed)
Patient here for follow up. No concerns voiced.  °

## 2019-08-30 NOTE — Progress Notes (Signed)
Hematology/Oncology  [place] Tele[facility]:([phone] [phone]   Patient Care Team: [name], MD as PCP - General (Internal Medicine) Telford Nab, RN as Registered Nurse  REFERRING PROVIDER: [name], MD  REASON FOR VISIT:  Follow-up for squamous cell carcinoma  HISTORY OF PRESENTING ILLNESS:   Carlos Levine is a  78 y.o.  male with PMH listed below was seen in consultation at the request of  [name], MD  for evaluation of lung mass.  Patient reports chronic SOB for about 1 year. Cough gets worse recently, productive with clear mucus.  No exacerbating or alleviating factors.  Former smoker, quit smoking in 2012.  Used to have 30-pack-year smoking history. Patient had a chest x-ray done followed by CT chest which showed lung mass and hilar lymphadenopathy.  Refer to heme-onc for further evaluation and management.  Remote history of prostate cancer, previously follows up with Dr. [name].  Patient is on Casodex 50 mg daily. Was last seen by Dr. [name] in 2018., Malignant neoplasm of prostate ([facility]) 02/2006  Gleason's 8 in 4 of 8 biopsy specimens all from the right side. PSA 12.2  PSA 01/23/2017 is <0.01  Chronic lower back pain and radiculopathy. Lives at home with wife, daughter and granddaughter  CT-guided biopsy on 04/13/2019. Pathology showed squamous cell lung cancer.  #There was plan for bronchoscopy/EBUS assisted lymph node biopsy  procedure was canceled as patient was found to be in SVT.  Patient was sent to emergency room for further evaluation. Work-up included CTA that was negative for PE.  Echocardiogram EF 55 to 60%. 05/26/2019 CT angiogram PE protocol showed a spiculated mass right posterior apical segment unchanged. Discussed with pulmonology, little mediastinum hilar lymph adenopathy was seen. Previous hilar lymphadenopathy probably reactive. Given patient's age, multiple comorbidities, discussed with  patient that EBUS assisted lymph node biopsy may not be beneficial.  Patient has establish care with radiation oncology and was recommended to proceed with SBRT. Discussed with pulmonology Dr.Aleskerov and he is in agreement.    INTERVAL HISTORY JOHNTAY Levine is a 78 y.o. male who has above history reviewed by me today presents for follow up visit for management of squamous cell lung cancer Patient status post SBRT of right lobe mass. He has had interval surveillance CT done present to discuss results He reports cough has significantly improved, he always have some mucus in the morning. Shortness of breath with exertion, chronic, exacerbated with movement.  Not worse. Chronic back pain unchanged.  Review of Systems  Constitutional: Positive for fatigue. Negative for appetite change, chills, fever and unexpected weight change.  HENT:   Negative for hearing loss and voice change.   Eyes: Negative for eye problems and icterus.  Respiratory: Positive for cough and shortness of breath. Negative for chest tightness.   Cardiovascular: Negative for chest pain and leg swelling.  Gastrointestinal: Negative for abdominal distention and abdominal pain.  Endocrine: Negative for hot flashes.  Genitourinary: Negative for difficulty urinating, dysuria and frequency.   Musculoskeletal: Positive for back pain. Negative for arthralgias.  Skin: Negative for itching and rash.  Neurological: Negative for light-headedness and numbness.  Hematological: Negative for adenopathy. Does not bruise/bleed easily.  Psychiatric/Behavioral: Negative for confusion.    MEDICAL HISTORY:  Past Medical History:  Diagnosis Date   Arthritis    Cancer [facility])    prostate   Chronic kidney disease    Diabetes mellitus without complication ([place])    History  of kidney stones    Hyperlipidemia    Prostate cancer ([place])     SURGICAL HISTORY: Past Surgical History:  Procedure Laterality Date   AMPUTATION FINGER /  THUMB Right    PROSTATE CRYOABLATION     SPINAL FUSION     VIDEO BRONCHOSCOPY WITH ENDOBRONCHIAL NAVIGATION N/A 05/30/2019   Procedure: VIDEO BRONCHOSCOPY WITH ENDOBRONCHIAL NAVIGATION, DIABETIC;  Surgeon: Ottie Glazier, MD;  Location: [facility] ORS;  Service: Thoracic;  Laterality: N/A;   VIDEO BRONCHOSCOPY WITH ENDOBRONCHIAL ULTRASOUND N/A 05/30/2019   Procedure: VIDEO BRONCHOSCOPY WITH ENDOBRONCHIAL ULTRASOUND, DIABETIC;  Surgeon: Ottie Glazier, MD;  Location: [facility] ORS;  Service: Thoracic;  Laterality: N/A;    SOCIAL HISTORY: Social History   Socioeconomic History   Marital status: Married    Spouse name: [name]   Number of children: 4   Years of education: Not on file   Highest education level: Not on file  Occupational History   Occupation: retired  Scientist, product/process development strain: Not hard at all   Food insecurity    Worry: Never true    Inability: Never true   Transportation needs    Medical: No    Non-medical: No  Tobacco Use   Smoking status: Former Smoker    Years: 30.00    Types: Cigarettes    Quit date: 10/20/2009    Years since quitting: 9.8   Smokeless tobacco: Never Used  Substance and Sexual Activity   Alcohol use: No   Drug use: No   Sexual activity: Yes    Birth control/protection: None  Lifestyle   Physical activity    Days per week: 0 days    Minutes per session: Not on file   Stress: To some extent  Relationships   Social connections    Talks on phone: More than three times a week    Gets together: Not on file    Attends religious service: Never    Active member of club or organization: No    Attends meetings of clubs or organizations: Never    Relationship status: Married   Intimate partner violence    Fear of current or ex partner: No    Emotionally abused: No    Physically abused: No    Forced sexual activity: No  Other Topics Concern   Not on file  Social History Narrative   Not on file    FAMILY  HISTORY: Family History  Problem Relation Age of Onset   Lung cancer Father    Pancreatic cancer Father    Brain cancer Mother    Stomach cancer Paternal Aunt    Stomach cancer Paternal Uncle    Cancer Maternal Grandmother    Kidney cancer Maternal Grandfather     ALLERGIES:  is allergic to contrast media [iodinated diagnostic agents] and ace inhibitors.  MEDICATIONS:  Current Outpatient Medications  Medication Sig Dispense Refill   bicalutamide (CASODEX) 50 MG tablet Take 50 mg by mouth daily at 12 noon.     diltiazem (CARDIZEM CD) 120 MG 24 hr capsule Take 1 capsule (120 mg total) by mouth daily. 30 capsule 1   pioglitazone (ACTOS) 15 MG tablet Take 15 mg by mouth daily.     pravastatin (PRAVACHOL) 80 MG tablet Take 80 mg by mouth at bedtime.      No current facility-administered medications for this visit.      PHYSICAL EXAMINATION: ECOG PERFORMANCE STATUS: 2 - Symptomatic, <50% confined to bed Vitals:   08/30/19 1031  BP: 123/80  Pulse: 82  Resp: 20  Temp: 98.4 F (36.9 C)  SpO2: 99%   Filed Weights   08/30/19 0939  Weight: 218 lb 14.4 oz (99.3 kg)    Physical Exam Constitutional:      General: He is not in acute distress.    Comments: Frail appearance  HENT:     Head: Normocephalic and atraumatic.  Eyes:     General: No scleral icterus.    Pupils: Pupils are equal, round, and reactive to light.  Neck:     Musculoskeletal: Normal range of motion and neck supple.  Cardiovascular:     Rate and Rhythm: Normal rate and regular rhythm.     Heart sounds: Normal heart sounds.  Pulmonary:     Effort: Pulmonary effort is normal. No respiratory distress.     Breath sounds: No wheezing.     Comments: Decreased breath sound bilaterally. Abdominal:     General: Bowel sounds are normal. There is no distension.     Palpations: Abdomen is soft. There is no mass.     Tenderness: There is no abdominal tenderness.  Musculoskeletal: Normal range of motion.         General: No deformity.  Skin:    General: Skin is warm and dry.     Findings: No erythema or rash.  Neurological:     Mental Status: He is alert and oriented to person, place, and time.     Cranial Nerves: No cranial nerve deficit.     Coordination: Coordination normal.  Psychiatric:        Behavior: Behavior normal.        Thought Content: Thought content normal.   Bilateral gynecomastia  LABORATORY DATA:  I have reviewed the data as listed Lab Results  Component Value Date   WBC 7.7 08/30/2019   HGB 13.2 08/30/2019   HCT 41.1 08/30/2019   MCV 92.4 08/30/2019   PLT 270 08/30/2019   Recent Labs    05/26/19 0847 05/27/19 0437 05/30/19 1236 08/30/19 0926  NA 138 141 140 135  K 4.6 4.1 4.5 4.5  CL 108 110 110 105  CO2 19* 20* 19* 22  GLUCOSE 279* 152* 158* 168*  BUN 28* 23 20 24*  CREATININE 1.87* 1.50* 1.54* 1.74*  CALCIUM 9.0 8.7* 9.1 8.7*  GFRNONAA 33* 44* 42* 36*  GFRAA 39* 51* 49* 42*  PROT 8.1  --  7.7 8.3*  ALBUMIN 3.9  --  3.8 3.6  AST 17  --  17 21  ALT 10  --  11 13  ALKPHOS 48  --  48 53  BILITOT 0.9  --  0.8 0.8   Iron/TIBC/Ferritin/ %Sat No results found for: IRON, TIBC, FERRITIN, IRONPCTSAT    RADIOGRAPHIC STUDIES: I have personally reviewed the radiological images as listed and agreed with the findings in the report.  Ct Chest Wo Contrast  Result Date: 08/26/2019 CLINICAL DATA:  Lung cancer status post 5 radiation therapy treatments. Intermittent right lower chest discomfort. History of prostate cancer. EXAM: CT CHEST WITHOUT CONTRAST TECHNIQUE: Multidetector CT imaging of the chest was performed following the standard protocol without IV contrast. COMPARISON:  05/24/2019. FINDINGS: Cardiovascular: Atherosclerotic calcification of the aorta, aortic valve and coronary arteries. Heart size normal. No pericardial effusion. Mediastinum/Nodes: Mediastinal lymph nodes are not enlarged by CT size criteria. Hilar regions are difficult to evaluate  without IV contrast. No axillary adenopathy. Esophagus is grossly unremarkable. Lungs/Pleura: Centrilobular and paraseptal emphysema. Posterior segment right upper  lobe mass measures 3.2 x 3.5 cm, decreased slightly from 3.7 x 3.9 cm on 05/24/2019. Adjacent pleural thickening. There are surrounding areas of patchy ground-glass and consolidation throughout the right lung, worst in the right lower lobe. No pleural fluid. Airway is unremarkable. Upper Abdomen: Liver is unremarkable. Stones are seen in the gallbladder. Adrenal glands are unremarkable. Stones are seen in the kidneys. Low-attenuation lesions in the kidneys measure up to 3.2 cm on the left and are likely cysts. Spleen, pancreas, stomach and visualized portions of the bowel are grossly unremarkable. Atherosclerotic calcification of the aorta. No pathologically enlarged lymph nodes. Musculoskeletal: Degenerative changes in the spine. No worrisome lytic or sclerotic lesions. L1 compression fracture is unchanged. Probable subcutaneous sebaceous cyst overlying the lower right posterior ribs. IMPRESSION: 1. Slight decrease in size of the right upper lobe mass with evidence of interval radiation therapy throughout the right hemithorax, worst in the right lower lobe. 2. Cholelithiasis. 3. Bilateral renal stones. 4. Aortic atherosclerosis (ICD10-170.0). Coronary artery calcification. 5.  Emphysema (ICD10-J43.9). Electronically Signed   By: Lorin Picket M.D.   On: 08/26/2019 14:51    ASSESSMENT & PLAN:  1. Squamous cell lung cancer, right (HCC)   2. Stage 3b chronic kidney disease   3. History of prostate cancer   4. Gynecomastia   Cancer Staging Squamous cell lung cancer, right ([place]) Staging form: Lung, AJCC 8th Edition - Clinical stage from 04/20/2019: Stage IIA (cT2b, cN0, cM0) - Signed by Earlie Server, MD on 06/02/2019   # cT2b N0 M0 squamous cell lung cancer. CT images were independently reviewed by me and discussed with patient. Interval slight  decrease in size of the right upper lobe mass with evidence of interval radiation therapy throughout the right hemithorax cholelithiasis.   Recommend continue surveillance CT in 3 months.  Labs are reviewed and discussed with patient. CKD, creatinine 1.74, encourage oral hydration.  Avoid nephrotoxins.  Chronic gynecomastia.  Stable secondary to Casodex.   History of prostate cancer, last PSA 03/29/2019 less than 0.0 0.1.  Continue to monitor.     All questions were answered. The patient knows to call the clinic with any problems questions or concerns.  cc [name], MD   Orders Placed This Encounter  Procedures   CT Chest Wo Contrast    Standing Status:   Future    Standing Expiration Date:   08/29/2020    Order Specific Question:   ** REASON FOR EXAM (FREE TEXT)    Answer:   lung cancer    Order Specific Question:   Preferred imaging location?    Answer:   [facility]    Order Specific Question:   Radiology Contrast Protocol - do NOT remove file path    Answer:   [email or web address]   CBC with Differential    Standing Status:   Future    Standing Expiration Date:   08/29/2020   Comprehensive metabolic panel    Standing Status:   Future    Standing Expiration Date:   08/29/2020    Return of visit:  3 months   Earlie Server, MD, PhD Hematology Oncology [facility] Pager- [phone] 08/30/2019

## 2019-09-23 DIAGNOSIS — I2510 Atherosclerotic heart disease of native coronary artery without angina pectoris: Secondary | ICD-10-CM | POA: Insufficient documentation

## 2019-10-03 ENCOUNTER — Other Ambulatory Visit: Payer: Self-pay | Admitting: Oncology

## 2019-11-03 ENCOUNTER — Ambulatory Visit
Admission: RE | Admit: 2019-11-03 | Discharge: 2019-11-03 | Disposition: A | Discharge status: Home / Self Care | Payer: Medicare Other | Source: Ambulatory Visit | Attending: Radiation Oncology | Admitting: Radiation Oncology

## 2019-11-03 ENCOUNTER — Encounter: Payer: Self-pay | Admitting: Radiation Oncology

## 2019-11-03 ENCOUNTER — Other Ambulatory Visit: Payer: Self-pay | Admitting: *Deleted

## 2019-11-03 ENCOUNTER — Other Ambulatory Visit: Payer: Self-pay

## 2019-11-03 VITALS — BP 133/68 | HR 75 | Resp 22 | Wt 217.2 lb

## 2019-11-03 DIAGNOSIS — C3411 Malignant neoplasm of upper lobe, right bronchus or lung: Secondary | ICD-10-CM | POA: Insufficient documentation

## 2019-11-03 DIAGNOSIS — Z87891 Personal history of nicotine dependence: Secondary | ICD-10-CM | POA: Diagnosis not present

## 2019-11-03 DIAGNOSIS — Z923 Personal history of irradiation: Secondary | ICD-10-CM | POA: Diagnosis not present

## 2019-11-03 DIAGNOSIS — C3491 Malignant neoplasm of unspecified part of right bronchus or lung: Secondary | ICD-10-CM

## 2019-11-03 NOTE — Progress Notes (Signed)
Radiation Oncology Follow up Note  Name: Carlos Levine   Date:   11/03/2019 MRN:  [id number] DOB: 1941-03-28    This 79 y.o. male presents to the clinic today for 32-month follow-up status post SBRT to his right lung for stage I squamous cell carcinoma.  REFERRING PROVIDER: [name], MD  HPI: Patient is an 79 year old male now out 4 months having completed SBRT to his right lung for stage I squamous cell carcinoma seen today in routine follow-up he is doing fairly well.  Still is a slight clear productive cough.  No chest tightness no hemoptysis.  Unfortunately his wife recently had a CVA and is recovering at [facility].  He had a CT scan.  Back in November showing slight decrease in the right upper lobe mass with evidence of interval radiation therapy throughout the right hemithorax.  COMPLICATIONS OF TREATMENT: none  FOLLOW UP COMPLIANCE: keeps appointments   PHYSICAL EXAM:  BP 133/68 (BP Location: Left Arm, Patient Position: Sitting)   Pulse 75   Resp (!) 22   Wt 217 lb 3.2 oz (98.5 kg)   BMI 31.16 kg/m  Well-developed well-nourished patient in NAD. HEENT reveals PERLA, EOMI, discs not visualized.  Oral cavity is clear. No oral mucosal lesions are identified. Neck is clear without evidence of cervical or supraclavicular adenopathy. Lungs are clear to A&P. Cardiac examination is essentially unremarkable with regular rate and rhythm without murmur rub or thrill. Abdomen is benign with no organomegaly or masses noted. Motor sensory and DTR levels are equal and symmetric in the upper and lower extremities. Cranial nerves II through XII are grossly intact. Proprioception is intact. No peripheral adenopathy or edema is identified. No motor or sensory levels are noted. Crude visual fields are within normal range.  RADIOLOGY RESULTS: CT scan reviewed compatible with above-stated findings  PLAN: Patient is already scheduled for another CT scan at the end of February we will review  that when it becomes available otherwise have asked to see him at about 7 months for follow-up and repeat CT scan at that time.  We wish his wife well.  Patient knows to call with any concerns.    Noreene Filbert, MD

## 2019-11-06 ENCOUNTER — Other Ambulatory Visit: Payer: Self-pay | Admitting: Urology

## 2019-11-14 ENCOUNTER — Other Ambulatory Visit: Payer: Self-pay

## 2019-11-14 ENCOUNTER — Encounter: Payer: Self-pay | Admitting: Urology

## 2019-11-14 ENCOUNTER — Ambulatory Visit (INDEPENDENT_AMBULATORY_CARE_PROVIDER_SITE_OTHER): Payer: Medicare Other | Admitting: Urology

## 2019-11-14 VITALS — BP 130/72 | HR 79 | Ht 68.0 in | Wt 221.0 lb

## 2019-11-14 DIAGNOSIS — C61 Malignant neoplasm of prostate: Secondary | ICD-10-CM | POA: Diagnosis not present

## 2019-11-14 MED ORDER — BICALUTAMIDE 50 MG PO TABS: 50.0000 mg | ORAL_TABLET | Freq: Every day | ORAL | 3 refills | Status: DC

## 2019-11-14 NOTE — Progress Notes (Signed)
11/14/2019 10:20 AM   Carlos Levine [birth year] [id number]  Referring provider: [name], MD [facility] [place],  [place] [address]  Chief Complaint  Patient presents with  . Medication Refill    Urologic history: 1.  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 male presents for annual follow-up.  Since his visit last year he has been diagnosed with squamous cell carcinoma the lung treated with radiation.  He remains on Casodex 50 mg daily.  He has no bothersome lower urinary tract symptoms.  Denies dysuria or gross hematuria.   PMH: Past Medical History:  Diagnosis Date  . Arthritis   . Cancer [facility])    prostate  . Chronic kidney disease   . Diabetes mellitus without complication ([place])   . History of kidney stones   . Hyperlipidemia   . Prostate cancer [facility])     Surgical History: Past Surgical History:  Procedure Laterality Date  . AMPUTATION FINGER / THUMB Right   . PROSTATE CRYOABLATION    . SPINAL FUSION    . VIDEO BRONCHOSCOPY WITH ENDOBRONCHIAL NAVIGATION N/A 05/30/2019   Procedure: VIDEO BRONCHOSCOPY WITH ENDOBRONCHIAL NAVIGATION, DIABETIC;  Surgeon: Ottie Glazier, MD;  Location: [facility] ORS;  Service: Thoracic;  Laterality: N/A;  . VIDEO BRONCHOSCOPY WITH ENDOBRONCHIAL ULTRASOUND N/A 05/30/2019   Procedure: VIDEO BRONCHOSCOPY WITH ENDOBRONCHIAL ULTRASOUND, DIABETIC;  Surgeon: Ottie Glazier, MD;  Location: [facility] ORS;  Service: Thoracic;  Laterality: N/A;    Home Medications:  Allergies as of 11/14/2019      Reactions   Contrast Media [iodinated Diagnostic Agents] Hives, Rash   Ace Inhibitors Other (See Comments)   Unknown  reaction type      Medication List       Accurate as of November 14, 2019 10:20 AM. If you have any questions, ask your nurse or doctor.        bicalutamide 50 MG tablet Commonly known as: CASODEX Take 50 mg by mouth daily at 12 noon.   diltiazem 120 MG 24 hr capsule Commonly known as: Cardizem CD Take 1 capsule (120 mg total) by mouth daily.   pioglitazone 15 MG tablet Commonly known as: ACTOS Take 15 mg by mouth daily.   pravastatin 80 MG tablet Commonly known as: PRAVACHOL Take 80 mg by mouth at bedtime.   Spiriva Respimat 1.25 MCG/ACT Aers Generic drug: Tiotropium Bromide Monohydrate SMARTSIG:2 Puff(s) Via Inhaler Daily       Allergies:  Allergies  Allergen Reactions  . Contrast Media [Iodinated Diagnostic Agents] Hives and Rash  . Ace Inhibitors Other (See Comments)    Unknown reaction type    Family History: Family History  Problem Relation Age of Onset  . Lung cancer Father   . Pancreatic cancer Father   . Brain cancer Mother   . Stomach cancer Paternal Aunt   . Stomach cancer Paternal Uncle   . Cancer Maternal Grandmother   .  Kidney cancer Maternal Grandfather     Social History:  reports that he quit smoking about 10 years ago. His smoking use included cigarettes. He quit after 30.00 years of use. He has never used smokeless tobacco. He reports that he does not drink alcohol or use drugs.  ROS: UROLOGY Frequent Urination?: No Hard to postpone urination?: No Burning/pain with urination?: No Get up at night to urinate?: No Leakage of urine?: No Urine stream starts and stops?: No Trouble starting stream?: No Do you have to strain to urinate?: No Blood in urine?: No Urinary tract infection?: No Sexually transmitted disease?: No Injury to kidneys or bladder?: No Painful intercourse?: No Weak stream?: No Erection problems?: No Penile pain?: No  Gastrointestinal Nausea?: No Vomiting?: No Indigestion/heartburn?: No Diarrhea?:  No Constipation?: No  Constitutional Fever: No Night sweats?: No Weight loss?: No Fatigue?: No  Skin Skin rash/lesions?: No Itching?: No  Eyes Blurred vision?: No Double vision?: No  Ears/Nose/Throat Sore throat?: No Sinus problems?: No  Hematologic/Lymphatic Swollen glands?: No Easy bruising?: Yes  Cardiovascular Leg swelling?: No Chest pain?: No  Respiratory Cough?: No Shortness of breath?: Yes  Endocrine Excessive thirst?: No  Musculoskeletal Back pain?: No Joint pain?: No  Neurological Headaches?: No Dizziness?: Yes  Psychologic Depression?: No Anxiety?: No  Physical Exam: BP 130/72   Pulse 79   Ht 5\' 8"  (1.727 m)   Wt 221 lb (100.2 kg)   BMI 33.60 kg/m   Constitutional:  Alert and oriented, No acute distress. HEENT: [place] AT, moist mucus membranes.  Trachea midline, no masses. Cardiovascular: No clubbing, cyanosis, or edema. Respiratory: Normal respiratory effort, no increased work of breathing. Skin: No rashes, bruises or suspicious lesions. Neurologic: Grossly intact, no focal deficits, moving all 4 extremities. Psychiatric: Normal mood and affect.   Assessment & Plan:    - Prostate cancer He states he is scheduled for blood work in the [facility] early next month and asked if his PSA could be drawn then.  An order was entered.  Casodex was refilled.  Continue annual follow-up.   Abbie Sons, [place] [address], [place], [place] [address] [phone]

## 2019-11-15 ENCOUNTER — Other Ambulatory Visit: Payer: Self-pay | Admitting: Oncology

## 2019-11-15 DIAGNOSIS — Z8546 Personal history of malignant neoplasm of prostate: Secondary | ICD-10-CM

## 2019-11-16 ENCOUNTER — Encounter: Payer: Self-pay | Admitting: *Deleted

## 2019-11-25 ENCOUNTER — Other Ambulatory Visit: Payer: Self-pay

## 2019-11-25 ENCOUNTER — Ambulatory Visit
Admission: RE | Admit: 2019-11-25 | Discharge: 2019-11-25 | Disposition: A | Discharge status: Home / Self Care | Payer: Medicare Other | Source: Ambulatory Visit | Attending: Oncology | Admitting: Oncology

## 2019-11-25 DIAGNOSIS — C3491 Malignant neoplasm of unspecified part of right bronchus or lung: Secondary | ICD-10-CM | POA: Diagnosis not present

## 2019-11-28 ENCOUNTER — Encounter: Payer: Self-pay | Admitting: Oncology

## 2019-11-28 ENCOUNTER — Inpatient Hospital Stay: Payer: Medicare Other | Attending: Oncology

## 2019-11-28 ENCOUNTER — Inpatient Hospital Stay (HOSPITAL_BASED_OUTPATIENT_CLINIC_OR_DEPARTMENT_OTHER): Payer: Medicare Other | Admitting: Oncology

## 2019-11-28 ENCOUNTER — Other Ambulatory Visit: Payer: Self-pay

## 2019-11-28 VITALS — BP 113/77 | HR 103 | Temp 97.0°F | Resp 16 | Wt 215.3 lb

## 2019-11-28 DIAGNOSIS — Z809 Family history of malignant neoplasm, unspecified: Secondary | ICD-10-CM | POA: Insufficient documentation

## 2019-11-28 DIAGNOSIS — Z7984 Long term (current) use of oral hypoglycemic drugs: Secondary | ICD-10-CM | POA: Diagnosis not present

## 2019-11-28 DIAGNOSIS — Z8546 Personal history of malignant neoplasm of prostate: Secondary | ICD-10-CM | POA: Insufficient documentation

## 2019-11-28 DIAGNOSIS — Z87891 Personal history of nicotine dependence: Secondary | ICD-10-CM | POA: Diagnosis not present

## 2019-11-28 DIAGNOSIS — Z800 Family history of malignant neoplasm of digestive organs: Secondary | ICD-10-CM | POA: Insufficient documentation

## 2019-11-28 DIAGNOSIS — Z923 Personal history of irradiation: Secondary | ICD-10-CM | POA: Diagnosis not present

## 2019-11-28 DIAGNOSIS — N62 Hypertrophy of breast: Secondary | ICD-10-CM | POA: Insufficient documentation

## 2019-11-28 DIAGNOSIS — I471 Supraventricular tachycardia: Secondary | ICD-10-CM | POA: Insufficient documentation

## 2019-11-28 DIAGNOSIS — Z79899 Other long term (current) drug therapy: Secondary | ICD-10-CM | POA: Diagnosis not present

## 2019-11-28 DIAGNOSIS — C3491 Malignant neoplasm of unspecified part of right bronchus or lung: Secondary | ICD-10-CM

## 2019-11-28 DIAGNOSIS — M545 Low back pain: Secondary | ICD-10-CM | POA: Insufficient documentation

## 2019-11-28 DIAGNOSIS — Z8051 Family history of malignant neoplasm of kidney: Secondary | ICD-10-CM | POA: Diagnosis not present

## 2019-11-28 DIAGNOSIS — I440 Atrioventricular block, first degree: Secondary | ICD-10-CM | POA: Insufficient documentation

## 2019-11-28 DIAGNOSIS — N1832 Chronic kidney disease, stage 3b: Secondary | ICD-10-CM | POA: Diagnosis not present

## 2019-11-28 DIAGNOSIS — E785 Hyperlipidemia, unspecified: Secondary | ICD-10-CM | POA: Diagnosis not present

## 2019-11-28 DIAGNOSIS — E119 Type 2 diabetes mellitus without complications: Secondary | ICD-10-CM | POA: Insufficient documentation

## 2019-11-28 DIAGNOSIS — Z801 Family history of malignant neoplasm of trachea, bronchus and lung: Secondary | ICD-10-CM | POA: Insufficient documentation

## 2019-11-28 LAB — CBC WITH DIFFERENTIAL/PLATELET
Abs Immature Granulocytes: 0.04 10*3/uL (ref 0.00–0.07)
Basophils Absolute: 0 10*3/uL (ref 0.0–0.1)
Basophils Relative: 0 %
Eosinophils Absolute: 0.1 10*3/uL (ref 0.0–0.5)
Eosinophils Relative: 1 %
HCT: 42.8 % (ref 39.0–52.0)
Hemoglobin: 13.1 g/dL (ref 13.0–17.0)
Immature Granulocytes: 0 %
Lymphocytes Relative: 28 %
Lymphs Abs: 2.5 10*3/uL (ref 0.7–4.0)
MCH: 28.5 pg (ref 26.0–34.0)
MCHC: 30.6 g/dL (ref 30.0–36.0)
MCV: 93.2 fL (ref 80.0–100.0)
Monocytes Absolute: 0.9 10*3/uL (ref 0.1–1.0)
Monocytes Relative: 10 %
Neutro Abs: 5.5 10*3/uL (ref 1.7–7.7)
Neutrophils Relative %: 61 %
Platelets: 313 10*3/uL (ref 150–400)
RBC: 4.59 MIL/uL (ref 4.22–5.81)
RDW: 13.2 % (ref 11.5–15.5)
WBC: 9.1 10*3/uL (ref 4.0–10.5)
nRBC: 0 % (ref 0.0–0.2)

## 2019-11-28 LAB — COMPREHENSIVE METABOLIC PANEL
ALT: 7 U/L (ref 0–44)
AST: 15 U/L (ref 15–41)
Albumin: 3.6 g/dL (ref 3.5–5.0)
Alkaline Phosphatase: 53 U/L (ref 38–126)
Anion gap: 12 (ref 5–15)
BUN: 24 mg/dL — ABNORMAL HIGH (ref 8–23)
CO2: 20 mmol/L — ABNORMAL LOW (ref 22–32)
Calcium: 9.2 mg/dL (ref 8.9–10.3)
Chloride: 104 mmol/L (ref 98–111)
Creatinine, Ser: 1.61 mg/dL — ABNORMAL HIGH (ref 0.61–1.24)
GFR calc Af Amer: 46 mL/min — ABNORMAL LOW (ref >60)
GFR calc non Af Amer: 40 mL/min — ABNORMAL LOW (ref >60)
Glucose, Bld: 172 mg/dL — ABNORMAL HIGH (ref 70–99)
Potassium: 4.5 mmol/L (ref 3.5–5.1)
Sodium: 136 mmol/L (ref 135–145)
Total Bilirubin: 0.5 mg/dL (ref 0.3–1.2)
Total Protein: 8.9 g/dL — ABNORMAL HIGH (ref 6.5–8.1)

## 2019-11-28 LAB — PSA: Prostatic Specific Antigen: <0.01 ng/mL (ref 0.00–4.00)

## 2019-11-28 NOTE — Progress Notes (Signed)
His heart rate feels irregular today and on first check was 162 then went down to 90's -103

## 2019-11-28 NOTE — Progress Notes (Signed)
Hematology/Oncology  [place] [phone] [phone]   Patient Care Team: [name], MD as PCP - General (Internal Medicine) Telford Nab, RN as Registered Nurse  REFERRING PROVIDER: [name], MD  REASON FOR VISIT:  Follow-up for squamous cell carcinoma  HISTORY OF PRESENTING ILLNESS:   Carlos Levine is a  79 y.o.  male with PMH listed below was seen in consultation at the request of  [name], MD  for evaluation of lung mass.  Patient reports chronic SOB for about 1 year. Cough gets worse recently, productive with clear mucus.  No exacerbating or alleviating factors.  Former smoker, quit smoking in 2012.  Used to have 30-pack-year smoking history. Patient had a chest x-ray done followed by CT chest which showed lung mass and hilar lymphadenopathy.  Refer to heme-onc for further evaluation and management.  Remote history of prostate cancer, previously follows up with Dr. [name].  Patient is on Casodex 50 mg daily. Was last seen by Dr. [name] in 2018., Malignant neoplasm of prostate ([facility]) 02/2006  Gleason's 8 in 4 of 8 biopsy specimens all from the right side. PSA 12.2  PSA 01/23/2017 is <0.01  Chronic lower back pain and radiculopathy. Lives at home with wife, daughter and granddaughter  CT-guided biopsy on 04/13/2019. Pathology showed squamous cell lung cancer.  #There was plan for bronchoscopy/EBUS assisted lymph node biopsy  procedure was canceled as patient was found to be in SVT.  Patient was sent to emergency room for further evaluation. Work-up included CTA that was negative for PE.  Echocardiogram EF 55 to 60%. 05/26/2019 CT angiogram PE protocol showed a spiculated mass right posterior apical segment unchanged. Discussed with pulmonology, little mediastinum hilar lymph adenopathy was seen. Previous hilar lymphadenopathy probably reactive. Given patient's age, multiple comorbidities, discussed with  patient that EBUS assisted lymph node biopsy may not be beneficial.  Patient has establish care with radiation oncology and was recommended to proceed with SBRT. Discussed with pulmonology Dr.Aleskerov and he is in agreement.    INTERVAL HISTORY DEMOSTHENES Levine is a 79 y.o. male who has above history reviewed by me today presents for follow up visit for management of squamous cell lung cancer Patient status post SBRT of right lobe mass. Patient reports recently being stressed due to wife had stroke 2 weeks ago. He reports cough has significantly improved. Chronic shortness of breath with exertion which is chronic.  No change  Review of Systems  Constitutional: Positive for fatigue. Negative for appetite change, chills, fever and unexpected weight change.  HENT:   Negative for hearing loss and voice change.   Eyes: Negative for eye problems and icterus.  Respiratory: Positive for shortness of breath. Negative for chest tightness and cough.   Cardiovascular: Negative for chest pain and leg swelling.  Gastrointestinal: Negative for abdominal distention and abdominal pain.  Endocrine: Negative for hot flashes.  Genitourinary: Negative for difficulty urinating, dysuria and frequency.   Musculoskeletal: Positive for back pain. Negative for arthralgias.  Skin: Negative for itching and rash.  Neurological: Negative for light-headedness and numbness.  Hematological: Negative for adenopathy. Does not bruise/bleed easily.  Psychiatric/Behavioral: Negative for confusion.    MEDICAL HISTORY:  Past Medical History:  Diagnosis Date  . Arthritis   . Cancer [facility])    prostate  . Chronic kidney disease   . Diabetes mellitus without complication ([place])   . History of kidney stones   . Hyperlipidemia   .  Prostate cancer [facility])     SURGICAL HISTORY: Past Surgical History:  Procedure Laterality Date  . AMPUTATION FINGER / THUMB Right   . PROSTATE CRYOABLATION    . SPINAL FUSION    . VIDEO  BRONCHOSCOPY WITH ENDOBRONCHIAL NAVIGATION N/A 05/30/2019   Procedure: VIDEO BRONCHOSCOPY WITH ENDOBRONCHIAL NAVIGATION, DIABETIC;  Surgeon: Ottie Glazier, MD;  Location: [facility] ORS;  Service: Thoracic;  Laterality: N/A;  . VIDEO BRONCHOSCOPY WITH ENDOBRONCHIAL ULTRASOUND N/A 05/30/2019   Procedure: VIDEO BRONCHOSCOPY WITH ENDOBRONCHIAL ULTRASOUND, DIABETIC;  Surgeon: Ottie Glazier, MD;  Location: [facility] ORS;  Service: Thoracic;  Laterality: N/A;    SOCIAL HISTORY: Social History   Socioeconomic History  . Marital status: Married    Spouse name: [name]  . Number of children: 4  . Years of education: Not on file  . Highest education level: Not on file  Occupational History  . Occupation: retired  Tobacco Use  . Smoking status: Former Smoker    Years: 30.00    Types: Cigarettes    Quit date: 10/20/2009    Years since quitting: 10.1  . Smokeless tobacco: Never Used  Substance and Sexual Activity  . Alcohol use: No  . Drug use: No  . Sexual activity: Yes    Birth control/protection: None  Other Topics Concern  . Not on file  Social History Narrative  . Not on file   Social Determinants of Health   Financial Resource Strain: Low Risk   . Difficulty of Paying Living Expenses: Not hard at all  Food Insecurity: No Food Insecurity  . Worried About Charity fundraiser in the Last Year: Never true  . Ran Out of Food in the Last Year: Never true  Transportation Needs: No Transportation Needs  . Lack of Transportation (Medical): No  . Lack of Transportation (Non-Medical): No  Physical Activity: Unknown  . Days of Exercise per Week: 0 days  . Minutes of Exercise per Session: Not on file  Stress: Stress Concern Present  . Feeling of Stress : To some extent  Social Connections: Somewhat Isolated  . Frequency of Communication with Friends and Family: More than three times a week  . Frequency of Social Gatherings with Friends and Family: Not on file  . Attends Religious Services: Never    . Active Member of Clubs or Organizations: No  . Attends Archivist Meetings: Never  . Marital Status: Married  Human resources officer Violence: Not At Risk  . Fear of Current or Ex-Partner: No  . Emotionally Abused: No  . Physically Abused: No  . Sexually Abused: No    FAMILY HISTORY: Family History  Problem Relation Age of Onset  . Lung cancer Father   . Pancreatic cancer Father   . Brain cancer Mother   . Stomach cancer Paternal Aunt   . Stomach cancer Paternal Uncle   . Cancer Maternal Grandmother   . Kidney cancer Maternal Grandfather     ALLERGIES:  is allergic to contrast media [iodinated diagnostic agents] and ace inhibitors.  MEDICATIONS:  Current Outpatient Medications  Medication Sig Dispense Refill  . bicalutamide (CASODEX) 50 MG tablet Take 1 tablet (50 mg total) by mouth daily at 12 noon. 90 tablet 3  . diltiazem (CARDIZEM CD) 120 MG 24 hr capsule Take 1 capsule (120 mg total) by mouth daily. 30 capsule 1  . pioglitazone (ACTOS) 15 MG tablet Take 15 mg by mouth daily.    . pravastatin (PRAVACHOL) 80 MG tablet Take 80 mg by mouth at  bedtime.     Marland Kitchen SPIRIVA RESPIMAT 1.25 MCG/ACT AERS SMARTSIG:2 Puff(s) Via Inhaler Daily     No current facility-administered medications for this visit.     PHYSICAL EXAMINATION: ECOG PERFORMANCE STATUS: 2 - Symptomatic, <50% confined to bed Vitals:   11/28/19 1002  BP: 113/77  Pulse: (!) 103  Resp: 16  Temp: (!) 97 F (36.1 C)   Filed Weights   11/28/19 1002  Weight: 215 lb 4.8 oz (97.7 kg)    Physical Exam Constitutional:      General: He is not in acute distress.    Comments: Frail appearance  HENT:     Head: Normocephalic and atraumatic.  Eyes:     General: No scleral icterus.    Pupils: Pupils are equal, round, and reactive to light.  Cardiovascular:     Rate and Rhythm: Normal rate and regular rhythm.     Comments: Premature heartbeats Pulmonary:     Effort: Pulmonary effort is normal. No respiratory  distress.     Breath sounds: No wheezing.     Comments: Decreased breath sound bilaterally. Abdominal:     General: Bowel sounds are normal. There is no distension.     Palpations: Abdomen is soft. There is no mass.     Tenderness: There is no abdominal tenderness.  Musculoskeletal:        General: No deformity. Normal range of motion.     Cervical back: Normal range of motion and neck supple.  Skin:    General: Skin is warm and dry.     Findings: No erythema or rash.  Neurological:     Mental Status: He is alert and oriented to person, place, and time.     Cranial Nerves: No cranial nerve deficit.     Coordination: Coordination normal.  Psychiatric:        Behavior: Behavior normal.        Thought Content: Thought content normal.   Bilateral gynecomastia  LABORATORY DATA:  I have reviewed the data as listed Lab Results  Component Value Date   WBC 9.1 11/28/2019   HGB 13.1 11/28/2019   HCT 42.8 11/28/2019   MCV 93.2 11/28/2019   PLT 313 11/28/2019   Recent Labs    05/30/19 1236 08/30/19 0926 11/28/19 0934  NA 140 135 136  K 4.5 4.5 4.5  CL 110 105 104  CO2 19* 22 20*  GLUCOSE 158* 168* 172*  BUN 20 24* 24*  CREATININE 1.54* 1.74* 1.61*  CALCIUM 9.1 8.7* 9.2  GFRNONAA 42* 36* 40*  GFRAA 49* 42* 46*  PROT 7.7 8.3* 8.9*  ALBUMIN 3.8 3.6 3.6  AST 17 21 15   ALT 11 13 7   ALKPHOS 48 53 53  BILITOT 0.8 0.8 0.5   Iron/TIBC/Ferritin/ %Sat No results found for: IRON, TIBC, FERRITIN, IRONPCTSAT    RADIOGRAPHIC STUDIES: I have personally reviewed the radiological images as listed and agreed with the findings in the report.  CT Chest Wo Contrast  Result Date: 11/25/2019 CLINICAL DATA:  Follow-up right lung cancer diagnosed last year history of SBRT EXAM: CT CHEST WITHOUT CONTRAST TECHNIQUE: Multidetector CT imaging of the chest was performed following the standard protocol without IV contrast. COMPARISON:  Comparison of 08/26/2019 FINDINGS: Cardiovascular: Calcified  and noncalcified atherosclerotic plaque throughout the thoracic aorta. Not well evaluated given lack of intravenous contrast but unchanged from previous imaging. Signs of three-vessel coronary artery disease and mitral annular calcification as before. Heart size normal without pericardial effusion. Central pulmonary vasculature  is normal caliber. Mediastinum/Nodes: Thoracic inlet structures are normal. Esophagus grossly unremarkable. No signs of axillary lymphadenopathy. Small lymph nodes in the chest perhaps mildly enlarged compared to the prior study. 8 mm short axis nodal tissue along the right paratracheal chain previously 7 mm. No signs of thoracic inlet adenopathy. Other small scattered lymph nodes in the chest are unchanged. Right hilar lymph node increased in size (image 63, series 2) 1.6 cm short axis, short axis measurement previously 1.2 cm. No signs of left hilar adenopathy. Lungs/Pleura: Right upper lobe mass now obscured by consolidative changes tracking from the hilum to the periphery of the right lung. Consolidative changes were seen on the prior study now with further volume loss since the previous exam. Central low attenuation at the site of previously identified mass measuring 2.5 x 2.0 cm. Previously the discrete mass lesion measured 3 x 2.9 cm, completely obscured aside from central low attenuation on today's study. Area of consolidative change total measuring 7 by 5.4 cm in greatest axial dimension. Signs of pulmonary emphysema as before. Distortion of the fissure following volume loss and radiation therapy. No signs of new pulmonary nodule. Small right pleural effusion. Upper Abdomen: Incidental imaging of upper abdominal contents with signs of cholelithiasis. Partially imaged presumed renal cysts. Signs of mild pancreatic atrophy. Musculoskeletal: Presumed sebaceous cyst along right posterior chest. Just deep to the skin surface unchanged from prior study. No signs of chest wall lesion. No  destructive bone process. L1 compression fracture with similar appearance. IMPRESSION: 1. Right upper lobe mass now obscured by consolidative changes tracking from the hilum to the periphery of the right lung. Previously the discrete mass measured 3 x 2.9 cm, completely obscured aside from central low attenuation on today's study. 2. Increasing size of right hilar lymph node. PET scan may be helpful for further assessment given this finding. This could also potentially be reactive in the setting of prior radiation. 3. Slight increase in size of right paratracheal lymph node. 4. Small right pleural effusion. 5. Signs of three-vessel coronary artery disease and mitral annular calcification as before. 6. Cholelithiasis. 7. Unchanged L1 compression fracture. Aortic Atherosclerosis (ICD10-I70.0). Emphysema (ICD10-J43.9). Electronically Signed   By: Zetta Bills M.D.   On: 11/25/2019 10:31    ASSESSMENT & PLAN:  1. Squamous cell lung cancer, right ([place])   2. History of prostate cancer   3. Stage 3b chronic kidney disease   4. Gynecomastia   Cancer Staging Squamous cell lung cancer, right ([place]) Staging form: Lung, AJCC 8th Edition - Clinical stage from 04/20/2019: Stage IIA (cT2b, cN0, cM0) - Signed by Earlie Server, MD on 06/02/2019   # cT2b N0 M0 squamous cell lung cancer. CT images were independently reviewed by me and discussed with patient Right upper lobe mass now obscured by consolidative changes.  Previously discrete mass completely obscured aside from essential low-attenuation on this study. Increased size of right hilar lobe. Slightly increasing size of right paratracheal node.  Small right pleural effusion. Chronic changes includes signs of three-vessel coronary artery disease and mitral annular calcification.  Cholelithiasis.  Unchanged L1 compression fracture.  Images were reviewed and discussed with patient.  I recommend obtaining PET scan for further evaluation of the increased size of right  hilar and paratracheal node.  Reactive versus metastasis.   CKD, creatinine is stable.  Avoid nephrotoxins. History of prostate cancer.  PSA was checked today and it remains less than 0.01.  Patient was recently seen by urology. Chronic gynecomastia.  Stable secondary  to Casodex.   Tachycardia, patient initially had a pulse rate of 162, went down to 90.   EKG was done in the clinic and was reviewed by me.  Sinus rhythm with first-degree AV block, premature ventricular beats.  He had a history of SVT.Patient takes diltiazem and was recently seen by Dr. Nehemiah Massed.  Continue follow-up with primary care provider and cardiology.  All questions were answered. The patient knows to call the clinic with any problems questions or concerns.  cc [name], MD   Orders Placed This Encounter  Procedures  . NM PET Image Restag (PS) Skull Base To Thigh    Standing Status:   Future    Standing Expiration Date:   11/27/2020    Order Specific Question:   If indicated for the ordered procedure, I authorize the administration of a radiopharmaceutical per Radiology protocol    Answer:   Yes    Order Specific Question:   Preferred imaging location?    Answer:   [facility]    Order Specific Question:   Radiology Contrast Protocol - do NOT remove file path    Answer:   [email or web address]    Return of visit:  To be determined.  Pending PET scan  Earlie Server, MD, PhD Hematology Oncology [facility] Pager- [phone] 11/28/2019

## 2019-12-27 ENCOUNTER — Encounter
Admission: RE | Admit: 2019-12-27 | Discharge: 2019-12-27 | Disposition: A | Discharge status: Home / Self Care | Payer: Medicare Other | Source: Ambulatory Visit | Attending: Oncology | Admitting: Oncology

## 2019-12-27 ENCOUNTER — Other Ambulatory Visit: Payer: Self-pay

## 2019-12-27 DIAGNOSIS — C3491 Malignant neoplasm of unspecified part of right bronchus or lung: Secondary | ICD-10-CM | POA: Diagnosis not present

## 2019-12-27 DIAGNOSIS — Z79899 Other long term (current) drug therapy: Secondary | ICD-10-CM | POA: Insufficient documentation

## 2019-12-27 LAB — GLUCOSE, CAPILLARY: Glucose-Capillary: 146 mg/dL — ABNORMAL HIGH (ref 70–99)

## 2019-12-27 MED ORDER — FLUDEOXYGLUCOSE F - 18 (FDG) INJECTION
10.8000 | Freq: Once | INTRAVENOUS | Status: AC | PRN
  Administered 2019-12-27: 11.27 via INTRAVENOUS

## 2020-01-16 ENCOUNTER — Other Ambulatory Visit: Payer: Self-pay | Admitting: Oncology

## 2020-01-16 DIAGNOSIS — C3491 Malignant neoplasm of unspecified part of right bronchus or lung: Secondary | ICD-10-CM

## 2020-01-17 ENCOUNTER — Telehealth: Payer: Self-pay

## 2020-01-17 NOTE — Telephone Encounter (Signed)
Done.. CT and MD f/u appt has been scheduled as requested Called and left a detailed message on pts vmail making him aware of the location, date and time of the sched CT appt  And also his RTC date and time to see Dr.Yu 2 days after his CT scan. A reminder letter will be mailed out as well.

## 2020-01-17 NOTE — Telephone Encounter (Signed)
-----   Message from Earlie Server, MD sent at 01/16/2020 11:41 PM EDT ----- Please let patient know that his PET scan looks fine. I recommend to follow up with a repeat CT chest in 3 months, and follow up with me.  Thanks.

## 2020-01-17 NOTE — Telephone Encounter (Signed)
Pt has been notified.

## 2020-04-17 ENCOUNTER — Other Ambulatory Visit: Payer: Self-pay

## 2020-04-17 ENCOUNTER — Ambulatory Visit
Admission: RE | Admit: 2020-04-17 | Discharge: 2020-04-17 | Disposition: A | Discharge status: Home / Self Care | Payer: Medicare Other | Source: Ambulatory Visit | Attending: Oncology | Admitting: Oncology

## 2020-04-17 DIAGNOSIS — C3491 Malignant neoplasm of unspecified part of right bronchus or lung: Secondary | ICD-10-CM | POA: Diagnosis not present

## 2020-04-19 ENCOUNTER — Encounter: Payer: Self-pay | Admitting: Oncology

## 2020-04-19 ENCOUNTER — Other Ambulatory Visit: Payer: Self-pay

## 2020-04-19 ENCOUNTER — Inpatient Hospital Stay: Payer: Medicare Other | Attending: Oncology | Admitting: Oncology

## 2020-04-19 VITALS — BP 147/93 | HR 94 | Temp 96.9°F | Resp 18 | Wt 211.8 lb

## 2020-04-19 DIAGNOSIS — Z803 Family history of malignant neoplasm of breast: Secondary | ICD-10-CM | POA: Diagnosis not present

## 2020-04-19 DIAGNOSIS — Z87891 Personal history of nicotine dependence: Secondary | ICD-10-CM | POA: Diagnosis not present

## 2020-04-19 DIAGNOSIS — J439 Emphysema, unspecified: Secondary | ICD-10-CM | POA: Diagnosis not present

## 2020-04-19 DIAGNOSIS — E119 Type 2 diabetes mellitus without complications: Secondary | ICD-10-CM | POA: Diagnosis not present

## 2020-04-19 DIAGNOSIS — Z801 Family history of malignant neoplasm of trachea, bronchus and lung: Secondary | ICD-10-CM | POA: Insufficient documentation

## 2020-04-19 DIAGNOSIS — C3491 Malignant neoplasm of unspecified part of right bronchus or lung: Secondary | ICD-10-CM | POA: Diagnosis not present

## 2020-04-19 DIAGNOSIS — N189 Chronic kidney disease, unspecified: Secondary | ICD-10-CM | POA: Insufficient documentation

## 2020-04-19 DIAGNOSIS — N62 Hypertrophy of breast: Secondary | ICD-10-CM | POA: Diagnosis not present

## 2020-04-19 DIAGNOSIS — C3490 Malignant neoplasm of unspecified part of unspecified bronchus or lung: Secondary | ICD-10-CM

## 2020-04-19 DIAGNOSIS — C3411 Malignant neoplasm of upper lobe, right bronchus or lung: Secondary | ICD-10-CM | POA: Insufficient documentation

## 2020-04-19 DIAGNOSIS — Z79899 Other long term (current) drug therapy: Secondary | ICD-10-CM | POA: Diagnosis not present

## 2020-04-19 DIAGNOSIS — Z7984 Long term (current) use of oral hypoglycemic drugs: Secondary | ICD-10-CM | POA: Diagnosis not present

## 2020-04-19 DIAGNOSIS — Z8051 Family history of malignant neoplasm of kidney: Secondary | ICD-10-CM | POA: Diagnosis not present

## 2020-04-19 DIAGNOSIS — Z8546 Personal history of malignant neoplasm of prostate: Secondary | ICD-10-CM | POA: Insufficient documentation

## 2020-04-19 DIAGNOSIS — E785 Hyperlipidemia, unspecified: Secondary | ICD-10-CM | POA: Insufficient documentation

## 2020-04-19 HISTORY — DX: Malignant neoplasm of unspecified part of unspecified bronchus or lung: C34.90

## 2020-04-19 NOTE — Progress Notes (Signed)
Hematology/Oncology  Carlos Levine [phone] [phone]   Patient Care Team: [name], MD as PCP - General (Internal Medicine) Telford Nab, RN as Registered Nurse  REFERRING PROVIDER: [name], MD  REASON FOR VISIT:  Follow-up for squamous cell carcinoma  HISTORY OF PRESENTING ILLNESS:   Carlos Levine is a  79 y.o.  male with PMH listed below was seen in consultation at the request of  [name], MD  for evaluation of lung mass.  Patient reports chronic SOB for about 1 year. Cough gets worse recently, productive with clear mucus.  No exacerbating or alleviating factors.  Former smoker, quit smoking in 2012.  Used to have 30-pack-year smoking history. Patient had a chest x-ray done followed by CT chest which showed lung mass and hilar lymphadenopathy.  Refer to heme-onc for further evaluation and management.  Remote history of prostate cancer, previously follows up with Dr. [name].  Patient is on Casodex 50 mg daily. Was last seen by Dr. [name] in 2018., Malignant neoplasm of prostate ([facility]) 02/2006  Gleason's 8 in 4 of 8 biopsy specimens all from the right side. PSA 12.2  PSA 01/23/2017 is <0.01  Chronic lower back pain and radiculopathy. Lives at home with wife, daughter and granddaughter  CT-guided biopsy on 04/13/2019. Pathology showed squamous cell lung cancer.  #There was plan for bronchoscopy/EBUS assisted lymph node biopsy  procedure was canceled as patient was found to be in SVT.  Patient was sent to emergency room for further evaluation. Work-up included CTA that was negative for PE.  Echocardiogram EF 55 to 60%. 05/26/2019 CT angiogram PE protocol showed a spiculated mass right posterior apical segment unchanged. Discussed with pulmonology, little mediastinum hilar lymph adenopathy was seen. Previous hilar lymphadenopathy probably reactive. Given patient's age, multiple comorbidities, discussed with  patient that EBUS assisted lymph node biopsy may not be beneficial.  Patient has establish care with radiation oncology and was recommended to proceed with SBRT. Discussed with pulmonology Dr.Aleskerov and he is in agreement.  #January 2021  SBRT of right lobe mass.  INTERVAL HISTORY Carlos Levine is a 79 y.o. male who has above history reviewed by me today presents for follow up visit for management of squamous cell lung cancer Patient has no new complaints.  He reports feeling well.  Chronic shortness of breath not changed.    Review of Systems  Constitutional: Negative for appetite change, chills, fatigue, fever and unexpected weight change.  HENT:   Negative for hearing loss and voice change.   Eyes: Negative for eye problems and icterus.  Respiratory: Positive for shortness of breath. Negative for chest tightness and cough.   Cardiovascular: Negative for chest pain and leg swelling.  Gastrointestinal: Negative for abdominal distention and abdominal pain.  Endocrine: Negative for hot flashes.  Genitourinary: Negative for difficulty urinating, dysuria and frequency.   Musculoskeletal: Positive for back pain. Negative for arthralgias.  Skin: Negative for itching and rash.  Neurological: Negative for light-headedness and numbness.  Hematological: Negative for adenopathy. Does not bruise/bleed easily.  Psychiatric/Behavioral: Negative for confusion.    MEDICAL HISTORY:  Past Medical History:  Diagnosis Date  . Arthritis   . Cancer [facility])    prostate  . Chronic kidney disease   . Diabetes mellitus without complication ([place])   . History of kidney stones   . Hyperlipidemia   . Prostate cancer [facility])     SURGICAL HISTORY: Past Surgical History:  Procedure Laterality  Date  . AMPUTATION FINGER / THUMB Right   . PROSTATE CRYOABLATION    . SPINAL FUSION    . VIDEO BRONCHOSCOPY WITH ENDOBRONCHIAL NAVIGATION N/A 05/30/2019   Procedure: VIDEO BRONCHOSCOPY WITH ENDOBRONCHIAL  NAVIGATION, DIABETIC;  Surgeon: Ottie Glazier, MD;  Location: [facility] ORS;  Service: Thoracic;  Laterality: N/A;  . VIDEO BRONCHOSCOPY WITH ENDOBRONCHIAL ULTRASOUND N/A 05/30/2019   Procedure: VIDEO BRONCHOSCOPY WITH ENDOBRONCHIAL ULTRASOUND, DIABETIC;  Surgeon: Ottie Glazier, MD;  Location: [facility] ORS;  Service: Thoracic;  Laterality: N/A;    SOCIAL HISTORY: Social History   Socioeconomic History  . Marital status: Married    Spouse name: [name]  . Number of children: 4  . Years of education: Not on file  . Highest education level: Not on file  Occupational History  . Occupation: retired  Tobacco Use  . Smoking status: Former Smoker    Years: 30.00    Types: Cigarettes    Quit date: 10/20/2009    Years since quitting: 10.5  . Smokeless tobacco: Never Used  Vaping Use  . Vaping Use: Never used  Substance and Sexual Activity  . Alcohol use: No  . Drug use: No  . Sexual activity: Yes    Birth control/protection: None  Other Topics Concern  . Not on file  Social History Narrative  . Not on file   Social Determinants of Health   Financial Resource Strain:   . Difficulty of Paying Living Expenses:   Food Insecurity:   . Worried About Charity fundraiser in the Last Year:   . Arboriculturist in the Last Year:   Transportation Needs:   . Film/video editor (Medical):   Marland Kitchen Lack of Transportation (Non-Medical):   Physical Activity:   . Days of Exercise per Week:   . Minutes of Exercise per Session:   Stress:   . Feeling of Stress :   Social Connections:   . Frequency of Communication with Friends and Family:   . Frequency of Social Gatherings with Friends and Family:   . Attends Religious Services:   . Active Member of Clubs or Organizations:   . Attends Archivist Meetings:   Marland Kitchen Marital Status:   Intimate Partner Violence:   . Fear of Current or Ex-Partner:   . Emotionally Abused:   Marland Kitchen Physically Abused:   . Sexually Abused:     FAMILY HISTORY: Family  History  Problem Relation Age of Onset  . Lung cancer Father   . Pancreatic cancer Father   . Brain cancer Mother   . Stomach cancer Paternal Aunt   . Stomach cancer Paternal Uncle   . Cancer Maternal Grandmother   . Kidney cancer Maternal Grandfather     ALLERGIES:  is allergic to contrast media [iodinated diagnostic agents] and ace inhibitors.  MEDICATIONS:  Current Outpatient Medications  Medication Sig Dispense Refill  . bicalutamide (CASODEX) 50 MG tablet Take 1 tablet (50 mg total) by mouth daily at 12 noon. 90 tablet 3  . diltiazem (CARDIZEM CD) 120 MG 24 hr capsule Take 1 capsule (120 mg total) by mouth daily. 30 capsule 1  . docusate sodium (COLACE) 100 MG capsule Take 100 mg by mouth daily.    . pioglitazone (ACTOS) 15 MG tablet Take 15 mg by mouth daily.    . polyethylene glycol powder (GLYCOLAX/MIRALAX) 17 GM/SCOOP powder Take 1 Container by mouth daily.    . pravastatin (PRAVACHOL) 80 MG tablet Take 80 mg by mouth at bedtime.  No current facility-administered medications for this visit.     PHYSICAL EXAMINATION: ECOG PERFORMANCE STATUS: 2 - Symptomatic, <50% confined to bed Vitals:   04/19/20 0942  BP: (!) 147/93  Pulse: 94  Resp: 18  Temp: (!) 96.9 F (36.1 C)  SpO2: 98%   Filed Weights   04/19/20 0942  Weight: 211 lb 12.8 oz (96.1 kg)    Physical Exam Constitutional:      General: He is not in acute distress.    Comments: Frail appearance  HENT:     Head: Normocephalic and atraumatic.  Eyes:     General: No scleral icterus.    Pupils: Pupils are equal, round, and reactive to light.  Cardiovascular:     Rate and Rhythm: Normal rate and regular rhythm.     Comments: Premature heartbeats Pulmonary:     Effort: Pulmonary effort is normal. No respiratory distress.     Breath sounds: No wheezing.     Comments: Decreased breath sound bilaterally. Abdominal:     General: Bowel sounds are normal. There is no distension.     Palpations: Abdomen is  soft. There is no mass.     Tenderness: There is no abdominal tenderness.  Musculoskeletal:        General: No deformity. Normal range of motion.     Cervical back: Normal range of motion and neck supple.  Skin:    General: Skin is warm and dry.     Findings: No erythema or rash.  Neurological:     Mental Status: He is alert and oriented to person, place, and time.     Cranial Nerves: No cranial nerve deficit.     Coordination: Coordination normal.  Psychiatric:        Mood and Affect: Mood normal.        Thought Content: Thought content normal.   Bilateral gynecomastia  LABORATORY DATA:  I have reviewed the data as listed Lab Results  Component Value Date   WBC 9.1 11/28/2019   HGB 13.1 11/28/2019   HCT 42.8 11/28/2019   MCV 93.2 11/28/2019   PLT 313 11/28/2019   Recent Labs    05/30/19 1236 08/30/19 0926 11/28/19 0934  NA 140 135 136  K 4.5 4.5 4.5  CL 110 105 104  CO2 19* 22 20*  GLUCOSE 158* 168* 172*  BUN 20 24* 24*  CREATININE 1.54* 1.74* 1.61*  CALCIUM 9.1 8.7* 9.2  GFRNONAA 42* 36* 40*  GFRAA 49* 42* 46*  PROT 7.7 8.3* 8.9*  ALBUMIN 3.8 3.6 3.6  AST 17 21 15   ALT 11 13 7   ALKPHOS 48 53 53  BILITOT 0.8 0.8 0.5   Iron/TIBC/Ferritin/ %Sat No results found for: IRON, TIBC, FERRITIN, IRONPCTSAT    RADIOGRAPHIC STUDIES: I have personally reviewed the radiological images as listed and agreed with the findings in the report.  CT Chest Wo Contrast  Result Date: 04/17/2020 CLINICAL DATA:  Lung cancer.  Restaging. EXAM: CT CHEST WITHOUT CONTRAST TECHNIQUE: Multidetector CT imaging of the chest was performed following the standard protocol without IV contrast. COMPARISON:  PET-CT 12/27/2019.  Chest CT 11/25/2019. FINDINGS: Cardiovascular: The heart size is normal. No substantial pericardial effusion. Coronary artery calcification is evident. Atherosclerotic calcification is noted in the wall of the thoracic aorta. Mediastinum/Nodes: 11 mm right paratracheal node  on 26/2 was 9 mm on previous PET-CT. Other scattered small right mediastinal lymph nodes are similar. Small subcarinal lymph nodes are slightly more prominent but unenlarged by CT criteria.  No left hilar lymphadenopathy on this noncontrast study. There is no axillary lymphadenopathy. Lungs/Pleura: Consolidative masslike opacity posterior right lung was measured on soft tissue windows on the previous PET-CT. Measuring on soft tissue windows today the lesion is similar at 7.6 x 5.9 cm compared to 7.8 x 5.5 cm previously. Centrilobular emphsyema noted. No new suspicious pulmonary nodule or mass. No pleural effusion. Upper Abdomen: Calcified gallstones again noted. Low-density lesions in the upper pole of each kidney are similar and likely cyst. Musculoskeletal: No worrisome lytic or sclerotic osseous abnormality. L1 superior endplate compression deformity is stable in the interval. IMPRESSION: 1. No substantial interval change in the consolidative masslike opacity in the posterior right lung. 2. Slight increase in size of a right paratracheal lymph node, now measuring 11 mm short axis, previously 9 mm. Attention on follow-up recommended. 3. Cholelithiasis. 4. Aortic Atherosclerosis (ICD10-I70.0) and Emphysema (ICD10-J43.9). Electronically Signed   By: Misty Stanley M.D.   On: 04/17/2020 13:46    ASSESSMENT & PLAN:  1. Squamous cell lung cancer, right ([place])   Cancer Staging Squamous cell lung cancer, right ([place]) Staging form: Lung, AJCC 8th Edition - Clinical stage from 04/20/2019: Stage IIA (cT2b, cN0, cM0) - Signed by Earlie Server, MD on 06/02/2019   # cT2b N0 M0 squamous cell lung cancer. Interval CT chest was reviewed by me and discussed with patient. Right upper lobe mass/consolidative opacity has not changed significantly in size.  Slightly increased paratracheal lymph node.  This mass was previously PET positive His case was previously discussed on tumor board.  PET activity was considered to be favoring  inflammation/infection. Given that the right upper lobe mass consolidation persist in size, I will refer patient back to see pulmonology. Patient feels stressed about seeing multiple doctors. I had a lengthy discussion with patient about goals of care.  If he prefers to not to proceed with any additional invasive diagnostic work-up, I recommend to repeat CT in 3 months.  Patient prefers to discuss with pulmonology first and then decide.  I also discuss his case on tumor board as well.  History of prostate cancer.  PSA was checked today and it remains less than 0.01.  Patient was recently seen by urology. Chronic gynecomastia.  Stable secondary to Casodex.    All questions were answered. The patient knows to call the clinic with any problems questions or concerns.  cc [name], MD   Orders Placed This Encounter  Procedures  . Ambulatory referral to Pulmonology    Referral Priority:   Routine    Referral Type:   Consultation    Referral Reason:   Specialty Services Required    Referred to Provider:   Ottie Glazier, MD    Requested Specialty:   Pulmonary Disease    Number of Visits Requested:   1    Return of visit:  To be determined  Earlie Server, MD, PhD Hematology Oncology [facility] Pager- [phone] 04/19/2020

## 2020-04-19 NOTE — Progress Notes (Signed)
Pt here for follow up. Reports constipation and was started on stool softner & miralax by Dr. Chancy Milroy.

## 2020-04-26 ENCOUNTER — Other Ambulatory Visit: Payer: Medicare Other

## 2020-04-26 NOTE — Progress Notes (Signed)
Tumor Board Documentation  Carlos Levine was presented by Dr Tasia Catchings at our Tumor Board on 04/26/2020, which included representatives from medical oncology, radiation oncology, internal medicine, navigation, pathology, radiology, surgical, pharmacy, genetics, research, palliative care, pulmonology.  Carlos Levine currently presents as a current patient, for discussion with history of the following treatments: active survellience, adjuvant radiation.  Additionally, we reviewed previous medical and familial history, history of present illness, and recent lab results along with all available histopathologic and imaging studies. The tumor board considered available treatment options and made the following recommendations: Active surveillance    The following procedures/referrals were also placed: No orders of the defined types were placed in this encounter.   Clinical Trial Status: not discussed   Staging used: AJCC Stage Group  AJCC Staging: T: 2b N: 0 M: 0 Group: Stage 2A Squamous Cell Lung Cancer   National site-specific guidelines NCCN were discussed with respect to the case.  Tumor board is a meeting of clinicians from various specialty areas who evaluate and discuss patients for whom a multidisciplinary approach is being considered. Final determinations in the plan of care are those of the provider(s). The responsibility for follow up of recommendations given during tumor board is that of the provider.   Today's extended care, comprehensive team conference, Carlos Levine was not present for the discussion and was not examined.   Multidisciplinary Tumor Board is a multidisciplinary case peer review process.  Decisions discussed in the Multidisciplinary Tumor Board reflect the opinions of the specialists present at the conference without having examined the patient.  Ultimately, treatment and diagnostic decisions rest with the primary provider(s) and the patient.

## 2020-05-01 ENCOUNTER — Inpatient Hospital Stay
Admission: RE | Admit: 2020-05-01 | Discharge: 2020-05-01 | Disposition: A | Discharge status: Home / Self Care | Payer: Medicare Other | Source: Ambulatory Visit

## 2020-05-01 NOTE — Pre-Procedure Instructions (Signed)
Progress Notes - documented in this encounter [name], NP - 03/28/2020 11:00 AM EDT Formatting of this note is different from the original. Established Patient Visit   Chief Complaint: Chief Complaint  Patient presents with  . Shortness of Breath  . 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 Cena Benton with Casodex secondary subsequent rise in PSA) presents today for 71-month follow-up. He was last seen in office by Dr. Nehemiah Massed on 09/23/2019. There were no changes in his medication regimen at this time.  Upon interview today he states that " heart wise" he "feels pretty good"; he initially mentions some right-sided soreness in his ribs/lateral chest wall where they performed a needle biopsy per the patient. This pain is not constant however he will notice it at times with movement especially while lying in bed at night. He also mentions that he has some right breast tissue tenderness to touch. Patient states that this gynecomastia was result of medications affiliated with his prostate cancer treatment.  Upon my review of systems he did endorse some chest tightness/tightness across his chest which primarily occurs while he is lying down at night. This does not seem to be affiliated with activity but it was difficult to characterize the exact nature of it during interview with patient. He states that the chest tightness is particularly noticeable with a deep breath and can be relieved by sitting up and breathing deeply. He endorses SOB/DOE that is particularly noticeable when he climbs  stairs or walks up a hill. This shortness of breath was particularly profound prior to cancer treatment but may be improving.  He denies any bilateral lower extremity swelling. Denies palpitations. He denies lightheadedness/dizziness or syncope/near syncope but states that he has "wobbles" and that this is affiliated with diabetes. He states that he can feel unsteady after standing for long periods of time and perhaps feels close to passing out but he has never actually passed out. He currently leads a very sedentary lifestyle and does not obtain intentional exercise. From a dietary standpoint he endorses a decreased appetite; he primarily snacks and only eats 1 meal per day. He takes all his medications as prescribed.  He does relate that his wife recently had a stroke. Also he is done with radiation treatment at this time and is being seen in follow-up. He endorses poor sleep and frequent urination at night. He is a former smoker but stopped smoking approximately 10 years ago. He does not check his blood pressure at home but it appears to be relatively well controlled at previous medical encounters.  He is currently being followed by oncology/Dr.Yu for his squamous cell lung cancer. He has a follow-up visit planned in 3 weeks with his office.  His primary care provider is Dr. Caryl Comes and he has a follow-up appointment in 2 weeks with his office. Last office visit with Dr. Caryl Comes was 01/11/2020.  Recent CMP on 01/04/2020 reveals a potassium of 4.9, a creatinine of 1.4, GFR 49, AST 11, ALT 6. Lipid panel from 01/04/2020 revealed a total cholesterol 139, triglycerides 119, HDL 46.6, LDL 69. Recent A1c on 01/04/2020 equals 7.5. ------------------------------------------------------------------------------------------  Echocardiogram from  05/29/2019:  ECHOCARDIOGRAM REPORT    Patient Name:  Carlos Levine Date of Exam: 05/27/2019  Medical Rec #: [id number]   Height:    70.0 in  Accession #:   [id number]   Weight:    214.7 lb  Date of Birth: 03-Feb-1941   BSA:     2.15 m  Patient Age:  46 years    BP:      160/88 mmHg  Patient Gender: M       HR:      54 bpm.  Exam Location: [facility]   Procedure: 2D Echo, Color Doppler, Cardiac Doppler and Intracardiac       Opacification Agent   Indications:  R94.31 Abnormal ECG   History:     Patient has prior history of Echocardiogram examinations. CKD          Risk Factors: Dyslipidemia and Diabetes.   Sonographer:   Charmayne Sheer RDCS (AE)  Referring Phys: [id number] SITAL MODY  Diagnosing Phys: Yolonda Kida MD   Sonographer Comments: Technically difficult study due to poor echo windows.  IMPRESSIONS   1. The left ventricle has normal systolic function, with an ejection fraction of 55-60%. The cavity size was mildly dilated. Left ventricular diastolic parameters were normal.  2. The right ventricle has normal systolic function. The cavity was normal. There is no increase in right ventricular wall thickness.  3. The aorta is normal in size and structure.  4. The interatrial septum was not assessed.   FINDINGS  Left Ventricle: The left ventricle has normal systolic function, with an ejection fraction of 55-60%. The cavity size was mildly dilated. There is no increase in left ventricular wall thickness. Left ventricular diastolic parameters were normal.  Definity contrast agent was given IV to delineate the left ventricular endocardial borders.   Right Ventricle: The right ventricle has normal systolic function. The cavity was normal. There is no increase in right ventricular wall thickness.   Left Atrium: Left atrial size was normal in size.   Right Atrium: Right atrial size was normal in size. Right atrial pressure is estimated at 10 mmHg.   Interatrial Septum: The interatrial septum was not assessed.   Pericardium: There is no evidence of pericardial effusion.    Mitral Valve: The mitral valve is normal in structure. Mitral valve regurgitation is not visualized by color flow Doppler.   Tricuspid Valve: The tricuspid valve is normal in structure. Tricuspid valve regurgitation was not visualized by color flow Doppler.   Aortic Valve: The aortic valve is normal in structure. Aortic valve regurgitation was not visualized by color flow Doppler.   Pulmonic Valve: The pulmonic valve was grossly normal. Pulmonic valve regurgitation is not visualized by color flow Doppler.   Aorta: The aorta is normal in size and structure.   +--------------+-------++    +----------------+------++  LEFT VENTRICLE        Biplane EF (MOD)     +--------------+-------++    +----------------+------++  PLAX 2D            LV Biplane EF: 59.6 %  +--------------+-------++    +----------------+------++  LVIDd:    4.66 cm  +--------------+-------++    +----------------+---------++  LVIDs:    3.81 cm    Diastology          +--------------+-------++    +----------------+---------++  LV PW:    1.12 cm    LV e' lateral: 9.36 cm/s  +--------------+-------++    +----------------+---------++  LV IVS:    0.89  cm    LV E/e' lateral:11.0     +--------------+-------++    +----------------+---------++  LV SV:    38 ml     LV e' medial:  6.09 cm/s  +--------------+-------++    +----------------+---------++  LV SV Index: 17.12     LV E/e' medial: 16.9     +--------------+-------++    +----------------+---------++               +--------------+-------++   +------------------+---------++  LV Volumes (MOD)        +------------------+---------++  LV area d, A2C:  34.80 cm  +------------------+---------++  LV area d, A4C:  34.80 cm  +------------------+---------++  LV area s, A2C:  20.70 cm   +------------------+---------++  LV area s, A4C:  21.10 cm  +------------------+---------++  LV major d, A2C: 9.41 cm   +------------------+---------++  LV major d, A4C: 9.08 cm   +------------------+---------++  LV major s, A2C: 8.36 cm   +------------------+---------++  LV major s, A4C: 8.07 cm   +------------------+---------++  LV vol d, MOD A2C:108.0 ml   +------------------+---------++  LV vol d, MOD A4C:109.0 ml   +------------------+---------++  LV vol s, MOD A2C:42.0 ml   +------------------+---------++  LV vol s, MOD A4C:45.6 ml   +------------------+---------++  LV SV MOD A2C:  66.0 ml   +------------------+---------++  LV SV MOD A4C:  109.0 ml   +------------------+---------++  LV SV MOD BP:   65.8 ml   +------------------+---------++   +---------------+-------++-----------++  LEFT ATRIUM      Index     +---------------+-------++-----------++  LA diam:    3.50 cm1.63 cm/m   +---------------+-------++-----------++  LA Vol (A2C): 35.6 ml16.55 ml/m  +---------------+-------++-----------++  LA Vol (A4C): 61.2 ml28.45 ml/m  +---------------+-------++-----------++  LA Biplane Vol:50.3 ml23.38 ml/m  +---------------+-------++-----------++  +--------------+-----------++  MITRAL VALVE         +--------------+-----------++  MV Area (PHT):1.83 cm    +--------------+-----------++  MV Peak grad: 6.9 mmHg    +--------------+-----------++  MV Mean grad: 3.0 mmHg    +--------------+-----------++  MV Vmax:   1.31 m/s    +--------------+-----------++  MV Vmean:   72.5 cm/s   +--------------+-----------++  MV VTI:    0.52 m     +--------------+-----------++  MV PHT:    120.35 msec  +--------------+-----------++  MV Decel Time:415 msec    +--------------+-----------++   +--------------+-----------++  MV E velocity:103.00 cm/s  +--------------+-----------++  MV A velocity:125.00 cm/s  +--------------+-----------++  MV E/A ratio: 0.82      +--------------+-----------++   -----------------------------------------------------------------------------------------  Past Medical and Surgical History  Past Medical History Past Medical History:  Diagnosis Date  . Constipation  . Dermatophytosis of nail  . Essential hypertension, benign  . History of prostate cancer  . Pure hypercholesterolemia  . PVD (peripheral vascular disease) (CMS-HCC)  stent placed left common iliac; stent placed left external iliac- 2014 Dr. Delana Meyer  . Type II or unspecified type diabetes mellitus without mention of complication, not stated as uncontrolled (CMS-HCC) 1995   Past Surgical History He has a past surgical history that includes Posterior fusion lumbar spine (1970's); Amputation finger / thumb (Right); and prostate surgery.   Medications and Allergies  Current Medications  Current Outpatient Medications  Medication Sig Dispense Refill  . bicalutamide (CASODEX) 50 MG tablet Take 50 mg by mouth once daily  . blood glucose diagnostic test strip 1 each (1 strip total) by XX route 2 (two) times daily One Touch Ultra Blue Use as instructed. Dx E11.22 200 each 3  . diltiazem (CARDIZEM  CD) 120 MG XR capsule Take 1 capsule (120 mg total) by mouth once daily 30 capsule 11  . diltiazem (CARDIZEM) 30 MG tablet Take 1 tablet (30 mg total) by mouth 4 (four) times daily as needed 30 tablet 11  . pioglitazone (ACTOS) 15 MG tablet TAKE 1 TABLET (15 MG TOTAL) BY MOUTH ONCE DAILY FOR 180 DAYS 90 tablet 1  . pravastatin (PRAVACHOL) 80 MG tablet TAKE 1 TABLET BY MOUTH EVERY DAY AT NIGHT 90 tablet 1  . albuterol 90 mcg/actuation inhaler Inhale 2 inhalations into the lungs 4 (four) times daily (Patient not taking: Reported on 03/28/2020 ) 1 inhalation 3   No current  facility-administered medications for this visit.   Allergies: Iodinated contrast media, Ace inhibitors, and Metrizamide  Social and Family History  Social History reports that he quit smoking about 9 years ago. His smoking use included cigarettes. He has a 43.50 pack-year smoking history. He has never used smokeless tobacco. He reports that he does not drink alcohol and does not use drugs.  Family History Family History  Problem Relation Age of Onset  . Cancer Mother  . Obesity Mother  . High blood pressure (Hypertension) Mother  . Pancreatic cancer Father  . Coronary Artery Disease (Blocked arteries around heart) Neg Hx  . Brain cancer Neg Hx  . Kidney cancer Neg Hx   Review of Systems   Review of Systems: The patient denies orthopnea, paroxysmal nocturnal dyspnea, pedal edema, palpitations, heart racing, presyncope, syncope. Review of 12 Systems is negative except as described above.  Physical Examination   Vitals:BP 120/70 (BP Location: Left upper arm, Patient Position: Sitting, BP Cuff Size: Adult)  Pulse 99  Resp 15  Ht 174 cm (5' 8.5")  Wt 94.7 kg (208 lb 12.8 oz)  SpO2 97%  BMI 31.29 kg/m  Ht:174 cm (5' 8.5") Wt:94.7 kg (208 lb 12.8 oz) BJY:NWGN surface area is 2.14 meters squared. Body mass index is 31.29 kg/m.  General: Alert and oriented. Well-appearing. No acute distress. HEENT: Pupils equally reactive to light and accomodation  Neck: Supple, carotid pulses 2+. No JVD Lungs: Normal effort of breathing; clear to auscultation bilaterally; no wheezes, rales, rhonchi or crackles. Chest expansion symmetrical Heart: Regular rate and rhythm. No murmur, rub, or gallop Abdomen: soft nontender, nondistended, with normal bowel sounds Extremities: no cyanosis, clubbing, or edema Peripheral Pulses: 2+ radial bilaterally Skin: Warm, dry, no diaphoresis, color appropriate for ethnicity  Assessment   79 y.o. male with  1. SOB (shortness of breath)  2. DOE (dyspnea on  exertion)  3. Chest tightness  4. Chest pain at rest  5. Benign essential hypertension  6. SVT (supraventricular tachycardia) (CMS-HCC)  7. Coronary artery disease involving native coronary artery of native heart, unspecified whether angina present  8. Squamous cell lung cancer, right (CMS-HCC)  9. Stage 3a chronic kidney disease (CMS-HCC)  10. Pure hypercholesterolemia   Plan   1. Shortness of breath/dyspnea on exertion/chest tightness Given his his symptoms of marked shortness of breath and dyspnea on exertion, chest tightness with difficult to discern etiology along with recent radiation treatment to the chest, it is prudent to evaluate the heart from a structural and functional standpoint with an echocardiogram to determine if there is been any treatment induced cardiomyopathy. His shortness of breath and chest tightness could be suggestive of ischemia and therefore a pharmaceutical myocardial perfusion study is warranted as well. Patient is agreeable to this. -Obtain echocardiogram -Obtain pharmaceutical myocardial perfusion study.  2. Benign essential  hypertension. Blood pressure in office today is well controlled at 120/70. Recent CMP on 01/04/2020 reveals a potassium of 4.9, a creatinine of 1.4, GFR 49. He is currently taking diltiazem 120 mg XR capsule oral daily. The patient has not had to take any prn doses of diltiazem per patient report. Blood pressure well controlled on previous medical encounters. Continue current medication regimen.  3. Coronary artery disease involving native coronary artery. Noted on CAT scan 2020. Given his his symptoms of marked shortness of breath and dyspnea on exertion, chest tightness with difficult to discern etiology along with recent radiation treatment to the chest, prudent to evaluate the heart from a structural and functional standpoint with an echocardiogram to determine if there is been any treatment induced cardiomyopathy. His shortness of breath  and chest tightness could be suggestive of ischemia and therefore a pharmaceutical myocardial perfusion study is warranted as well. Patient is agreeable to this. -Obtain echocardiogram -Obtain pharmaceutical myocardial perfusion study.  4. Pure hypercholesterolemia. Lipid panel from 01/04/2020 revealed a total cholesterol 139, triglycerides 119, HDL 46.6, LDL 69. Lipid panel well controlled on pravastatin 80 mg tablet. LFT on 01/04/2020 revealed AST 11, ALT 6. Continue current statin therapy.  5. SVT. This appears to be stable at this visit with no complaints of palpitations, heart racing, skipping beats.   6. Stage IIIa chronic kidney disease. Continue with blood pressure control on current regimen. Recent CMP on 01/04/2020 reveals a potassium of 4.9, a creatinine of 1.4, GFR 49.  Return in about 2 months (around 05/28/2020).  I personally performed the service, non-incident to. (WP)   NATHAN Raymon Mutton, NP   [name], NP   This dictation was prepared with dragon dictation. Any transcription errors that result from this process are unintentional.    Electronically signed by [name], NP at 03/28/2020 6:30 PM EDT  Plan of Treatment - documented as of this encounter Upcoming Encounters Upcoming Encounters  Date Type Specialty Care Team Description  05/09/2020 Ancillary Procedure Cardiology Flossie Dibble, MD  [address]  [facility]  [place], [place] [address]  [phone]  [phone] (Fax)    05/09/2020 Ancillary Procedure Cardiology Flossie Dibble, MD  [address]  [facility]  [place], [place] [address]  [phone]  [phone] ([phone]    05/14/2020 Office Visit Cardiology Flossie Dibble, MD  [address]  [facility]  [place], [place] [address]  [phone]  [phone] ([address]    [name], NP  [address]   [place], [place] [address]  [phone]  [phone]    05/23/2020 Office Visit Pulmonary Ottie Glazier, MD  [place], [place] [address]  [phone]  [phone] (Fax)    08/06/2020 Ancillary Orders Lab [name], MD  [address]  [facility] [place], [place] [address]  [phone]  [phone]    08/13/2020 Office Visit Internal Medicine [name], [address]  [facility] [place],  [address]  [phone]  [phone] (Fax)     Scheduled Orders Scheduled Orders  Name Type Priority Associated Diagnoses Order Schedule  Echo complete Echocardiography Routine SOB (shortness of breath)  DOE (dyspnea on exertion)  Chest tightness  1 Occurrences starting 03/28/2020 until 03/29/2021  NM myocardial perfusion SPECT multiple (stress and rest) Imaging Routine DOE (dyspnea on exertion)  Chest tightness  1 Occurrences starting 03/28/2020 until 03/28/2021  ECG  stress test only ECG Routine DOE (dyspnea on exertion)  Chest tightness  1 Occurrences starting 03/28/2020 until 03/28/2021  Goals - documented as of this encounter Goal Patient Goal Type Associated Problems Recent Progress Patient-Stated? Author  Lose Weight  Weight  On track (07/08/2019 2:38 PM EDT) Yes Heritage, Maudie Mercury, RMA  Note:   Formatting of this note might be different from the original. What: Lose weight  When:  Where:  How:   Importance: 5     Visit Diagnoses - documented in this encounter Diagnosis  SOB (shortness of breath) - Primary  Shortness of breath   DOE (dyspnea on exertion)  Other dyspnea and respiratory abnormality   Chest tightness  Other chest pain   Chest pain at rest  Unspecified chest pain   Benign essential hypertension  Essential hypertension, benign   SVT (supraventricular tachycardia) (CMS-HCC)  Other specified cardiac dysrhythmias   Coronary  artery disease involving native coronary artery of native heart, unspecified whether angina present   Squamous cell lung cancer, right (CMS-HCC)   Stage 3a chronic kidney disease (CMS-HCC)   Pure hypercholesterolemia   Images Patient Demographics  Patient Address Communication Language Race / Ethnicity Marital Status  [place] [address], [place] [address] [phone] [facility]) [phone] (Mobile) [email or web address] English (Preferred) White / Not Hispanic or Latino Married  Patient Contacts  Contact Name Contact Address Communication Relationship to Patient  Jevante [place], [place] [address] [phone] (Home) Spouse, Emergency Contact  Document Information  Primary Care Provider Other Service Providers Document Coverage Dates  [name], MD (May 19, 2020May 19, 2020 - Present) [phone] (Work) [phone] (Fax) [facility] [place], [place] [address] Internal Medicine [facility] [place], [place] [address]  Jun. 09, 2021June 09, Leola Amory [address] [place], [place] [address]   Encounter Providers Encounter Date  [name], NP (Attending) [phone] ([phone] (Fax) [address] [place],  [address] Cardiovascular Disease Jun. 09, 2021June 09, 2021    Show All Sections

## 2020-05-03 ENCOUNTER — Other Ambulatory Visit: Payer: Self-pay

## 2020-05-03 ENCOUNTER — Other Ambulatory Visit: Payer: Self-pay | Admitting: Oncology

## 2020-05-03 ENCOUNTER — Encounter
Admission: RE | Admit: 2020-05-03 | Discharge: 2020-05-03 | Disposition: A | Discharge status: Home / Self Care | Payer: Medicare Other | Source: Ambulatory Visit | Attending: Pulmonary Disease | Admitting: Pulmonary Disease

## 2020-05-03 HISTORY — DX: Cardiac arrhythmia, unspecified: I49.9

## 2020-05-03 HISTORY — DX: Dyspnea, unspecified: R06.00

## 2020-05-03 HISTORY — DX: Peripheral vascular disease, unspecified: I73.9

## 2020-05-03 HISTORY — DX: Essential (primary) hypertension: I10

## 2020-05-03 HISTORY — DX: Atherosclerotic heart disease of native coronary artery without angina pectoris: I25.10

## 2020-05-03 HISTORY — DX: Personal history of irradiation: Z92.3

## 2020-05-03 NOTE — Patient Instructions (Addendum)
INSTRUCTIONS FOR SURGERY     Your surgery is scheduled for:   Friday, July 23RD     To find out your arrival time for the day of surgery,          please call [phone] between 1 pm and 3 pm on :  Thursday, July 22ND     When you arrive for surgery, report to the [facility].       Do NOT stop on the first floor to register.    REMEMBER: Instructions that are not followed completely may result in serious medical risk,  up to and including death, or upon the discretion of your surgeon and anesthesiologist,            your surgery may need to be rescheduled.  __X__ 1. Do not eat food after midnight the night before your procedure.                    No gum, candy, lozenger, tic tacs, tums or hard candies.                  ABSOLUTELY NOTHING SOLID IN YOUR MOUTH AFTER MIDNIGHT                    You may drink unlimited clear liquids up to 2 hours before you are scheduled to arrive for surgery.                   Do not drink anything within those 2 hours unless you need to take medicine, then take the                   smallest amount you need.  Clear liquids include:  water, apple juice without pulp,                   any flavor Gatorade, Black coffee, black tea.  Sugar may be added but no dairy/ honey /lemon.                        Broth and jello is not considered a clear liquid.  __x__  2. On the morning of surgery, please brush your teeth with toothpaste and water. You may rinse with                  mouthwash if you wish but DO NOT SWALLOW TOOTHPASTE OR MOUTHWASH  __X___3. NO alcohol for 24 hours before or after surgery.  __x___ 4.  Do NOT smoke or use e-cigarettes for 24 HOURS PRIOR TO SURGERY.                      DO NOT Use any chewable tobacco products for at least 6 hours prior to surgery.  __x___ 5. If you start any new medication after this appointment and prior to surgery, please                    Bring it with you on the day of surgery.  ___x__ 6. Notify your doctor if there is any change in  your medical condition, such as fever,                  infection, vomitting, diarrhea or any open sores.  __x___ 7.  USE ANTIBACTERIAL SOAP as instructed, the night before surgery OR the day of surgery.                   Once you have washed with this soap, do NOT use any of the following: Powders, perfumes                    or lotions. Please do not wear make up, hairpins, clips or nail polish. You MAY wear deodorant.                   Men may shave their face and neck.  Women need to shave 48 hours prior to surgery.                   DO NOT wear ANY jewelry on the day of surgery. If there are rings that are too tight to                    remove easily, please address this prior to the surgery day. Piercings need to be removed.                                                                     NO METAL ON YOUR BODY.                    Do NOT bring any valuables.  If you came to Pre-Admit testing then you will not need license,                     insurance card or credit card.  If you will be staying overnight, please either leave your things in                     the car or have your family be responsible for these items.                     [facility] IS NOT RESPONSIBLE FOR BELONGINGS OR VALUABLES.  ___X__ 8. DO NOT wear contact lenses on surgery day.  You may not have dentures,                     Hearing aides, contacts or glasses in the operating room. These items can be                    Placed in the Recovery Room to receive immediately after surgery.  __x___ 9. IF YOU ARE SCHEDULED TO GO HOME ON THE SAME DAY, YOU MUST                   Have someone to drive you home and to stay with you  for the first 24 hours.                    Have an arrangement prior to arriving on surgery day.  ___x__ 10. Take the following medications on the morning  of surgery with a sip of water:                               1. CASODEX                     2. DILTIAZEM                     3. TRELEGY ELLIPTA INHALER                     4.                    _____ 11.  Follow any instructions provided to you by your surgeon.                        Such as enema, clear liquid bowel prep  __X__  12. STOP ALL ASPIRIN PRODUCTS 1 WEEK PRIOR TO SURGERY.                       THIS INCLUDES BC POWDERS / GOODIES POWDER  __x___ 13. STOP Anti-inflammatories as oF 1 WEEK PRIOR TO SURGERY.                      This includes IBUPROFEN / MOTRIN / ADVIL / ALEVE/ NAPROXYN                    YOU MAY TAKE TYLENOL ANY TIME PRIOR TO SURGERY.  _X____ 14.  Stop supplements until after surgery.                     This includes: N-ACETYL-L-CYSTEINE                 You may continue taking Vitamin B12 / Vitamin D3 but do not take on the morning of surgery.  __X____16.  CONTINUE TAKING ACTOS, BUT DO NOT TAKE ON MORNING OF SURGERY.                         Do NOT take any diabetes medications on surgery day.  ______17.  Continue to take the following medications but do not take on the morning of surgery:   __X____18.   Wear clean and comfortable clothing to the hospital.  [place] PHONE NUMBERS OF YOUR DRIVER AND OF THE PERSON THAT    YOU WANT DR. ALESKEROV TO CONTACT AFTER BRONCHOSCOPY.

## 2020-05-03 NOTE — Pre-Procedure Instructions (Signed)
Spoke with patient over phone today regarding preop medical information and instructions for upcoming procedure.  He is scheduled for a Stress Test and ECHO on 05/09/20. Pending those results will determine if he is cleared for surgery. Will send request for clearance to Dr. Nehemiah Massed.  Patient states that he took last dose of steroids and antibiotics last night and feels so much better.  His cough is manageable and he is not producing excessive amounts of phlegm. His cough is due to prior radiation. He has had both of his vaccines (Moderna).

## 2020-05-07 ENCOUNTER — Encounter: Payer: Self-pay | Admitting: Urgent Care

## 2020-05-09 ENCOUNTER — Other Ambulatory Visit: Payer: Self-pay

## 2020-05-09 ENCOUNTER — Other Ambulatory Visit
Admission: RE | Admit: 2020-05-09 | Discharge: 2020-05-09 | Disposition: A | Discharge status: Home / Self Care | Payer: Medicare Other | Source: Ambulatory Visit | Attending: Pulmonary Disease | Admitting: Pulmonary Disease

## 2020-05-09 DIAGNOSIS — Z01812 Encounter for preprocedural laboratory examination: Secondary | ICD-10-CM | POA: Diagnosis present

## 2020-05-09 DIAGNOSIS — Z20822 Contact with and (suspected) exposure to covid-19: Secondary | ICD-10-CM | POA: Insufficient documentation

## 2020-05-09 LAB — SARS CORONAVIRUS 2 (TAT 6-24 HRS): SARS Coronavirus 2: NEGATIVE

## 2020-05-09 LAB — APTT: aPTT: 34 seconds (ref 24–36)

## 2020-05-09 LAB — PROTIME-INR
INR: 1 (ref 0.8–1.2)
Prothrombin Time: 12.6 seconds (ref 11.4–15.2)

## 2020-05-11 ENCOUNTER — Ambulatory Visit: Admission: RE | Admit: 2020-05-11 | Payer: Medicare Other | Source: Home / Self Care

## 2020-05-11 ENCOUNTER — Encounter: Admission: RE | Payer: Self-pay | Source: Home / Self Care

## 2020-05-11 SURGERY — VIDEO BRONCHOSCOPY WITH ENDOBRONCHIAL NAVIGATION
Anesthesia: General

## 2020-05-11 MED ORDER — ROCURONIUM BROMIDE 10 MG/ML (PF) SYRINGE
PREFILLED_SYRINGE | INTRAVENOUS | Status: AC
  Filled 2020-05-11: qty 10

## 2020-05-11 MED ORDER — SODIUM CHLORIDE (PF) 0.9 % IJ SOLN
INTRAMUSCULAR | Status: AC
  Filled 2020-05-11: qty 10

## 2020-05-11 MED ORDER — PROPOFOL 10 MG/ML IV BOLUS
INTRAVENOUS | Status: AC
  Filled 2020-05-11: qty 20

## 2020-05-11 NOTE — Progress Notes (Signed)
  [facility] Perioperative Services: Pre-Admission/Anesthesia Testing     Date: 05/11/20  Name: Carlos Levine MRN:   [id number]  Re: Pending cardiac clearance  Patient is pending cardiac clearance. He had TTE and stress test on 05/09/2020; results pending. I have spoken with both Dr. Lanney Gins (pulmonology) and Dr. Nehemiah Massed (cardiology) this morning regarding this patient. He is scheduled to see cardiology in follow up consult on 05/14/2020 to review testing results. Dr. Lanney Gins ok with postponing procedure. I have spoken with [facility] pulmonology Caryl Pina) to make them aware of the aforementioned. Pulmonologist has contacted the patient personally to make him aware that his procedure is being postponed pending cardiac clearance. SDS charge nurse notified.   Honor Loh, MSN, APRN, FNP-C, CEN [facility]  Peri-operative Services Nurse Practitioner Phone: [phone] 05/11/20 9:13 AM

## 2020-05-16 NOTE — Progress Notes (Addendum)
[facility] Perioperative Services  Pre-Admission/Anesthesia Testing Clinical Review  Date: 05/16/20  Patient Demographics:  Name: Carlos Levine DOB:   05/07/1941 MRN:   [id number]  Planned Surgical Procedure(s):    Case: [id number] Date/Time: 05/28/20 0715   Procedures:      VIDEO BRONCHOSCOPY WITH ENDOBRONCHIAL ULTRASOUND (N/A )     VIDEO BRONCHOSCOPY WITH ENDOBRONCHIAL NAVIGATION (N/A )   Anesthesia type: General   Pre-op diagnosis: Lung Mass R91.8   Location: [place] PROCEDURE RM 02 / [facility] ORS FOR ANESTHESIA GROUP   Surgeons: Ottie Glazier, MD     NOTE: Available PAT nursing documentation and vital signs have been reviewed. Clinical nursing staff has updated patient's PMH/PSHx, current medication list, and drug allergies/intolerances to ensure comprehensive history available to assist in medical decision making as it pertains to the aforementioned surgical procedure and anticipated anesthetic course.   Clinical Discussion:  Carlos Levine is a 79 y.o. male who is submitted for pre-surgical anesthesia review and clearance prior to him undergoing the above procedure. Patient is a Former Smoker (43.5 pack years, quit 10/2009). Pertinent PMH includes: CAD, HLD, HTN, dysrhythmia (SVT), T2DM, CKD-III, PVD, DOE, prostate cancer (s/p cryoablation; on bicalutamide), lung cancer (s/p XRT in 2020), lumbar stenosis with DDD.   Patient is followed by cardiology Carlos Massed, MD). He was last seen in the cardiology clinic on 03/28/2020; notes reviewed. At that time, patient reports that "heart wise he felt pretty good". Patient complained of intermittent chest tightness when supine only; non-exertional. Symptoms relieved by "sitting up and breathing deeply". Patient notes DOE with climbing stairs and walking up hills. Breathing has improved overall with his cancer treatment (XRT). No peripheral edema, palpitations, or syncope/presyncope episodes. Appetite decreased; snacking and  eating a single meal/day. Last echocardiogram in 05/2019 revealed an LVEF of 55-60%. Given his recent XRT treatments, cardiologist opting to repeat echocardiogram, alone with a myocardial perfusion study, to assess for treatment induced cardiomyopathy. Repeat echocardiogram demonstrated an LVEF of 50% with mild AV stenosis. Repeat Lexiscan showed inferior all hypokinesis with an LVEF of 49% (see below for full results). Discussed need for anticoagulation, however will defer until after planned bronchoscopy. RCRI = class II. Following his RTC visit on 05/14/2020, per cardiology, patient is "optimized from a cardiovascular standpoint for bronchoscopy and can proceed".   Patient being followed by oncology Carlos Catchings, MD) for lung cancer. His last visit in the cancer center was on 04/19/2020; notes reviewed. Recent CT imaging of the chest revealed interval stability of her known RUL lung mass; measures 7.5 x 5.9 cm (s/p SBRT delivering 6000 cGy over 5 fractions). Area remained hypermetabolic on subsequent PET imaging. When discussed at multidisciplinary tumor board, inflammation/infection was suspected. Repeat imaging did reveal interval enlargement of a RIGHT paratracheal node from 9 to 11 mm in size. Of note, patient was initially to undergo ENB/EBUS last year, however procedure was aborted when he was found to be in SVT. Pulmonology, medical oncology, and radiation oncology elected to pursue SBRT course with a curative intent. With recent stability of heart rhythms, patient was referred back to pulmonology for consideration of ENB/EBUS at this time.   He denies previous intra-operative complications with anesthesia. This patient is not on daily anticoagulation therapy.  Vitals with BMI 05/03/2020 04/19/2020 11/28/2019  Height 5' 8.5" - -  Weight 204 lbs 211 lbs 13 oz 215 lbs 5 oz  BMI 64.33 - 29.51  Systolic - 884 166  Diastolic - 93 77  Pulse - 94  103    Providers/Specialists:   PROVIDER ROLE LAST Carlos Grimes, MD Pulmonology (Surgeon) 04/25/2020  [name], MD Primary Care Provider 04/12/2020  Serafina Royals, MD Cardiology 05/14/2020  Earlie Server, MD Oncology 04/19/2020  John Giovanni, MD Urology 11/14/2019   Allergies:  Contrast media [iodinated diagnostic agents] and Ace inhibitors  Current Home Medications:   No current facility-administered medications for this encounter.   . bicalutamide (CASODEX) 50 MG tablet  . diltiazem (CARDIZEM CD) 120 MG 24 hr capsule  . docusate sodium (COLACE) 100 MG capsule  . pioglitazone (ACTOS) 15 MG tablet  . polyethylene glycol (MIRALAX / GLYCOLAX) 17 g packet  . pravastatin (PRAVACHOL) 80 MG tablet  . TRELEGY ELLIPTA 100-62.5-25 MCG/INH AEPB  . Acetylcysteine (N-ACETYL-L-CYSTEINE) 600 MG CAPS   History:   Past Medical History:  Diagnosis Date  . Arthritis   . Cancer [facility])    prostate  . Chronic kidney disease   . Coronary artery disease   . Diabetes mellitus without complication ([place])   . Dyspnea    with minimal exertion  . Dysrhythmia    history of SVT  . History of kidney stones   . History of radiation therapy    lung  . Hyperlipidemia   . Hypertension   . Peripheral vascular disease ([place])   . Prostate cancer ([place])   . 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 AMPUTATION FINGER / THUMB Right   . [place]   removed 2 discs and fused others  . PROSTATE CRYOABLATION    . SPINAL FUSION    . STENT PLACEMENT ILIAC ([facility] HX) Left 2014   Left common iliac and left external iliac by Dr. Delana Meyer  . VIDEO BRONCHOSCOPY WITH ENDOBRONCHIAL NAVIGATION N/A 05/30/2019   Procedure: VIDEO BRONCHOSCOPY WITH ENDOBRONCHIAL NAVIGATION, DIABETIC;  Surgeon: Ottie Glazier, MD;  Location: [facility] ORS;  Service: Thoracic;  Laterality: N/A;  . VIDEO BRONCHOSCOPY WITH ENDOBRONCHIAL ULTRASOUND N/A 05/30/2019   Procedure: VIDEO  BRONCHOSCOPY WITH ENDOBRONCHIAL ULTRASOUND, DIABETIC;  Surgeon: Ottie Glazier, MD;  Location: [facility] ORS;  Service: Thoracic;  Laterality: N/A;   Family History  Problem Relation Age of Onset  . Lung cancer Father   . Pancreatic cancer Father   . Brain cancer Mother   . Stomach cancer Paternal Aunt   . Stomach cancer Paternal Uncle   . Cancer Maternal Grandmother   . Kidney cancer Maternal Grandfather    Social History   Tobacco Use  . Smoking status: Former Smoker    Years: 30.00    Types: Cigarettes    Quit date: 10/20/2009    Years since quitting: 10.5  . Smokeless tobacco: Never Used  Vaping Use  . Vaping Use: Never used  Substance Use Topics  . Alcohol use: No  . Drug use: No    Pertinent Clinical Results:  LABS: Labs reviewed: Acceptable for surgery.       aPTT 05/09/2020 34  24 - 36 seconds Final  Performed at [facility], [address] [place], [place] [address]  Prothrombin Time 05/09/2020 12.6  11.4 - 15.2 seconds Final  INR 05/09/2020 1.0  0.8 - 1.2 Final  Comment: (NOTE) INR goal varies based on device and disease states. Performed at [facility], [address] [place], [place] [address]     EKG: Date: 05/10/2020 Rate: 69 bpm Rhythm:  Atrial fibrillation NOTE: Tracing obtained at [facility]; unable for review. Above based on cardiologist's interpretation.    IMAGING / PROCEDURES: LEXISCAN done on 05/10/2020 1. LVEF= 49%  2. Regional wall motion:  demonstrates hypokinesis of the inferior wall.  3. The overall quality of the study is fair.    4. Artifacts noted: yes  5. Left ventricular cavity: normal.   ECHOCARDIOGRAM done on 05/09/2020 1. LVEF 50% 2. Normal LV systolic function 3. RV inadequately seen 4. Mild MV insufficiency 5. Mild AV stenosis (mean gradient 16.4 mmHg) 6. Mild LVH 7. Technically difficult study; limited views; poor sound transmission  CT CHEST W/O CONTRAST done on 04/17/2020 1. No  substantial interval change in the consolidative mass like opacity in the posterior right lung. 2. Slight increase in size of a right paratracheal lymph node, now measuring 11 mm short axis, previously 9 mm. Attention on follow-up recommended. 3. Cholelithiasis. 4. Aortic Atherosclerosis and emphysema.  PET SCAN done on 12/27/2019 1. Intensely hypermetabolic consolidation in the RIGHT upper lobe is favored inflammation or infection. Central hypermetabolic activity towards the hilar aspect of the mass could represent residual carcinoma. Recommend follow-up contrast CT. 2. No evidence of mediastinal metastatic nodal disease. 3. No evidence distant metastatic disease  Impression and Plan:  LARNIE HEART has been referred for pre-anesthesia review and clearance prior him undergoing the planned anesthetic and procedural courses. Available labs, pertinent testing, and imaging results were personally reviewed by me. This patient has been appropriately cleared by cardiology.   Based on clinical review performed today (05/16/20), barring and significant acute changes in the patient's overall condition, it is anticipated that he will be able to proceed with the planned surgical intervention. Any acute changes in clinical condition may necessitate his procedure being postponed and/or cancelled. Pre-surgical instructions were reviewed with the patient during his PAT appointment and questions were fielded by PAT clinical staff.  Honor Loh, MSN, APRN, FNP-C, CEN [facility]  Peri-operative Services Nurse Practitioner Phone: [phone] 05/16/20 8:48 AM  NOTE: This note has been prepared using Dragon dictation software. Despite my best ability to proofread, there is always the potential that unintentional transcriptional errors may still occur from this process.

## 2020-05-24 ENCOUNTER — Other Ambulatory Visit
Admission: RE | Admit: 2020-05-24 | Discharge: 2020-05-24 | Disposition: A | Discharge status: Home / Self Care | Payer: Medicare Other | Source: Ambulatory Visit | Attending: Pulmonary Disease | Admitting: Pulmonary Disease

## 2020-05-24 ENCOUNTER — Other Ambulatory Visit: Payer: Self-pay

## 2020-05-24 DIAGNOSIS — Z20822 Contact with and (suspected) exposure to covid-19: Secondary | ICD-10-CM | POA: Diagnosis not present

## 2020-05-24 DIAGNOSIS — Z01812 Encounter for preprocedural laboratory examination: Secondary | ICD-10-CM | POA: Insufficient documentation

## 2020-05-25 ENCOUNTER — Other Ambulatory Visit: Payer: Medicare Other

## 2020-05-25 LAB — SARS CORONAVIRUS 2 (TAT 6-24 HRS): SARS Coronavirus 2: NEGATIVE

## 2020-05-28 ENCOUNTER — Encounter: Payer: Self-pay | Admitting: *Deleted

## 2020-05-28 ENCOUNTER — Encounter
Admission: RE | Disposition: A | Discharge status: Home / Self Care | Payer: Self-pay | Source: Home / Self Care | Attending: Pulmonary Disease

## 2020-05-28 ENCOUNTER — Other Ambulatory Visit: Payer: Self-pay

## 2020-05-28 ENCOUNTER — Ambulatory Visit: Payer: Medicare Other | Admitting: Urgent Care

## 2020-05-28 ENCOUNTER — Ambulatory Visit: Admission: RE | Admit: 2020-05-28 | Payer: Medicare Other | Source: Ambulatory Visit

## 2020-05-28 ENCOUNTER — Ambulatory Visit
Admission: RE | Admit: 2020-05-28 | Discharge: 2020-05-28 | Disposition: A | Discharge status: Home / Self Care | Payer: Medicare Other | Attending: Pulmonary Disease | Admitting: Pulmonary Disease

## 2020-05-28 DIAGNOSIS — E785 Hyperlipidemia, unspecified: Secondary | ICD-10-CM | POA: Diagnosis not present

## 2020-05-28 DIAGNOSIS — E1151 Type 2 diabetes mellitus with diabetic peripheral angiopathy without gangrene: Secondary | ICD-10-CM | POA: Diagnosis not present

## 2020-05-28 DIAGNOSIS — C3411 Malignant neoplasm of upper lobe, right bronchus or lung: Secondary | ICD-10-CM | POA: Insufficient documentation

## 2020-05-28 DIAGNOSIS — I2510 Atherosclerotic heart disease of native coronary artery without angina pectoris: Secondary | ICD-10-CM | POA: Insufficient documentation

## 2020-05-28 DIAGNOSIS — I129 Hypertensive chronic kidney disease with stage 1 through stage 4 chronic kidney disease, or unspecified chronic kidney disease: Secondary | ICD-10-CM | POA: Insufficient documentation

## 2020-05-28 DIAGNOSIS — Z85118 Personal history of other malignant neoplasm of bronchus and lung: Secondary | ICD-10-CM | POA: Diagnosis not present

## 2020-05-28 DIAGNOSIS — Z79899 Other long term (current) drug therapy: Secondary | ICD-10-CM | POA: Diagnosis not present

## 2020-05-28 DIAGNOSIS — Z8546 Personal history of malignant neoplasm of prostate: Secondary | ICD-10-CM | POA: Insufficient documentation

## 2020-05-28 DIAGNOSIS — N189 Chronic kidney disease, unspecified: Secondary | ICD-10-CM | POA: Insufficient documentation

## 2020-05-28 DIAGNOSIS — Z800 Family history of malignant neoplasm of digestive organs: Secondary | ICD-10-CM | POA: Insufficient documentation

## 2020-05-28 DIAGNOSIS — Z95828 Presence of other vascular implants and grafts: Secondary | ICD-10-CM | POA: Insufficient documentation

## 2020-05-28 DIAGNOSIS — Z85828 Personal history of other malignant neoplasm of skin: Secondary | ICD-10-CM | POA: Insufficient documentation

## 2020-05-28 DIAGNOSIS — Z801 Family history of malignant neoplasm of trachea, bronchus and lung: Secondary | ICD-10-CM | POA: Diagnosis not present

## 2020-05-28 DIAGNOSIS — Z89011 Acquired absence of right thumb: Secondary | ICD-10-CM | POA: Diagnosis not present

## 2020-05-28 DIAGNOSIS — M1990 Unspecified osteoarthritis, unspecified site: Secondary | ICD-10-CM | POA: Insufficient documentation

## 2020-05-28 DIAGNOSIS — Z8051 Family history of malignant neoplasm of kidney: Secondary | ICD-10-CM | POA: Diagnosis not present

## 2020-05-28 DIAGNOSIS — E1122 Type 2 diabetes mellitus with diabetic chronic kidney disease: Secondary | ICD-10-CM | POA: Insufficient documentation

## 2020-05-28 DIAGNOSIS — Z808 Family history of malignant neoplasm of other organs or systems: Secondary | ICD-10-CM | POA: Diagnosis not present

## 2020-05-28 DIAGNOSIS — R590 Localized enlarged lymph nodes: Secondary | ICD-10-CM | POA: Diagnosis present

## 2020-05-28 DIAGNOSIS — Z87891 Personal history of nicotine dependence: Secondary | ICD-10-CM | POA: Diagnosis not present

## 2020-05-28 HISTORY — PX: VIDEO BRONCHOSCOPY WITH ENDOBRONCHIAL NAVIGATION: SHX6175

## 2020-05-28 HISTORY — PX: VIDEO BRONCHOSCOPY WITH ENDOBRONCHIAL ULTRASOUND: SHX6177

## 2020-05-28 LAB — GLUCOSE, CAPILLARY
Glucose-Capillary: 133 mg/dL — ABNORMAL HIGH (ref 70–99)
Glucose-Capillary: 135 mg/dL — ABNORMAL HIGH (ref 70–99)

## 2020-05-28 SURGERY — BRONCHOSCOPY, WITH EBUS
Anesthesia: General

## 2020-05-28 MED ORDER — BUTAMBEN-TETRACAINE-BENZOCAINE 2-2-14 % EX AERO
1.0000 | INHALATION_SPRAY | Freq: Once | CUTANEOUS | Status: DC
  Filled 2020-05-28: qty 20

## 2020-05-28 MED ORDER — PROPOFOL 10 MG/ML IV BOLUS
INTRAVENOUS | Status: AC
  Filled 2020-05-28: qty 20

## 2020-05-28 MED ORDER — ORAL CARE MOUTH RINSE: 15.0000 mL | Freq: Once | OROMUCOSAL | Status: DC

## 2020-05-28 MED ORDER — SODIUM CHLORIDE 0.9 % IV SOLN: INTRAVENOUS | Status: DC

## 2020-05-28 MED ORDER — FENTANYL CITRATE (PF) 100 MCG/2ML IJ SOLN
INTRAMUSCULAR | Status: DC | PRN
  Administered 2020-05-28 (×2): 25 ug via INTRAVENOUS

## 2020-05-28 MED ORDER — SUGAMMADEX SODIUM 200 MG/2ML IV SOLN
INTRAVENOUS | Status: DC | PRN
  Administered 2020-05-28: 200 mg via INTRAVENOUS

## 2020-05-28 MED ORDER — DEXMEDETOMIDINE HCL IN NACL 400 MCG/100ML IV SOLN
INTRAVENOUS | Status: DC | PRN
Start: 2020-05-28 — End: 2020-05-28
  Administered 2020-05-28 (×3): 4 ug via INTRAVENOUS

## 2020-05-28 MED ORDER — DEXAMETHASONE SODIUM PHOSPHATE 10 MG/ML IJ SOLN
INTRAMUSCULAR | Status: DC | PRN
  Administered 2020-05-28: 5 mg via INTRAVENOUS

## 2020-05-28 MED ORDER — DEXMEDETOMIDINE HCL IN NACL 80 MCG/20ML IV SOLN
INTRAVENOUS | Status: AC
  Filled 2020-05-28: qty 20

## 2020-05-28 MED ORDER — ONDANSETRON HCL 4 MG/2ML IJ SOLN
INTRAMUSCULAR | Status: DC | PRN
  Administered 2020-05-28: 4 mg via INTRAVENOUS

## 2020-05-28 MED ORDER — LIDOCAINE HCL URETHRAL/MUCOSAL 2 % EX GEL
1.0000 "application " | Freq: Once | CUTANEOUS | Status: DC
  Filled 2020-05-28: qty 5

## 2020-05-28 MED ORDER — FENTANYL CITRATE (PF) 100 MCG/2ML IJ SOLN
INTRAMUSCULAR | Status: AC
  Filled 2020-05-28: qty 2

## 2020-05-28 MED ORDER — LIDOCAINE HCL (PF) 1 % IJ SOLN
30.0000 mL | Freq: Once | INTRAMUSCULAR | Status: DC
  Filled 2020-05-28: qty 30

## 2020-05-28 MED ORDER — ROCURONIUM BROMIDE 10 MG/ML (PF) SYRINGE
PREFILLED_SYRINGE | INTRAVENOUS | Status: AC
  Filled 2020-05-28: qty 10

## 2020-05-28 MED ORDER — LIDOCAINE HCL (PF) 2 % IJ SOLN
INTRAMUSCULAR | Status: AC
  Filled 2020-05-28: qty 5

## 2020-05-28 MED ORDER — CHLORHEXIDINE GLUCONATE 0.12 % MT SOLN: 15.0000 mL | Freq: Once | OROMUCOSAL | Status: DC

## 2020-05-28 MED ORDER — ONDANSETRON HCL 4 MG/2ML IJ SOLN
INTRAMUSCULAR | Status: AC
  Filled 2020-05-28: qty 2

## 2020-05-28 MED ORDER — PROPOFOL 10 MG/ML IV BOLUS
INTRAVENOUS | Status: DC | PRN
  Administered 2020-05-28: 110 mg via INTRAVENOUS

## 2020-05-28 MED ORDER — LIDOCAINE HCL (CARDIAC) PF 100 MG/5ML IV SOSY
PREFILLED_SYRINGE | INTRAVENOUS | Status: DC | PRN
  Administered 2020-05-28: 60 mg via INTRAVENOUS

## 2020-05-28 MED ORDER — ROCURONIUM BROMIDE 100 MG/10ML IV SOLN
INTRAVENOUS | Status: DC | PRN
  Administered 2020-05-28 (×2): 50 mg via INTRAVENOUS

## 2020-05-28 MED ORDER — PHENYLEPHRINE HCL 0.25 % NA SOLN
1.0000 | Freq: Four times a day (QID) | NASAL | Status: DC | PRN
  Filled 2020-05-28: qty 15

## 2020-05-28 MED ORDER — DEXAMETHASONE SODIUM PHOSPHATE 10 MG/ML IJ SOLN
INTRAMUSCULAR | Status: AC
  Filled 2020-05-28: qty 1

## 2020-05-28 NOTE — Discharge Instructions (Addendum)
  PER DR. ALESKEROV, HE WILL REFILL YOUR TRILOGY INHALER AND YOU CAN PICK IT UP TODAY.     Flexible Bronchoscopy, Care After This sheet gives you information about how to care for yourself after your test. Your doctor may also give you more specific instructions. If you have problems or questions, contact your doctor. Follow these instructions at home: Eating and drinking  Do not eat or drink anything (not even water) for 2 hours after your test, or until your numbing medicine (local anesthetic) wears off.  When your numbness is gone and your cough and gag reflexes have come back, you may: ? Eat only soft foods. ? Slowly drink liquids.  The day after the test, go back to your normal diet. Driving  Do not drive for 24 hours if you were given a medicine to help you relax (sedative).  Do not drive or use heavy machinery while taking prescription pain medicine. General instructions   Take over-the-counter and prescription medicines only as told by your doctor.  Return to your normal activities as told. Ask what activities are safe for you.  Do not use any products that have nicotine or tobacco in them. This includes cigarettes and e-cigarettes. If you need help quitting, ask your doctor.  Keep all follow-up visits as told by your doctor. This is important. It is very important if you had a tissue sample (biopsy) taken. Get help right away if:  You have shortness of breath that gets worse.  You get light-headed.  You feel like you are going to pass out (faint).  You have chest pain.  You cough up: ? More than a little blood. ? More blood than before. Summary  Do not eat or drink anything (not even water) for 2 hours after your test, or until your numbing medicine wears off.  Do not use cigarettes. Do not use e-cigarettes.  Get help right away if you have chest pain. This information is not intended to replace advice given to you by your health care provider. Make sure  you discuss any questions you have with your health care provider. Document Revised: 09/18/2017 Document Reviewed: 10/24/2016 Elsevier Patient Education  2020 [place]   1) The drugs that you were given will stay in your system until tomorrow so for the next 24 hours you should not:  A) Drive an automobile B) Make any legal decisions C) Drink any alcoholic beverage   2) You may resume regular meals tomorrow.  Today it is better to start with liquids and gradually work up to solid foods.  You may eat anything you prefer, but it is better to start with liquids, then soup and crackers, and gradually work up to solid foods.   3) Please notify your doctor immediately if you have any unusual bleeding, trouble breathing, redness and pain at the surgery site, drainage, fever, or pain not relieved by medication.    4) Additional Instructions:        Please contact your physician with any problems or Same Day Surgery at [phone], Monday through Friday 6 am to 4 pm, or [place] at [facility] number at [phone].

## 2020-05-28 NOTE — Anesthesia Postprocedure Evaluation (Signed)
Anesthesia Post Note  Patient: Carlos Levine  Procedure(s) Performed: VIDEO BRONCHOSCOPY WITH ENDOBRONCHIAL ULTRASOUND (N/A ) VIDEO BRONCHOSCOPY WITH ENDOBRONCHIAL NAVIGATION (N/A )  Patient location during evaluation: PACU Anesthesia Type: General Level of consciousness: awake and alert Pain management: pain level controlled Vital Signs Assessment: post-procedure vital signs reviewed and stable Respiratory status: spontaneous breathing, nonlabored ventilation, respiratory function stable and patient connected to nasal cannula oxygen Cardiovascular status: blood pressure returned to baseline and stable Postop Assessment: no apparent nausea or vomiting Anesthetic complications: no   No complications documented.   Last Vitals:  Vitals:   05/28/20 0941 05/28/20 1002  BP: 120/60 127/68  Pulse: 64 62  Resp: 18 18  Temp: (!) 36.1 C   SpO2: 97% 98%    Last Pain:  Vitals:   05/28/20 1002  TempSrc:   PainSc: 0-No pain                 Precious Haws Clessie Karras

## 2020-05-28 NOTE — H&P (Signed)
Pulmonary Medicine          Date: 05/28/2020,   MRN# [id number] Carlos Levine September 16, 1941     Admission                  Current       CHIEF COMPLAINT:   Hilar and mediastinal lymphadenopathy in context of Squamous Cell lung cancer post treatment.    HISTORY OF PRESENT ILLNESS   This is a 79 yo male with PMH as below, he was diagnosed with lung cancer via CT guided biopsy last year. He has squamous cell lung cancer and has followed up with oncology s/p medical therapy and radiation oncology.  His right lung mass has not responded well and surveillance follow up CT was significant for worrisome hilar/mediastinal lymphadenopathy worse on right paratracheal station. Tumor board meeting review was performed and recommendation for lymph node biopsy to detect residual lymph node metastasis. Patient met with Dr Tasia Catchings in July to discuss this and had follow up with me in clinic to review recommendations and plan. We are planning EBUS today and patient is aware of details , risk/complications and whishes to proceed.    PAST MEDICAL HISTORY   Past Medical History:  Diagnosis Date  . Arthritis   . Cancer [facility])    prostate  . Chronic kidney disease   . Coronary artery disease   . Diabetes mellitus without complication ([place])   . Dyspnea    with minimal exertion  . Dysrhythmia    history of SVT  . History of kidney stones   . History of radiation therapy    lung  . Hyperlipidemia   . Hypertension   . Peripheral vascular disease ([place])   . Prostate cancer ([place])   . Squamous cell lung cancer ([place]) 04/2020   also has history of skin cancer     SURGICAL HISTORY   Past Surgical History:  Procedure Laterality Date  . AMPUTATION FINGER / THUMB Right   . [place]   removed 2 discs and fused others  . PROSTATE CRYOABLATION    . SPINAL FUSION    . STENT PLACEMENT ILIAC ([place] HX) Left 2014   Left common iliac and left external iliac by Dr. Delana Meyer  . VIDEO  BRONCHOSCOPY WITH ENDOBRONCHIAL NAVIGATION N/A 05/30/2019   Procedure: VIDEO BRONCHOSCOPY WITH ENDOBRONCHIAL NAVIGATION, DIABETIC;  Surgeon: Ottie Glazier, MD;  Location: [facility] ORS;  Service: Thoracic;  Laterality: N/A;  . VIDEO BRONCHOSCOPY WITH ENDOBRONCHIAL ULTRASOUND N/A 05/30/2019   Procedure: VIDEO BRONCHOSCOPY WITH ENDOBRONCHIAL ULTRASOUND, DIABETIC;  Surgeon: Ottie Glazier, MD;  Location: [facility] ORS;  Service: Thoracic;  Laterality: N/A;     FAMILY HISTORY   Family History  Problem Relation Age of Onset  . Lung cancer Father   . Pancreatic cancer Father   . Brain cancer Mother   . Stomach cancer Paternal Aunt   . Stomach cancer Paternal Uncle   . Cancer Maternal Grandmother   . Kidney cancer Maternal Grandfather      SOCIAL HISTORY   Social History   Tobacco Use  . Smoking status: Former Smoker    Years: 30.00    Types: Cigarettes    Quit date: 10/20/2009    Years since quitting: 10.6  . Smokeless tobacco: Never Used  Vaping Use  . Vaping Use: Never used  Substance Use Topics  . Alcohol use: No  . Drug use: No     MEDICATIONS    Home Medication:  Current Medication:  Current Facility-Administered Medications:  .  0.9 %  sodium chloride infusion, , Intravenous, Continuous, [place], Brett Canales, MD .  butamben-tetracaine-benzocaine (CETACAINE) spray 1 spray, 1 spray, Topical, Once, Labrina Lines, MD .  chlorhexidine (PERIDEX) 0.12 % solution 15 mL, 15 mL, Mouth/Throat, Once **OR** MEDLINE mouth rinse, 15 mL, Mouth Rinse, Once, Ramsdell, Brett Canales, MD .  lidocaine (PF) (XYLOCAINE) 1 % injection 30 mL, 30 mL, Other, Once, Kyah Buesing, MD .  lidocaine (XYLOCAINE) 2 % jelly 1 application, 1 application, Topical, Once, Mazey Mantell, MD .  phenylephrine (NEO-SYNEPHRINE) 0.25 % nasal spray 1 spray, 1 spray, Each Nare, Q6H PRN, Ottie Glazier, MD    ALLERGIES   Contrast media [iodinated diagnostic agents] and Ace inhibitors     REVIEW OF SYSTEMS      Review of Systems:  Gen:  Denies  fever, sweats, chills weigh loss  HEENT: Denies blurred vision, double vision, ear pain, eye pain, hearing loss, nose bleeds, sore throat Cardiac:  No dizziness, chest pain or heaviness, chest tightness,edema Resp:   Denies cough or sputum porduction, shortness of breath,wheezing, hemoptysis,  Gi: Denies swallowing difficulty, stomach pain, nausea or vomiting, diarrhea, constipation, bowel incontinence Gu:  Denies bladder incontinence, burning urine Ext:   Denies Joint pain, stiffness or swelling Skin: Denies  skin rash, easy bruising or bleeding or hives Endoc:  Denies polyuria, polydipsia , polyphagia or weight change Psych:   Denies depression, insomnia or hallucinations   Other:  All other systems negative   VS: BP 120/74   Pulse 73   Temp (!) 97.3 F (36.3 C) (Tympanic)   Resp 12   SpO2 98%      PHYSICAL EXAM    GENERAL:NAD, no fevers, chills, no weakness no fatigue HEAD: Normocephalic, atraumatic.  EYES: Pupils equal, round, reactive to light. Extraocular muscles intact. No scleral icterus.  MOUTH: Moist mucosal membrane. Dentition intact. No abscess noted.  EAR, NOSE, THROAT: Clear without exudates. No external lesions.  NECK: Supple. No thyromegaly. No nodules. No JVD.  PULMONARY: mild rhonchi bilaterally  CARDIOVASCULAR: S1 and S2. Regular rate and rhythm. No murmurs, rubs, or gallops. No edema. Pedal pulses 2+ bilaterally.  GASTROINTESTINAL: Soft, nontender, nondistended. No masses. Positive bowel sounds. No hepatosplenomegaly.  MUSCULOSKELETAL: No swelling, clubbing, or edema. Range of motion full in all extremities.  NEUROLOGIC: Cranial nerves II through XII are intact. No gross focal neurological deficits. Sensation intact. Reflexes intact.  SKIN: No ulceration, lesions, rashes, or cyanosis. Skin warm and dry. Turgor intact.  PSYCHIATRIC: Mood, affect within normal limits. The patient is awake, alert and oriented x 3.  Insight, judgment intact.       IMAGING    No results found.    ASSESSMENT/PLAN   Squamous cell cancer of right lung   interval development of worsening hilar lymphadenopathy concerning for lymph node metastasis.  - plan for EBUS with lymph node biopsies bilaterally with concentration on right lung hilar and mediastinal stations.  -reviewed procedure in detail with patient and he had all questions answered.  -Complications include: bleeding, cough, infection, pneumothorax, pneumomediastinum, possible need for chest tube and intubation with mechanical ventilation and rarely death.  Patient relates understanding above and all additional questions have been answered.       Thank you for allowing me to participate in the care of this patient.   Patient/Family are satisfied with care plan and all questions have been answered.  This document was prepared using Systems analyst and may  include unintentional dictation errors.     Ottie Glazier, M.D.  Division of [place]

## 2020-05-28 NOTE — Anesthesia Procedure Notes (Signed)
Procedure Name: Intubation Date/Time: 05/28/2020 8:02 AM Performed by: Jerrye Noble, CRNA Pre-anesthesia Checklist: Patient identified, Emergency Drugs available, Suction available and Patient being monitored Patient Re-evaluated:Patient Re-evaluated prior to induction Oxygen Delivery Method: Circle system utilized Preoxygenation: Pre-oxygenation with 100% oxygen Induction Type: IV induction Ventilation: Mask ventilation without difficulty Laryngoscope Size: McGraph and 4 Grade View: Grade I Tube type: Oral Tube size: 9.0 mm Number of attempts: 1 Airway Equipment and Method: Stylet and Video-laryngoscopy Placement Confirmation: ETT inserted through vocal cords under direct vision,  positive ETCO2 and breath sounds checked- equal and bilateral Secured at: 20 cm Tube secured with: Tape Dental Injury: Teeth and Oropharynx as per pre-operative assessment

## 2020-05-28 NOTE — OR Nursing (Signed)
Per Dr. Lanney Gins, secure chat, will see pt for follow up in 1-2 weeks.  Scheduled and put on d/c instruction sheet.  Also notified that he will refill pt's Trilogy inhaler and to let pt know he can pick it up today.  Also added to d/c instruction sheet.

## 2020-05-28 NOTE — Procedures (Signed)
FIBEROPTIC BRONCHOSCOPY WITH BRONCHOALVEOLAR LAVAGE AND SURGICAL BIOPSY PROCEDURE NOTE  ENDOBRONCHIAL ULTRASOUND PROCEDURE NOTE    Flexible bronchoscopy was performed  by : Lanney Gins MD  assistance by : 1)Repiratory therapist  and 2)[employer] staff and 3) Anesthesia team and    Indication for the procedure was :  Pre-procedural H&P. The following assessment was performed on the day of the procedure prior to initiating sedation History:  Chest pain n Dyspnea y Hemoptysis n Cough y Fever n Other pertinent items n  Examination Vital signs -reviewed as per nursing documentation today Cardiac    Murmurs: n  Rubs : n  Gallop: n Lungs Wheezing: n Rales : n Rhonchi :y  Other pertinent findings: SOB/hypoxemia due to chronic lung disease   Pre-procedural assessment for Procedural Sedation included: Depth of sedation: As per anesthesia team  ASA Classification:  2 Mallampati airway assessment: 3    Medication list reviewed: y  The patient's interval history was taken and revealed: no new complaints The pre- procedure physical examination revealed: No new findings Refer to prior clinic note for details.  Informed Consent: Informed consent was obtained from:  patient after explanation of procedure and risks, benefits, as well as alternative procedures available.  Explanation of level of sedation and possible transfusion was also provided.    Procedural Preparation: Time out was performed and patient was identified by name and birthdate and procedure to be performed and side for sampling, if any, was specified. Pt was intubated by anesthesia.  The patient was appropriately draped.   Fiberoptic bronchoscopy with airway inspection and BAL Procedure findings:  Bronchoscope was inserted via ETT  without difficulty.  Posterior oropharynx, epiglottis, arytenoids, false cords and vocal cords were not visualized as these were bypassed by endotracheal tube. The  distal trachea was normal in circumference and appearance without mucosal, cartilaginous or branching abnormalities.  The main carina was mildly splayed . All right and left lobar airways were visualized to the Subsegmental level.  Sub- sub segmental carinae were identified in all the distal airways.   Secretions were visible in the following airways and appeared to be clear.  The mucosa was : friable at RUL  Airways were notable for:        exophytic lesions :n       extrinsic compression in the following distributions: n.       Friable mucosa: y       Neurosurgeon /pigmentation: n     Post procedure Diagnosis:   Stenotic RUL apical segment entrance point   Endobronchial ultrasound assisted hilar and mediastinal lymph node biopsies procedure findings: The fiberoptic bronchoscope was removed and the EBUS scope was introduced. Examination began to evaluate for pathologically enlarged lymph nodes starting on the Left side progressing to the right side.  All lymph node biopsies performed with 21g needle. Lymph node biopsies were sent in cytolite for all stations.  Patient had direct Ultrasound measurement of lymph nodes. Left sided stations including 4L, 10L, 11L were normal in shape and size were not biopsied, station 7 was 1.5cm and was biopsied 4 times, station 10R was subcentimeter and was not biopsied, station 4R was 1.1cm and was biopsied 3 times.  All lymph node station had lymphoid sheddind without atypical cells found  Post procedure diagnosis:  Normal lymph node tissue   Specimens obtained included: Estimated Blood loss: 2cc.  Complications included:  none    Disposition: Home . Post procedure reviewed findings with grandson who was  in waiting room.   Follow up with Dr. Lanney Gins in 5 days for result discussion.     Ottie Glazier MD  [place] [facility] of Pulmonary & Critical Care Medicine

## 2020-05-28 NOTE — Anesthesia Preprocedure Evaluation (Signed)
Anesthesia Evaluation  Patient identified by MRN, date of birth, ID band Patient awake    Reviewed: Allergy & Precautions, H&P , NPO status , Patient's Chart, lab work & pertinent test results  History of Anesthesia Complications Negative for: history of anesthetic complications  Airway Mallampati: III  TM Distance: >3 FB Neck ROM: limited    Dental  (+) Edentulous Upper, Edentulous Lower   Pulmonary shortness of breath, COPD, former smoker,    Pulmonary exam normal        Cardiovascular hypertension, (-) angina+ CAD and + Peripheral Vascular Disease  Normal cardiovascular exam+ dysrhythmias      Neuro/Psych negative neurological ROS  negative psych ROS   GI/Hepatic negative GI ROS, Neg liver ROS,   Endo/Other  diabetes, Type 2  Renal/GU Renal disease     Musculoskeletal  (+) Arthritis ,   Abdominal   Peds  Hematology negative hematology ROS (+)   Anesthesia Other Findings Baseline hoarse voice   Past Medical History: No date: Arthritis No date: Cancer (HCC)     Comment:  prostate No date: Chronic kidney disease No date: Coronary artery disease No date: Diabetes mellitus without complication (HCC) No date: Dyspnea     Comment:  with minimal exertion No date: Dysrhythmia     Comment:  history of SVT No date: History of kidney stones No date: History of radiation therapy     Comment:  lung No date: Hyperlipidemia No date: Hypertension No date: Peripheral vascular disease (HCC) No date: Prostate cancer ([place]) 04/2020: Squamous cell lung cancer ([facility])     Comment:  also has history of skin cancer  Past Surgical History: No date: AMPUTATION FINGER / THUMB; Right 1975: BACK SURGERY     Comment:  removed 2 discs and fused others No date: PROSTATE CRYOABLATION No date: SPINAL FUSION 2014: [place] ([place] [facility]X); Left     Comment:  Left common iliac and left external iliac by Dr.  Delana Meyer 05/30/2019: [place]; N/A     Comment:  Procedure: VIDEO BRONCHOSCOPY WITH ENDOBRONCHIAL               NAVIGATION, DIABETIC;  Surgeon: Ottie Glazier, MD;                Location: [facility] ORS;  Service: Thoracic;  Laterality: N/A; 05/30/2019: VIDEO BRONCHOSCOPY WITH ENDOBRONCHIAL ULTRASOUND; N/A     Comment:  Procedure: VIDEO BRONCHOSCOPY WITH ENDOBRONCHIAL               ULTRASOUND, DIABETIC;  Surgeon: Ottie Glazier, MD;                Location: [facility] ORS;  Service: Thoracic;  Laterality: N/A;     Reproductive/Obstetrics negative OB ROS                             Anesthesia Physical Anesthesia Plan  ASA: III  Anesthesia Plan: General ETT   Post-op Pain Management:    Induction: Intravenous  PONV Risk Score and Plan: Ondansetron, Dexamethasone, Midazolam and Treatment may vary due to age or medical condition  Airway Management Planned: Oral ETT  Additional Equipment:   Intra-op Plan:   Post-operative Plan: Extubation in OR  Informed Consent: I have reviewed the patients History and Physical, chart, labs and discussed the procedure including the risks, benefits and alternatives for the proposed anesthesia with the patient or authorized representative who has indicated his/her understanding and  acceptance.     Dental Advisory Given  Plan Discussed with: Anesthesiologist, CRNA and Surgeon  Anesthesia Plan Comments: (Patient consented for risks of anesthesia including but not limited to:  - adverse reactions to medications - damage to eyes, teeth, lips or other oral mucosa - nerve damage due to positioning  - sore throat or hoarseness - Damage to heart, brain, nerves, lungs, other parts of body or loss of life  Patient voiced understanding.)        Anesthesia Quick Evaluation

## 2020-05-28 NOTE — Transfer of Care (Signed)
Immediate Anesthesia Transfer of Care Note  Patient: Carlos Levine  Procedure(s) Performed: VIDEO BRONCHOSCOPY WITH ENDOBRONCHIAL ULTRASOUND (N/A ) VIDEO BRONCHOSCOPY WITH ENDOBRONCHIAL NAVIGATION (N/A )  Patient Location: PACU  Anesthesia Type:General  Level of Consciousness: awake, alert  and oriented  Airway & Oxygen Therapy: Patient Spontanous Breathing and Patient connected to face mask oxygen  Post-op Assessment: Report given to RN and Post -op Vital signs reviewed and stable  Post vital signs: Reviewed and stable  Last Vitals:  Vitals Value Taken Time  BP 123/61 05/28/20 0903  Temp 35.9 C 05/28/20 0902  Pulse 65 05/28/20 0904  Resp 21 05/28/20 0904  SpO2 99 % 05/28/20 0904  Vitals shown include unvalidated device data.  Last Pain:  Vitals:   05/28/20 0609  TempSrc: Tympanic  PainSc: 0-No pain         Complications: No complications documented.

## 2020-05-29 LAB — CYTOLOGY - NON PAP

## 2020-06-04 ENCOUNTER — Ambulatory Visit
Admission: RE | Admit: 2020-06-04 | Discharge: 2020-06-04 | Disposition: A | Discharge status: Home / Self Care | Payer: Medicare Other | Source: Ambulatory Visit | Attending: Radiation Oncology | Admitting: Radiation Oncology

## 2020-06-04 ENCOUNTER — Other Ambulatory Visit: Payer: Self-pay

## 2020-06-04 DIAGNOSIS — C3491 Malignant neoplasm of unspecified part of right bronchus or lung: Secondary | ICD-10-CM | POA: Diagnosis present

## 2020-06-07 ENCOUNTER — Inpatient Hospital Stay: Admission: RE | Admit: 2020-06-07 | Payer: Medicare Other | Source: Ambulatory Visit | Admitting: Radiation Oncology

## 2020-06-14 ENCOUNTER — Encounter: Payer: Self-pay | Admitting: Radiation Oncology

## 2020-06-14 ENCOUNTER — Other Ambulatory Visit: Payer: Self-pay

## 2020-06-14 ENCOUNTER — Other Ambulatory Visit: Payer: Self-pay | Admitting: *Deleted

## 2020-06-14 ENCOUNTER — Ambulatory Visit
Admission: RE | Admit: 2020-06-14 | Discharge: 2020-06-14 | Disposition: A | Discharge status: Home / Self Care | Payer: Medicare Other | Source: Ambulatory Visit | Attending: Radiation Oncology | Admitting: Radiation Oncology

## 2020-06-14 ENCOUNTER — Telehealth: Payer: Self-pay | Admitting: *Deleted

## 2020-06-14 VITALS — BP 139/72 | HR 46 | Temp 98.0°F | Wt 216.1 lb

## 2020-06-14 DIAGNOSIS — C3491 Malignant neoplasm of unspecified part of right bronchus or lung: Secondary | ICD-10-CM

## 2020-06-14 DIAGNOSIS — C3411 Malignant neoplasm of upper lobe, right bronchus or lung: Secondary | ICD-10-CM | POA: Diagnosis not present

## 2020-06-14 DIAGNOSIS — Z923 Personal history of irradiation: Secondary | ICD-10-CM | POA: Diagnosis not present

## 2020-06-14 NOTE — Telephone Encounter (Signed)
Called patient to advise him of CT Scan on 12/05/20 at [facility] arrive at 12:45 test begins and 1 PM.

## 2020-06-14 NOTE — Progress Notes (Signed)
Radiation Oncology Follow up Note  Name: Carlos Levine   Date:   06/14/2020 MRN:  [id number] DOB: 06/26/1941    This 79 y.o. male presents to the clinic today for 8-month follow-up.  Status post SBRT to his right lung for stage I squamous cell carcinoma  REFERRING PROVIDER: [name], MD  HPI: Patient is an 79 year old male now out 11 months having completed SBRT to his right lung for stage I squamous cell carcinoma.  Seen today in routine follow-up he is doing well.  Specifically [place] cough chest tightness or hemoptysis.Marland Kitchen  His recent CT scan from this month shows changes consistent with radiation therapy in the right upper lobe.  Borderline enlarged right paratracheal lymph nodes are stable in size.  COMPLICATIONS OF TREATMENT: none  FOLLOW UP COMPLIANCE: keeps appointments   PHYSICAL EXAM:  BP 139/72 (BP Location: Right Arm, Patient Position: Sitting, Cuff Size: Normal)   Pulse (!) 46   Temp 98 F (36.7 C) (Tympanic)   Wt 216 lb 1.6 oz (98 kg)   BMI 32.38 kg/m  Well-developed well-nourished patient in NAD. HEENT reveals PERLA, EOMI, discs not visualized.  Oral cavity is clear. No oral mucosal lesions are identified. Neck is clear without evidence of cervical or supraclavicular adenopathy. Lungs are clear to A&P. Cardiac examination is essentially unremarkable with regular rate and rhythm without murmur rub or thrill. Abdomen is benign with no organomegaly or masses noted. Motor sensory and DTR levels are equal and symmetric in the upper and lower extremities. Cranial nerves II through XII are grossly intact. Proprioception is intact. No peripheral adenopathy or edema is identified. No motor or sensory levels are noted. Crude visual fields are within normal range.  RADIOLOGY RESULTS: Serial CT scans are reviewed compatible with above-stated findings  PLAN: Present time patient continues to do well with excellent result from SBRT with very low side effect profile.  And pleased  with his overall progress.  Of asked to see him back in 6 months for follow-up with a follow-up CT scan.  If that is also stable we will go to once year follow-up visits.  I would like to take this opportunity to thank you for allowing me to participate in the care of your patient.Noreene Filbert, MD

## 2020-06-30 IMAGING — CT NM PET TUM IMG RESTAG (PS) SKULL BASE T - THIGH
1 of 9 series · 1 of 25 positions shown · non-contrast
Comparison: PET-CT 04/04/2019, CT 11/25/2019, CT 08/26/2019

CLINICAL DATA: Sub treatment strategy for non-small cell lung
cancer.

EXAM:
NUCLEAR MEDICINE PET SKULL BASE TO THIGH
TECHNIQUE: 11.3 mCi F-18 FDG was injected intravenously. Full-ring PET imaging
was performed from the skull base to thigh after the radiotracer. CT
data was obtained and used for attenuation correction and anatomic
localization.
Fasting blood glucose: 147 mg/dl

[Series 3: ct wb 5.0 b30f · axial · 5.0mm · 0.98mm/px · 1 of 329 slices shown]
[im 329/329  brain]
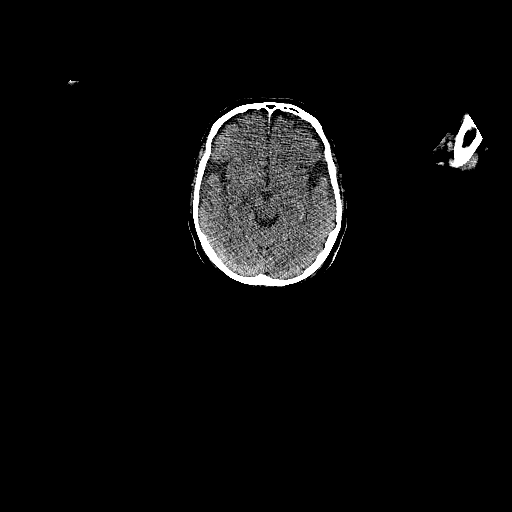

[1 of 25 positions shown; findings below may reference images not displayed]

FINDINGS: Mediastinal blood pool activity: SUV max

Liver activity: SUV max NA

NECK: No hypermetabolic lymph nodes in the neck.

Incidental CT findings: none

CHEST: There is intense metabolic activity associated with the RIGHT
upper lobe consolidative process. The consolidation measures 7.6 x
5.5 cm similar to 7.0 x 5.5 cm on most recent CT exam. The entirety
of the lesion is intensely metabolic with SUV max equal 5.9.
Centrally within the lesion towards the hilum there is a more focal
uptake with SUV max equal 9.7.

The pretreatment lesion (PET-CT 04/04/2019) in the RIGHT upper lobe
was smaller and round measuring 2.9 cm intense metabolic activity
(SUV max equal 9.3).

There are no discrete hypermetabolic mediastinal lymph nodes. No
hypermetabolic hilar lymph nodes.

No lesions in the LEFT lung

Incidental CT findings: None

ABDOMEN/PELVIS: No abnormal hypermetabolic activity within the
liver, pancreas, adrenal glands, or spleen. No hypermetabolic lymph
nodes in the abdomen or pelvis.

Incidental CT findings: Atherosclerotic calcification of the aorta.

SKELETON: No focal hypermetabolic activity to suggest skeletal
metastasis.

Incidental CT findings: none
IMPRESSION: 1. Intensely hypermetabolic consolidation in the RIGHT upper lobe is
favored inflammation or infection. Central hypermetabolic activity
towards the hilar aspect of the mass could represent residual
carcinoma. Recommend follow-up contrast CT.
2. No evidence of mediastinal metastatic nodal disease.
3. No evidence distant metastatic disease

## 2020-07-16 ENCOUNTER — Telehealth: Payer: Self-pay | Admitting: *Deleted

## 2020-07-16 NOTE — Telephone Encounter (Signed)
Ovid Curd , NP with [facility] called to report that patient is going to have a [employer]  inserted and as pre op for that, he had a CXR which is abnormal (increased size of opacity in RUL) and is asking if patient should follow up with you and have a CT with contrast for further workup. Please advise

## 2020-07-17 NOTE — Telephone Encounter (Signed)
He just had a CT in August which showed radiation changes in RUL. No need to repeat from oncology aspect unless he has more symptoms. Thanks.

## 2020-07-17 NOTE — Telephone Encounter (Signed)
Returned call to the phone left on this message.  I did not leave a message due to voicemail being a non secured generic voicemail.

## 2020-07-18 NOTE — Telephone Encounter (Signed)
Tried returning call again today but went straight to a generic voicemail.  Unsure if this is a secure voicemail so no message was left.

## 2020-07-19 NOTE — Telephone Encounter (Signed)
Call returned to [facility] and informed of doctor response. He thanked me for returning his call and said that he is getting ready to call Mr [place] and will let him know as well

## 2020-07-23 ENCOUNTER — Other Ambulatory Visit
Admission: RE | Admit: 2020-07-23 | Discharge: 2020-07-23 | Disposition: A | Discharge status: Home / Self Care | Payer: Medicare Other | Source: Ambulatory Visit | Attending: Cardiology | Admitting: Cardiology

## 2020-07-23 ENCOUNTER — Other Ambulatory Visit: Payer: Self-pay

## 2020-07-23 DIAGNOSIS — Z01812 Encounter for preprocedural laboratory examination: Secondary | ICD-10-CM | POA: Diagnosis present

## 2020-07-23 DIAGNOSIS — Z20822 Contact with and (suspected) exposure to covid-19: Secondary | ICD-10-CM | POA: Insufficient documentation

## 2020-07-23 LAB — SARS CORONAVIRUS 2 (TAT 6-24 HRS): SARS Coronavirus 2: NEGATIVE

## 2020-07-25 ENCOUNTER — Encounter: Payer: Self-pay | Admitting: Cardiology

## 2020-07-25 ENCOUNTER — Other Ambulatory Visit: Payer: Self-pay

## 2020-07-25 ENCOUNTER — Observation Stay
Admission: RE | Admit: 2020-07-25 | Discharge: 2020-07-26 | Disposition: A | Discharge status: Home / Self Care | Payer: Medicare Other | Attending: Cardiology | Admitting: Cardiology

## 2020-07-25 ENCOUNTER — Encounter
Admission: RE | Disposition: A | Discharge status: Home / Self Care | Payer: Self-pay | Source: Home / Self Care | Attending: Cardiology

## 2020-07-25 DIAGNOSIS — I495 Sick sinus syndrome: Secondary | ICD-10-CM | POA: Diagnosis not present

## 2020-07-25 DIAGNOSIS — I10 Essential (primary) hypertension: Secondary | ICD-10-CM | POA: Diagnosis not present

## 2020-07-25 DIAGNOSIS — Z87891 Personal history of nicotine dependence: Secondary | ICD-10-CM | POA: Diagnosis not present

## 2020-07-25 DIAGNOSIS — Z7901 Long term (current) use of anticoagulants: Secondary | ICD-10-CM | POA: Diagnosis not present

## 2020-07-25 DIAGNOSIS — G9001 Carotid sinus syncope: Secondary | ICD-10-CM | POA: Diagnosis present

## 2020-07-25 DIAGNOSIS — Z8546 Personal history of malignant neoplasm of prostate: Secondary | ICD-10-CM | POA: Diagnosis not present

## 2020-07-25 DIAGNOSIS — Z006 Encounter for examination for normal comparison and control in clinical research program: Secondary | ICD-10-CM | POA: Diagnosis not present

## 2020-07-25 DIAGNOSIS — E119 Type 2 diabetes mellitus without complications: Secondary | ICD-10-CM | POA: Diagnosis not present

## 2020-07-25 DIAGNOSIS — I480 Paroxysmal atrial fibrillation: Secondary | ICD-10-CM | POA: Diagnosis not present

## 2020-07-25 HISTORY — PX: PACEMAKER LEADLESS INSERTION: EP1219

## 2020-07-25 LAB — GLUCOSE, CAPILLARY: Glucose-Capillary: 131 mg/dL — ABNORMAL HIGH (ref 70–99)

## 2020-07-25 SURGERY — PACEMAKER LEADLESS INSERTION
Anesthesia: Moderate Sedation

## 2020-07-25 MED ORDER — LIDOCAINE HCL (PF) 1 % IJ SOLN
INTRAMUSCULAR | Status: AC
  Filled 2020-07-25: qty 30

## 2020-07-25 MED ORDER — ONDANSETRON HCL 4 MG/2ML IJ SOLN: 4.0000 mg | Freq: Four times a day (QID) | INTRAMUSCULAR | Status: DC | PRN

## 2020-07-25 MED ORDER — HEPARIN SODIUM (PORCINE) 1000 UNIT/ML IJ SOLN
INTRAMUSCULAR | Status: DC | PRN
  Administered 2020-07-25: 4000 [IU] via INTRAVENOUS

## 2020-07-25 MED ORDER — HEPARIN SODIUM (PORCINE) 1000 UNIT/ML IJ SOLN
INTRAMUSCULAR | Status: AC
  Filled 2020-07-25: qty 1

## 2020-07-25 MED ORDER — FENTANYL CITRATE (PF) 100 MCG/2ML IJ SOLN
INTRAMUSCULAR | Status: DC | PRN
Start: 2020-07-25 — End: 2020-07-25
  Administered 2020-07-25: 25 ug via INTRAVENOUS

## 2020-07-25 MED ORDER — PIOGLITAZONE HCL 15 MG PO TABS
15.0000 mg | ORAL_TABLET | Freq: Every day | ORAL | Status: DC
  Administered 2020-07-25 – 2020-07-26 (×2): 15 mg via ORAL
  Filled 2020-07-25 (×2): qty 1

## 2020-07-25 MED ORDER — DIPHENHYDRAMINE HCL 50 MG/ML IJ SOLN
INTRAMUSCULAR | Status: AC
  Filled 2020-07-25: qty 1

## 2020-07-25 MED ORDER — HEPARIN (PORCINE) IN NACL 1000-0.9 UT/500ML-% IV SOLN
INTRAVENOUS | Status: DC | PRN
  Administered 2020-07-25 (×2): 500 mL

## 2020-07-25 MED ORDER — FAMOTIDINE IN NACL 20-0.9 MG/50ML-% IV SOLN
INTRAVENOUS | Status: AC | PRN
  Administered 2020-07-25: 40 mg via INTRAVENOUS

## 2020-07-25 MED ORDER — OXYCODONE HCL 5 MG/5ML PO SOLN
5.0000 mg | Freq: Once | ORAL | Status: DC | PRN
  Filled 2020-07-25: qty 5

## 2020-07-25 MED ORDER — BICALUTAMIDE 50 MG PO TABS
50.0000 mg | ORAL_TABLET | Freq: Every day | ORAL | Status: DC
  Administered 2020-07-25 – 2020-07-26 (×2): 50 mg via ORAL
  Filled 2020-07-25 (×2): qty 1

## 2020-07-25 MED ORDER — METHYLPREDNISOLONE SODIUM SUCC 125 MG IJ SOLR
INTRAMUSCULAR | Status: AC
  Filled 2020-07-25: qty 2

## 2020-07-25 MED ORDER — IOHEXOL 300 MG/ML  SOLN
INTRAMUSCULAR | Status: DC | PRN
  Administered 2020-07-25: 45 mL

## 2020-07-25 MED ORDER — FAMOTIDINE 20 MG PO TABS
ORAL_TABLET | ORAL | Status: AC
  Filled 2020-07-25: qty 2

## 2020-07-25 MED ORDER — ACETAMINOPHEN 325 MG PO TABS: 650.0000 mg | ORAL_TABLET | ORAL | Status: DC | PRN

## 2020-07-25 MED ORDER — YOU HAVE A PACEMAKER BOOK
Freq: Once | Status: DC
  Filled 2020-07-25: qty 1

## 2020-07-25 MED ORDER — SODIUM CHLORIDE 0.9% FLUSH
3.0000 mL | Freq: Two times a day (BID) | INTRAVENOUS | Status: DC
  Administered 2020-07-25: 3 mL via INTRAVENOUS

## 2020-07-25 MED ORDER — MIDAZOLAM HCL 2 MG/2ML IJ SOLN
INTRAMUSCULAR | Status: AC
  Filled 2020-07-25: qty 2

## 2020-07-25 MED ORDER — DIPHENHYDRAMINE HCL 50 MG/ML IJ SOLN
INTRAMUSCULAR | Status: DC | PRN
  Administered 2020-07-25: 50 mg via INTRAVENOUS

## 2020-07-25 MED ORDER — PRAVASTATIN SODIUM 40 MG PO TABS
80.0000 mg | ORAL_TABLET | Freq: Every day | ORAL | Status: DC
  Administered 2020-07-25: 80 mg via ORAL
  Filled 2020-07-25: qty 2

## 2020-07-25 MED ORDER — SODIUM CHLORIDE 0.9 % IV SOLN: INTRAVENOUS | Status: DC

## 2020-07-25 MED ORDER — SODIUM CHLORIDE 0.9% FLUSH: 3.0000 mL | INTRAVENOUS | Status: DC | PRN

## 2020-07-25 MED ORDER — HEPARIN (PORCINE) IN NACL 1000-0.9 UT/500ML-% IV SOLN
INTRAVENOUS | Status: AC
  Filled 2020-07-25: qty 1000

## 2020-07-25 MED ORDER — FENTANYL CITRATE (PF) 100 MCG/2ML IJ SOLN: 25.0000 ug | INTRAMUSCULAR | Status: DC | PRN

## 2020-07-25 MED ORDER — SODIUM CHLORIDE 0.9 % IV SOLN
INTRAVENOUS | Status: DC
  Administered 2020-07-25: 1000 mL via INTRAVENOUS

## 2020-07-25 MED ORDER — METHYLPREDNISOLONE SODIUM SUCC 125 MG IJ SOLR
INTRAMUSCULAR | Status: DC | PRN
  Administered 2020-07-25: 125 mg via INTRAVENOUS

## 2020-07-25 MED ORDER — FENTANYL CITRATE (PF) 100 MCG/2ML IJ SOLN
INTRAMUSCULAR | Status: AC
  Filled 2020-07-25: qty 2

## 2020-07-25 MED ORDER — PREDNISONE 10 MG PO TABS
20.0000 mg | ORAL_TABLET | Freq: Every day | ORAL | Status: DC
  Administered 2020-07-26: 20 mg via ORAL
  Filled 2020-07-25: qty 2

## 2020-07-25 MED ORDER — MIDAZOLAM HCL 2 MG/2ML IJ SOLN
INTRAMUSCULAR | Status: DC | PRN
  Administered 2020-07-25: 1 mg via INTRAVENOUS

## 2020-07-25 MED ORDER — OXYCODONE HCL 5 MG PO TABS: 5.0000 mg | ORAL_TABLET | Freq: Once | ORAL | Status: DC | PRN

## 2020-07-25 MED ORDER — SODIUM CHLORIDE 0.9 % IV SOLN: 250.0000 mL | INTRAVENOUS | Status: DC | PRN

## 2020-07-25 SURGICAL SUPPLY — 13 items
DILATOR VESSEL 38 20CM 12FR (INTRODUCER) ×3 IMPLANT
DILATOR VESSEL 38 20CM 14FR (INTRODUCER) ×3 IMPLANT
DILATOR VESSEL 38 20CM 18FR (INTRODUCER) ×3 IMPLANT
DILATOR VESSEL 38 20CM 8FR (INTRODUCER) ×3 IMPLANT
GUIDEWIRE SUPER STIFF .035X180 (WIRE) ×3 IMPLANT
MICRA AV TRANSCATH PACING SYS (Pacemaker) ×3 IMPLANT
MICRA INTRODUCER SHEATH (SHEATH) ×3
NEEDLE PERC 18GX7CM (NEEDLE) ×3 IMPLANT
PACK CARDIAC CATH (CUSTOM PROCEDURE TRAY) ×3 IMPLANT
SHEATH AVANTI 7FRX11 (SHEATH) ×6 IMPLANT
SHEATH INTRODUCER MICRA (SHEATH) ×1 IMPLANT
SYSTEM PACING TRNSCTH AV MICRA (Pacemaker) ×1 IMPLANT
WIRE GUIDERIGHT .035X150 (WIRE) ×3 IMPLANT

## 2020-07-26 ENCOUNTER — Encounter: Payer: Self-pay | Admitting: Cardiology

## 2020-07-26 DIAGNOSIS — G9001 Carotid sinus syncope: Secondary | ICD-10-CM | POA: Diagnosis not present

## 2020-07-26 DIAGNOSIS — I495 Sick sinus syndrome: Secondary | ICD-10-CM | POA: Diagnosis not present

## 2020-07-26 LAB — BASIC METABOLIC PANEL
Anion gap: 6 (ref 5–15)
BUN: 26 mg/dL — ABNORMAL HIGH (ref 8–23)
CO2: 26 mmol/L (ref 22–32)
Calcium: 9 mg/dL (ref 8.9–10.3)
Chloride: 108 mmol/L (ref 98–111)
Creatinine, Ser: 1.64 mg/dL — ABNORMAL HIGH (ref 0.61–1.24)
GFR calc non Af Amer: 39 mL/min — ABNORMAL LOW (ref >60)
Glucose, Bld: 164 mg/dL — ABNORMAL HIGH (ref 70–99)
Potassium: 4.9 mmol/L (ref 3.5–5.1)
Sodium: 140 mmol/L (ref 135–145)

## 2020-07-26 NOTE — Progress Notes (Signed)
[name] to be D/C'd Home per MD order.  Discussed prescriptions and follow up appointments with the patient. Prescriptions given to patient, medication list explained in detail. Pt verbalized understanding.  Allergies as of 07/26/2020      Reactions   Contrast Media [iodinated Diagnostic Agents] Hives, Rash   Ace Inhibitors Other (See Comments)   Unknown reaction type      Medication List    TAKE these medications   apixaban 2.5 MG Tabs tablet Commonly known as: ELIQUIS Take 2.5 mg by mouth every 12 (twelve) hours.   bicalutamide 50 MG tablet Commonly known as: CASODEX Take 1 tablet (50 mg total) by mouth daily at 12 noon.   diltiazem 120 MG 24 hr capsule Commonly known as: Cardizem CD Take 1 capsule (120 mg total) by mouth daily.   docusate sodium 100 MG capsule Commonly known as: COLACE Take 200 mg by mouth daily as needed for mild constipation.   pioglitazone 15 MG tablet Commonly known as: ACTOS Take 15 mg by mouth daily.   pravastatin 80 MG tablet Commonly known as: PRAVACHOL Take 80 mg by mouth at bedtime.       Vitals:   07/26/20 0334 07/26/20 0958  BP: 111/73 126/85  Pulse: 78 94  Resp: 17 17  Temp: 98.3 F (36.8 C) 97.7 F (36.5 C)  SpO2: 97% 98%    Tele box removed and returned. Skin clean, dry and intact without evidence of skin break down, no evidence of skin tears noted. IV catheter discontinued intact. Site without signs and symptoms of complications. Dressing and pressure applied. Pt denies pain at this time. No complaints noted.  An After Visit Summary was printed and given to the patient. Patient escorted via [place], and D/C home via private auto.  Rolley Sims

## 2020-07-26 NOTE — Discharge Summary (Signed)
   Physician Discharge Summary      Patient ID: Carlos Levine MRN: [id number] DOB/AGE: 02/25/1941 79 y.o.  Admit date: 07/25/2020 Discharge date: 07/26/2020  Primary Discharge Diagnosis sick sinus syndrome, paroxysmal atrial fibrillation Secondary Discharge Diagnosis same  Significant Diagnostic Studies: none  Consults: None  Hospital Course: 79 year old male with a history of paroxsymal atrial fibrillation and sick sinus syndrome who failed conservative management and elected for surgical management. The patient underwent successful Micra leadless pacemaker implantation on 07/25/2020 without apparent perioperative complications. ECG this morning reveals sinus rhythm at a rate of 87 bpm. The patient denies chest pain, worsening breathing, or pain at the insertion site. He is eager to be discharged.   Discharge Exam: Blood pressure 111/73, pulse 78, temperature 98.3 F (36.8 C), resp. rate 17, height 5\' 10"  (1.778 m), weight 99.5 kg, SpO2 97 %.   General appearance: alert, cooperative, appears stated age and no distress Head: Normocephalic, without obvious abnormality, atraumatic Eyes: negative Neck: no JVD Resp: normal effort of breathing on room air, some expiratory wheezing Cardio: irregularly irregular Extremities: extremities normal, atraumatic, no cyanosis or edema Neurologic: Grossly normal Incision/Wound:right groin without active bleeding, erythema, warmth or hematoma. Figure 8 suture removed with no active bleeding, and gauze pad applied for patient's comfort. Labs:   Lab Results  Component Value Date   WBC 9.1 11/28/2019   HGB 13.1 11/28/2019   HCT 42.8 11/28/2019   MCV 93.2 11/28/2019   PLT 313 11/28/2019    Recent Labs  Lab 07/26/20 0621  NA 140  K 4.9  CL 108  CO2 26  BUN 26*  CREATININE 1.64*  CALCIUM 9.0  GLUCOSE 164*      Radiology: none EKG: sinus rhythm, 87 bpm  FOLLOW UP PLANS AND APPOINTMENTS  Allergies as of 07/26/2020      Reactions     Contrast Media [iodinated Diagnostic Agents] Hives, Rash   Ace Inhibitors Other (See Comments)   Unknown reaction type      Medication List    TAKE these medications   apixaban 2.5 MG Tabs tablet Commonly known as: ELIQUIS Take 2.5 mg by mouth every 12 (twelve) hours.   bicalutamide 50 MG tablet Commonly known as: CASODEX Take 1 tablet (50 mg total) by mouth daily at 12 noon.   diltiazem 120 MG 24 hr capsule Commonly known as: Cardizem CD Take 1 capsule (120 mg total) by mouth daily.   docusate sodium 100 MG capsule Commonly known as: COLACE Take 200 mg by mouth daily as needed for mild constipation.   pioglitazone 15 MG tablet Commonly known as: ACTOS Take 15 mg by mouth daily.   pravastatin 80 MG tablet Commonly known as: PRAVACHOL Take 80 mg by mouth at bedtime.       Follow-up Information    Corey Skains, MD. Go in 1 week(s).   Specialty: Cardiology Contact information: [facility] [place] [address] [phone]               BRING ALL MEDICATIONS WITH YOU TO FOLLOW UP APPOINTMENTS  Time spent with patient to include physician time: 40 minutes Signed:  Clabe Seal PA-C 07/26/2020, 8:41 AM

## 2020-07-26 NOTE — Discharge Instructions (Signed)
You may resume Eliquis twice a day starting tomorrow (Friday.) You will continue taking Cardizem. Dr. Nehemiah Massed will see you in the clinic in 1 week to discuss your pacemaker further. In the meantime, please avoid heavy lifting or squatting. You may keep a bandage on the upper hip area (groin) for your comfort, but you do not have t-o. There are no stitches. You may shower, just. avoid scrubbing that site. If you have any questions, please call the office at [phone].

## 2020-08-09 DIAGNOSIS — Z950 Presence of cardiac pacemaker: Secondary | ICD-10-CM | POA: Insufficient documentation

## 2020-11-10 ENCOUNTER — Other Ambulatory Visit: Payer: Self-pay | Admitting: Urology

## 2020-11-14 ENCOUNTER — Ambulatory Visit: Payer: Self-pay | Admitting: Urology

## 2020-11-23 ENCOUNTER — Ambulatory Visit: Payer: Medicare Other | Admitting: Urology

## 2020-11-23 ENCOUNTER — Encounter: Payer: Self-pay | Admitting: Urology

## 2020-11-23 ENCOUNTER — Other Ambulatory Visit: Payer: Self-pay

## 2020-11-23 VITALS — BP 134/82 | HR 80 | Ht 68.0 in | Wt 220.0 lb

## 2020-11-23 DIAGNOSIS — C61 Malignant neoplasm of prostate: Secondary | ICD-10-CM

## 2020-11-23 DIAGNOSIS — Z8546 Personal history of malignant neoplasm of prostate: Secondary | ICD-10-CM

## 2020-11-23 DIAGNOSIS — C3491 Malignant neoplasm of unspecified part of right bronchus or lung: Secondary | ICD-10-CM

## 2020-11-23 MED ORDER — BICALUTAMIDE 50 MG PO TABS
50.0000 mg | ORAL_TABLET | Freq: Every day | ORAL | 3 refills | Status: DC
Start: 2020-11-23 — End: 2021-11-28

## 2020-11-23 NOTE — Progress Notes (Signed)
11/23/2020 12:52 PM   Carlos Levine Feb 26, 1941 [id number]  Referring provider: [name], MD [facility] [place],  [place] [address]  Chief Complaint  Patient presents with  . 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 male presents for annual follow-up.   Remains on Casodex 50 mg daily  No bothersome LUTS  Denies dysuria, gross hematuria  No flank, abdominal or pelvic pain  PSA 11/2019 was <0.01   PMH: Past Medical History:  Diagnosis Date  . Arthritis   . Cancer [facility])    prostate  . Chronic kidney disease   . Coronary artery disease   . Diabetes mellitus without complication ([place])   . Dyspnea    with minimal exertion  . Dysrhythmia    history of SVT  . History of kidney stones   . History of radiation therapy    lung  . Hyperlipidemia   . Hypertension   . Peripheral vascular disease ([place])   . Prostate cancer ([place])   . Squamous cell lung cancer ([place]) 04/2020   also has history of skin cancer    Surgical History: Past Surgical History:  Procedure Laterality Date  . AMPUTATION FINGER / THUMB Right   . [place]   removed 2 discs and fused others  . PACEMAKER LEADLESS INSERTION N/A 07/25/2020   Procedure: PACEMAKER LEADLESS INSERTION;  Surgeon: Isaias Cowman, MD;  Location: [place] [facility];  Service: Cardiovascular;  Laterality: N/A;  . PROSTATE CRYOABLATION    . SPINAL FUSION    . STENT PLACEMENT ILIAC ([place] HX) Left 2014   Left common iliac and left external iliac by Dr. Delana Meyer  . VIDEO BRONCHOSCOPY WITH ENDOBRONCHIAL NAVIGATION N/A 05/30/2019    Procedure: VIDEO BRONCHOSCOPY WITH ENDOBRONCHIAL NAVIGATION, DIABETIC;  Surgeon: Ottie Glazier, MD;  Location: [facility];  Service: Thoracic;  Laterality: N/A;  . VIDEO BRONCHOSCOPY WITH ENDOBRONCHIAL NAVIGATION N/A 05/28/2020   Procedure: VIDEO BRONCHOSCOPY WITH ENDOBRONCHIAL NAVIGATION;  Surgeon: Ottie Glazier, MD;  Location: [facility];  Service: Thoracic;  Laterality: N/A;  . VIDEO BRONCHOSCOPY WITH ENDOBRONCHIAL ULTRASOUND N/A 05/30/2019   Procedure: VIDEO BRONCHOSCOPY WITH ENDOBRONCHIAL ULTRASOUND, DIABETIC;  Surgeon: Ottie Glazier, MD;  Location: [facility];  Service: Thoracic;  Laterality: N/A;  . VIDEO BRONCHOSCOPY WITH ENDOBRONCHIAL ULTRASOUND N/A 05/28/2020   Procedure: VIDEO BRONCHOSCOPY WITH ENDOBRONCHIAL ULTRASOUND;  Surgeon: Ottie Glazier, MD;  Location: [facility];  Service: Thoracic;  Laterality: N/A;    Home Medications:  Allergies as of 11/23/2020      Reactions   Contrast Media [iodinated Diagnostic Agents] Hives, Rash   Ace Inhibitors Other (See Comments)   Unknown reaction type      Medication List       Accurate as of November 23, 2020 12:52 PM. If you have any questions, ask your nurse or doctor.        apixaban 2.5 MG Tabs tablet Commonly known as: ELIQUIS Take 2.5 mg by mouth every 12 (twelve) hours.   bicalutamide 50 MG tablet Commonly known as: CASODEX Take 1 tablet (50 mg total) by mouth daily at 12 noon.   diltiazem 120 MG 24 hr capsule Commonly known as: Cardizem CD Take 1 capsule (120 mg total) by mouth daily.  docusate sodium 100 MG capsule Commonly known as: COLACE Take 200 mg by mouth daily as needed for mild constipation.   pioglitazone 15 MG tablet Commonly known as: ACTOS Take 15 mg by mouth daily.   pravastatin 80 MG tablet Commonly known as: PRAVACHOL Take 80 mg by mouth at bedtime.       Allergies:  Allergies  Allergen Reactions  . Contrast Media [Iodinated Diagnostic Agents] Hives and Rash  . Ace Inhibitors Other (See  Comments)    Unknown reaction type    Family History: Family History  Problem Relation Age of Onset  . Lung cancer Father   . Pancreatic cancer Father   . Brain cancer Mother   . Stomach cancer Paternal Aunt   . Stomach cancer Paternal Uncle   . Cancer Maternal Grandmother   . Kidney cancer Maternal Grandfather     Social History:  reports that he quit smoking about 11 years ago. His smoking use included cigarettes. He quit after 30.00 years of use. He has never used smokeless tobacco. He reports that he does not drink alcohol and does not use drugs.   Physical Exam: BP 134/82   Pulse 80   Ht 5\' 8"  (1.727 m)   Wt 220 lb (99.8 kg)   BMI 33.45 kg/m   Constitutional:  Alert and oriented, No acute distress. HEENT: [place] AT, moist mucus membranes.  Trachea midline, no masses. Cardiovascular: No clubbing, cyanosis, or edema. Respiratory: Normal respiratory effort, no increased work of breathing. Psychiatric: Normal mood and affect.   Assessment & Plan:    1.  History high risk prostate cancer status post cryoablation  Bicalutamide started for rising PSA which has remained undetectable since 2016  Bicalutamide was refilled  Scheduled for follow-up in oncology next week.  He states he is having blood drawn at that visit and will request a PSA.  Inbox message sent to Dr. Tasia Catchings  Continue annual follow-up   Abbie Sons, MD  [facility] [address] [place], [place] [address] [phone]

## 2020-12-05 ENCOUNTER — Other Ambulatory Visit: Payer: Self-pay

## 2020-12-05 ENCOUNTER — Ambulatory Visit
Admission: RE | Admit: 2020-12-05 | Discharge: 2020-12-05 | Disposition: A | Discharge status: Home / Self Care | Payer: Medicare Other | Source: Ambulatory Visit | Attending: Radiation Oncology | Admitting: Radiation Oncology

## 2020-12-05 DIAGNOSIS — C3491 Malignant neoplasm of unspecified part of right bronchus or lung: Secondary | ICD-10-CM | POA: Diagnosis present

## 2020-12-13 ENCOUNTER — Inpatient Hospital Stay: Payer: Medicare Other | Attending: Oncology | Admitting: Oncology

## 2020-12-13 ENCOUNTER — Ambulatory Visit
Admission: RE | Admit: 2020-12-13 | Discharge: 2020-12-13 | Disposition: A | Discharge status: Home / Self Care | Payer: Medicare Other | Source: Ambulatory Visit | Attending: Radiation Oncology | Admitting: Radiation Oncology

## 2020-12-13 ENCOUNTER — Encounter: Payer: Self-pay | Admitting: Oncology

## 2020-12-13 VITALS — BP 112/73 | HR 82 | Temp 97.3°F | Resp 18 | Wt 221.6 lb

## 2020-12-13 DIAGNOSIS — E119 Type 2 diabetes mellitus without complications: Secondary | ICD-10-CM | POA: Diagnosis not present

## 2020-12-13 DIAGNOSIS — N62 Hypertrophy of breast: Secondary | ICD-10-CM | POA: Diagnosis not present

## 2020-12-13 DIAGNOSIS — M545 Low back pain, unspecified: Secondary | ICD-10-CM | POA: Diagnosis not present

## 2020-12-13 DIAGNOSIS — Z8546 Personal history of malignant neoplasm of prostate: Secondary | ICD-10-CM | POA: Diagnosis not present

## 2020-12-13 DIAGNOSIS — Z79899 Other long term (current) drug therapy: Secondary | ICD-10-CM | POA: Diagnosis not present

## 2020-12-13 DIAGNOSIS — C3491 Malignant neoplasm of unspecified part of right bronchus or lung: Secondary | ICD-10-CM

## 2020-12-13 DIAGNOSIS — G8929 Other chronic pain: Secondary | ICD-10-CM | POA: Diagnosis not present

## 2020-12-13 DIAGNOSIS — Z7901 Long term (current) use of anticoagulants: Secondary | ICD-10-CM | POA: Insufficient documentation

## 2020-12-13 DIAGNOSIS — Z7984 Long term (current) use of oral hypoglycemic drugs: Secondary | ICD-10-CM | POA: Diagnosis not present

## 2020-12-13 DIAGNOSIS — Z87891 Personal history of nicotine dependence: Secondary | ICD-10-CM | POA: Diagnosis not present

## 2020-12-13 DIAGNOSIS — Z950 Presence of cardiac pacemaker: Secondary | ICD-10-CM | POA: Insufficient documentation

## 2020-12-13 DIAGNOSIS — N1832 Chronic kidney disease, stage 3b: Secondary | ICD-10-CM | POA: Diagnosis not present

## 2020-12-13 DIAGNOSIS — Z85828 Personal history of other malignant neoplasm of skin: Secondary | ICD-10-CM | POA: Diagnosis not present

## 2020-12-13 DIAGNOSIS — I4891 Unspecified atrial fibrillation: Secondary | ICD-10-CM | POA: Diagnosis not present

## 2020-12-13 DIAGNOSIS — I10 Essential (primary) hypertension: Secondary | ICD-10-CM | POA: Insufficient documentation

## 2020-12-13 DIAGNOSIS — Z923 Personal history of irradiation: Secondary | ICD-10-CM | POA: Insufficient documentation

## 2020-12-13 DIAGNOSIS — R918 Other nonspecific abnormal finding of lung field: Secondary | ICD-10-CM | POA: Diagnosis present

## 2020-12-13 NOTE — Progress Notes (Signed)
Radiation Oncology Follow up Note  Name: Carlos Levine   Date:   12/13/2020 MRN:  [id number] DOB: November 21, 1940    This 80 y.o. male presents to the clinic today for 23-month follow-up status post SBRT to his right lung for stage I squamous cell carcinoma.  REFERRING PROVIDER: [name], MD  HPI: Patient is an 80 year old male now out 18 months having completed SBRT to his right lung for stage I squamous cell carcinoma using SBRT seen today in routine follow-up he has no significant changes in the pulmonary status still has a slight nonproductive cough no hemoptysis.  Continues to have shortness of breath..  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  He has 2 new groundglass nodules in the left upper lobe which are nonspecific and we will follow those with a repeat CT scan in 3 months.  COMPLICATIONS OF TREATMENT: none  FOLLOW UP COMPLIANCE: keeps appointments   PHYSICAL EXAM:  There were no vitals taken for this visit. Well-developed well-nourished patient in NAD. HEENT reveals PERLA, EOMI, discs not visualized.  Oral cavity is clear. No oral mucosal lesions are identified. Neck is clear without evidence of cervical or supraclavicular adenopathy. Lungs are clear to A&P. Cardiac examination is essentially unremarkable with regular rate and rhythm without murmur rub or thrill. Abdomen is benign with no organomegaly or masses noted. Motor sensory and DTR levels are equal and symmetric in the upper and lower extremities. Cranial nerves II through XII are grossly intact. Proprioception is intact. No peripheral adenopathy or edema is identified. No motor or sensory levels are noted. Crude visual fields are within normal range.  RADIOLOGY RESULTS: CT scan reviewed compatible with above-stated findings  PLAN: Present time patient is doing well.  He is seeing Dr. Tasia Catchings who is ordered a repeat CT scan in 3 months which I will review when it  becomes available.  Otherwise I am pleased with his overall progress.  I have asked to see him back in 6 months for follow-up.  Patient knows to call with any concerns.  I would like to take this opportunity to thank you for allowing me to participate in the care of your patient.Noreene Filbert, MD

## 2020-12-13 NOTE — Progress Notes (Signed)
Patient here for CT results.

## 2020-12-13 NOTE — Progress Notes (Signed)
Hematology/Oncology  [place] Telephone:([phone] [phone]   Patient Care Team: [name], MD as PCP - General (Internal Medicine) Telford Nab, RN as Registered Nurse  REFERRING PROVIDER: [name], MD  REASON FOR VISIT:  Follow-up for squamous cell carcinoma  HISTORY OF PRESENTING ILLNESS:   Carlos Levine is a  80 y.o.  male with PMH listed below was seen in consultation at the request of  [name], MD  for evaluation of lung mass.  Patient reports chronic SOB for about 1 year. Cough gets worse recently, productive with clear mucus.  No exacerbating or alleviating factors.  Former smoker, quit smoking in 2012.  Used to have 30-pack-year smoking history. Patient had a chest x-ray done followed by CT chest which showed lung mass and hilar lymphadenopathy.  Refer to heme-onc for further evaluation and management.  Remote history of prostate cancer, previously follows up with Dr. [name].  Patient is on Casodex 50 mg daily. Was last seen by Dr. [name] in 2018., Malignant neoplasm of prostate ([facility]) 02/2006  Gleason's 8 in 4 of 8 biopsy specimens all from the right side. PSA 12.2  PSA 01/23/2017 is <0.01  Chronic lower back pain and radiculopathy. Lives at home with wife, daughter and granddaughter  CT-guided biopsy on 04/13/2019. Pathology showed squamous cell lung cancer.  #There was plan for bronchoscopy/EBUS assisted lymph node biopsy  procedure was canceled as patient was found to be in SVT.  Patient was sent to emergency room for further evaluation. Work-up included CTA that was negative for PE.  Echocardiogram EF 55 to 60%. 05/26/2019 CT angiogram PE protocol showed a spiculated mass right posterior apical segment unchanged. Discussed with pulmonology, little mediastinum hilar lymph adenopathy was seen. Previous hilar lymphadenopathy probably reactive. Given patient's age, multiple comorbidities, discussed with  patient that EBUS assisted lymph node biopsy may not be beneficial.  Patient has establish care with radiation oncology and was recommended to proceed with SBRT. Discussed with pulmonology Dr.Aleskerov and he is in agreement.  #January 2021  SBRT of right lobe mass.  INTERVAL HISTORY Carlos Levine is a 80 y.o. male who has above history reviewed by me today presents for follow up visit for management of squamous cell lung cancer 07/25/2020.patient had pacemaker placement for treatment of sick sinus syndrome/proximal atrial fibrillation.    Patient has no new complaints today.  Denies any unintentional weight loss, hemoptysis. chronic shortness of breath not changed.    Review of Systems  Constitutional: Negative for appetite change, chills, fatigue, fever and unexpected weight change.  HENT:   Negative for hearing loss and voice change.   Eyes: Negative for eye problems and icterus.  Respiratory: Positive for shortness of breath. Negative for chest tightness and cough.   Cardiovascular: Negative for chest pain and leg swelling.  Gastrointestinal: Negative for abdominal distention and abdominal pain.  Endocrine: Negative for hot flashes.  Genitourinary: Negative for difficulty urinating, dysuria and frequency.   Musculoskeletal: Positive for back pain. Negative for arthralgias.  Skin: Negative for itching and rash.  Neurological: Negative for light-headedness and numbness.  Hematological: Negative for adenopathy. Does not bruise/bleed easily.  Psychiatric/Behavioral: Negative for confusion.    MEDICAL HISTORY:  Past Medical History:  Diagnosis Date   Arthritis    Cancer [facility])    prostate   Chronic kidney disease    Coronary artery disease    Diabetes mellitus without complication (HCC)    Dyspnea  with minimal exertion   Dysrhythmia    history of SVT   History of kidney stones    History of radiation therapy    lung   Hyperlipidemia    Hypertension     Peripheral vascular disease ([place])    Prostate cancer ([place])    Squamous cell lung cancer ([place]) 04/2020   also has history of skin cancer    SURGICAL HISTORY: Past Surgical History:  Procedure Laterality Date   AMPUTATION FINGER / THUMB Right    BACK SURGERY  1975   removed 2 discs and fused others   PACEMAKER LEADLESS INSERTION N/A 07/25/2020   Procedure: PACEMAKER LEADLESS INSERTION;  Surgeon: Isaias Cowman, MD;  Location: [facility];  Service: Cardiovascular;  Laterality: N/A;   PROSTATE CRYOABLATION     SPINAL FUSION     STENT PLACEMENT ILIAC ([place] [facility]) Left 2014   Left common iliac and left external iliac by Dr. Delana Meyer   VIDEO BRONCHOSCOPY WITH ENDOBRONCHIAL NAVIGATION N/A 05/30/2019   Procedure: [place], DIABETIC;  Surgeon: Ottie Glazier, MD;  Location: [facility];  Service: Thoracic;  Laterality: N/A;   VIDEO BRONCHOSCOPY WITH ENDOBRONCHIAL NAVIGATION N/A 05/28/2020   Procedure: VIDEO BRONCHOSCOPY WITH ENDOBRONCHIAL NAVIGATION;  Surgeon: Ottie Glazier, MD;  Location: [facility];  Service: Thoracic;  Laterality: N/A;   VIDEO BRONCHOSCOPY WITH ENDOBRONCHIAL ULTRASOUND N/A 05/30/2019   Procedure: VIDEO BRONCHOSCOPY WITH ENDOBRONCHIAL ULTRASOUND, DIABETIC;  Surgeon: Ottie Glazier, MD;  Location: [facility];  Service: Thoracic;  Laterality: N/A;   VIDEO BRONCHOSCOPY WITH ENDOBRONCHIAL ULTRASOUND N/A 05/28/2020   Procedure: VIDEO BRONCHOSCOPY WITH ENDOBRONCHIAL ULTRASOUND;  Surgeon: Ottie Glazier, MD;  Location: [facility];  Service: Thoracic;  Laterality: N/A;    SOCIAL HISTORY: Social History   Socioeconomic History   Marital status: Married    Spouse name: [name]   Number of children: 4   Years of education: Not on file   Highest education level: Not on file  Occupational History   Occupation: Games developer    Comment: retired  Tobacco Use   Smoking status: Former Smoker    Years: 30.00    Types: Cigarettes     Quit date: 10/20/2009    Years since quitting: 11.1   Smokeless tobacco: Never Used  Vaping Use   Vaping Use: Never used  Substance and Sexual Activity   Alcohol use: No   Drug use: No   Sexual activity: Yes    Birth control/protection: None  Other Topics Concern   Not on file  Social History Narrative   Patient lives with wife and occasionally daughter and granddaughter.   Social Determinants of Health   Financial Resource Strain: Not on file  Food Insecurity: Not on file  Transportation Needs: Not on file  Physical Activity: Not on file  Stress: Not on file  Social Connections: Not on file  Intimate Partner Violence: Not on file    FAMILY HISTORY: Family History  Problem Relation Age of Onset   Lung cancer Father    Pancreatic cancer Father    Brain cancer Mother    Stomach cancer Paternal Aunt    Stomach cancer Paternal Uncle    Cancer Maternal Grandmother    Kidney cancer Maternal Grandfather     ALLERGIES:  is allergic to contrast media [iodinated diagnostic agents] and ace inhibitors.  MEDICATIONS:  Current Outpatient Medications  Medication Sig Dispense Refill   apixaban (ELIQUIS) 2.5 MG TABS tablet Take 2.5 mg by mouth every 12 (twelve) hours.  bicalutamide (CASODEX) 50 MG tablet Take 1 tablet (50 mg total) by mouth daily at 12 noon. 90 tablet 3   docusate sodium (COLACE) 100 MG capsule Take 200 mg by mouth daily as needed for mild constipation.      ipratropium-albuterol (DUONEB) 0.5-2.5 (3) MG/3ML SOLN Take by nebulization.     pioglitazone (ACTOS) 15 MG tablet Take 15 mg by mouth daily.     pravastatin (PRAVACHOL) 80 MG tablet Take 80 mg by mouth at bedtime.      diltiazem (CARDIZEM CD) 120 MG 24 hr capsule Take 1 capsule (120 mg total) by mouth daily. (Patient not taking: Reported on 12/13/2020) 30 capsule 1   No current facility-administered medications for this visit.     PHYSICAL EXAMINATION: ECOG PERFORMANCE STATUS: 2 -  Symptomatic, <50% confined to bed Vitals:   12/13/20 1022  BP: 112/73  Pulse: 82  Resp: 18  Temp: (!) 97.3 F (36.3 C)   Filed Weights   12/13/20 1022  Weight: 221 lb 9.6 oz (100.5 kg)    Physical Exam Constitutional:      General: He is not in acute distress.    Comments: Frail appearance  HENT:     Head: Normocephalic and atraumatic.  Eyes:     General: No scleral icterus.    Pupils: Pupils are equal, round, and reactive to light.  Cardiovascular:     Rate and Rhythm: Normal rate and regular rhythm.     Comments: Premature heartbeats Pulmonary:     Effort: Pulmonary effort is normal. No respiratory distress.     Breath sounds: No wheezing.     Comments: Decreased breath sound bilaterally. Abdominal:     General: Bowel sounds are normal. There is no distension.     Palpations: Abdomen is soft. There is no mass.     Tenderness: There is no abdominal tenderness.  Musculoskeletal:        General: No deformity. Normal range of motion.     Cervical back: Normal range of motion and neck supple.  Skin:    General: Skin is warm and dry.     Findings: No erythema or rash.  Neurological:     Mental Status: He is alert and oriented to person, place, and time.     Cranial Nerves: No cranial nerve deficit.     Coordination: Coordination normal.  Psychiatric:        Mood and Affect: Mood normal.        Thought Content: Thought content normal.   Bilateral gynecomastia  LABORATORY DATA:  I have reviewed the data as listed Lab Results  Component Value Date   WBC 9.1 11/28/2019   HGB 13.1 11/28/2019   HCT 42.8 11/28/2019   MCV 93.2 11/28/2019   PLT 313 11/28/2019   Recent Labs    07/26/20 0621  NA 140  K 4.9  CL 108  CO2 26  GLUCOSE 164*  BUN 26*  CREATININE 1.64*  CALCIUM 9.0  GFRNONAA 39*   Iron/TIBC/Ferritin/ %Sat No results found for: IRON, TIBC, FERRITIN, IRONPCTSAT    RADIOGRAPHIC STUDIES: I have personally reviewed the radiological images as listed  and agreed with the findings in the report.  CT Chest Wo Contrast  Result Date: 12/06/2020 CLINICAL DATA:  80 year old male with history of non-small cell lung cancer status post SB RT to the right lung for stage I squamous cell carcinoma. Persistent cough since radiation therapy 1.5 years ago. EXAM: CT CHEST WITHOUT CONTRAST TECHNIQUE: Multidetector CT imaging  of the chest was performed following the standard protocol without IV contrast. COMPARISON:  Chest CT 06/04/2020. FINDINGS: Cardiovascular: Heart size is normal. No significant pericardial fluid. Some areas of pericardial thickening and calcification are noted, without definitive imaging findings suggestive of constrictive physiology on today's examination. There is aortic atherosclerosis, as well as atherosclerosis of the great vessels of the mediastinum and the coronary arteries, including calcified atherosclerotic plaque in the left main, left anterior descending, left circumflex and right coronary arteries. Severe calcifications of the aortic valve, mitral aortic intervalvular fibrosa and mitral annulus. Mediastinum/Nodes: No pathologically enlarged mediastinal or hilar lymph nodes. Please note that accurate exclusion of hilar adenopathy is limited on noncontrast CT scans. Esophagus is unremarkable in appearance. No axillary lymphadenopathy. Lungs/Pleura: Small right pleural effusion lying dependently, new compared to the prior examination. There continues to be a mass-like area of architectural distortion and volume loss in the right mid/upper lung involving portions of the right upper, right middle and right lower lobes, similar to prior examinations, most compatible with chronic postradiation mass-like fibrosis. In the left upper lobe (axial image 76 of series 3) there is a new 1.5 cm ground-glass attenuation nodule. Additional peripheral area of nodular ground-glass attenuation also noted in the left upper lobe on axial image 34 of series 3  measuring 1.7 x 1.4 cm. No acute consolidative airspace disease. No pleural effusions. Mild diffuse bronchial wall thickening with mild centrilobular and paraseptal emphysema. Upper Abdomen: Aortic atherosclerosis. Calcified gallstones lying dependently in the gallbladder measuring up to 1.4 cm in diameter. Low-attenuation lesions in both kidneys, largest of which is in the upper pole of the left kidney measuring up to 3.7 cm in diameter, incompletely characterized on today's non-contrast CT examination, but similar to prior studies and statistically likely to represent cysts. Musculoskeletal: There are no aggressive appearing lytic or blastic lesions noted in the visualized portions of the skeleton. IMPRESSION: 1. 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 2. New small right pleural effusion lying dependently. 3. Two new ground-glass attenuation nodules in the left upper lobe, largest of which measures up to 1.7 x 1.4 cm. These are nonspecific and could be of infectious or inflammatory etiology. Attention at time of repeat noncontrast chest CT is recommended in 6 months to ensure stability or regression of these findings. 4. Diffuse bronchial wall thickening with mild centrilobular and paraseptal emphysema; imaging findings suggestive of underlying COPD. 5. Aortic atherosclerosis, in addition to left main and 3 vessel coronary artery disease. 6. There are calcifications of the aortic valve and mitral annulus. Echocardiographic correlation for evaluation of potential valvular dysfunction may be warranted if clinically indicated. 7. Small amount of pericardial thickening calcification, without definitive imaging findings to suggest constrictive physiology. Aortic Atherosclerosis (ICD10-I70.0) and Emphysema (ICD10-J43.9). Electronically Signed   By: Vinnie Langton M.D.   On: 12/06/2020 09:04    ASSESSMENT & PLAN:  1. Squamous cell lung cancer, right (HCC)   2. Stage 3b  chronic kidney disease ([place])   3. History of prostate cancer   Cancer Staging Squamous cell lung cancer, right [facility]) Staging form: Lung, AJCC 8th Edition - Clinical stage from 04/20/2019: Stage IIA (cT2b, cN0, cM0) - Signed by Earlie Server, MD on 06/02/2019   # cT2b N0 M0 squamous cell lung cancer. Interval CT chest was reviewed by me and discussed with patient. 12/05/2020, CT schedulers stable appearance of chronic postradiation masslike fibrosis in the right lung.  There is small right pleural effusion.  2 new  groundglass attenuation nodules in the left upper lobe, up to 1.7 x 1.4 cm.  Nonspecific.  COPD/emphysema  Recommend patient to repeat CT scan in 6 months. Recommend patient to have CBC CMP done today and patient declined.  He prefers to do blood work when he sees primary care providers History of prostate cancer.  Patient follows up with urologist. Chronic gynecomastia.  Stable secondary to Casodex.    All questions were answered. The patient knows to call the clinic with any problems questions or concerns.  cc [name], MD   Orders Placed This Encounter  Procedures   CT Chest Wo Contrast    Standing Status:   Future    Standing Expiration Date:   12/13/2021    Order Specific Question:   Preferred imaging location?    Answer:   [facility]    Return of visit:  6 months  Earlie Server, MD, PhD Hematology Oncology [facility] Pager- [phone] 12/13/2020

## 2021-06-12 ENCOUNTER — Ambulatory Visit
Admission: RE | Admit: 2021-06-12 | Discharge: 2021-06-12 | Disposition: A | Discharge status: Home / Self Care | Payer: Medicare Other | Source: Ambulatory Visit | Attending: Oncology | Admitting: Oncology

## 2021-06-12 ENCOUNTER — Other Ambulatory Visit: Payer: Self-pay

## 2021-06-12 ENCOUNTER — Inpatient Hospital Stay: Payer: Medicare Other | Attending: Oncology

## 2021-06-12 DIAGNOSIS — C3491 Malignant neoplasm of unspecified part of right bronchus or lung: Secondary | ICD-10-CM

## 2021-06-12 DIAGNOSIS — Z79899 Other long term (current) drug therapy: Secondary | ICD-10-CM | POA: Insufficient documentation

## 2021-06-12 DIAGNOSIS — Z7901 Long term (current) use of anticoagulants: Secondary | ICD-10-CM | POA: Diagnosis not present

## 2021-06-12 DIAGNOSIS — Z8546 Personal history of malignant neoplasm of prostate: Secondary | ICD-10-CM | POA: Insufficient documentation

## 2021-06-12 DIAGNOSIS — N1832 Chronic kidney disease, stage 3b: Secondary | ICD-10-CM | POA: Insufficient documentation

## 2021-06-12 DIAGNOSIS — Z7984 Long term (current) use of oral hypoglycemic drugs: Secondary | ICD-10-CM | POA: Diagnosis not present

## 2021-06-12 DIAGNOSIS — N62 Hypertrophy of breast: Secondary | ICD-10-CM | POA: Diagnosis not present

## 2021-06-12 DIAGNOSIS — Z87891 Personal history of nicotine dependence: Secondary | ICD-10-CM | POA: Insufficient documentation

## 2021-06-12 DIAGNOSIS — Z85118 Personal history of other malignant neoplasm of bronchus and lung: Secondary | ICD-10-CM | POA: Diagnosis present

## 2021-06-12 LAB — CBC WITH DIFFERENTIAL/PLATELET
Abs Immature Granulocytes: 0.05 10*3/uL (ref 0.00–0.07)
Basophils Absolute: 0 10*3/uL (ref 0.0–0.1)
Basophils Relative: 0 %
Eosinophils Absolute: 0.1 10*3/uL (ref 0.0–0.5)
Eosinophils Relative: 1 %
HCT: 40.4 % (ref 39.0–52.0)
Hemoglobin: 12.8 g/dL — ABNORMAL LOW (ref 13.0–17.0)
Immature Granulocytes: 1 %
Lymphocytes Relative: 28 %
Lymphs Abs: 1.4 10*3/uL (ref 0.7–4.0)
MCH: 30.1 pg (ref 26.0–34.0)
MCHC: 31.7 g/dL (ref 30.0–36.0)
MCV: 95.1 fL (ref 80.0–100.0)
Monocytes Absolute: 0.5 10*3/uL (ref 0.1–1.0)
Monocytes Relative: 10 %
Neutro Abs: 2.9 10*3/uL (ref 1.7–7.7)
Neutrophils Relative %: 60 %
Platelets: 179 10*3/uL (ref 150–400)
RBC: 4.25 MIL/uL (ref 4.22–5.81)
RDW: 14.1 % (ref 11.5–15.5)
WBC: 4.9 10*3/uL (ref 4.0–10.5)
nRBC: 0 % (ref 0.0–0.2)

## 2021-06-12 LAB — COMPREHENSIVE METABOLIC PANEL
ALT: 10 U/L (ref 0–44)
AST: 19 U/L (ref 15–41)
Albumin: 3.8 g/dL (ref 3.5–5.0)
Alkaline Phosphatase: 35 U/L — ABNORMAL LOW (ref 38–126)
Anion gap: 9 (ref 5–15)
BUN: 29 mg/dL — ABNORMAL HIGH (ref 8–23)
CO2: 21 mmol/L — ABNORMAL LOW (ref 22–32)
Calcium: 8.7 mg/dL — ABNORMAL LOW (ref 8.9–10.3)
Chloride: 105 mmol/L (ref 98–111)
Creatinine, Ser: 1.77 mg/dL — ABNORMAL HIGH (ref 0.61–1.24)
GFR, Estimated: 38 mL/min — ABNORMAL LOW (ref >60)
Glucose, Bld: 122 mg/dL — ABNORMAL HIGH (ref 70–99)
Potassium: 4.4 mmol/L (ref 3.5–5.1)
Sodium: 135 mmol/L (ref 135–145)
Total Bilirubin: 1.1 mg/dL (ref 0.3–1.2)
Total Protein: 7.2 g/dL (ref 6.5–8.1)

## 2021-06-12 LAB — PSA: Prostatic Specific Antigen: <0.01 ng/mL (ref 0.00–4.00)

## 2021-06-14 ENCOUNTER — Inpatient Hospital Stay: Payer: Medicare Other | Admitting: Oncology

## 2021-06-14 ENCOUNTER — Encounter: Payer: Self-pay | Admitting: Oncology

## 2021-06-14 VITALS — BP 132/79 | HR 100 | Temp 98.2°F | Resp 18 | Wt 208.4 lb

## 2021-06-14 DIAGNOSIS — Z8546 Personal history of malignant neoplasm of prostate: Secondary | ICD-10-CM

## 2021-06-14 DIAGNOSIS — C3491 Malignant neoplasm of unspecified part of right bronchus or lung: Secondary | ICD-10-CM

## 2021-06-14 DIAGNOSIS — N1832 Chronic kidney disease, stage 3b: Secondary | ICD-10-CM

## 2021-06-14 DIAGNOSIS — Z85118 Personal history of other malignant neoplasm of bronchus and lung: Secondary | ICD-10-CM | POA: Diagnosis not present

## 2021-06-14 NOTE — Progress Notes (Signed)
Patient here for follow up. Pt reports increased numbness to hands.

## 2021-06-14 NOTE — Progress Notes (Signed)
Hematology/Oncology  [place] Telephone:([phone] [phone]   Patient Care Team: [name], MD as PCP - General (Internal Medicine) Telford Nab, RN as Registered Nurse  REFERRING PROVIDER: [name], MD  REASON FOR VISIT:  Follow-up for squamous cell carcinoma  HISTORY OF PRESENTING ILLNESS:   Carlos Levine is a  80 y.o.  male with PMH listed below was seen in consultation at the request of  [name], MD  for evaluation of lung mass.  Patient reports chronic SOB for about 1 year. Cough gets worse recently, productive with clear mucus.  No exacerbating or alleviating factors.  Former smoker, quit smoking in 2012.  Used to have 30-pack-year smoking history. Patient had a chest x-ray done followed by CT chest which showed lung mass and hilar lymphadenopathy.  Refer to heme-onc for further evaluation and management.  Remote history of prostate cancer, previously follows up with Dr. [name].  Patient is on Casodex 50 mg daily. Was last seen by Dr. [name] in 2018., Malignant neoplasm of prostate ([facility]) 02/2006  Gleason's 8 in 4 of 8 biopsy specimens all from the right side. PSA 12.2  PSA 01/23/2017 is <0.01  Chronic lower back pain and radiculopathy. Lives at home with wife, daughter and granddaughter  CT-guided biopsy on 04/13/2019. Pathology showed squamous cell lung cancer.  #There was plan for bronchoscopy/EBUS assisted lymph node biopsy  procedure was canceled as patient was found to be in SVT.  Patient was sent to emergency room for further evaluation. Work-up included CTA that was negative for PE.  Echocardiogram EF 55 to 60%. 05/26/2019 CT angiogram PE protocol showed a spiculated mass right posterior apical segment unchanged. Discussed with pulmonology, little mediastinum hilar lymph adenopathy was seen. Previous hilar lymphadenopathy probably reactive. Given patient's age, multiple comorbidities, discussed with  patient that EBUS assisted lymph node biopsy may not be beneficial.  Patient has establish care with radiation oncology and was recommended to proceed with SBRT. Discussed with pulmonology Dr.Aleskerov and he is in agreement.  #January 2021  SBRT of right lobe mass. 07/25/2020.patient had pacemaker placement for treatment of sick sinus syndrome/proximal atrial fibrillation.    12/05/2020, CT schedulers stable appearance of chronic postradiation masslike fibrosis in the right lung.  There is small right pleural effusion.  2 new groundglass attenuation nodules in the left upper lobe, up to 1.7 x 1.4 cm.  Nonspecific.  COPD/emphysema   INTERVAL HISTORY Carlos Levine is a 80 y.o. male who has above history reviewed by me today presents for follow up visit for management of squamous cell lung cancer   No new complaints. Chronic SOB unchanged.  He had CT done prior to this visit.    Review of Systems  Constitutional:  Negative for appetite change, chills, fatigue, fever and unexpected weight change.  HENT:   Negative for hearing loss and voice change.   Eyes:  Negative for eye problems and icterus.  Respiratory:  Positive for shortness of breath. Negative for chest tightness and cough.   Cardiovascular:  Negative for chest pain and leg swelling.  Gastrointestinal:  Negative for abdominal distention and abdominal pain.  Endocrine: Negative for hot flashes.  Genitourinary:  Negative for difficulty urinating, dysuria and frequency.   Musculoskeletal:  Positive for back pain. Negative for arthralgias.  Skin:  Negative for itching and rash.  Neurological:  Negative for light-headedness and numbness.  Hematological:  Negative for adenopathy. Does not bruise/bleed easily.  Psychiatric/Behavioral:  Negative for confusion.    MEDICAL HISTORY:  Past Medical History:  Diagnosis Date   Arthritis    Cancer [facility])    prostate   Chronic kidney disease    Coronary artery disease    Diabetes mellitus  without complication (HCC)    Dyspnea    with minimal exertion   Dysrhythmia    history of SVT   History of kidney stones    History of radiation therapy    lung   Hyperlipidemia    Hypertension    Peripheral vascular disease ([place])    Prostate cancer ([place])    Squamous cell lung cancer ([place]) 04/2020   also has history of skin cancer    SURGICAL HISTORY: Past Surgical History:  Procedure Laterality Date   AMPUTATION FINGER / THUMB Right    BACK SURGERY  1975   removed 2 discs and fused others   PACEMAKER LEADLESS INSERTION N/A 07/25/2020   Procedure: PACEMAKER LEADLESS INSERTION;  Surgeon: Isaias Cowman, MD;  Location: [place] [facility];  Service: Cardiovascular;  Laterality: N/A;   PROSTATE CRYOABLATION     SPINAL FUSION     STENT PLACEMENT ILIAC ([place] HX) Left 2014   Left common iliac and left external iliac by Dr. Delana Meyer   VIDEO BRONCHOSCOPY WITH ENDOBRONCHIAL NAVIGATION N/A 05/30/2019   Procedure: [place], DIABETIC;  Surgeon: Ottie Glazier, MD;  Location: [facility];  Service: Thoracic;  Laterality: N/A;   VIDEO BRONCHOSCOPY WITH ENDOBRONCHIAL NAVIGATION N/A 05/28/2020   Procedure: VIDEO BRONCHOSCOPY WITH ENDOBRONCHIAL NAVIGATION;  Surgeon: Ottie Glazier, MD;  Location: [facility];  Service: Thoracic;  Laterality: N/A;   VIDEO BRONCHOSCOPY WITH ENDOBRONCHIAL ULTRASOUND N/A 05/30/2019   Procedure: VIDEO BRONCHOSCOPY WITH ENDOBRONCHIAL ULTRASOUND, DIABETIC;  Surgeon: Ottie Glazier, MD;  Location: [facility];  Service: Thoracic;  Laterality: N/A;   VIDEO BRONCHOSCOPY WITH ENDOBRONCHIAL ULTRASOUND N/A 05/28/2020   Procedure: VIDEO BRONCHOSCOPY WITH ENDOBRONCHIAL ULTRASOUND;  Surgeon: Ottie Glazier, MD;  Location: [facility];  Service: Thoracic;  Laterality: N/A;    SOCIAL HISTORY: Social History   Socioeconomic History   Marital status: Married    Spouse name: [name]   Number of children: 4   Years of education: Not on file    Highest education level: Not on file  Occupational History   Occupation: Games developer    Comment: retired  Tobacco Use   Smoking status: Former    Years: 30.00    Types: Cigarettes    Quit date: 10/20/2009    Years since quitting: 11.6   Smokeless tobacco: Never  Vaping Use   Vaping Use: Never used  Substance and Sexual Activity   Alcohol use: No   Drug use: No   Sexual activity: Yes    Birth control/protection: None  Other Topics Concern   Not on file  Social History Narrative   Patient lives with wife and occasionally daughter and granddaughter.   Social Determinants of Health   Financial Resource Strain: Not on file  Food Insecurity: Not on file  Transportation Needs: Not on file  Physical Activity: Not on file  Stress: Not on file  Social Connections: Not on file  Intimate Partner Violence: Not on file    FAMILY HISTORY: Family History  Problem Relation Age of Onset   Lung cancer Father    Pancreatic cancer Father    Brain cancer Mother    Stomach cancer Paternal Aunt    Stomach cancer Paternal Uncle    Cancer Maternal Grandmother  Kidney cancer Maternal Grandfather     ALLERGIES:  is allergic to contrast media [iodinated diagnostic agents] and ace inhibitors.  MEDICATIONS:  Current Outpatient Medications  Medication Sig Dispense Refill   apixaban (ELIQUIS) 2.5 MG TABS tablet Take 2.5 mg by mouth every 12 (twelve) hours.      bicalutamide (CASODEX) 50 MG tablet Take 1 tablet (50 mg total) by mouth daily at 12 noon. 90 tablet 3   Fluticasone-Umeclidin-Vilant (TRELEGY ELLIPTA) 100-62.5-25 MCG/INH AEPB Inhale into the lungs 2 (two) times daily.     ipratropium-albuterol (DUONEB) 0.5-2.5 (3) MG/3ML SOLN Take 3 mLs by nebulization 2 (two) times daily.     pioglitazone (ACTOS) 15 MG tablet Take 15 mg by mouth daily.     pravastatin (PRAVACHOL) 80 MG tablet Take 80 mg by mouth at bedtime.      No current facility-administered medications for this visit.      PHYSICAL EXAMINATION: ECOG PERFORMANCE STATUS: 2 - Symptomatic, <50% confined to bed Vitals:   06/14/21 1011  BP: 132/79  Pulse: 100  Resp: 18  Temp: 98.2 F (36.8 C)  SpO2: 98%   Filed Weights   06/14/21 1011  Weight: 208 lb 6.4 oz (94.5 kg)    Physical Exam Constitutional:      General: He is not in acute distress.    Comments: Frail appearance  HENT:     Head: Normocephalic and atraumatic.  Eyes:     General: No scleral icterus.    Pupils: Pupils are equal, round, and reactive to light.  Cardiovascular:     Rate and Rhythm: Normal rate and regular rhythm.     Comments: Premature heartbeats Pulmonary:     Effort: Pulmonary effort is normal. No respiratory distress.     Breath sounds: No wheezing.     Comments: Decreased breath sound bilaterally. Abdominal:     General: Bowel sounds are normal. There is no distension.     Palpations: Abdomen is soft. There is no mass.     Tenderness: There is no abdominal tenderness.  Musculoskeletal:        General: No deformity. Normal range of motion.     Cervical back: Normal range of motion and neck supple.  Skin:    General: Skin is warm and dry.     Findings: No erythema or rash.  Neurological:     Mental Status: He is alert and oriented to person, place, and time.     Cranial Nerves: No cranial nerve deficit.     Coordination: Coordination normal.  Psychiatric:        Mood and Affect: Mood normal.        Thought Content: Thought content normal.  Bilateral gynecomastia  LABORATORY DATA:  I have reviewed the data as listed Lab Results  Component Value Date   WBC 4.9 06/12/2021   HGB 12.8 (L) 06/12/2021   HCT 40.4 06/12/2021   MCV 95.1 06/12/2021   PLT 179 06/12/2021   Recent Labs    07/26/20 0621 06/12/21 1106  NA 140 135  K 4.9 4.4  CL 108 105  CO2 26 21*  GLUCOSE 164* 122*  BUN 26* 29*  CREATININE 1.64* 1.77*  CALCIUM 9.0 8.7*  GFRNONAA 39* 38*  PROT  --  7.2  ALBUMIN  --  3.8  AST  --  19   ALT  --  10  ALKPHOS  --  35*  BILITOT  --  1.1    Iron/TIBC/Ferritin/ %Sat No results found for:  IRON, TIBC, FERRITIN, IRONPCTSAT    RADIOGRAPHIC STUDIES: I have personally reviewed the radiological images as listed and agreed with the findings in the report.  CT Chest Wo Contrast  Result Date: 06/13/2021 CLINICAL DATA:  Lung cancer follow-up n/a 80 year old male. History of prior radiotherapy. EXAM: CT CHEST WITHOUT CONTRAST TECHNIQUE: Multidetector CT imaging of the chest was performed following the standard protocol without IV contrast. COMPARISON:  Comparison is made with December 05, 2020. FINDINGS: Cardiovascular: Calcified atheromatous plaque of the thoracic aorta. No aneurysmal dilation. Three-vessel coronary artery disease. Stable heart size. Mediastinum/Nodes: Esophagus mildly patulous. Stable size of small, scattered mediastinal lymph nodes. No internal mammary adenopathy. No thoracic inlet lymphadenopathy or axillary lymphadenopathy. Lungs/Pleura: Parenchymal consolidation and bronchiectatic changes with parenchymal distortion compatible with post treatment changes in the RIGHT chest measuring approximately 8 cm greatest dimension. No significant change when compared to prior imaging with involvement of upper, middle and lower lobe and associated with fissural distortion. Stable small RIGHT pleural effusion. Extends from the hilum to the pleural surface along the RIGHT lateral chest. Background pulmonary emphysema. Airways are patent. Upper Abdomen: No acute upper abdominal process. Cholelithiasis. Adrenal glands are normal. Musculoskeletal: Spinal degenerative changes. No sign of acute or destructive bone process. IMPRESSION: Stable post treatment changes in the RIGHT chest. Resolution of ground-glass nodules that were seen previously likely post infectious or inflammatory. Stable small RIGHT pleural effusion. Three-vessel coronary artery disease. Cholelithiasis. Emphysema and aortic  atherosclerosis. Aortic Atherosclerosis (ICD10-I70.0) and Emphysema (ICD10-J43.9). Electronically Signed   By: Zetta Bills M.D.   On: 06/13/2021 11:00     ASSESSMENT & PLAN:  1. Squamous cell lung cancer, right ([place])   2. History of prostate cancer   3. Stage 3b chronic kidney disease ([place])   Cancer Staging Squamous cell lung cancer, right ([place]) Staging form: Lung, AJCC 8th Edition - Clinical stage from 04/20/2019: Stage IIA (cT2b, cN0, cM0) - Signed by Earlie Server, MD on 06/02/2019   # cT2b N0 M0 squamous cell lung cancer. Interval CT chest was reviewed by me and discussed with patient. 06/10/2021 CT showed a stable treatment changes of the right chest.  Resolution of groundglass nodules that were seen previously.  Likely postinfectious or inflammatory.  Stable small right pleural effusion.  Three-vessel coronary artery disease.  Cholelithiasis.  Emphysema and aortic atherosclerosis. Labs were reviewed and discussed with patient.  #Chronic kidney disease-avoid nephrotoxin.  History of prostate cancer.  Patient follows up with urologist.  PSA is less than 0.01 Chronic gynecomastia.  Stable secondary to Casodex.    All questions were answered. The patient knows to call the clinic with any problems questions or concerns.  cc [name], MD   Orders Placed This Encounter  Procedures   CT Chest Wo Contrast    Standing Status:   Future    Standing Expiration Date:   06/14/2022    Order Specific Question:   Preferred imaging location?    Answer:   [facility]   CBC with Differential/Platelet    Standing Status:   Future    Standing Expiration Date:   06/14/2022   Comprehensive metabolic panel    Standing Status:   Future    Standing Expiration Date:   06/14/2022    Return of visit:  6 months  Earlie Server, MD, PhD Hematology Oncology [facility] Pager- [phone] 06/14/2021

## 2021-06-18 ENCOUNTER — Ambulatory Visit: Payer: Medicare Other | Admitting: Radiation Oncology

## 2021-07-10 ENCOUNTER — Ambulatory Visit: Payer: Medicare Other | Admitting: Radiation Oncology

## 2021-07-24 ENCOUNTER — Ambulatory Visit: Payer: Medicare Other | Admitting: Radiation Oncology

## 2021-11-22 ENCOUNTER — Other Ambulatory Visit: Payer: Self-pay

## 2021-11-22 DIAGNOSIS — C61 Malignant neoplasm of prostate: Secondary | ICD-10-CM

## 2021-11-25 ENCOUNTER — Other Ambulatory Visit: Payer: Self-pay

## 2021-11-25 ENCOUNTER — Other Ambulatory Visit: Payer: Medicare Other

## 2021-11-25 DIAGNOSIS — C61 Malignant neoplasm of prostate: Secondary | ICD-10-CM

## 2021-11-26 LAB — PSA: Prostate Specific Ag, Serum: <0.1 ng/mL (ref 0.0–4.0)

## 2021-11-28 ENCOUNTER — Encounter: Payer: Self-pay | Admitting: Urology

## 2021-11-28 ENCOUNTER — Ambulatory Visit: Payer: Medicare Other | Admitting: Urology

## 2021-11-28 ENCOUNTER — Other Ambulatory Visit: Payer: Self-pay

## 2021-11-28 VITALS — BP 118/71 | HR 121 | Ht 68.0 in | Wt 190.0 lb

## 2021-11-28 DIAGNOSIS — C61 Malignant neoplasm of prostate: Secondary | ICD-10-CM | POA: Diagnosis not present

## 2021-11-28 MED ORDER — BICALUTAMIDE 50 MG PO TABS: 50.0000 mg | ORAL_TABLET | Freq: Every day | ORAL | 3 refills | Status: DC

## 2021-11-28 NOTE — Progress Notes (Signed)
11/28/2021 1:03 PM   [name] May 09, 1941 [id number]  Referring provider: [name], MD [facility] [address] [place],  [place] [address]  Chief Complaint  Patient presents with   Prostate Cancer    Urologic history: 1.  Adenocarcinoma prostate-high risk             -Prostate biopsy 02/2006; PSA 12.2             -4/8 right-sided biopsies positive Gleason 4+4 adenocarcinoma             -Treated with cryoablation 04/2006             -Started bicalutamide June 2015 for slowly rising PSA @ 4.5             -PSA since 2016 has been <0.1  HPI: 81 y.o. male presents for annual follow-up.  Doing well since last visit No bothersome LUTS Denies dysuria, gross hematuria Denies flank, abdominal or pelvic pain Remains on Casodex 50 mg daily PSA 11/25/2021 <0.1; LFTs 07/2021 were normal   PMH: Past Medical History:  Diagnosis Date   Arthritis    Cancer ([place])    prostate   Chronic kidney disease    Coronary artery disease    Diabetes mellitus without complication (HCC)    Dyspnea    with minimal exertion   Dysrhythmia    history of SVT   History of kidney stones    History of radiation therapy    lung   Hyperlipidemia    Hypertension    Peripheral vascular disease ([place])    Prostate cancer ([place])    Squamous cell lung cancer ([place]) 04/2020   also has history of skin cancer    Surgical History: Past Surgical History:  Procedure Laterality Date   AMPUTATION FINGER / THUMB Right    BACK SURGERY  1975   removed 2 discs and fused others   PACEMAKER LEADLESS INSERTION N/A 07/25/2020   Procedure: PACEMAKER LEADLESS INSERTION;  Surgeon: Isaias Cowman, MD;  Location: [place] [facility];  Service: Cardiovascular;  Laterality: N/A;   PROSTATE CRYOABLATION     SPINAL FUSION     STENT PLACEMENT ILIAC ([place] HX) Left 2014   Left common iliac and left external iliac by Dr. Delana Meyer   VIDEO BRONCHOSCOPY WITH ENDOBRONCHIAL NAVIGATION N/A 05/30/2019    Procedure: VIDEO BRONCHOSCOPY WITH ENDOBRONCHIAL NAVIGATION, DIABETIC;  Surgeon: Ottie Glazier, MD;  Location: [facility];  Service: Thoracic;  Laterality: N/A;   VIDEO BRONCHOSCOPY WITH ENDOBRONCHIAL NAVIGATION N/A 05/28/2020   Procedure: VIDEO BRONCHOSCOPY WITH ENDOBRONCHIAL NAVIGATION;  Surgeon: Ottie Glazier, MD;  Location: [facility];  Service: Thoracic;  Laterality: N/A;   VIDEO BRONCHOSCOPY WITH ENDOBRONCHIAL ULTRASOUND N/A 05/30/2019   Procedure: VIDEO BRONCHOSCOPY WITH ENDOBRONCHIAL ULTRASOUND, DIABETIC;  Surgeon: Ottie Glazier, MD;  Location: [facility];  Service: Thoracic;  Laterality: N/A;   VIDEO BRONCHOSCOPY WITH ENDOBRONCHIAL ULTRASOUND N/A 05/28/2020   Procedure: VIDEO BRONCHOSCOPY WITH ENDOBRONCHIAL ULTRASOUND;  Surgeon: Ottie Glazier, MD;  Location: [facility];  Service: Thoracic;  Laterality: N/A;    Home Medications:  Allergies as of 11/28/2021       Reactions   Contrast Media [iodinated Contrast Media] Hives, Rash   Ace Inhibitors Other (See Comments)   Unknown reaction type        Medication List        Accurate as of November 28, 2021  1:03 PM. If you have any questions, ask your nurse or doctor.  apixaban 2.5 MG Tabs tablet Commonly known as: ELIQUIS Take 2.5 mg by mouth every 12 (twelve) hours.   bicalutamide 50 MG tablet Commonly known as: CASODEX Take 1 tablet (50 mg total) by mouth daily at 12 noon.   ipratropium-albuterol 0.5-2.5 (3) MG/3ML Soln Commonly known as: DUONEB Take 3 mLs by nebulization 2 (two) times daily.   pioglitazone 15 MG tablet Commonly known as: ACTOS Take 15 mg by mouth daily.   pravastatin 80 MG tablet Commonly known as: PRAVACHOL Take 80 mg by mouth at bedtime.   Trelegy Ellipta 100-62.5-25 MCG/ACT Aepb Generic drug: Fluticasone-Umeclidin-Vilant Inhale into the lungs 2 (two) times daily.        Allergies:  Allergies  Allergen Reactions   Contrast Media [Iodinated Contrast Media] Hives and Rash   Ace  Inhibitors Other (See Comments)    Unknown reaction type    Family History: Family History  Problem Relation Age of Onset   Lung cancer Father    Pancreatic cancer Father    Brain cancer Mother    Stomach cancer Paternal Aunt    Stomach cancer Paternal Uncle    Cancer Maternal Grandmother    Kidney cancer Maternal Grandfather     Social History:  reports that he quit smoking about 12 years ago. His smoking use included cigarettes. He has never used smokeless tobacco. He reports that he does not drink alcohol and does not use drugs.   Physical Exam: BP 118/71    Pulse (!) 121    Ht 5\' 8"  (1.727 m)    Wt 190 lb (86.2 kg)    BMI 28.89 kg/m   Constitutional:  Alert and oriented, No acute distress. HEENT: [place] AT, moist mucus membranes.  Trachea midline, no masses. Cardiovascular: No clubbing, cyanosis, or edema. Respiratory: Normal respiratory effort, no increased work of breathing. Psychiatric: Normal mood and affect.   Assessment & Plan:    1.  High risk prostate cancer Status post cryoablation with biochemical recurrence PSA undetectable on bicalutamide Bicalutamide refilled Will move to annual follow-up with [place], MD  [place] [address] [place], [place] [address] [phone]

## 2021-12-16 ENCOUNTER — Other Ambulatory Visit: Payer: Self-pay

## 2021-12-16 ENCOUNTER — Ambulatory Visit
Admission: RE | Admit: 2021-12-16 | Discharge: 2021-12-16 | Disposition: A | Discharge status: Home / Self Care | Payer: Medicare Other | Source: Ambulatory Visit | Attending: Oncology | Admitting: Oncology

## 2021-12-16 DIAGNOSIS — C3491 Malignant neoplasm of unspecified part of right bronchus or lung: Secondary | ICD-10-CM | POA: Insufficient documentation

## 2021-12-18 ENCOUNTER — Other Ambulatory Visit: Payer: Self-pay

## 2021-12-18 ENCOUNTER — Inpatient Hospital Stay: Payer: Medicare Other | Admitting: Oncology

## 2021-12-18 ENCOUNTER — Encounter: Payer: Self-pay | Admitting: Oncology

## 2021-12-18 ENCOUNTER — Inpatient Hospital Stay: Payer: Medicare Other | Attending: Oncology

## 2021-12-18 VITALS — BP 126/64 | HR 47 | Temp 96.4°F | Wt 185.0 lb

## 2021-12-18 DIAGNOSIS — I480 Paroxysmal atrial fibrillation: Secondary | ICD-10-CM | POA: Diagnosis not present

## 2021-12-18 DIAGNOSIS — R634 Abnormal weight loss: Secondary | ICD-10-CM | POA: Diagnosis not present

## 2021-12-18 DIAGNOSIS — Z79899 Other long term (current) drug therapy: Secondary | ICD-10-CM | POA: Diagnosis not present

## 2021-12-18 DIAGNOSIS — N189 Chronic kidney disease, unspecified: Secondary | ICD-10-CM | POA: Diagnosis not present

## 2021-12-18 DIAGNOSIS — Z7189 Other specified counseling: Secondary | ICD-10-CM | POA: Diagnosis not present

## 2021-12-18 DIAGNOSIS — I495 Sick sinus syndrome: Secondary | ICD-10-CM | POA: Diagnosis not present

## 2021-12-18 DIAGNOSIS — C3491 Malignant neoplasm of unspecified part of right bronchus or lung: Secondary | ICD-10-CM

## 2021-12-18 DIAGNOSIS — Z950 Presence of cardiac pacemaker: Secondary | ICD-10-CM | POA: Diagnosis not present

## 2021-12-18 DIAGNOSIS — Z87891 Personal history of nicotine dependence: Secondary | ICD-10-CM | POA: Insufficient documentation

## 2021-12-18 DIAGNOSIS — Z8546 Personal history of malignant neoplasm of prostate: Secondary | ICD-10-CM | POA: Diagnosis not present

## 2021-12-18 DIAGNOSIS — Z7984 Long term (current) use of oral hypoglycemic drugs: Secondary | ICD-10-CM | POA: Diagnosis not present

## 2021-12-18 DIAGNOSIS — Z7901 Long term (current) use of anticoagulants: Secondary | ICD-10-CM | POA: Diagnosis not present

## 2021-12-18 LAB — COMPREHENSIVE METABOLIC PANEL WITH GFR
ALT: 9 U/L (ref 0–44)
AST: 16 U/L (ref 15–41)
Albumin: 3.1 g/dL — ABNORMAL LOW (ref 3.5–5.0)
Alkaline Phosphatase: 38 U/L (ref 38–126)
Anion gap: 7 (ref 5–15)
BUN: 19 mg/dL (ref 8–23)
CO2: 24 mmol/L (ref 22–32)
Calcium: 8.7 mg/dL — ABNORMAL LOW (ref 8.9–10.3)
Chloride: 106 mmol/L (ref 98–111)
Creatinine, Ser: 1.53 mg/dL — ABNORMAL HIGH (ref 0.61–1.24)
GFR, Estimated: 45 mL/min — ABNORMAL LOW (ref >60)
Glucose, Bld: 110 mg/dL — ABNORMAL HIGH (ref 70–99)
Potassium: 4.7 mmol/L (ref 3.5–5.1)
Sodium: 137 mmol/L (ref 135–145)
Total Bilirubin: 0.5 mg/dL (ref 0.3–1.2)
Total Protein: 7.2 g/dL (ref 6.5–8.1)

## 2021-12-18 LAB — CBC WITH DIFFERENTIAL/PLATELET
Abs Immature Granulocytes: 0.06 K/uL (ref 0.00–0.07)
Basophils Absolute: 0 K/uL (ref 0.0–0.1)
Basophils Relative: 1 %
Eosinophils Absolute: 0.1 K/uL (ref 0.0–0.5)
Eosinophils Relative: 1 %
HCT: 40.5 % (ref 39.0–52.0)
Hemoglobin: 12.5 g/dL — ABNORMAL LOW (ref 13.0–17.0)
Immature Granulocytes: 1 %
Lymphocytes Relative: 26 %
Lymphs Abs: 1.7 K/uL (ref 0.7–4.0)
MCH: 28.9 pg (ref 26.0–34.0)
MCHC: 30.9 g/dL (ref 30.0–36.0)
MCV: 93.5 fL (ref 80.0–100.0)
Monocytes Absolute: 0.9 K/uL (ref 0.1–1.0)
Monocytes Relative: 13 %
Neutro Abs: 3.8 K/uL (ref 1.7–7.7)
Neutrophils Relative %: 58 %
Platelets: 282 K/uL (ref 150–400)
RBC: 4.33 MIL/uL (ref 4.22–5.81)
RDW: 14.2 % (ref 11.5–15.5)
WBC: 6.5 K/uL (ref 4.0–10.5)
nRBC: 0 % (ref 0.0–0.2)

## 2021-12-18 NOTE — Progress Notes (Signed)
Hematology/Oncology Progress note [phone] [phone]      Patient Care Team: [name], MD as PCP - General (Internal Medicine) Telford Nab, RN as Registered Nurse  REFERRING PROVIDER: [name], MD  REASON FOR VISIT:  Follow-up for squamous cell carcinoma  HISTORY OF PRESENTING ILLNESS:   Carlos Levine is a  81 y.o.  male with PMH listed below was seen in consultation at the request of  [name], MD  for evaluation of lung mass.  Patient reports chronic SOB for about 1 year. Cough gets worse recently, productive with clear mucus.  No exacerbating or alleviating factors.  Former smoker, quit smoking in 2012.  Used to have 30-pack-year smoking history. Patient had a chest x-ray done followed by CT chest which showed lung mass and hilar lymphadenopathy.  Refer to heme-onc for further evaluation and management.  Remote history of prostate cancer, previously follows up with Dr. [name].  Patient is on Casodex 50 mg daily. Was last seen by Dr. [name] in 2018., Malignant neoplasm of prostate ([facility]) 02/2006  Gleason's 8 in 4 of 8 biopsy specimens all from the right side. PSA 12.2  PSA 01/23/2017 is <0.01  Chronic lower back pain and radiculopathy. Lives at home with wife, daughter and granddaughter  CT-guided biopsy on 04/13/2019. Pathology showed squamous cell lung cancer.  #There was plan for bronchoscopy/EBUS assisted lymph node biopsy  procedure was canceled as patient was found to be in SVT.  Patient was sent to emergency room for further evaluation. Work-up included CTA that was negative for PE.  Echocardiogram EF 55 to 60%. 05/26/2019 CT angiogram PE protocol showed a spiculated mass right posterior apical segment unchanged. Discussed with pulmonology, little mediastinum hilar lymph adenopathy was seen. Previous hilar lymphadenopathy probably reactive. Given patient's age, multiple comorbidities, discussed with patient that EBUS  assisted lymph node biopsy may not be beneficial.  Patient has establish care with radiation oncology and was recommended to proceed with SBRT. Discussed with pulmonology Dr.Aleskerov and he is in agreement.  #January 2021  SBRT of right lobe mass. 07/25/2020.patient had pacemaker placement for treatment of sick sinus syndrome/proximal atrial fibrillation.    12/05/2020, CT schedulers stable appearance of chronic postradiation masslike fibrosis in the right lung.  There is small right pleural effusion.  2 new groundglass attenuation nodules in the left upper lobe, up to 1.7 x 1.4 cm.  Nonspecific.  COPD/emphysema   06/10/2021 CT showed a stable treatment changes of the right chest.  Resolution of groundglass nodules that were seen previously.  Likely postinfectious or inflammatory.  Stable small right pleural effusion.  Three-vessel coronary artery disease.  Cholelithiasis.  Emphysema and aortic atherosclerosis.  INTERVAL HISTORY Carlos Levine is a 81 y.o. male who has above history reviewed by me today presents for follow up visit for management of squamous cell lung cancer Patient was previously on Eliquis 2.5mg  bid for paroxysmal atrial fibrillation. For the past few weeks, he has noticed streaks of blood in his sputum.  He self stopped the Eliquis He has noted worsening of chest congestion, cough. Patient has lost weight.  23 pounds over the past 6 months. His wife had a stroke and does not cook now.  His diet is composed of frozen meals, sandwiches.   Review of Systems  Constitutional:  Positive for unexpected weight change. Negative for appetite change, chills, fatigue and fever.  HENT:   Negative for hearing loss and voice change.   Eyes:  Negative for eye problems and icterus.  Respiratory:  Positive for shortness of breath. Negative for chest tightness and cough.   Cardiovascular:  Negative for chest pain and leg swelling.  Gastrointestinal:  Negative for abdominal distention and  abdominal pain.  Endocrine: Negative for hot flashes.  Genitourinary:  Negative for difficulty urinating, dysuria and frequency.   Musculoskeletal:  Positive for back pain. Negative for arthralgias.  Skin:  Negative for itching and rash.  Neurological:  Negative for light-headedness and numbness.  Hematological:  Negative for adenopathy. Does not bruise/bleed easily.  Psychiatric/Behavioral:  Negative for confusion.    MEDICAL HISTORY:  Past Medical History:  Diagnosis Date   Arthritis    Cancer [facility])    prostate   Chronic kidney disease    Coronary artery disease    Diabetes mellitus without complication (HCC)    Dyspnea    with minimal exertion   Dysrhythmia    history of SVT   History of kidney stones    History of radiation therapy    lung   Hyperlipidemia    Hypertension    Peripheral vascular disease ([place])    Prostate cancer ([place])    Squamous cell lung cancer ([place]) 04/2020   also has history of skin cancer    SURGICAL HISTORY: Past Surgical History:  Procedure Laterality Date   AMPUTATION FINGER / THUMB Right    BACK SURGERY  1975   removed 2 discs and fused others   PACEMAKER LEADLESS INSERTION N/A 07/25/2020   Procedure: PACEMAKER LEADLESS INSERTION;  Surgeon: Isaias Cowman, MD;  Location: [place] [facility];  Service: Cardiovascular;  Laterality: N/A;   PROSTATE CRYOABLATION     SPINAL FUSION     STENT PLACEMENT ILIAC ([place] HX) Left 2014   Left common iliac and left external iliac by Dr. Delana Meyer   VIDEO BRONCHOSCOPY WITH ENDOBRONCHIAL NAVIGATION N/A 05/30/2019   Procedure: [place], DIABETIC;  Surgeon: Ottie Glazier, MD;  Location: [facility];  Service: Thoracic;  Laterality: N/A;   VIDEO BRONCHOSCOPY WITH ENDOBRONCHIAL NAVIGATION N/A 05/28/2020   Procedure: VIDEO BRONCHOSCOPY WITH ENDOBRONCHIAL NAVIGATION;  Surgeon: Ottie Glazier, MD;  Location: [facility];  Service: Thoracic;  Laterality: N/A;   VIDEO  BRONCHOSCOPY WITH ENDOBRONCHIAL ULTRASOUND N/A 05/30/2019   Procedure: VIDEO BRONCHOSCOPY WITH ENDOBRONCHIAL ULTRASOUND, DIABETIC;  Surgeon: Ottie Glazier, MD;  Location: [facility];  Service: Thoracic;  Laterality: N/A;   VIDEO BRONCHOSCOPY WITH ENDOBRONCHIAL ULTRASOUND N/A 05/28/2020   Procedure: VIDEO BRONCHOSCOPY WITH ENDOBRONCHIAL ULTRASOUND;  Surgeon: Ottie Glazier, MD;  Location: [facility];  Service: Thoracic;  Laterality: N/A;    SOCIAL HISTORY: Social History   Socioeconomic History   Marital status: Married    Spouse name: [name]   Number of children: 4   Years of education: Not on file   Highest education level: Not on file  Occupational History   Occupation: Games developer    Comment: retired  Tobacco Use   Smoking status: Former    Years: 30.00    Types: Cigarettes    Quit date: 10/20/2009    Years since quitting: 12.1   Smokeless tobacco: Never  Vaping Use   Vaping Use: Never used  Substance and Sexual Activity   Alcohol use: No   Drug use: No   Sexual activity: Yes    Birth control/protection: None  Other Topics Concern   Not on file  Social History Narrative   Patient lives with wife and occasionally daughter and granddaughter.   Social Determinants  of Health   Financial Resource Strain: Not on file  Food Insecurity: Not on file  Transportation Needs: Not on file  Physical Activity: Not on file  Stress: Not on file  Social Connections: Not on file  Intimate Partner Violence: Not on file    FAMILY HISTORY: Family History  Problem Relation Age of Onset   Lung cancer Father    Pancreatic cancer Father    Brain cancer Mother    Stomach cancer Paternal Aunt    Stomach cancer Paternal Uncle    Cancer Maternal Grandmother    Kidney cancer Maternal Grandfather     ALLERGIES:  is allergic to contrast media [iodinated contrast media] and ace inhibitors.  MEDICATIONS:  Current Outpatient Medications  Medication Sig Dispense Refill   bicalutamide (CASODEX)  50 MG tablet Take 1 tablet (50 mg total) by mouth daily at 12 noon. 90 tablet 3   Fluticasone-Umeclidin-Vilant (TRELEGY ELLIPTA) 100-62.5-25 MCG/INH AEPB Inhale into the lungs 2 (two) times daily.     ipratropium-albuterol (DUONEB) 0.5-2.5 (3) MG/3ML SOLN Take 3 mLs by nebulization 2 (two) times daily.     pioglitazone (ACTOS) 15 MG tablet Take 15 mg by mouth daily.     pravastatin (PRAVACHOL) 80 MG tablet Take 80 mg by mouth at bedtime.      apixaban (ELIQUIS) 2.5 MG TABS tablet Take 2.5 mg by mouth every 12 (twelve) hours.      No current facility-administered medications for this visit.     PHYSICAL EXAMINATION: ECOG PERFORMANCE STATUS: 2 - Symptomatic, <50% confined to bed Vitals:   12/18/21 1009  BP: 126/64  Pulse: (!) 47  Temp: (!) 96.4 F (35.8 C)  SpO2: 97%   Filed Weights   12/18/21 1009  Weight: 185 lb (83.9 kg)    Physical Exam Constitutional:      General: He is not in acute distress.    Comments: Frail appearance  HENT:     Head: Normocephalic and atraumatic.  Eyes:     General: No scleral icterus.    Pupils: Pupils are equal, round, and reactive to light.  Cardiovascular:     Rate and Rhythm: Normal rate and regular rhythm.     Comments: Premature heartbeats Pulmonary:     Effort: Pulmonary effort is normal. No respiratory distress.     Breath sounds: No wheezing.     Comments: Decreased breath sound bilaterally. Abdominal:     General: Bowel sounds are normal. There is no distension.     Palpations: Abdomen is soft. There is no mass.     Tenderness: There is no abdominal tenderness.  Musculoskeletal:        General: No deformity. Normal range of motion.     Cervical back: Normal range of motion and neck supple.  Skin:    General: Skin is warm and dry.     Findings: No erythema or rash.  Neurological:     Mental Status: He is alert and oriented to person, place, and time.     Cranial Nerves: No cranial nerve deficit.     Coordination: Coordination  normal.  Psychiatric:        Mood and Affect: Mood normal.        Thought Content: Thought content normal.  Bilateral gynecomastia  LABORATORY DATA:  I have reviewed the data as listed Lab Results  Component Value Date   WBC 6.5 12/18/2021   HGB 12.5 (L) 12/18/2021   HCT 40.5 12/18/2021   MCV 93.5 12/18/2021  PLT 282 12/18/2021   Recent Labs    06/12/21 1106 12/18/21 0948  NA 135 137  K 4.4 4.7  CL 105 106  CO2 21* 24  GLUCOSE 122* 110*  BUN 29* 19  CREATININE 1.77* 1.53*  CALCIUM 8.7* 8.7*  GFRNONAA 38* 45*  PROT 7.2 7.2  ALBUMIN 3.8 3.1*  AST 19 16  ALT 10 9  ALKPHOS 35* 38  BILITOT 1.1 0.5    Iron/TIBC/Ferritin/ %Sat No results found for: IRON, TIBC, FERRITIN, IRONPCTSAT    RADIOGRAPHIC STUDIES: I have personally reviewed the radiological images as listed and agreed with the findings in the report.  CT Chest Wo Contrast  Result Date: 12/17/2021 CLINICAL DATA:  Lung cancer restaging. EXAM: CT CHEST WITHOUT CONTRAST TECHNIQUE: Multidetector CT imaging of the chest was performed following the standard protocol without IV contrast. RADIATION DOSE REDUCTION: This exam was performed according to the departmental dose-optimization program which includes automated exposure control, adjustment of the mA and/or kV according to patient size and/or use of iterative reconstruction technique. COMPARISON:  06/12/2021 FINDINGS: Cardiovascular: The heart size is normal. No substantial pericardial effusion. Coronary artery calcification is evident. Moderate atherosclerotic calcification is noted in the wall of the thoracic aorta. Mediastinum/Nodes: Scattered mediastinal lymph nodes are stable including 8 mm short axis right paratracheal node on 21/2. No evidence for gross hilar lymphadenopathy although assessment is limited by the lack of intravenous contrast on the current study. The esophagus has normal imaging features. There is no axillary lymphadenopathy. Lungs/Pleura:  Centrilobular and paraseptal emphysema evident. Interval progression of consolidative opacity in the right parahilar lung since prior study, mainly reflection of right middle lobe volume loss and new consolidative opacity. Interval progression of right pleural effusion. No suspicious pulmonary nodule or mass in the left lung. No left pleural effusion. Upper Abdomen: Calcified gallstones again noted. Bilateral renal cysts evident. Musculoskeletal: No worrisome lytic or sclerotic osseous abnormality. IMPRESSION: 1. Interval progression of consolidative opacity in the right parahilar lung, mainly reflection of right middle lobe volume loss and new consolidative opacity. Interval progression of right pleural effusion. Imaging features raise concern for recurrent disease. Consider PET-CT to further evaluate. 2. Cholelithiasis. 3. Bilateral renal cysts. 4. Aortic Atherosclerosis (ICD10-I70.0) and Emphysema (ICD10-J43.9). Electronically Signed   By: Misty Stanley M.D.   On: 12/17/2021 10:20     ASSESSMENT & PLAN:  1. Squamous cell lung cancer, right ([place])   2. History of prostate cancer   3. Loss of weight    Cancer Staging  Squamous cell lung cancer, right ([place]) Staging form: Lung, AJCC 8th Edition - Clinical stage from 04/20/2019: Stage IIA (cT2b, cN0, cM0) - Signed by Earlie Server, MD on 06/02/2019   # cT2b N0 M0 squamous cell lung cancer. 12/16/2021, CT showed interval progression of consolidative opacity in the right perihilar lung, interval progression of right pleural effusion.  Cholelithiasis.  Bilateral renal cyst. Images reviewed and discussed with patient. Concern of recurrent lung cancer. I will obtain PET scan for further evaluation.  #Chronic kidney disease-avoid nephrotoxin. #Weight loss, due to poor nutrition.  Refer to nutritionist. History of prostate cancer.  Patient follows up with urologist.  11/25/2021 PSA is less than 0.1 Chronic gynecomastia.  Stable secondary to Casodex.    All  questions were answered. The patient knows to call the clinic with any problems questions or concerns.  cc [name], MD   Orders Placed This Encounter  Procedures   Ambulatory Referral to [facility] Nutrition  Referral Priority:   Routine    Referral Type:   Consultation    Referral Reason:   Specialty Services Required    Number of Visits Requested:   1    Return of visit:  To be determined.  Depending on PET scan findings.  Earlie Server, MD, PhD  12/18/2021

## 2021-12-18 NOTE — Progress Notes (Signed)
Patient here for follow up. Patient wanting to know what are the next steps.  ?

## 2021-12-20 ENCOUNTER — Ambulatory Visit (HOSPITAL_COMMUNITY)
Admission: RE | Admit: 2021-12-20 | Discharge: 2021-12-20 | Disposition: A | Discharge status: Home / Self Care | Payer: Medicare Other | Source: Ambulatory Visit | Attending: Oncology | Admitting: Oncology

## 2021-12-20 ENCOUNTER — Other Ambulatory Visit: Payer: Self-pay

## 2021-12-20 DIAGNOSIS — I2510 Atherosclerotic heart disease of native coronary artery without angina pectoris: Secondary | ICD-10-CM | POA: Insufficient documentation

## 2021-12-20 DIAGNOSIS — C3491 Malignant neoplasm of unspecified part of right bronchus or lung: Secondary | ICD-10-CM | POA: Insufficient documentation

## 2021-12-20 DIAGNOSIS — K8020 Calculus of gallbladder without cholecystitis without obstruction: Secondary | ICD-10-CM | POA: Diagnosis not present

## 2021-12-20 DIAGNOSIS — J439 Emphysema, unspecified: Secondary | ICD-10-CM | POA: Insufficient documentation

## 2021-12-20 DIAGNOSIS — N281 Cyst of kidney, acquired: Secondary | ICD-10-CM | POA: Diagnosis not present

## 2021-12-20 DIAGNOSIS — I700 Atherosclerosis of aorta: Secondary | ICD-10-CM | POA: Insufficient documentation

## 2021-12-20 LAB — GLUCOSE, CAPILLARY: Glucose-Capillary: 127 mg/dL — ABNORMAL HIGH (ref 70–99)

## 2021-12-20 MED ORDER — FLUDEOXYGLUCOSE F - 18 (FDG) INJECTION
9.0000 | Freq: Once | INTRAVENOUS | Status: AC | PRN
  Administered 2021-12-20: 9 via INTRAVENOUS

## 2021-12-24 ENCOUNTER — Telehealth: Payer: Self-pay | Admitting: *Deleted

## 2021-12-24 ENCOUNTER — Other Ambulatory Visit: Payer: Self-pay | Admitting: Oncology

## 2021-12-24 MED ORDER — AZITHROMYCIN 250 MG PO TABS: ORAL_TABLET | ORAL | 0 refills | Status: DC

## 2021-12-24 NOTE — Telephone Encounter (Signed)
Patient called to cancel 3/10 appointment. ?

## 2021-12-24 NOTE — Progress Notes (Signed)
Nutrition ? ?Notified that patient called to cancel nutrition appointment on 3/10.  Appointment cancelled and MD notified.   ? ?RD available if patient wants to reschedule.  ? ?Chloe Miyoshi B. Zenia Resides, RD, LDN ?Registered Dietitian ?[phone] (mobile) ? ?

## 2021-12-30 ENCOUNTER — Telehealth: Payer: Self-pay

## 2021-12-30 DIAGNOSIS — C3491 Malignant neoplasm of unspecified part of right bronchus or lung: Secondary | ICD-10-CM

## 2021-12-30 NOTE — Telephone Encounter (Signed)
Spoke to patient and he states that he has not completed antibiotic course, but so far, symptoms have improved. Informed him that he can call us to have [facility] evaluated him if he does not see further improvement after completing antibiotics, he verbalized understanding.  ? ?Also informed pt of follow up plan to have CT and MD follow up after CT in 3 months.  ?

## 2021-12-30 NOTE — Telephone Encounter (Signed)
12/30/2021 ?Appts made. Confirmed by pt over the phone. Copy of AVS also mailed  ?SRW  ?

## 2021-12-30 NOTE — Telephone Encounter (Signed)
-----   Message from Earlie Server, MD sent at 12/28/2021  1:57 PM EST ----- ?Please follow up on him to see if his congestion improve after finish the course of Azithromycin.  ?If not, offer [facility].  ?Follow up with CT chest Wo in 3 months and follow up with me.  ?

## 2022-04-01 ENCOUNTER — Ambulatory Visit
Admission: RE | Admit: 2022-04-01 | Discharge: 2022-04-01 | Disposition: A | Discharge status: Home / Self Care | Payer: Medicare Other | Source: Ambulatory Visit | Attending: Oncology | Admitting: Oncology

## 2022-04-01 DIAGNOSIS — C3491 Malignant neoplasm of unspecified part of right bronchus or lung: Secondary | ICD-10-CM | POA: Insufficient documentation

## 2022-04-02 ENCOUNTER — Other Ambulatory Visit: Payer: Self-pay

## 2022-04-02 DIAGNOSIS — C3491 Malignant neoplasm of unspecified part of right bronchus or lung: Secondary | ICD-10-CM

## 2022-04-03 ENCOUNTER — Inpatient Hospital Stay: Payer: Medicare Other | Admitting: Oncology

## 2022-04-03 ENCOUNTER — Encounter: Payer: Self-pay | Admitting: Oncology

## 2022-04-03 ENCOUNTER — Other Ambulatory Visit: Payer: Self-pay

## 2022-04-03 ENCOUNTER — Inpatient Hospital Stay: Payer: Medicare Other | Attending: Oncology

## 2022-04-03 VITALS — BP 139/72 | HR 86 | Temp 96.7°F | Resp 18 | Wt 191.8 lb

## 2022-04-03 DIAGNOSIS — C3491 Malignant neoplasm of unspecified part of right bronchus or lung: Secondary | ICD-10-CM | POA: Diagnosis not present

## 2022-04-03 DIAGNOSIS — Z85118 Personal history of other malignant neoplasm of bronchus and lung: Secondary | ICD-10-CM | POA: Diagnosis present

## 2022-04-03 DIAGNOSIS — Z79899 Other long term (current) drug therapy: Secondary | ICD-10-CM | POA: Diagnosis not present

## 2022-04-03 DIAGNOSIS — Z8546 Personal history of malignant neoplasm of prostate: Secondary | ICD-10-CM | POA: Diagnosis present

## 2022-04-03 DIAGNOSIS — N62 Hypertrophy of breast: Secondary | ICD-10-CM | POA: Insufficient documentation

## 2022-04-03 DIAGNOSIS — Z87891 Personal history of nicotine dependence: Secondary | ICD-10-CM | POA: Insufficient documentation

## 2022-04-03 DIAGNOSIS — I480 Paroxysmal atrial fibrillation: Secondary | ICD-10-CM | POA: Insufficient documentation

## 2022-04-03 DIAGNOSIS — T50905A Adverse effect of unspecified drugs, medicaments and biological substances, initial encounter: Secondary | ICD-10-CM | POA: Insufficient documentation

## 2022-04-03 DIAGNOSIS — Z7901 Long term (current) use of anticoagulants: Secondary | ICD-10-CM | POA: Diagnosis not present

## 2022-04-03 DIAGNOSIS — Z923 Personal history of irradiation: Secondary | ICD-10-CM | POA: Insufficient documentation

## 2022-04-03 LAB — COMPREHENSIVE METABOLIC PANEL
ALT: 10 U/L (ref 0–44)
AST: 18 U/L (ref 15–41)
Albumin: 3.9 g/dL (ref 3.5–5.0)
Alkaline Phosphatase: 37 U/L — ABNORMAL LOW (ref 38–126)
Anion gap: 9 (ref 5–15)
BUN: 28 mg/dL — ABNORMAL HIGH (ref 8–23)
CO2: 25 mmol/L (ref 22–32)
Calcium: 9.1 mg/dL (ref 8.9–10.3)
Chloride: 104 mmol/L (ref 98–111)
Creatinine, Ser: 1.56 mg/dL — ABNORMAL HIGH (ref 0.61–1.24)
GFR, Estimated: 44 mL/min — ABNORMAL LOW (ref >60)
Glucose, Bld: 120 mg/dL — ABNORMAL HIGH (ref 70–99)
Potassium: 5.2 mmol/L — ABNORMAL HIGH (ref 3.5–5.1)
Sodium: 138 mmol/L (ref 135–145)
Total Bilirubin: 0.9 mg/dL (ref 0.3–1.2)
Total Protein: 7.8 g/dL (ref 6.5–8.1)

## 2022-04-03 LAB — CBC WITH DIFFERENTIAL/PLATELET
Abs Immature Granulocytes: 0.02 10*3/uL (ref 0.00–0.07)
Basophils Absolute: 0 10*3/uL (ref 0.0–0.1)
Basophils Relative: 0 %
Eosinophils Absolute: 0 10*3/uL (ref 0.0–0.5)
Eosinophils Relative: 1 %
HCT: 40.9 % (ref 39.0–52.0)
Hemoglobin: 13.2 g/dL (ref 13.0–17.0)
Immature Granulocytes: 0 %
Lymphocytes Relative: 24 %
Lymphs Abs: 1.4 10*3/uL (ref 0.7–4.0)
MCH: 31.2 pg (ref 26.0–34.0)
MCHC: 32.3 g/dL (ref 30.0–36.0)
MCV: 96.7 fL (ref 80.0–100.0)
Monocytes Absolute: 0.6 10*3/uL (ref 0.1–1.0)
Monocytes Relative: 10 %
Neutro Abs: 3.8 10*3/uL (ref 1.7–7.7)
Neutrophils Relative %: 65 %
Platelets: 198 10*3/uL (ref 150–400)
RBC: 4.23 MIL/uL (ref 4.22–5.81)
RDW: 14.8 % (ref 11.5–15.5)
WBC: 6 10*3/uL (ref 4.0–10.5)
nRBC: 0 % (ref 0.0–0.2)

## 2022-04-03 NOTE — Progress Notes (Signed)
Patient here for follow up. Pt reports he has been having pain in his chest for the past 2 years. Pt reports that he has not been taking Eliquis because he cannot afford. He has been off of medication for about 3-4 months.

## 2022-04-03 NOTE — Assessment & Plan Note (Signed)
Stable secondary to Casodex.

## 2022-04-03 NOTE — Assessment & Plan Note (Signed)
He self stopped Eliquis.  Recommend patient to follow up with cardiology

## 2022-04-03 NOTE — Assessment & Plan Note (Signed)
cT2b N0 M0 Stage IIA squamous cell lung cancer. CT Images were reviewed and discussed with patient.  No recurrence.  Continue CT surveillance.

## 2022-04-03 NOTE — Assessment & Plan Note (Signed)
Patient follows up with urologist.  11/25/2021 PSA is less than 0.1

## 2022-04-03 NOTE — Progress Notes (Signed)
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 CT Images were reviewed and discussed with patient.  No recurrence.  Continue CT surveillance.    History of prostate cancer  Patient follows up with urologist.  11/25/2021 PSA is less than 0.1  Drug-induced gynecomastia  Stable secondary to Casodex.    PAF (paroxysmal atrial fibrillation) ([place]) He self stopped Eliquis.  Recommend patient to follow up with cardiology  Orders Placed This Encounter  Procedures   CT Chest Wo Contrast    Standing Status:   Future    Standing Expiration Date:   04/03/2023    Order Specific Question:   Preferred imaging location?    Answer:   [facility]   CBC with Differential/Platelet    Standing Status:   Future    Standing Expiration Date:   04/04/2023   Comprehensive metabolic panel    Standing Status:   Future    Standing Expiration Date:   04/03/2023   Follow up in 6 months.  All questions were answered. The patient knows to call the clinic with any problems, questions or concerns.  Earlie Server, MD, PhD [facility] Hematology Oncology 04/03/2022   REASON FOR VISIT:  Follow-up for squamous cell carcinoma  HISTORY OF PRESENTING ILLNESS:   Carlos Levine is a  81 y.o.  male with PMH listed below was seen in consultation at the request of  [name], MD  for evaluation of lung mass.  Oncology History  Squamous cell lung cancer, right ([place])  04/13/2019 Initial Diagnosis   Squamous cell lung cancer, right   -04/13/19 CT guided lung mass biopsy showed squamous cell lung cancer. Given patient's age, multiple comorbidities,  EBUS assisted lymph node biopsy may not be beneficial     04/20/2019 Cancer Staging   Staging  form: Lung, AJCC 8th Edition - Clinical stage from 04/20/2019: Stage IIA (cT2b, cN0, cM0) - Signed by Earlie Server, MD on 06/02/2019   10/2019 -  Radiation Therapy   SBRT of right lobe mass   12/05/2020 Imaging   CT schedulers stable appearance of chronic postradiation masslike fibrosis in the right lung.  There is small right pleural effusion.  2 new groundglass attenuation nodules in the left upper lobe, up to 1.7 x 1.4 cm.  Nonspecific.  COPD/emphysema    06/10/2021 Imaging   CT showed a stable treatment changes of the right chest.  Resolution of groundglass nodules that were seen previously.  Likely postinfectious or inflammatory.  Stable small right pleural effusion.  Three-vessel coronary artery disease.  Cholelithiasis.  Emphysema and aortic atherosclerosis. Labs were reviewed and discussed with patient   06/10/2021 Imaging    CT showed a stable treatment changes of the right chest.  Resolution of groundglass nodules that were seen previously.  Likely postinfectious or inflammatory.  Stable small right pleural effusion.  Three-vessel coronary artery disease.  Cholelithiasis.  Emphysema and aortic atherosclerosis.   12/16/2021 Imaging   CT showed interval progression of consolidative opacity in the right perihilar lung, interval progression of right pleural effusion.  Cholelithiasis.  Bilateral renal cyst   12/20/2021 Imaging   PET showed  1. No suspicious hypermetabolic activity within the medial right lung to suggest local recurrence. Low level  activity appears nonfocal and is compatible with post treatment change. Continued CT follow-up recommended. 2. No evidence of metastatic disease or new suspicious hypermetabolic activity. 3. Enlarging probable sebaceous cyst superior to the right shoulder and within the mid right back. 4. Stable additional incidental findings including cholelithiasis,renal cysts, Coronary and aortic atherosclerosis (ICD10-I70.0).Emphysema (ICD10-J43.9).   04/01/2022  Imaging   CT chest wo contrast showed 1. Stable post radiation fibrosis and volume loss in the right hemithorax. No evidence of local recurrence or metastatic disease.2. Small loculated right pleural effusion has mildly enlarged compared with previous CT. No associated pleural nodularity.3. Coronary and aortic atherosclerosis (ICD10-I70.0). Emphysema     Remote history of prostate cancer, previously follows up with Dr. [name].  Patient is on Casodex 50 mg daily. Was last seen by Dr. [name] in 2018., Malignant neoplasm of prostate ([facility]) 02/2006  Gleason's 8 in 4 of 8 biopsy specimens all from the right side. PSA 12.2  PSA 01/23/2017 is <0.01     INTERVAL HISTORY Carlos Levine is a 81 y.o. male who has above history reviewed by me today presents for follow up visit for management of squamous cell lung cancer Patient was previously on Eliquis 2.5mg  bid for paroxysmal atrial fibrillation. He self stopped Eliquis + chronic cough.  He has gained 6 pounds.   Review of Systems  Constitutional:  Negative for appetite change, chills, fatigue, fever and unexpected weight change.  HENT:   Negative for hearing loss and voice change.   Eyes:  Negative for eye problems and icterus.  Respiratory:  Positive for shortness of breath. Negative for chest tightness and cough.   Cardiovascular:  Negative for leg swelling.       Chest wall pain  Gastrointestinal:  Negative for abdominal distention and abdominal pain.  Endocrine: Negative for hot flashes.  Genitourinary:  Negative for difficulty urinating, dysuria and frequency.   Musculoskeletal:  Positive for back pain. Negative for arthralgias.  Skin:  Negative for itching and rash.  Neurological:  Negative for light-headedness and numbness.  Hematological:  Negative for adenopathy. Does not bruise/bleed easily.  Psychiatric/Behavioral:  Negative for confusion.     MEDICAL HISTORY:  Past Medical History:  Diagnosis Date   Arthritis    Cancer [facility])     prostate   Chronic kidney disease    Coronary artery disease    Diabetes mellitus without complication (HCC)    Dyspnea    with minimal exertion   Dysrhythmia    history of SVT   History of kidney stones    History of radiation therapy    lung   Hyperlipidemia    Hypertension    Peripheral vascular disease ([place])    Prostate cancer ([place])    Squamous cell lung cancer ([place]) 04/2020   also has history of skin cancer    SURGICAL HISTORY: Past Surgical History:  Procedure Laterality Date   AMPUTATION FINGER / THUMB Right    BACK SURGERY  1975   removed 2 discs and fused others   PACEMAKER LEADLESS INSERTION N/A 07/25/2020   Procedure: PACEMAKER LEADLESS INSERTION;  Surgeon: Isaias Cowman, MD;  Location: [place] [facility];  Service: Cardiovascular;  Laterality: N/A;   PROSTATE CRYOABLATION     SPINAL FUSION     STENT PLACEMENT ILIAC ([place] HX) Left 2014   Left common iliac and left external iliac by Dr. Delana Meyer   VIDEO BRONCHOSCOPY WITH ENDOBRONCHIAL NAVIGATION N/A 05/30/2019   Procedure: VIDEO BRONCHOSCOPY WITH ENDOBRONCHIAL NAVIGATION, DIABETIC;  Surgeon:  Ottie Glazier, MD;  Location: [facility];  Service: Thoracic;  Laterality: N/A;   VIDEO BRONCHOSCOPY WITH ENDOBRONCHIAL NAVIGATION N/A 05/28/2020   Procedure: VIDEO BRONCHOSCOPY WITH ENDOBRONCHIAL NAVIGATION;  Surgeon: Ottie Glazier, MD;  Location: [facility];  Service: Thoracic;  Laterality: N/A;   VIDEO BRONCHOSCOPY WITH ENDOBRONCHIAL ULTRASOUND N/A 05/30/2019   Procedure: VIDEO BRONCHOSCOPY WITH ENDOBRONCHIAL ULTRASOUND, DIABETIC;  Surgeon: Ottie Glazier, MD;  Location: [facility];  Service: Thoracic;  Laterality: N/A;   VIDEO BRONCHOSCOPY WITH ENDOBRONCHIAL ULTRASOUND N/A 05/28/2020   Procedure: VIDEO BRONCHOSCOPY WITH ENDOBRONCHIAL ULTRASOUND;  Surgeon: Ottie Glazier, MD;  Location: [facility];  Service: Thoracic;  Laterality: N/A;    SOCIAL HISTORY: Social History   Socioeconomic History   Marital status: Married     Spouse name: [name]   Number of children: 4   Years of education: Not on file   Highest education level: Not on file  Occupational History   Occupation: Games developer    Comment: retired  Tobacco Use   Smoking status: Former    Years: 30.00    Types: Cigarettes    Quit date: 10/20/2009    Years since quitting: 12.4   Smokeless tobacco: Never  Vaping Use   Vaping Use: Never used  Substance and Sexual Activity   Alcohol use: No   Drug use: No   Sexual activity: Yes    Birth control/protection: None  Other Topics Concern   Not on file  Social History Narrative   Patient lives with wife and occasionally daughter and granddaughter.   Social Determinants of Health   Financial Resource Strain: Low Risk  (04/13/2019)   Overall Financial Resource Strain (CARDIA)    Difficulty of Paying Living Expenses: Not hard at all  Food Insecurity: No Food Insecurity (04/13/2019)   Hunger Vital Sign    Worried About Running Out of Food in the Last Year: Never true    Ran Out of Food in the Last Year: Never true  Transportation Needs: No Transportation Needs (04/13/2019)   PRAPARE - Hydrologist (Medical): No    Lack of Transportation (Non-Medical): No  Physical Activity: Unknown (04/13/2019)   Exercise Vital Sign    Days of Exercise per Week: 0 days    Minutes of Exercise per Session: Not on file  Stress: Stress Concern Present (04/13/2019)   [place]    Feeling of Stress : To some extent  Social Connections: Moderately Isolated (04/13/2019)   Social Connection and Isolation Panel [NHANES]    Frequency of Communication with Friends and Family: More than three times a week    Frequency of Social Gatherings with Friends and Family: Not on file    Attends Religious Services: Never    Marine scientist or Organizations: No    Attends Archivist Meetings: Never    Marital Status: Married   Human resources officer Violence: Not At Risk (04/13/2019)   Humiliation, Afraid, Rape, and Kick questionnaire    Fear of Current or Ex-Partner: No    Emotionally Abused: No    Physically Abused: No    Sexually Abused: No    FAMILY HISTORY: Family History  Problem Relation Age of Onset   Lung cancer Father    Pancreatic cancer Father    Brain cancer Mother    Stomach cancer Paternal Aunt    Stomach cancer Paternal Uncle    Cancer Maternal Grandmother    Kidney cancer Maternal Grandfather  ALLERGIES:  is allergic to contrast media [iodinated contrast media] and ace inhibitors.  MEDICATIONS:  Current Outpatient Medications  Medication Sig Dispense Refill   bicalutamide (CASODEX) 50 MG tablet Take 1 tablet (50 mg total) by mouth daily at 12 noon. 90 tablet 3   Fluticasone-Umeclidin-Vilant (TRELEGY ELLIPTA) 100-62.5-25 MCG/INH AEPB Inhale into the lungs 2 (two) times daily.     ipratropium-albuterol (DUONEB) 0.5-2.5 (3) MG/3ML SOLN Take 3 mLs by nebulization 2 (two) times daily.     pioglitazone (ACTOS) 15 MG tablet Take 15 mg by mouth daily.     pravastatin (PRAVACHOL) 80 MG tablet Take 80 mg by mouth at bedtime.      apixaban (ELIQUIS) 2.5 MG TABS tablet Take 2.5 mg by mouth every 12 (twelve) hours.  (Patient not taking: Reported on 04/03/2022)     No current facility-administered medications for this visit.     PHYSICAL EXAMINATION: ECOG PERFORMANCE STATUS: 2 - Symptomatic, <50% confined to bed Vitals:   04/03/22 1346  BP: 139/72  Pulse: 86  Resp: 18  Temp: (!) 96.7 F (35.9 C)   Filed Weights   04/03/22 1346  Weight: 191 lb 12.8 oz (87 kg)    Physical Exam Constitutional:      General: He is not in acute distress.    Comments: Frail appearance  HENT:     Head: Normocephalic and atraumatic.  Eyes:     General: No scleral icterus.    Pupils: Pupils are equal, round, and reactive to light.  Cardiovascular:     Rate and Rhythm: Normal rate and regular rhythm.      Comments: Premature heartbeats Pulmonary:     Effort: Pulmonary effort is normal. No respiratory distress.     Breath sounds: No wheezing.     Comments: Decreased breath sound bilaterally. Abdominal:     General: Bowel sounds are normal. There is no distension.     Palpations: Abdomen is soft. There is no mass.     Tenderness: There is no abdominal tenderness.  Musculoskeletal:        General: No deformity. Normal range of motion.     Cervical back: Normal range of motion and neck supple.  Skin:    General: Skin is warm and dry.     Findings: No erythema or rash.  Neurological:     Mental Status: He is alert and oriented to person, place, and time.     Cranial Nerves: No cranial nerve deficit.     Coordination: Coordination normal.  Psychiatric:        Mood and Affect: Mood normal.        Thought Content: Thought content normal.   Bilateral gynecomastia  LABORATORY DATA:  I have reviewed the data as listed Lab Results  Component Value Date   WBC 6.0 04/03/2022   HGB 13.2 04/03/2022   HCT 40.9 04/03/2022   MCV 96.7 04/03/2022   PLT 198 04/03/2022   Recent Labs    06/12/21 1106 12/18/21 0948 04/03/22 1330  NA 135 137 138  K 4.4 4.7 5.2*  CL 105 106 104  CO2 21* 24 25  GLUCOSE 122* 110* 120*  BUN 29* 19 28*  CREATININE 1.77* 1.53* 1.56*  CALCIUM 8.7* 8.7* 9.1  GFRNONAA 38* 45* 44*  PROT 7.2 7.2 7.8  ALBUMIN 3.8 3.1* 3.9  AST 19 16 18   ALT 10 9 10   ALKPHOS 35* 38 37*  BILITOT 1.1 0.5 0.9    Iron/TIBC/Ferritin/ %Sat No results found for: "  IRON", "TIBC", "FERRITIN", "IRONPCTSAT"    RADIOGRAPHIC STUDIES: I have personally reviewed the radiological images as listed and agreed with the findings in the report.  CT Chest Wo Contrast  Result Date: 04/02/2022 CLINICAL DATA:  Lung cancer follow-up. Continued cough and shortness of breath. * Tracking Code: BO * EXAM: CT CHEST WITHOUT CONTRAST TECHNIQUE: Multidetector CT imaging of the chest was performed following  the standard protocol without IV contrast. RADIATION DOSE REDUCTION: This exam was performed according to the departmental dose-optimization program which includes automated exposure control, adjustment of the mA and/or kV according to patient size and/or use of iterative reconstruction technique. COMPARISON:  PET-CT 12/20/2021.  Chest CT 12/16/2021 and 06/12/2021. FINDINGS: Cardiovascular: Diffuse atherosclerosis of the aorta, great vessels and coronary arteries again noted. There are calcifications of the aortic valve and an implanted intracardiac pacemaker at the right ventricular apex. The heart size is normal. There is no pericardial effusion. Mediastinum/Nodes: There are no enlarged mediastinal, hilar or axillary lymph nodes.Hilar assessment is limited by the lack of intravenous contrast, although the hilar contours appear unchanged. The thyroid gland, trachea and esophagus demonstrate no significant findings. Lungs/Pleura: Partially loculated right pleural effusion has mildly increased in volume compared with the previous chest CT. Volume loss and perihilar fibrosis on the right are unchanged. There is no increasing mass effect to suggest local recurrence. Underlying mild to moderate centrilobular and paraseptal emphysema. No suspicious pulmonary nodules. Upper abdomen: The visualized upper abdomen appears stable without suspicious findings. There are stable gallstones and cystic renal lesions bilaterally; no follow-up imaging recommended. Musculoskeletal/Chest wall: There is no chest wall mass or suspicious osseous finding. Stable chronic L1 superior endplate compression deformity. IMPRESSION: 1. Stable post radiation fibrosis and volume loss in the right hemithorax. No evidence of local recurrence or metastatic disease. 2. Small loculated right pleural effusion has mildly enlarged compared with previous CT. No associated pleural nodularity. 3. Coronary and aortic atherosclerosis (ICD10-I70.0). Emphysema  (ICD10-J43.9). Electronically Signed   By: Richardean Sale M.D.   On: 04/02/2022 16:13

## 2022-06-29 ENCOUNTER — Emergency Department: Payer: Medicare Other

## 2022-06-29 ENCOUNTER — Emergency Department
Admission: EM | Admit: 2022-06-29 | Discharge: 2022-06-29 | Disposition: A | Discharge status: Home / Self Care | Payer: Medicare Other | Attending: Emergency Medicine | Admitting: Emergency Medicine

## 2022-06-29 ENCOUNTER — Other Ambulatory Visit: Payer: Self-pay

## 2022-06-29 ENCOUNTER — Encounter: Payer: Self-pay | Admitting: Emergency Medicine

## 2022-06-29 DIAGNOSIS — I10 Essential (primary) hypertension: Secondary | ICD-10-CM | POA: Diagnosis not present

## 2022-06-29 DIAGNOSIS — R42 Dizziness and giddiness: Secondary | ICD-10-CM | POA: Insufficient documentation

## 2022-06-29 DIAGNOSIS — R0609 Other forms of dyspnea: Secondary | ICD-10-CM | POA: Diagnosis not present

## 2022-06-29 DIAGNOSIS — Z85118 Personal history of other malignant neoplasm of bronchus and lung: Secondary | ICD-10-CM | POA: Insufficient documentation

## 2022-06-29 DIAGNOSIS — Z8546 Personal history of malignant neoplasm of prostate: Secondary | ICD-10-CM | POA: Insufficient documentation

## 2022-06-29 DIAGNOSIS — R0789 Other chest pain: Secondary | ICD-10-CM | POA: Insufficient documentation

## 2022-06-29 LAB — CBC WITH DIFFERENTIAL/PLATELET
Abs Immature Granulocytes: 0.01 10*3/uL (ref 0.00–0.07)
Basophils Absolute: 0 10*3/uL (ref 0.0–0.1)
Basophils Relative: 0 %
Eosinophils Absolute: 0 10*3/uL (ref 0.0–0.5)
Eosinophils Relative: 0 %
HCT: 37.1 % — ABNORMAL LOW (ref 39.0–52.0)
Hemoglobin: 11.8 g/dL — ABNORMAL LOW (ref 13.0–17.0)
Immature Granulocytes: 0 %
Lymphocytes Relative: 13 %
Lymphs Abs: 0.8 10*3/uL (ref 0.7–4.0)
MCH: 30.3 pg (ref 26.0–34.0)
MCHC: 31.8 g/dL (ref 30.0–36.0)
MCV: 95.4 fL (ref 80.0–100.0)
Monocytes Absolute: 0.6 10*3/uL (ref 0.1–1.0)
Monocytes Relative: 10 %
Neutro Abs: 4.6 10*3/uL (ref 1.7–7.7)
Neutrophils Relative %: 77 %
Platelets: 180 10*3/uL (ref 150–400)
RBC: 3.89 MIL/uL — ABNORMAL LOW (ref 4.22–5.81)
RDW: 14.8 % (ref 11.5–15.5)
WBC: 6.1 10*3/uL (ref 4.0–10.5)
nRBC: 0 % (ref 0.0–0.2)

## 2022-06-29 LAB — BASIC METABOLIC PANEL
Anion gap: 8 (ref 5–15)
BUN: 21 mg/dL (ref 8–23)
CO2: 25 mmol/L (ref 22–32)
Calcium: 8.8 mg/dL — ABNORMAL LOW (ref 8.9–10.3)
Chloride: 95 mmol/L — ABNORMAL LOW (ref 98–111)
Creatinine, Ser: 1.35 mg/dL — ABNORMAL HIGH (ref 0.61–1.24)
GFR, Estimated: 52 mL/min — ABNORMAL LOW (ref >60)
Glucose, Bld: 142 mg/dL — ABNORMAL HIGH (ref 70–99)
Potassium: 5.1 mmol/L (ref 3.5–5.1)
Sodium: 128 mmol/L — ABNORMAL LOW (ref 135–145)

## 2022-06-29 LAB — TROPONIN I (HIGH SENSITIVITY): Troponin I (High Sensitivity): 33 ng/L — ABNORMAL HIGH (ref <18)

## 2022-06-29 LAB — BRAIN NATRIURETIC PEPTIDE: B Natriuretic Peptide: 1199.8 pg/mL — ABNORMAL HIGH (ref 0.0–100.0)

## 2022-06-29 MED ORDER — MECLIZINE HCL 25 MG PO TABS
25.0000 mg | ORAL_TABLET | Freq: Once | ORAL | Status: AC
Start: 2022-06-29 — End: 2022-06-29
  Administered 2022-06-29: 25 mg via ORAL
  Filled 2022-06-29: qty 1

## 2022-06-29 MED ORDER — FUROSEMIDE 20 MG PO TABS: 20.0000 mg | ORAL_TABLET | Freq: Every day | ORAL | 0 refills | Status: DC

## 2022-06-29 MED ORDER — VALACYCLOVIR HCL 500 MG PO TABS
500.0000 mg | ORAL_TABLET | Freq: Once | ORAL | Status: AC
  Administered 2022-06-29: 500 mg via ORAL
  Filled 2022-06-29: qty 1

## 2022-06-29 NOTE — ED Triage Notes (Signed)
Dizziness x 1.5 years, but worse over pasts 5 days.  States dizziness occurs when standing up.  Also c/o chest tightness x 2 weeks.  AAOx3.  Skin warm and dry. NAD.  No SOB/ DOE

## 2022-06-29 NOTE — ED Provider Notes (Signed)
[facility] Provider Note   Event Date/Time   First MD Initiated Contact with Patient 06/29/22 1306     (approximate) History  Dizziness  HPI Carlos Levine is a 81 y.o. male with a stated past medical history of prostate cancer in remission after partial lobectomy of the right lung, hypertension, aortic stenosis, and paroxysmal atrial fibrillation who presents for lightheadedness upon standing has been worsening over the last week.  Patient states that the symptoms have been present over the last year however due to worsening symptoms over the last week he presented to the emergency department today.  Patient states that he feels as though the room is spinning around him whenever he gets up and tries to walk and does feel unsteady on his feet.  Patient denies any other exacerbating or relieving factors ROS: Patient currently denies any vision changes, tinnitus, difficulty speaking, facial droop, sore throat, chest pain, shortness of breath, abdominal pain, nausea/vomiting/diarrhea, dysuria, or weakness/numbness/paresthesias in any extremity   Physical Exam  Triage Vital Signs: ED Triage Vitals [06/29/22 1301]  Enc Vitals Group     BP 122/75     Pulse Rate 95     Resp 16     Temp      Temp Source Oral     SpO2 93 %     Weight 191 lb 12.8 oz (87 kg)     Height 5\' 8"  (1.727 m)     Head Circumference      Peak Flow      Pain Score 0     Pain Loc      Pain Edu?      Excl. in [place]?    Most recent vital signs: Vitals:   06/29/22 1315 06/29/22 1327  BP: 126/84   Pulse: 92   Resp: (!) 21   Temp:  (!) 97.5 F (36.4 C)  SpO2: 97%    General: Awake, oriented x4. CV:  Good peripheral perfusion.  Resp:  Normal effort.  Abd:  No distention.  Other:  Elderly the overweight Caucasian male laying in bed in no acute distress.  Patient is stable on his feet with a negative Romberg and no nystagmus on exam ED Results / Procedures / Treatments  Labs (all labs  ordered are listed, but only abnormal results are displayed) Labs Reviewed  BASIC METABOLIC PANEL - Abnormal; Notable for the following components:      Result Value   Sodium 128 (*)    Chloride 95 (*)    Glucose, Bld 142 (*)    Creatinine, Ser 1.35 (*)    Calcium 8.8 (*)    GFR, Estimated 52 (*)    All other components within normal limits  BRAIN NATRIURETIC PEPTIDE - Abnormal; Notable for the following components:   B Natriuretic Peptide 1,199.8 (*)    All other components within normal limits  CBC WITH DIFFERENTIAL/PLATELET - Abnormal; Notable for the following components:   RBC 3.89 (*)    Hemoglobin 11.8 (*)    HCT 37.1 (*)    All other components within normal limits  TROPONIN I (HIGH SENSITIVITY) - Abnormal; Notable for the following components:   Troponin I (High Sensitivity) 33 (*)    All other components within normal limits  TROPONIN I (HIGH SENSITIVITY)   EKG ED ECG REPORT I, Naaman Plummer, the attending physician, personally viewed and interpreted this ECG. Date: 06/29/2022 EKG Time: 1312 Rate: 90 Rhythm: Atrial fibrillation QRS Axis: normal Intervals: normal  ST/T Wave abnormalities: normal Narrative Interpretation: no evidence of acute ischemia RADIOLOGY ED MD interpretation: Single view portable chest x-ray interpreted by me and shows interval worsening of opacification of the right mid to lower lung which may be due to effusion -Agree with radiology assessment Official radiology report(s): DG Chest Portable 1 View  Result Date: 06/29/2022 CLINICAL DATA:  Dizziness and chest tightness 2 weeks. History of lung cancer. EXAM: PORTABLE CHEST 1 VIEW COMPARISON:  04/13/2019, CT 04/01/2022 FINDINGS: Post treatment volume loss of the right lung. Moderate opacification over the right mid to lower lung significantly worse compared to CT June 2023. Findings may be due to worsening associated effusion with atelectasis, although infection is possible. Left lung is clear.  Small metallic device projects over the heart in the midline unchanged. Remainder the exam is unchanged. IMPRESSION: Interval worsening opacification over the right mid to lower lung which may be due to worsening effusion with atelectasis, although infection is possible. Post treatment volume loss of the right lung. Consider follow-up CT chest with contrast for better evaluation in this patient with known history of right lung cancer. Electronically Signed   By: Marin Olp M.D.   On: 06/29/2022 14:17   PROCEDURES: Critical Care performed: No .1-3 Lead EKG Interpretation  Performed by: Naaman Plummer, MD Authorized by: Naaman Plummer, MD     Interpretation: abnormal     ECG rate:  91   ECG rate assessment: normal     Rhythm: atrial fibrillation     Ectopy: none     Conduction: normal    MEDICATIONS ORDERED IN ED: Medications  meclizine (ANTIVERT) tablet 25 mg (25 mg Oral Given 06/29/22 1420)  valACYclovir (VALTREX) tablet 500 mg (500 mg Oral Given 06/29/22 1421)   IMPRESSION / MDM / ASSESSMENT AND PLAN / ED COURSE  I reviewed the triage vital signs and the nursing notes.                             Differential diagnosis includes, but is not limited to, ACS, CHF, COPD, PE, aortic dissection, BPPV, CVA The patient is on the cardiac monitor to evaluate for evidence of arrhythmia and/or significant heart rate changes. Patient's presentation is most consistent with acute presentation with potential threat to life or bodily function. Based on History, Exam, and Findings, presentation not consistent with syncope, seizure, stroke, meningitis, symptomatic anemia (gastrointestinal bleed), Increased ICP (cerebral tumor/mass), ICH. Additionally, I have a low suspicion for AOM, labyrinthitis, or other infectious process.  Reassessment: Prior to discharge symptoms controlled, patient well appearing. Symptoms may be multifactorial including this right lung effusion for which we will try a course of  Lasix for 5 days prior to follow-up with his cardiologist.  The recent echo also showed moderate aortic stenosis which may be leading to patient's shortness of breath on exertion.  Patient also has paroxysmal atrial fibrillation and is currently in atrial fibrillation likely leading to decreased perfusion as well.  Patient expressed understanding and is in agreement with discharge today and follow-up with his cardiologist within the next 1-3 days Disposition:  Discharge. Strict return precautions discussed w/ full understanding. Advise follow up with primary care provider within 24-48 hours.   FINAL CLINICAL IMPRESSION(S) / ED DIAGNOSES   Final diagnoses:  Lightheadedness  Dyspnea on exertion   Rx / DC Orders   ED Discharge Orders          Ordered    furosemide (  LASIX) 20 MG tablet  Daily        06/29/22 1451           Note:  This document was prepared using Dragon voice recognition software and may include unintentional dictation errors.   Naaman Plummer, MD 06/29/22 1534

## 2022-08-11 ENCOUNTER — Other Ambulatory Visit: Payer: Self-pay | Admitting: Internal Medicine

## 2022-08-11 DIAGNOSIS — R42 Dizziness and giddiness: Secondary | ICD-10-CM

## 2022-08-11 DIAGNOSIS — W19XXXA Unspecified fall, initial encounter: Secondary | ICD-10-CM

## 2022-08-11 DIAGNOSIS — R600 Localized edema: Secondary | ICD-10-CM

## 2022-08-20 ENCOUNTER — Ambulatory Visit
Admission: RE | Admit: 2022-08-20 | Discharge: 2022-08-20 | Disposition: A | Discharge status: Home / Self Care | Payer: Medicare Other | Source: Ambulatory Visit | Attending: Internal Medicine | Admitting: Internal Medicine

## 2022-08-20 DIAGNOSIS — W19XXXA Unspecified fall, initial encounter: Secondary | ICD-10-CM | POA: Insufficient documentation

## 2022-08-20 DIAGNOSIS — Y92009 Unspecified place in unspecified non-institutional (private) residence as the place of occurrence of the external cause: Secondary | ICD-10-CM | POA: Insufficient documentation

## 2022-08-20 DIAGNOSIS — R600 Localized edema: Secondary | ICD-10-CM | POA: Diagnosis present

## 2022-09-26 ENCOUNTER — Ambulatory Visit: Admission: RE | Admit: 2022-09-26 | Payer: Medicare Other | Source: Ambulatory Visit

## 2022-10-01 ENCOUNTER — Inpatient Hospital Stay: Payer: Medicare Other

## 2022-10-01 ENCOUNTER — Encounter: Payer: Self-pay | Admitting: Emergency Medicine

## 2022-10-01 ENCOUNTER — Other Ambulatory Visit: Payer: Self-pay

## 2022-10-01 ENCOUNTER — Inpatient Hospital Stay
Admission: EM | Admit: 2022-10-01 | Discharge: 2022-10-10 | DRG: 291 | Disposition: A | Discharge status: Skilled Nursing Facility | Payer: Medicare Other | Attending: Internal Medicine | Admitting: Internal Medicine

## 2022-10-01 ENCOUNTER — Emergency Department: Payer: Medicare Other

## 2022-10-01 DIAGNOSIS — I495 Sick sinus syndrome: Secondary | ICD-10-CM | POA: Diagnosis present

## 2022-10-01 DIAGNOSIS — Z7901 Long term (current) use of anticoagulants: Secondary | ICD-10-CM | POA: Diagnosis not present

## 2022-10-01 DIAGNOSIS — E440 Moderate protein-calorie malnutrition: Secondary | ICD-10-CM | POA: Diagnosis present

## 2022-10-01 DIAGNOSIS — E7800 Pure hypercholesterolemia, unspecified: Secondary | ICD-10-CM | POA: Diagnosis present

## 2022-10-01 DIAGNOSIS — Z87891 Personal history of nicotine dependence: Secondary | ICD-10-CM

## 2022-10-01 DIAGNOSIS — S90821A Blister (nonthermal), right foot, initial encounter: Secondary | ICD-10-CM | POA: Diagnosis present

## 2022-10-01 DIAGNOSIS — Z91041 Radiographic dye allergy status: Secondary | ICD-10-CM

## 2022-10-01 DIAGNOSIS — X58XXXA Exposure to other specified factors, initial encounter: Secondary | ICD-10-CM | POA: Diagnosis present

## 2022-10-01 DIAGNOSIS — Z23 Encounter for immunization: Secondary | ICD-10-CM | POA: Diagnosis present

## 2022-10-01 DIAGNOSIS — I130 Hypertensive heart and chronic kidney disease with heart failure and stage 1 through stage 4 chronic kidney disease, or unspecified chronic kidney disease: Principal | ICD-10-CM | POA: Diagnosis present

## 2022-10-01 DIAGNOSIS — I080 Rheumatic disorders of both mitral and aortic valves: Secondary | ICD-10-CM | POA: Diagnosis present

## 2022-10-01 DIAGNOSIS — I2510 Atherosclerotic heart disease of native coronary artery without angina pectoris: Secondary | ICD-10-CM | POA: Diagnosis present

## 2022-10-01 DIAGNOSIS — E875 Hyperkalemia: Secondary | ICD-10-CM | POA: Diagnosis present

## 2022-10-01 DIAGNOSIS — L03116 Cellulitis of left lower limb: Secondary | ICD-10-CM | POA: Diagnosis present

## 2022-10-01 DIAGNOSIS — Z85118 Personal history of other malignant neoplasm of bronchus and lung: Secondary | ICD-10-CM

## 2022-10-01 DIAGNOSIS — E1122 Type 2 diabetes mellitus with diabetic chronic kidney disease: Secondary | ICD-10-CM | POA: Diagnosis present

## 2022-10-01 DIAGNOSIS — R531 Weakness: Secondary | ICD-10-CM

## 2022-10-01 DIAGNOSIS — I2489 Other forms of acute ischemic heart disease: Secondary | ICD-10-CM | POA: Diagnosis present

## 2022-10-01 DIAGNOSIS — R600 Localized edema: Principal | ICD-10-CM

## 2022-10-01 DIAGNOSIS — C3491 Malignant neoplasm of unspecified part of right bronchus or lung: Secondary | ICD-10-CM | POA: Diagnosis not present

## 2022-10-01 DIAGNOSIS — I5023 Acute on chronic systolic (congestive) heart failure: Secondary | ICD-10-CM | POA: Diagnosis present

## 2022-10-01 DIAGNOSIS — Z981 Arthrodesis status: Secondary | ICD-10-CM

## 2022-10-01 DIAGNOSIS — Z66 Do not resuscitate: Secondary | ICD-10-CM | POA: Diagnosis present

## 2022-10-01 DIAGNOSIS — R443 Hallucinations, unspecified: Secondary | ICD-10-CM | POA: Diagnosis not present

## 2022-10-01 DIAGNOSIS — Z923 Personal history of irradiation: Secondary | ICD-10-CM

## 2022-10-01 DIAGNOSIS — N1832 Chronic kidney disease, stage 3b: Secondary | ICD-10-CM | POA: Diagnosis present

## 2022-10-01 DIAGNOSIS — E1151 Type 2 diabetes mellitus with diabetic peripheral angiopathy without gangrene: Secondary | ICD-10-CM | POA: Diagnosis present

## 2022-10-01 DIAGNOSIS — Z85828 Personal history of other malignant neoplasm of skin: Secondary | ICD-10-CM | POA: Diagnosis not present

## 2022-10-01 DIAGNOSIS — Z79899 Other long term (current) drug therapy: Secondary | ICD-10-CM

## 2022-10-01 DIAGNOSIS — Z6829 Body mass index (BMI) 29.0-29.9, adult: Secondary | ICD-10-CM

## 2022-10-01 DIAGNOSIS — I480 Paroxysmal atrial fibrillation: Secondary | ICD-10-CM | POA: Diagnosis present

## 2022-10-01 DIAGNOSIS — I951 Orthostatic hypotension: Secondary | ICD-10-CM | POA: Diagnosis present

## 2022-10-01 DIAGNOSIS — I509 Heart failure, unspecified: Secondary | ICD-10-CM | POA: Diagnosis not present

## 2022-10-01 DIAGNOSIS — J449 Chronic obstructive pulmonary disease, unspecified: Secondary | ICD-10-CM | POA: Diagnosis present

## 2022-10-01 DIAGNOSIS — L03115 Cellulitis of right lower limb: Secondary | ICD-10-CM | POA: Diagnosis not present

## 2022-10-01 DIAGNOSIS — K5900 Constipation, unspecified: Secondary | ICD-10-CM | POA: Diagnosis present

## 2022-10-01 DIAGNOSIS — E871 Hypo-osmolality and hyponatremia: Secondary | ICD-10-CM | POA: Diagnosis not present

## 2022-10-01 DIAGNOSIS — Z7984 Long term (current) use of oral hypoglycemic drugs: Secondary | ICD-10-CM

## 2022-10-01 DIAGNOSIS — E11628 Type 2 diabetes mellitus with other skin complications: Secondary | ICD-10-CM | POA: Diagnosis present

## 2022-10-01 DIAGNOSIS — R7989 Other specified abnormal findings of blood chemistry: Secondary | ICD-10-CM | POA: Diagnosis present

## 2022-10-01 DIAGNOSIS — Z7189 Other specified counseling: Secondary | ICD-10-CM | POA: Diagnosis not present

## 2022-10-01 DIAGNOSIS — Z8546 Personal history of malignant neoplasm of prostate: Secondary | ICD-10-CM

## 2022-10-01 DIAGNOSIS — Z515 Encounter for palliative care: Secondary | ICD-10-CM

## 2022-10-01 DIAGNOSIS — S90822A Blister (nonthermal), left foot, initial encounter: Secondary | ICD-10-CM | POA: Diagnosis present

## 2022-10-01 DIAGNOSIS — I10 Essential (primary) hypertension: Secondary | ICD-10-CM | POA: Diagnosis present

## 2022-10-01 DIAGNOSIS — N529 Male erectile dysfunction, unspecified: Secondary | ICD-10-CM | POA: Diagnosis present

## 2022-10-01 DIAGNOSIS — Z888 Allergy status to other drugs, medicaments and biological substances status: Secondary | ICD-10-CM

## 2022-10-01 LAB — COMPREHENSIVE METABOLIC PANEL
ALT: 10 U/L (ref 0–44)
AST: 31 U/L (ref 15–41)
Albumin: 3.7 g/dL (ref 3.5–5.0)
Alkaline Phosphatase: 39 U/L (ref 38–126)
Anion gap: 12 (ref 5–15)
BUN: 34 mg/dL — ABNORMAL HIGH (ref 8–23)
CO2: 26 mmol/L (ref 22–32)
Calcium: 9.2 mg/dL (ref 8.9–10.3)
Chloride: 92 mmol/L — ABNORMAL LOW (ref 98–111)
Creatinine, Ser: 1.56 mg/dL — ABNORMAL HIGH (ref 0.61–1.24)
GFR, Estimated: 44 mL/min — ABNORMAL LOW (ref >60)
Glucose, Bld: 105 mg/dL — ABNORMAL HIGH (ref 70–99)
Potassium: 5.7 mmol/L — ABNORMAL HIGH (ref 3.5–5.1)
Sodium: 130 mmol/L — ABNORMAL LOW (ref 135–145)
Total Bilirubin: 2.3 mg/dL — ABNORMAL HIGH (ref 0.3–1.2)
Total Protein: 7.3 g/dL (ref 6.5–8.1)

## 2022-10-01 LAB — CBC WITH DIFFERENTIAL/PLATELET
Abs Immature Granulocytes: 0.02 10*3/uL (ref 0.00–0.07)
Basophils Absolute: 0 10*3/uL (ref 0.0–0.1)
Basophils Relative: 0 %
Eosinophils Absolute: 0 10*3/uL (ref 0.0–0.5)
Eosinophils Relative: 0 %
HCT: 39 % (ref 39.0–52.0)
Hemoglobin: 12.8 g/dL — ABNORMAL LOW (ref 13.0–17.0)
Immature Granulocytes: 0 %
Lymphocytes Relative: 15 %
Lymphs Abs: 1 10*3/uL (ref 0.7–4.0)
MCH: 30.9 pg (ref 26.0–34.0)
MCHC: 32.8 g/dL (ref 30.0–36.0)
MCV: 94.2 fL (ref 80.0–100.0)
Monocytes Absolute: 0.8 10*3/uL (ref 0.1–1.0)
Monocytes Relative: 12 %
Neutro Abs: 4.5 10*3/uL (ref 1.7–7.7)
Neutrophils Relative %: 73 %
Platelets: 172 10*3/uL (ref 150–400)
RBC: 4.14 MIL/uL — ABNORMAL LOW (ref 4.22–5.81)
RDW: 14.6 % (ref 11.5–15.5)
WBC: 6.3 10*3/uL (ref 4.0–10.5)
nRBC: 0 % (ref 0.0–0.2)

## 2022-10-01 LAB — CBG MONITORING, ED
Glucose-Capillary: 99 mg/dL (ref 70–99)
Glucose-Capillary: 120 mg/dL — ABNORMAL HIGH (ref 70–99)

## 2022-10-01 LAB — MAGNESIUM: Magnesium: 2.2 mg/dL (ref 1.7–2.4)

## 2022-10-01 LAB — BRAIN NATRIURETIC PEPTIDE: B Natriuretic Peptide: 1339.2 pg/mL — ABNORMAL HIGH (ref 0.0–100.0)

## 2022-10-01 LAB — TROPONIN I (HIGH SENSITIVITY)
Troponin I (High Sensitivity): 52 ng/L — ABNORMAL HIGH (ref <18)
Troponin I (High Sensitivity): 48 ng/L — ABNORMAL HIGH (ref <18)

## 2022-10-01 MED ORDER — IPRATROPIUM-ALBUTEROL 0.5-2.5 (3) MG/3ML IN SOLN: 3.0000 mL | Freq: Two times a day (BID) | RESPIRATORY_TRACT | Status: DC | PRN

## 2022-10-01 MED ORDER — SODIUM CHLORIDE 0.9 % IV SOLN
1.0000 g | Freq: Once | INTRAVENOUS | Status: AC
  Administered 2022-10-01: 1 g via INTRAVENOUS
  Filled 2022-10-01: qty 10

## 2022-10-01 MED ORDER — FUROSEMIDE 10 MG/ML IJ SOLN
60.0000 mg | Freq: Once | INTRAMUSCULAR | Status: AC
  Administered 2022-10-01: 60 mg via INTRAVENOUS
  Filled 2022-10-01: qty 8

## 2022-10-01 MED ORDER — ENOXAPARIN SODIUM 40 MG/0.4ML IJ SOSY: 40.0000 mg | PREFILLED_SYRINGE | INTRAMUSCULAR | Status: DC

## 2022-10-01 MED ORDER — BICALUTAMIDE 50 MG PO TABS
50.0000 mg | ORAL_TABLET | Freq: Every day | ORAL | Status: DC
  Administered 2022-10-02 – 2022-10-10 (×9): 50 mg via ORAL
  Filled 2022-10-01 (×9): qty 1

## 2022-10-01 MED ORDER — INSULIN ASPART 100 UNIT/ML IJ SOLN
0.0000 [IU] | Freq: Three times a day (TID) | INTRAMUSCULAR | Status: DC
  Administered 2022-10-02: 3 [IU] via SUBCUTANEOUS
  Administered 2022-10-03: 2 [IU] via SUBCUTANEOUS
  Administered 2022-10-03: 8 [IU] via SUBCUTANEOUS
  Administered 2022-10-04 – 2022-10-07 (×7): 2 [IU] via SUBCUTANEOUS
  Administered 2022-10-09 – 2022-10-10 (×3): 3 [IU] via SUBCUTANEOUS
  Filled 2022-10-01 (×11): qty 1

## 2022-10-01 MED ORDER — INSULIN ASPART 100 UNIT/ML IJ SOLN
0.0000 [IU] | Freq: Every day | INTRAMUSCULAR | Status: DC
  Administered 2022-10-05: 2 [IU] via SUBCUTANEOUS
  Filled 2022-10-01: qty 1

## 2022-10-01 MED ORDER — PRAVASTATIN SODIUM 40 MG PO TABS
80.0000 mg | ORAL_TABLET | Freq: Every day | ORAL | Status: DC
  Administered 2022-10-01 – 2022-10-09 (×9): 80 mg via ORAL
  Filled 2022-10-01 (×2): qty 2
  Filled 2022-10-01: qty 4
  Filled 2022-10-01 (×6): qty 2

## 2022-10-01 MED ORDER — ONDANSETRON HCL 4 MG/2ML IJ SOLN: 4.0000 mg | Freq: Four times a day (QID) | INTRAMUSCULAR | Status: AC | PRN

## 2022-10-01 MED ORDER — ONDANSETRON HCL 4 MG PO TABS: 4.0000 mg | ORAL_TABLET | Freq: Four times a day (QID) | ORAL | Status: AC | PRN

## 2022-10-01 MED ORDER — SENNOSIDES-DOCUSATE SODIUM 8.6-50 MG PO TABS: 1.0000 | ORAL_TABLET | Freq: Every evening | ORAL | Status: DC | PRN

## 2022-10-01 MED ORDER — ACETAMINOPHEN 650 MG RE SUPP: 650.0000 mg | Freq: Four times a day (QID) | RECTAL | Status: AC | PRN

## 2022-10-01 MED ORDER — PREGABALIN 50 MG PO CAPS
50.0000 mg | ORAL_CAPSULE | Freq: Two times a day (BID) | ORAL | Status: DC
  Administered 2022-10-01 – 2022-10-10 (×18): 50 mg via ORAL
  Filled 2022-10-01 (×18): qty 1

## 2022-10-01 MED ORDER — LABETALOL HCL 5 MG/ML IV SOLN: 5.0000 mg | INTRAVENOUS | Status: DC | PRN

## 2022-10-01 MED ORDER — SODIUM CHLORIDE 0.9 % IV BOLUS
1000.0000 mL | Freq: Once | INTRAVENOUS | Status: AC
  Administered 2022-10-01: 1000 mL via INTRAVENOUS

## 2022-10-01 MED ORDER — BACITRACIN ZINC 500 UNIT/GM EX OINT
TOPICAL_OINTMENT | Freq: Two times a day (BID) | CUTANEOUS | Status: DC
  Administered 2022-10-03 – 2022-10-06 (×2): 1 via TOPICAL
  Filled 2022-10-01 (×21): qty 0.9

## 2022-10-01 MED ORDER — ACETAMINOPHEN 325 MG PO TABS
650.0000 mg | ORAL_TABLET | Freq: Four times a day (QID) | ORAL | Status: AC | PRN
  Administered 2022-10-02 – 2022-10-05 (×4): 650 mg via ORAL
  Filled 2022-10-01 (×4): qty 2

## 2022-10-01 MED ORDER — FUROSEMIDE 10 MG/ML IJ SOLN
40.0000 mg | Freq: Two times a day (BID) | INTRAMUSCULAR | Status: AC
  Administered 2022-10-02 (×2): 40 mg via INTRAVENOUS
  Filled 2022-10-01 (×2): qty 4

## 2022-10-01 MED ORDER — MORPHINE SULFATE (PF) 4 MG/ML IV SOLN
4.0000 mg | Freq: Once | INTRAVENOUS | Status: AC
  Administered 2022-10-01: 4 mg via INTRAVENOUS
  Filled 2022-10-01: qty 1

## 2022-10-01 MED ORDER — SODIUM CHLORIDE 0.9 % IV SOLN
1.0000 g | INTRAVENOUS | Status: DC
  Administered 2022-10-02 – 2022-10-03 (×2): 1 g via INTRAVENOUS
  Filled 2022-10-01: qty 10
  Filled 2022-10-01: qty 1
  Filled 2022-10-01: qty 10

## 2022-10-01 MED ORDER — APIXABAN 2.5 MG PO TABS
2.5000 mg | ORAL_TABLET | Freq: Two times a day (BID) | ORAL | Status: DC
  Administered 2022-10-01 – 2022-10-10 (×18): 2.5 mg via ORAL
  Filled 2022-10-01 (×18): qty 1

## 2022-10-01 NOTE — H&P (Addendum)
History and Physical   Carlos Levine YDX:[id number] DOB: Apr 09, 1941 DOA: 10/01/2022  PCP: [name], MD  Outpatient Specialists: Dr. Lanney Gins, pulmonologist; Dr. Nehemiah Massed cardiology Patient coming from: Home  I have personally briefly reviewed patient's old medical records in [place].  Chief Concern: Right leg swelling with skin laceration  HPI: Carlos Levine is a 81 year old male with history of hypertension, hyperlipidemia, non-insulin-dependent diabetes mellitus type 2, atrial fibrillation, PVD, CKD 3B, who presents emergency department for chief concerns of blistering of his lower extremity, left greater than the lower right.  Of note he is also had worsening swelling of his lower extremities.  He has been started on furosemide recently.  Initial vitals in the emergency department showed temperature of 97.4, respiration rate of 20, heart rate of 61, blood pressure 106/63, SpO2 of 97% on room air.  Serum sodium is 130, potassium 5.7, chloride 92, bicarb 26, BUN of 34, serum creatinine 1.56, nonfasting blood glucose 105, WBC 6.3, hemoglobin 12.8, platelets of 172.  Magnesium level is 2.2.  BNP was elevated at 1339.2.  High sensitive troponin was 52.  ED treatment: Furosemide 60 mg IV one-time dose, ceftriaxone 1 g IV one-time dose, sodium chloride 1 L bolus, morphine 4 mg IV. ---------------------- At bedside, he is able to tell me his name, age, current location, current calendar year.   He states that his lower extremities have been swelling for at least 1 month.  He endorses shortness of breath worse with exertion and this has been going on for at least 1 year.  He also endorses intermittent chest pain unrelated to exertion or rest.  He states that this chest discomfort has been ongoing for the past several years.,  This chest discomfort is unchanged.  He reports he had some skin scraping today that he noticed.  He denies any trauma, or bug bites that he knows  of.  Social history: He lives at home with his spouse, [name]. He is a former tobacco use, at the most 2 ppd. He quit 11 years ago. He denies etoh use. He denies recreational drug use. He is retired and formerly worked as a Games developer.   ROS: Constitutional: no weight change, no fever ENT/Mouth: no sore throat, no rhinorrhea Eyes: no eye pain, no vision changes Cardiovascular: no chest pain, + dyspnea,  + edema, no palpitations Respiratory: no cough, no sputum, no wheezing Gastrointestinal: no nausea, no vomiting, no diarrhea, no constipation Genitourinary: no urinary incontinence, no dysuria, no hematuria Musculoskeletal: no arthralgias, no myalgias Skin: no skin lesions, no pruritus, Neuro: + weakness, no loss of consciousness, no syncope Psych: no anxiety, no depression, + decrease appetite Heme/Lymph: no bruising, no bleeding  ED Course: Discussed with emergency medicine provider, patient requiring hospitalization for chief concerns of cellulitis.  Assessment/Plan  Principal Problem:   Acute exacerbation of CHF (congestive heart failure) (HCC) Active Problems:   PAF (paroxysmal atrial fibrillation) (HCC)   Elevated troponin   Benign essential hypertension   Stage 3b chronic kidney disease (CKD) (HCC)   Type 2 diabetes mellitus with diabetic chronic kidney disease ([place])   Pure hypercholesterolemia   ED (erectile dysfunction) of organic origin   Assessment and Plan: * Acute exacerbation of CHF (congestive heart failure) (HCC) - Recommend a.m. team to consult cardiology heart failure medication optimization - Complete echo was not ordered as patient had a complete echo on 09/08/2022 reading as severe LV systolic dysfunction, estimated at 20%, diffuse hypocontractile.  Normal right ventricular systolic  function.  Mild valvular regurgitation.  Severe valvular stenosis. - Strict I's and O's  PAF (paroxysmal atrial fibrillation) (HCC) - Eliquis 2.5 mg p.o. home medication  resumed  Pure hypercholesterolemia - Pravastatin 80 mg nightly resumed  Type 2 diabetes mellitus with diabetic chronic kidney disease (HCC) - Insulin SSI with agents coverage ordered  Stage 3b chronic kidney disease (CKD) ([place]) - At baseline  Benign essential hypertension - Labetalol 5 mg IV every 3 hours as needed for SBP greater than 180, 4 doses ordered  Elevated troponin - I suspect this is demand ischemia as there was no ischemic EKG changes - High since troponin on repeat downtrended  Suspected cellulitis of the left foot-continue with ceftriaxone 1 g IV daily  Chart reviewed.   DVT prophylaxis: Eliquis Code Status: Full code Diet: Heart healthy/carb modified Family Communication: Updated spouse, [name] over the phone Disposition Plan: Pending clinical course Consults called: None at this time Admission status: Telemetry cardiac, inpatient  Past Medical History:  Diagnosis Date   Arthritis    Cancer ([place])    prostate   Chronic kidney disease    Coronary artery disease    Diabetes mellitus without complication ([place])    Dyspnea    with minimal exertion   Dysrhythmia    history of SVT   History of kidney stones    History of radiation therapy    lung   Hyperlipidemia    Hypertension    Peripheral vascular disease ([place])    Prostate cancer ([place])    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 Delana Meyer   VIDEO BRONCHOSCOPY WITH ENDOBRONCHIAL NAVIGATION  N/A 05/30/2019   Procedure: VIDEO BRONCHOSCOPY WITH ENDOBRONCHIAL NAVIGATION, DIABETIC;  Surgeon: Ottie Glazier, MD;  Location: [facility];  Service: Thoracic;  Laterality: N/A;   VIDEO BRONCHOSCOPY WITH ENDOBRONCHIAL NAVIGATION N/A 05/28/2020   Procedure: VIDEO BRONCHOSCOPY WITH ENDOBRONCHIAL NAVIGATION;  Surgeon: Ottie Glazier, MD;  Location: [facility];  Service: Thoracic;  Laterality: N/A;   VIDEO BRONCHOSCOPY WITH ENDOBRONCHIAL ULTRASOUND N/A 05/30/2019   Procedure: VIDEO BRONCHOSCOPY WITH ENDOBRONCHIAL ULTRASOUND, DIABETIC;  Surgeon: Ottie Glazier, MD;  Location: [facility];  Service: Thoracic;  Laterality: N/A;   VIDEO BRONCHOSCOPY WITH ENDOBRONCHIAL ULTRASOUND N/A 05/28/2020   Procedure: VIDEO BRONCHOSCOPY WITH ENDOBRONCHIAL ULTRASOUND;  Surgeon: Ottie Glazier, MD;  Location: [facility];  Service: Thoracic;  Laterality: N/A;   Social History:  reports that he quit smoking about 12 years ago. His smoking use included cigarettes. He has never used smokeless tobacco. He reports that he does not drink alcohol and does not use drugs.  Allergies  Allergen Reactions   Contrast Media [Iodinated Contrast Media] Hives and Rash   Ace Inhibitors Other (See Comments)    Unknown reaction type   Family History  Problem Relation Age of Onset   Lung cancer Father    Pancreatic cancer Father    Brain cancer Mother    Stomach cancer  Paternal Aunt    Stomach cancer Paternal Uncle    Cancer Maternal Grandmother    Kidney cancer Maternal Grandfather    Family history: Family history reviewed and not pertinent  Prior to Admission medications   Medication Sig Start Date End Date Taking? Authorizing Provider  apixaban (ELIQUIS) 2.5 MG TABS tablet Take 2.5 mg by mouth every 12 (twelve) hours. 06/04/20 10/01/22 Yes [provider]  bicalutamide (CASODEX) 50 MG tablet Take 1 tablet (50 mg total) by mouth daily at 12 noon. 11/28/21  Yes Stoioff, Ronda Fairly, MD  furosemide (LASIX) 20 MG tablet Take 1 tablet  (20 mg total) by mouth daily for 7 days. 06/29/22 10/01/22 Yes Bradler, Vista Lawman, MD  ipratropium-albuterol (DUONEB) 0.5-2.5 (3) MG/3ML SOLN Take 3 mLs by nebulization 2 (two) times daily. 09/24/20  Yes [provider]  pioglitazone (ACTOS) 30 MG tablet Take 30 mg by mouth daily.   Yes [provider]  potassium chloride (KLOR-CON) 10 MEQ tablet Take 10 mEq by mouth daily as needed. Take 1 tablet (10 mEq total) by mouth once daily as needed (with torsemide) 08/11/22 08/11/23 Yes [provider]  pravastatin (PRAVACHOL) 80 MG tablet Take 80 mg by mouth at bedtime.  01/02/19  Yes [provider]  pregabalin (LYRICA) 50 MG capsule Take 50 mg by mouth 2 (two) times daily.   Yes [provider]  torsemide (DEMADEX) 20 MG tablet Take 20 mg by mouth once. 09/26/22 09/26/23 Yes [provider]  valACYclovir (VALTREX) 500 MG tablet Take 500 mg by mouth 3 (three) times daily as needed. 06/13/22  Yes [provider]  Fluticasone-Umeclidin-Vilant (TRELEGY ELLIPTA) 100-62.5-25 MCG/INH AEPB Inhale into the lungs 2 (two) times daily. Patient not taking: Reported on 10/01/2022 09/01/20   [provider]  pioglitazone (ACTOS) 15 MG tablet Take 15 mg by mouth daily. Patient not taking: Reported on 10/01/2022 08/29/19   [provider]   Physical Exam: Vitals:   10/01/22 1209 10/01/22 1213 10/01/22 1849  BP: 106/63  100/80  Pulse: 61  73  Resp: 20  18  Temp: (!) 97.4 F (36.3 C)    TempSrc: Oral    SpO2: 97%  99%  Weight:  83.9 kg   Height:  5\' 8"  (1.727 m)    Constitutional: appears age-appropriate, frail, NAD, calm, comfortable Eyes: PERRL, lids and conjunctivae normal ENMT: Mucous membranes are moist. Posterior pharynx clear of any exudate or lesions. Age-appropriate dentition. Hearing appropriate Neck: normal, supple, no masses, no thyromegaly Respiratory: clear to auscultation bilaterally, no wheezing, no crackles. Normal  respiratory effort. No accessory muscle use.  Cardiovascular: Regular rate and rhythm, no murmurs / rubs / gallops. No extremity edema. 2+ pedal pulses. No carotid bruits.  Abdomen: no tenderness, no masses palpated, no hepatosplenomegaly. Bowel sounds positive.  Musculoskeletal: no clubbing / cyanosis. No joint deformity upper and lower extremities. Good ROM, no contractures, no atrophy. Normal muscle tone.  Skin:  Neurologic: Sensation intact. Strength 5/5 in all 4.  Psychiatric: Normal judgment and insight. Alert and oriented x 3. Normal mood.   EKG: independently reviewed, showing   Chest x-ray on Admission: I personally reviewed and I agree with radiologist reading as below.  DG Chest 2 View  Result Date: 10/01/2022 CLINICAL DATA:  Leg swelling. EXAM: CHEST - 2 VIEW COMPARISON:  06/29/2022 FINDINGS: Stable significant post treatment changes involving the right hemithorax with loss of volume and changes of radiation fibrosis. Associated right pleural effusion. The left lung remains relatively clear.  No pulmonary lesions, overt pulmonary edema or left pleural effusion. IMPRESSION: 1. Stable significant post treatment changes involving the right hemithorax. 2. The left lung remains relatively clear. No left-sided pleural effusion. Electronically Signed   By: Marijo Sanes M.D.   On: 10/01/2022 11:58    Labs on Admission: I have personally reviewed following labs  CBC: Recent Labs  Lab 10/01/22 1215  WBC 6.3  NEUTROABS 4.5  HGB 12.8*  HCT 39.0  MCV 94.2  PLT 443   Basic Metabolic Panel: Recent Labs  Lab 10/01/22 1215 10/01/22 1413  NA 130*  --   K 5.7*  --   CL 92*  --   CO2 26  --   GLUCOSE 105*  --   BUN 34*  --   CREATININE 1.56*  --   CALCIUM 9.2  --   MG  --  2.2   GFR: Estimated Creatinine Clearance: 37.9 mL/min (A) (by C-G formula based on SCr of 1.56 mg/dL (H)).  Liver Function Tests: Recent Labs  Lab 10/01/22 1215  AST 31  ALT 10  ALKPHOS 39  BILITOT  2.3*  PROT 7.3  ALBUMIN 3.7   This document was prepared using Dragon Voice Recognition software and may include unintentional dictation errors.  Dr. Tobie Poet [facility]  If 7PM-7AM, please contact overnight-coverage provider If 7AM-7PM, please contact day coverage provider [email or web address]  10/01/2022, 7:16 PM

## 2022-10-01 NOTE — Assessment & Plan Note (Signed)
Currently with soft BP's on diuresis. - Labetalol IV PRN only for now

## 2022-10-01 NOTE — Assessment & Plan Note (Addendum)
Renal function at baseline, and in fact, by day of discharge, creatinine 1.16 GFR greater than 60, better than baseline.  Recommending rechecking a basic metabolic panel on 89/79 monitoring renal function. Monitor closely with diuresis.

## 2022-10-01 NOTE — ED Notes (Signed)
Called x1; family states pt getting xray completed.

## 2022-10-01 NOTE — ED Triage Notes (Signed)
Patient arrives in wheelchair by POV c/o bilateral leg swelling for a couple weeks. States it started when he was placed on fluid pills. Family also states patient has been having chest pain and shortness of breath. Patient states it is related to his lung cancer.

## 2022-10-01 NOTE — ED Provider Triage Note (Signed)
  Emergency Medicine Provider Triage Evaluation Note  Carlos Levine , a 81 y.o.male,  was evaluated in triage.  Pt complains of leg swelling.  Patient states that he has had persistent swelling in both of his lower extremities over the past several weeks.  He states that he noticed significant blistering on the top of both of his feet approximately 3 days ago.  He has been applying topical Neosporin, though this has not helped.  Endorses significant pain.   Review of Systems  Positive: Lower extremity swelling, blisters. Negative: Denies fever, chest pain, vomiting  Physical Exam  There were no vitals filed for this visit. Gen:   Awake, no distress   Resp:  Normal effort  MSK:   Moves extremities without difficulty  Other:  2+ pitting edema both lower extremities.  Medical Decision Making  Given the patient's initial medical screening exam, the following diagnostic evaluation has been ordered. The patient will be placed in the appropriate treatment space, once one is available, to complete the evaluation and treatment. I have discussed the plan of care with the patient and I have advised the patient that an ED physician or mid-level practitioner will reevaluate their condition after the test results have been received, as the results may give them additional insight into the type of treatment they may need.    Diagnostics: Labs, CXR, EKG  Treatments: none immediately   Teodoro Spray, [place] 10/01/22 1130

## 2022-10-01 NOTE — Hospital Course (Addendum)
Carlos Levine is a 81 year old male with history of hypertension, hyperlipidemia, non-insulin-dependent diabetes mellitus type 2, atrial fibrillation, PVD, CKD 3B, hx of prostate cancer, hx of lung cancer, who presented to ED on 10/01/2022 for evaluation of blistering of his lower extremity, left greater than the lower right, and increased lower extremity swelling despite being started on Lasix recently.  Evaluation in the ED was consistent with decompensated CHF with BNP elevated 1339.2 and exam with tense edema of lower extremities.  Admitted to hospitalist service, started on IV Lasix for diuresis and IV antibiotics for cellulitis associated with open wounds from fluid-filled blisters on L>R feet.  Hospital course complicated by orthostatic hypotension, now improving with midodrine.  Patient also having issues with hyponatremia, which improved with low-dose Lasix.  Patient continues to have poor p.o. intake remains very weak, felt to be a good candidate for skilled nursing.  Palliative care has been following along as well.

## 2022-10-01 NOTE — Assessment & Plan Note (Signed)
Due to demand ischemia in setting of acute CHF.  No ischemic EKG changes or anginal symptoms.

## 2022-10-01 NOTE — Assessment & Plan Note (Addendum)
Sliding scale Novolog. Hold Actos due to very poor p.o. intake.  Actos being discontinued on discharge and should CBGs start to trend upward, Actos can be resumed starting at 15 mg p.o. daily.

## 2022-10-01 NOTE — Assessment & Plan Note (Addendum)
Recent echo on 09/08/2022 - severe LV systolic dysfunction EF 20%, diffuse hypo-contractility.  Normal right ventricular systolic function.  Blood pressures have remained soft, limiting ability to diurese as well as with hyponatremia, see below.  Patient with mild valvular regurgitation & severe aortic and mitral stenosis.  Should have some mild improvement now that he is tolerating gentle p.o. twice daily Lasix

## 2022-10-01 NOTE — ED Provider Notes (Signed)
[facility] Provider Note    Event Date/Time   First MD Initiated Contact with Patient 10/01/22 1337     (approximate)   History   Chief Complaint Leg Swelling   HPI Carlos Levine is a 81 y.o. male, history of hypertension, severe aortic stenosis, CKD stage III, type 2 diabetes, paroxysmal atrial fibrillation, hyperlipidemia, peripheral vascular disease, presents to the emergency department for evaluation of leg swelling/blister.  Patient states that he has been having persistent leg swelling over the past month.  He is currently on furosemide, though it does not appear to be helping.  However, his predominant concern is the development of blisters along the dorsal aspects of his feet bilaterally, which he noticed for the first time 3 days ago.  He states that the blisters have gotten significantly worse and he has severe pain while ambulating.  Endorses significant drainage from the blisters/wounds as well.  In addition, he does endorse some chest pain and shortness of breath as well, though this been ongoing for several weeks and he attributes this to his COPD.  Denies fever/chills, abdominal pain, flank pain, nausea/vomiting, diarrhea, dysuria, headache, weakness, vision changes, or dizziness/lightheadedness.  Denies any recent falls or injuries.  He currently lives with his wife, though she is not able to take care of him.  History Limitations: No limitations.        Physical Exam  Triage Vital Signs: ED Triage Vitals  Enc Vitals Group     BP 10/01/22 1209 106/63     Pulse Rate 10/01/22 1209 61     Resp 10/01/22 1209 20     Temp 10/01/22 1209 (!) 97.4 F (36.3 C)     Temp Source 10/01/22 1209 Oral     SpO2 10/01/22 1209 97 %     Weight 10/01/22 1213 185 lb (83.9 kg)     Height 10/01/22 1213 5\' 8"  (1.727 m)     Head Circumference --      Peak Flow --      Pain Score 10/01/22 1210 6     Pain Loc --      Pain Edu? --      Excl. in [place]? --     Most  recent vital signs: Vitals:   10/01/22 1209 10/01/22 1849  BP: 106/63 100/80  Pulse: 61 73  Resp: 20 18  Temp: (!) 97.4 F (36.3 C)   SpO2: 97% 99%    General: Awake, appears uncomfortable. Skin: Warm, dry. No rashes or lesions.  Eyes: PERRL. Conjunctivae normal.  CV: Good peripheral perfusion.  Resp: Normal effort.  Lung sounds are clear bilaterally.  No rhonchi or rales. Abd: Soft, non-tender. No distention.  Neuro: At baseline. No gross neurological deficits.  Musculoskeletal: Normal ROM of all extremities.  Focused Exam: 2+ pitting edema in the lower extremities bilaterally.  Severe, circumferential unroofed blister along the dorsum of the left foot with other scattered dime sized blisters across both feet.  There does appear to be some surrounding erythema and warmth to the left foot as well extending up towards the ankle.  Sensation intact bilaterally.  Significant drainage noted from the blisters.  No purulent drainage noted.  See media file below.    Media Information      Physical Exam    ED Results / Procedures / Treatments  Labs (all labs ordered are listed, but only abnormal results are displayed) Labs Reviewed  CBC WITH DIFFERENTIAL/PLATELET - Abnormal; Notable for the following components:  Result Value   RBC 4.14 (*)    Hemoglobin 12.8 (*)    All other components within normal limits  BRAIN NATRIURETIC PEPTIDE - Abnormal; Notable for the following components:   B Natriuretic Peptide 1,339.2 (*)    All other components within normal limits  COMPREHENSIVE METABOLIC PANEL - Abnormal; Notable for the following components:   Sodium 130 (*)    Potassium 5.7 (*)    Chloride 92 (*)    Glucose, Bld 105 (*)    BUN 34 (*)    Creatinine, Ser 1.56 (*)    Total Bilirubin 2.3 (*)    GFR, Estimated 44 (*)    All other components within normal limits  TROPONIN I (HIGH SENSITIVITY) - Abnormal; Notable for the following components:   Troponin I (High  Sensitivity) 52 (*)    All other components within normal limits  TROPONIN I (HIGH SENSITIVITY) - Abnormal; Notable for the following components:   Troponin I (High Sensitivity) 48 (*)    All other components within normal limits  MAGNESIUM  BASIC METABOLIC PANEL  CBC  HEMOGLOBIN A1C  CBG MONITORING, ED     EKG Atrial flutter with variable AV block, rate of 61, intermittent PVCs present, no significant ST segment changes, normal QRS, no QT prolongation.    RADIOLOGY  ED Provider Interpretation: I personally reviewed and interpreted this x-ray, no evidence of any significant acute abnormalities.  DG Chest 2 View  Result Date: 10/01/2022 CLINICAL DATA:  Leg swelling. EXAM: CHEST - 2 VIEW COMPARISON:  06/29/2022 FINDINGS: Stable significant post treatment changes involving the right hemithorax with loss of volume and changes of radiation fibrosis. Associated right pleural effusion. The left lung remains relatively clear. No pulmonary lesions, overt pulmonary edema or left pleural effusion. IMPRESSION: 1. Stable significant post treatment changes involving the right hemithorax. 2. The left lung remains relatively clear. No left-sided pleural effusion. Electronically Signed   By: Marijo Sanes M.D.   On: 10/01/2022 11:58    PROCEDURES:  Critical Care performed: N/A.  Procedures    MEDICATIONS ORDERED IN ED: Medications  bacitracin ointment ( Topical Given 10/01/22 1418)  pravastatin (PRAVACHOL) tablet 80 mg (has no administration in time range)  acetaminophen (TYLENOL) tablet 650 mg (has no administration in time range)    Or  acetaminophen (TYLENOL) suppository 650 mg (has no administration in time range)  ondansetron (ZOFRAN) tablet 4 mg (has no administration in time range)    Or  ondansetron (ZOFRAN) injection 4 mg (has no administration in time range)  furosemide (LASIX) injection 40 mg (has no administration in time range)  enoxaparin (LOVENOX) injection 40 mg (has no  administration in time range)  senna-docusate (Senokot-S) tablet 1 tablet (has no administration in time range)  cefTRIAXone (ROCEPHIN) 1 g in sodium chloride 0.9 % 100 mL IVPB (has no administration in time range)  insulin aspart (novoLOG) injection 0-15 Units ( Subcutaneous Not Given 10/01/22 1705)  insulin aspart (novoLOG) injection 0-5 Units (has no administration in time range)  sodium chloride 0.9 % bolus 1,000 mL (0 mLs Intravenous Stopped 10/01/22 1600)  furosemide (LASIX) injection 60 mg (60 mg Intravenous Given 10/01/22 1417)  cefTRIAXone (ROCEPHIN) 1 g in sodium chloride 0.9 % 100 mL IVPB (0 g Intravenous Stopped 10/01/22 1448)  morphine (PF) 4 MG/ML injection 4 mg (4 mg Intravenous Given 10/01/22 1439)     IMPRESSION / MDM / ASSESSMENT AND PLAN / ED COURSE  I reviewed the triage vital signs and the nursing  notes.                              Differential diagnosis includes, but is not limited to, cellulitis, blisters, abscess, CHF, CKD, ACS,  ED Course Patient appears clinically stable, but uncomfortable.  Will treat pain with morphine.  CBC shows no cytosis.  Mild anemia present at 12.8.  BNP notably elevated at 1339.2, consistent with previous values.  CMP shows hyponatremia at 130 and hyperkalemia at 5.7.  Creatinine elevated 1.56.  Will initiate IV fluids  Initial troponin elevated at 52  Assessment/Plan Patient presents with bilateral lower extremity swelling with associated blisters.  Patient continues to have significant edema both lower extremities despite being on daily furosemide.  BNP elevated 1339, though patient does not have a formal diagnosis of heart failure at this time.  Previous echocardiogram in August shows severe aortic stenosis, but preserved LV function.  Treated here with IV furosemide to facilitate diuresis.  The blisters on his feet are quite significant and there does appear to be a cellulitic component to this as well.  Initiated ceftriaxone  here.  Given his comorbidities and the fact that he is unable to care for himself or have others provide consistent care, will plan to admit for wound care and continue diuresis.  Spoke with on-call hospitalist, Dr. Tobie Poet, who accepted admission.  Patient's presentation is most consistent with acute presentation with potential threat to life or bodily function.  Emergency department is very [place]      FINAL CLINICAL IMPRESSION(S) / ED DIAGNOSES   Final diagnoses:  Bilateral lower extremity edema     Rx / DC Orders   ED Discharge Orders     None        Note:  This document was prepared using Dragon voice recognition software and may include unintentional dictation errors.   Teodoro Spray, [place] 10/01/22 1904    Rada Hay, MD 10/02/22 1525

## 2022-10-01 NOTE — ED Notes (Signed)
Dressing to left foot changed at time of bacitracin applied

## 2022-10-01 NOTE — Assessment & Plan Note (Signed)
Continue statin. 

## 2022-10-01 NOTE — Assessment & Plan Note (Signed)
HR is controlled Appears not on any rate control medication Continue Eliquis Telemetry

## 2022-10-02 ENCOUNTER — Encounter: Payer: Self-pay | Admitting: Internal Medicine

## 2022-10-02 DIAGNOSIS — I5023 Acute on chronic systolic (congestive) heart failure: Secondary | ICD-10-CM | POA: Diagnosis not present

## 2022-10-02 DIAGNOSIS — N1832 Chronic kidney disease, stage 3b: Secondary | ICD-10-CM | POA: Diagnosis not present

## 2022-10-02 DIAGNOSIS — E11628 Type 2 diabetes mellitus with other skin complications: Secondary | ICD-10-CM | POA: Diagnosis present

## 2022-10-02 DIAGNOSIS — L03116 Cellulitis of left lower limb: Secondary | ICD-10-CM

## 2022-10-02 DIAGNOSIS — L03115 Cellulitis of right lower limb: Secondary | ICD-10-CM | POA: Diagnosis not present

## 2022-10-02 LAB — CBG MONITORING, ED
Glucose-Capillary: 121 mg/dL — ABNORMAL HIGH (ref 70–99)
Glucose-Capillary: 117 mg/dL — ABNORMAL HIGH (ref 70–99)
Glucose-Capillary: 168 mg/dL — ABNORMAL HIGH (ref 70–99)

## 2022-10-02 LAB — GLUCOSE, CAPILLARY
Glucose-Capillary: 102 mg/dL — ABNORMAL HIGH (ref 70–99)
Glucose-Capillary: 173 mg/dL — ABNORMAL HIGH (ref 70–99)

## 2022-10-02 LAB — HEMOGLOBIN A1C
Hgb A1c MFr Bld: 7.1 % — ABNORMAL HIGH (ref 4.8–5.6)
Mean Plasma Glucose: 157 mg/dL

## 2022-10-02 LAB — BASIC METABOLIC PANEL
Anion gap: 9 (ref 5–15)
BUN: 35 mg/dL — ABNORMAL HIGH (ref 8–23)
CO2: 26 mmol/L (ref 22–32)
Calcium: 8.7 mg/dL — ABNORMAL LOW (ref 8.9–10.3)
Chloride: 99 mmol/L (ref 98–111)
Creatinine, Ser: 1.48 mg/dL — ABNORMAL HIGH (ref 0.61–1.24)
GFR, Estimated: 47 mL/min — ABNORMAL LOW (ref >60)
Glucose, Bld: 126 mg/dL — ABNORMAL HIGH (ref 70–99)
Potassium: 4.3 mmol/L (ref 3.5–5.1)
Sodium: 134 mmol/L — ABNORMAL LOW (ref 135–145)

## 2022-10-02 LAB — CBC
HCT: 40.9 % (ref 39.0–52.0)
Hemoglobin: 13.2 g/dL (ref 13.0–17.0)
MCH: 30.8 pg (ref 26.0–34.0)
MCHC: 32.3 g/dL (ref 30.0–36.0)
MCV: 95.3 fL (ref 80.0–100.0)
Platelets: 153 10*3/uL (ref 150–400)
RBC: 4.29 MIL/uL (ref 4.22–5.81)
RDW: 14.6 % (ref 11.5–15.5)
WBC: 7.2 10*3/uL (ref 4.0–10.5)
nRBC: 0 % (ref 0.0–0.2)

## 2022-10-02 MED ORDER — POLYETHYLENE GLYCOL 3350 17 G PO PACK
17.0000 g | PACK | Freq: Every day | ORAL | Status: DC
  Administered 2022-10-02 – 2022-10-10 (×8): 17 g via ORAL
  Filled 2022-10-02 (×8): qty 1

## 2022-10-02 MED ORDER — INFLUENZA VAC A&B SA ADJ QUAD 0.5 ML IM PRSY
0.5000 mL | PREFILLED_SYRINGE | INTRAMUSCULAR | Status: AC
  Administered 2022-10-03: 0.5 mL via INTRAMUSCULAR
  Filled 2022-10-02: qty 0.5

## 2022-10-02 MED ORDER — SENNOSIDES-DOCUSATE SODIUM 8.6-50 MG PO TABS
1.0000 | ORAL_TABLET | Freq: Two times a day (BID) | ORAL | Status: DC
  Administered 2022-10-02 – 2022-10-10 (×14): 1 via ORAL
  Filled 2022-10-02 (×14): qty 1

## 2022-10-02 MED ORDER — VITAMIN C 500 MG PO TABS
500.0000 mg | ORAL_TABLET | Freq: Two times a day (BID) | ORAL | Status: DC
  Administered 2022-10-02 – 2022-10-10 (×16): 500 mg via ORAL
  Filled 2022-10-02 (×16): qty 1

## 2022-10-02 MED ORDER — ENSURE ENLIVE PO LIQD
237.0000 mL | Freq: Three times a day (TID) | ORAL | Status: DC
  Administered 2022-10-02 – 2022-10-10 (×14): 237 mL via ORAL

## 2022-10-02 MED ORDER — ZINC SULFATE 220 (50 ZN) MG PO CAPS
220.0000 mg | ORAL_CAPSULE | Freq: Every day | ORAL | Status: DC
  Administered 2022-10-03 – 2022-10-10 (×8): 220 mg via ORAL
  Filled 2022-10-02 (×8): qty 1

## 2022-10-02 MED ORDER — MELATONIN 5 MG PO TABS
5.0000 mg | ORAL_TABLET | Freq: Every evening | ORAL | Status: DC | PRN
  Administered 2022-10-06 – 2022-10-07 (×2): 5 mg via ORAL
  Filled 2022-10-02 (×2): qty 1

## 2022-10-02 MED ORDER — ADULT MULTIVITAMIN W/MINERALS CH
1.0000 | ORAL_TABLET | Freq: Every day | ORAL | Status: DC
  Administered 2022-10-03 – 2022-10-10 (×8): 1 via ORAL
  Filled 2022-10-02 (×8): qty 1

## 2022-10-02 NOTE — Progress Notes (Addendum)
Patient complaining about semi-private room. He has been tearful, stating if he can't have his own room he is going home. Patient states he has called his daughter to bring him clothes and pick him up. AC and charge RN aware.

## 2022-10-02 NOTE — ED Notes (Signed)
Assumed care from [place], [place]. Pt resting comfortably in bed at this time. Pt denies any current needs or questions. Call light with in reach.

## 2022-10-02 NOTE — ED Notes (Signed)
Delay in meal tray. Waiting on tray to arrive to give patient insulin.

## 2022-10-02 NOTE — TOC Initial Note (Signed)
Transition of Care [facility]) - Initial/Assessment Note    Patient Details  Name: Carlos Levine MRN: [id number] Date of Birth: 1941-02-07  [facility] CM/SW Contact:    Shelbie Hutching, RN Phone Number: 10/02/2022, 11:43 AM  Clinical Narrative:                 Patient admitted to the hospital with acute CHF exacerbation.  RNCM met with patient at the bedside introduced self and explained role in DC planning.  Patient is from home with spouse, he reports being mostly independent.  He says he should be walking with a walker but uses a cane, he reports several falls lately.  He does not drive but his wife does.  Patient's daughter and son live in [place] if he discharges home he does not want home health services, he says that his daughter could come over to help with dressings to his legs.  When asked about SNF he says he does not know.  Patient still endorses shortness of breath and chest pain with activity.   Patient is current with PCP Dr. Caryl Comes and he also reports that he has an OP cardiologist Dr. Nehemiah Massed.    Expected Discharge Plan: Home/Self Care Barriers to Discharge: Continued Medical Work up   Patient Goals and CMS Choice Patient states their goals for this hospitalization and ongoing recovery are:: concerned about the swelling in his legs and getting his feet taken care of      Expected Discharge Plan and Services Expected Discharge Plan: Home/Self Care   Discharge Planning Services: CM Consult   Living arrangements for the past 2 months: Mobile Home                           HH Arranged: Refused HH          Prior Living Arrangements/Services Living arrangements for the past 2 months: Mobile Home Lives with:: Spouse Patient language and need for interpreter reviewed:: Yes Do you feel safe going back to the place where you live?: Yes      Need for Family Participation in Patient Care: Yes (Comment) Care giver support system in place?: Yes  (comment) Current home services: DME (cane and walker) Criminal Activity/Legal Involvement Pertinent to Current Situation/Hospitalization: No - Comment as needed  Activities of Daily Living      Permission Sought/Granted Permission sought to share information with : Case Manager, Family Supports Permission granted to share information with : Yes, Verbal Permission Granted  Share Information with NAME: Clydell Sposito     Permission granted to share info w Relationship: spouse  Permission granted to share info w Contact Information: [phone]  Emotional Assessment Appearance:: Appears stated age Attitude/Demeanor/Rapport: Engaged Affect (typically observed): Accepting Orientation: : Oriented to Self, Oriented to Place, Oriented to  Time, Oriented to Situation Alcohol / Substance Use: Not Applicable Psych Involvement: No (comment)  Admission diagnosis:  Acute exacerbation of CHF (congestive heart failure) (HCC) [I50.9] Patient Active Problem List   Diagnosis Date Noted   Acute exacerbation of CHF (congestive heart failure) ([place]) 10/01/2022   Drug-induced gynecomastia 04/03/2022   PAF (paroxysmal atrial fibrillation) ([place]) 04/03/2022   Loss of weight 12/18/2021   History of prostate cancer 12/18/2021   Sick sinus syndrome ([place]) 07/25/2020   SOB (shortness of breath) 05/26/2019   SVT (supraventricular tachycardia) 05/26/2019   Squamous cell lung cancer, 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   Localized enlarged lymph nodes 03/29/2019   Goals  of care, counseling/discussion 03/29/2019   Chronic bilateral low back pain without sciatica 02/21/2018   Chronic constipation 11/19/2017   Hyperkalemia 11/19/2017   Elevated troponin 06/11/2017   Dizziness 06/24/2016   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   Sore of lower lip 05/13/2015   Benign essential hypertension 02/02/2015   Type 2 diabetes mellitus with diabetic chronic kidney disease ([place]) 02/02/2015   Pure  hypercholesterolemia 02/02/2015   Lumbar degenerative disc disease 02/02/2015   Stage 3b chronic kidney disease (CKD) ([place]) 05/12/2014   Malignant neoplasm of prostate ([place]) 07/14/2012   Hypertrophy of breast 07/14/2012   ED (erectile dysfunction) of organic origin 07/14/2012   PCP:  [name], MD Pharmacy:   [id number] [place] [place] [id number] [id number]: [phone]: [phone]    Social Determinants of Health (SDOH) Interventions    Readmission Risk Interventions     No data to display

## 2022-10-02 NOTE — Consult Note (Signed)
[place] Nurse Consult Note: Reason for Consult:ruptured serum filled blisters on feet, L>R Wound type:partial thickness. Photos provided to EMR by Provider. Pressure Injury POA:N/A Measurement:Not measured today. Bedside RN to measure with application of next dressing today Wound YTM:MITV, moist Drainage (amount, consistency, odor) serous, small to moderate Periwound:intact, edematous Dressing procedure/placement/frequency:I have provided Nursing with guidance for the topical care of the LE wounds using a NS cleanse, pat dry and application of xeroform (antimicrobial, nonadherent) gauze. This is to be topped with dry gauze and secured with Kerlix roll gauze applied from just below toes to just below knees. Feet are to be placed into Prevalon boots.A sacral prophylactic foam is to be placed for PI prevention.  [place] nursing team will not follow, but will remain available to this patient, the nursing and medical teams.  Please re-consult if needed.  Thank you for inviting [place] to participate in this patient's Plan of Care.  Maudie Flakes, MSN, RN, CNS, [place], Serita Grammes, [employer], [employer] phone:  [phone]

## 2022-10-02 NOTE — Assessment & Plan Note (Addendum)
K was 5.7 on admission.  On IV Lasix. At 4.9 on day of discharge

## 2022-10-02 NOTE — Progress Notes (Signed)
Initial Nutrition Assessment  DOCUMENTATION CODES:   Non-severe (moderate) malnutrition in context of chronic illness  INTERVENTION:   -Liberalize diet to 2 gram sodium -MVI with minerals daily -Ensure Enlive po TID, each supplement provides 350 kcal and 20 grams of protein -500 mg vitamin C BID -220 mg zinc sulfate daily x 14 days -Messaged MD regarding bowel regimen; pt reports last BM on 09/26/22  NUTRITION DIAGNOSIS:   Moderate Malnutrition related to chronic illness (CHF) as evidenced by mild fat depletion, moderate fat depletion, mild muscle depletion, moderate muscle depletion.  GOAL:   Patient will meet greater than or equal to 90% of their needs  MONITOR:   PO intake, Supplement acceptance  REASON FOR ASSESSMENT:   Consult Assessment of nutrition requirement/status  ASSESSMENT:   Pt with history of hypertension, hyperlipidemia, non-insulin-dependent diabetes mellitus type 2, atrial fibrillation, PVD, CKD 3B, who presents for chief concerns of blistering of his lower extremity, left greater than the lower right.  Pt admitted with CHF exacerbation.   Reviewed I/O's: -1.2 L x 24 hours  UOP: 2.3 L x 24 hours   Per [facility]OCN notes, pt with ruptured serum filled blisters on bilateral feet.   Case discussed with RN, who reports pt was just admitted to floor. Pt complains of pain in his feet, but only when touched.   Spoke with pt at bedside, who reports a general decline in health over the past 3 weeks. He shares that he recently went to his cardiologist and was prescribed fluid pills. Since then, his weight has fluctuated and has complained of edema in lower extremities. Diuretics have been changed multiple times with varying results. Per pt, he really started to feel poorly last week, when he noted blisters on his feet. Intake has been very poor and pt states "I can tell you that my intake all week was less than a kid sized [employer] hamburger". Pt consumed a few bites  of [place] and egg today. He shares he will often try and cook himself a meal, but once he sits down to eat it he no longer wants it. He also complains of constipation; his last BM was 09/26/22. Pt had been taking ex lax and colace intermittently. Sent message to MD to discuss possible bowel regimen.   Reviewed wt hx; pt has experienced a 3.6% wt loss over the past 3 months, which is not significant for time frame. Per pt, he estimates he has lost 16 pounds in the last 3 weeks since starting on diuretics.   Discussed importance of good meal and supplement intake to promote healing. Pt amenable to supplements. He reports he was drinking some Ensure PTA and liked it as long as it was cold.   Medications reviewed and include lasix.  Lab Results  Component Value Date   HGBA1C 7.4 (H) 05/26/2019   PTA DM medications are 30 mg actos daily.   Labs reviewed: Na: 134, CBGS: 117-121 (inpatient orders for glycemic control are 0-15 units insulin aspart TID with meals and 0-5 units insulin aspart daily at bedtime).    NUTRITION - FOCUSED PHYSICAL EXAM:  Flowsheet Row Most Recent Value  Orbital Region Mild depletion  Upper Arm Region Moderate depletion  Thoracic and Lumbar Region Mild depletion  Buccal Region Mild depletion  [place] Region Mild depletion  Clavicle Bone Region Severe depletion  Clavicle and Acromion Bone Region Severe depletion  Scapular Bone Region Severe depletion  Dorsal Hand Moderate depletion  Patellar Region No depletion  Anterior Thigh  Region No depletion  Posterior Calf Region No depletion  Edema (RD Assessment) Moderate  Hair Reviewed  Eyes Reviewed  Mouth Reviewed  Skin Reviewed  Nails Reviewed       Diet Order:   Diet Order             Diet heart healthy/carb modified Room service appropriate? Yes; Fluid consistency: Thin  Diet effective now                   EDUCATION NEEDS:   Education needs have been addressed  Skin:  Skin Assessment: Reviewed RN  Assessment  Last BM:  Unknown  Height:   Ht Readings from Last 1 Encounters:  10/01/22 5\' 8"  (1.727 m)    Weight:   Wt Readings from Last 1 Encounters:  10/01/22 83.9 kg    Ideal Body Weight:  70 kg  BMI:  Body mass index is 28.13 kg/m.  Estimated Nutritional Needs:   Kcal:  1900-2100  Protein:  90-105 grams  Fluid:  > 1.9 L    Loistine Chance, RD, LDN, [place] Registered Dietitian II Certified Diabetes Care and Education Specialist Please refer to [facility] for RD and/or RD on-call/weekend/after hours pager

## 2022-10-02 NOTE — ED Notes (Signed)
Pt ate entirety of meal tray.  

## 2022-10-02 NOTE — Progress Notes (Addendum)
Progress Note   Patient: Carlos Levine BLT:[id number] DOB: 23-Nov-1940 DOA: 10/01/2022     1 DOS: the patient was seen and examined on 10/02/2022   Brief hospital course: Mr. Zarius Furr is a 81 year old male with history of hypertension, hyperlipidemia, non-insulin-dependent diabetes mellitus type 2, atrial fibrillation, PVD, CKD 3B, hx of prostate cancer, hx of lung cancer, who presented to ED on 10/01/2022 for evaluation of blistering of his lower extremity, left greater than the lower right, and increased lower extremity swelling despite being started on Lasix recently.  Evaluation in the ED was consistent with decompensated CHF with BNP elevated 1339.2 and exam with tense edema of lower extremities.  Admitted to hospitalist service, started on IV Lasix for diuresis and IV antibiotics for cellulitis associated with open wounds from fluid-filled blisters on L>R feet.  Lower extremity doppler U/S was obtained and ruled out DVT's bilaterally.    Assessment and Plan: * Acute on chronic HFrEF (heart failure with reduced ejection fraction) ([place]) Recent echo on 09/08/2022 - severe LV systolic dysfunction EF 00%, diffuse hypo-contractility.  Normal right ventricular systolic function.   Mild valvular regurgitation.   Severe aortic and mitral stenosis. --Continue IV diuresis --Monitor renal function, electrolytes - Strict I's and O's --Low sodium diet --No need to repeat Echo at this time --Will consult [facility] cardiology if needed but responding as expected thus far --Close outpatient cardiology follow up  Cellulitis of both lower extremities Bilateral foot wounds  Due to fluid-filled blisters from volume overload that had ruptured.  Both lower legs / feet with significant warmth and erythema. --Continue Rocephin --Wound care consulted - wound care per instructions  PAF (paroxysmal atrial fibrillation) (HCC) HR is controlled Appears not on any rate control medication Continue  Eliquis Telemetry  Stage 3b chronic kidney disease (CKD) ([place]) Renal function at baseline. Monitor closely with diuresis.  History of prostate cancer Continue bicalutamide.  Squamous cell lung cancer, right (HCC) No acute issues.  Followed by Dr. Tasia Catchings. Ongoing CT surveillance.  ED (erectile dysfunction) of organic origin .  Hyperkalemia K was 5.7 on admission.  On IV Lasix. Today K normalized at 4.3 Monitor BMP  Pure hypercholesterolemia Continue statin  Type 2 diabetes mellitus with diabetic chronic kidney disease (HCC) Sliding scale Novolog. Hold Actos  Benign essential hypertension Currently with soft BP's on diuresis. - Labetalol IV PRN only for now  Elevated troponin Due to demand ischemia in setting of acute CHF.  No ischemic EKG changes or anginal symptoms.        Subjective: Pt seen in the ED, holding for a bed this AM.  Reports feeling about the same so far since admission.  Has persistent leg swelling and redness.  Reports not being able to eat or sleep for quite some time.  Says he initially lost 16 pounds when started on diuretic recently but gained back 8 lbs the other day. Denies significant increase in sodium intake, fluid intake and reports compliance with the medication.   Physical Exam: Vitals:   10/02/22 0800 10/02/22 1000 10/02/22 1214 10/02/22 1504  BP: (!) 102/59 105/82 93/67 90/68   Pulse: 75  92 80  Resp: 12  16 17   Temp: 98.1 F (36.7 C)  98 F (36.7 C) 97.7 F (36.5 C)  TempSrc:      SpO2: 99% 98% 98% 97%  Weight:      Height:       General exam: awake, alert, no acute distress HEENT: moist mucus membranes, hearing grossly normal  Respiratory system: CTAB but diminished bases, no wheezes, normal respiratory effort Cardiovascular system: normal S1/S2, RRR, , 2-3+ BLE edema Gastrointestinal system: soft, NT, ND, no HSM felt, +bowel sounds. Central nervous system: A&O x3. no gross focal neurologic deficits, normal  speech Extremities: BLE edema, distal BLE are erythematous and warm to touch, dressing over dorsal aspect of left foot Skin: dry, intact, erythematous distal BLE's Psychiatry: normal mood, congruent affect, judgement and insight appear normal   Data Reviewed:  Notable labs --- Na 134 from 130, K 4.3 from 5.7, glucose 126, BUN 35, Cr 1.48 from 1.56, Ca 8.7, GFR 47.  CBC normal    Lower extremity U/S negative b/l for DVT, but showed Subcutaneous soft tissue edema bilateral calf.   Family Communication: None present, will attempt to call wife  Disposition: Status is: Inpatient Remains inpatient appropriate because: remains on IV diuresis and IV antibiotic    Planned Discharge Destination: Home    Time spent: 45 minutes  Author: Ezekiel Slocumb, DO 10/02/2022 4:08 PM  For on call review [email or web address].

## 2022-10-02 NOTE — Assessment & Plan Note (Signed)
No acute issues.  Followed by Dr. Tasia Catchings. Ongoing CT surveillance.

## 2022-10-02 NOTE — Assessment & Plan Note (Addendum)
Bilateral foot wounds  Due to fluid-filled blisters from volume overload that had ruptured.  Both lower legs / feet with significant warmth and erythema. Completed antibiotic course --Wound care consulted - wound care per instructions

## 2022-10-02 NOTE — Care Management Important Message (Signed)
Important Message  Patient Details  Name: Carlos Levine MRN: [id number] Date of Birth: 1941-05-26   Medicare Important Message Given:  N/A - LOS <3 / Initial given by admissions     [name] 10/02/2022, 5:05 PM

## 2022-10-02 NOTE — Assessment & Plan Note (Signed)
Continue bicalutamide.

## 2022-10-03 ENCOUNTER — Ambulatory Visit: Payer: Medicare Other | Admitting: Oncology

## 2022-10-03 ENCOUNTER — Other Ambulatory Visit: Payer: Medicare Other

## 2022-10-03 DIAGNOSIS — I480 Paroxysmal atrial fibrillation: Secondary | ICD-10-CM | POA: Diagnosis not present

## 2022-10-03 DIAGNOSIS — E440 Moderate protein-calorie malnutrition: Secondary | ICD-10-CM | POA: Diagnosis not present

## 2022-10-03 DIAGNOSIS — I5023 Acute on chronic systolic (congestive) heart failure: Secondary | ICD-10-CM | POA: Diagnosis not present

## 2022-10-03 DIAGNOSIS — K5900 Constipation, unspecified: Secondary | ICD-10-CM | POA: Diagnosis present

## 2022-10-03 DIAGNOSIS — L03115 Cellulitis of right lower limb: Secondary | ICD-10-CM | POA: Diagnosis not present

## 2022-10-03 LAB — BASIC METABOLIC PANEL
Anion gap: 8 (ref 5–15)
BUN: 29 mg/dL — ABNORMAL HIGH (ref 8–23)
CO2: 27 mmol/L (ref 22–32)
Calcium: 8.4 mg/dL — ABNORMAL LOW (ref 8.9–10.3)
Chloride: 98 mmol/L (ref 98–111)
Creatinine, Ser: 1.38 mg/dL — ABNORMAL HIGH (ref 0.61–1.24)
GFR, Estimated: 51 mL/min — ABNORMAL LOW (ref >60)
Glucose, Bld: 106 mg/dL — ABNORMAL HIGH (ref 70–99)
Potassium: 3.8 mmol/L (ref 3.5–5.1)
Sodium: 133 mmol/L — ABNORMAL LOW (ref 135–145)

## 2022-10-03 LAB — BASIC METABOLIC PANEL WITH GFR
Anion gap: 8 (ref 5–15)
BUN: 30 mg/dL — ABNORMAL HIGH (ref 8–23)
CO2: 29 mmol/L (ref 22–32)
Calcium: 8.6 mg/dL — ABNORMAL LOW (ref 8.9–10.3)
Chloride: 95 mmol/L — ABNORMAL LOW (ref 98–111)
Creatinine, Ser: 1.46 mg/dL — ABNORMAL HIGH (ref 0.61–1.24)
GFR, Estimated: 47 mL/min — ABNORMAL LOW
Glucose, Bld: 61 mg/dL — ABNORMAL LOW (ref 70–99)
Potassium: 3.9 mmol/L (ref 3.5–5.1)
Sodium: 132 mmol/L — ABNORMAL LOW (ref 135–145)

## 2022-10-03 LAB — MAGNESIUM: Magnesium: 1.9 mg/dL (ref 1.7–2.4)

## 2022-10-03 LAB — GLUCOSE, CAPILLARY
Glucose-Capillary: 83 mg/dL (ref 70–99)
Glucose-Capillary: 263 mg/dL — ABNORMAL HIGH (ref 70–99)
Glucose-Capillary: 123 mg/dL — ABNORMAL HIGH (ref 70–99)
Glucose-Capillary: 129 mg/dL — ABNORMAL HIGH (ref 70–99)

## 2022-10-03 LAB — PHOSPHORUS: Phosphorus: 3.5 mg/dL (ref 2.5–4.6)

## 2022-10-03 MED ORDER — FUROSEMIDE 10 MG/ML IJ SOLN
20.0000 mg | Freq: Two times a day (BID) | INTRAMUSCULAR | Status: DC
  Administered 2022-10-03: 20 mg via INTRAVENOUS
  Filled 2022-10-03 (×2): qty 2

## 2022-10-03 MED ORDER — TRAZODONE HCL 50 MG PO TABS
25.0000 mg | ORAL_TABLET | Freq: Every evening | ORAL | Status: DC | PRN
  Administered 2022-10-04 – 2022-10-07 (×4): 25 mg via ORAL
  Filled 2022-10-03 (×4): qty 1

## 2022-10-03 MED ORDER — FUROSEMIDE 10 MG/ML IJ SOLN
20.0000 mg | Freq: Two times a day (BID) | INTRAMUSCULAR | Status: AC
  Administered 2022-10-04: 20 mg via INTRAVENOUS

## 2022-10-03 MED ORDER — TRAZODONE HCL 50 MG PO TABS
25.0000 mg | ORAL_TABLET | Freq: Once | ORAL | Status: AC
  Administered 2022-10-03: 25 mg via ORAL
  Filled 2022-10-03: qty 1

## 2022-10-03 NOTE — Progress Notes (Signed)
PT Cancellation Note  Patient Details Name: Carlos Levine MRN: [id number] DOB: 1941/08/27   Cancelled Treatment:    Reason Eval/Treat Not Completed: Patient very lethargic and declined to participate with PT this AM secondary to fatigue stating "I just want to sleep".  Will attempt to see pt at a future date/time as medically appropriate.     Linus Salmons PT, [name] 10/03/22, 9:48 AM

## 2022-10-03 NOTE — Assessment & Plan Note (Signed)
related to chronic illness (CHF) as evidenced by mild fat depletion, moderate fat depletion, mild muscle depletion, moderate muscle depletion.  Appreciate dietitian recommendations. -Liberalize diet to 2 gram sodium -MVI with minerals daily -Ensure Enlive po TID, each supplement provides 350 kcal and 20 grams of protein -500 mg vitamin C BID -220 mg zinc sulfate daily x 14 days

## 2022-10-03 NOTE — Evaluation (Signed)
Physical Therapy Evaluation Patient Details Name: Carlos Levine MRN: [id number] DOB: Dec 16, 1940 Today's Date: 10/03/2022  History of Present Illness  Pt is an 81 year old male with history of hypertension, prostate CA, lung CA, hyperlipidemia, non-insulin-dependent diabetes mellitus type 2, atrial fibrillation, PVD, CKD 3B, who presents emergency department for chief concerns of blistering of his lower extremities, left greater than the lower right.  MD assessment includes: Acute on chronic HFrEF, cellulitis of both lower extremities, elevated troponin due to demand ischemia, and hyperkalemia.   Clinical Impression  Pt lethargic and somewhat confused and offered very little in terms of verbal communication but was able to follow occasional 1-step commands with extra time and cuing.  Pt required surprisingly little assistance to come to sitting at the EOB and once in sitting was able to maintain static sitting balance most of the time but required occasional min A to prevent posterior LOB.  Multiple attempts made to come to standing from the EOB but even with +2 assist the pt was unable to clear the surface of the bed.  Pt will benefit from PT services in a SNF setting upon discharge to safely address deficits listed in patient problem list for decreased caregiver assistance and eventual return to PLOF.        Recommendations for follow up therapy are one component of a multi-disciplinary discharge planning process, led by the attending physician.  Recommendations may be updated based on patient status, additional functional criteria and insurance authorization.  Follow Up Recommendations Skilled nursing-short term rehab (<3 hours/day) Can patient physically be transported by private vehicle: No    Assistance Recommended at Discharge Frequent or constant Supervision/Assistance  Patient can return home with the following  Two people to help with walking and/or transfers;Two people to help with  bathing/dressing/bathroom;Direct supervision/assist for medications management;Assistance with cooking/housework;Help with stairs or ramp for entrance;Assist for transportation    Equipment Recommendations Other (comment) (TBD)  Recommendations for Other Services       Functional Status Assessment Patient has had a recent decline in their functional status and demonstrates the ability to make significant improvements in function in a reasonable and predictable amount of time.     Precautions / Restrictions Precautions Precautions: Fall Restrictions Weight Bearing Restrictions: No      Mobility  Bed Mobility Overal bed mobility: Needs Assistance Bed Mobility: Supine to Sit, Sit to Supine     Supine to sit: Min assist Sit to supine: Mod assist   General bed mobility comments: Min A for trunk control only durnig sup to sit and then Mod A for both LE and trunk control during sit to sup    Transfers                   General transfer comment: Multiple attempts to come to standing first without assist and then with heavy +2 assist with pt unable to clear the surface of the bed    Ambulation/Gait               General Gait Details: Unable/unsafe to attempt  Stairs            Wheelchair Mobility    Modified Rankin (Stroke Patients Only)       Balance Overall balance assessment: Needs assistance Sitting-balance support: Feet supported, Bilateral upper extremity supported Sitting balance-Leahy Scale: Poor Sitting balance - Comments: Pt able to maintain static sitting 90% of the time without physical assist required occasional min A to prevent posterior LOB Postural control:  Posterior lean     Standing balance comment: unable to stand                             Pertinent Vitals/Pain Pain Assessment Pain Assessment: PAINAD Breathing: normal Negative Vocalization: none Facial Expression: smiling or inexpressive Body Language:  relaxed Consolability: no need to console PAINAD Score: 0    Home Living Family/patient expects to be discharged to:: Unsure                   Additional Comments: Pt unable to provide history, no family available to assist    Prior Function Prior Level of Function : Patient poor historian/Family not available                     Hand Dominance        Extremity/Trunk Assessment   Upper Extremity Assessment Upper Extremity Assessment: Generalized weakness    Lower Extremity Assessment Lower Extremity Assessment: Generalized weakness       Communication      Cognition Arousal/Alertness: Lethargic Behavior During Therapy: Flat affect Overall Cognitive Status: No family/caregiver present to determine baseline cognitive functioning                                 General Comments: Pt lethargic and offered minimal verbal communication but was able to follow some 1-step commands with extra time and cuing        General Comments      Exercises Other Exercises Other Exercises: Static sitting at EOB for improved static sitting balance, core strength, and activity tolerance x 10 min   Assessment/Plan    PT Assessment Patient needs continued PT services  PT Problem List Decreased strength;Decreased activity tolerance;Decreased balance;Decreased knowledge of use of DME       PT Treatment Interventions DME instruction;Gait training;Functional mobility training;Therapeutic activities;Therapeutic exercise;Balance training;Patient/family education;Stair training    PT Goals (Current goals can be found in the Care Plan section)  Acute Rehab PT Goals PT Goal Formulation: Patient unable to participate in goal setting Time For Goal Achievement: 10/16/22 Potential to Achieve Goals: Fair    Frequency Min 2X/week     Co-evaluation               AM-PAC PT "6 Clicks" Mobility  Outcome Measure Help needed turning from your back to your side  while in a flat bed without using bedrails?: A Little Help needed moving from lying on your back to sitting on the side of a flat bed without using bedrails?: A Little Help needed moving to and from a bed to a chair (including a wheelchair)?: Total Help needed standing up from a chair using your arms (e.g., wheelchair or bedside chair)?: Total Help needed to walk in hospital room?: Total Help needed climbing 3-5 steps with a railing? : Total 6 Click Score: 10    End of Session Equipment Utilized During Treatment: Gait belt Activity Tolerance: Patient limited by lethargy Patient left: in bed;with call bell/phone within reach;with bed alarm set Nurse Communication: Mobility status PT Visit Diagnosis: Difficulty in walking, not elsewhere classified (R26.2);Muscle weakness (generalized) (M62.81)    Time: 3220-2542 PT Time Calculation (min) (ACUTE ONLY): 24 min   Charges:   PT Evaluation $PT Eval Moderate Complexity: 1 Mod PT Treatments $Therapeutic Activity: 8-22 mins       D. Scott Shanta Dorvil  PT, [name] 10/03/22, 3:19 PM

## 2022-10-03 NOTE — Progress Notes (Signed)
Progress Note   Patient: Carlos Levine YTK:[id number] DOB: April 15, 1941 DOA: 10/01/2022     2 DOS: the patient was seen and examined on 10/03/2022   Brief hospital course: [name] is a 81 year old male with history of hypertension, hyperlipidemia, non-insulin-dependent diabetes mellitus type 2, atrial fibrillation, PVD, CKD 3B, hx of prostate cancer, hx of lung cancer, who presented to ED on 10/01/2022 for evaluation of blistering of his lower extremity, left greater than the lower right, and increased lower extremity swelling despite being started on Lasix recently.  Evaluation in the ED was consistent with decompensated CHF with BNP elevated 1339.2 and exam with tense edema of lower extremities.  Admitted to hospitalist service, started on IV Lasix for diuresis and IV antibiotics for cellulitis associated with open wounds from fluid-filled blisters on L>R feet.  Lower extremity doppler U/S was obtained and ruled out DVT's bilaterally.    Assessment and Plan: * Acute on chronic HFrEF (heart failure with reduced ejection fraction) ([place]) Recent echo on 09/08/2022 - severe LV systolic dysfunction EF 55%, diffuse hypo-contractility.  Normal right ventricular systolic function.   Mild valvular regurgitation.   Severe aortic and mitral stenosis. 12/15: net I/O -2444, improving renal fx, LE edema improved --Continue IV diuresis --Monitor renal function, electrolytes - Strict I's and O's --Low sodium diet --No need to repeat Echo at this time --Will consult [facility] cardiology if needed but responding as expected thus far --Close outpatient cardiology follow up  Cellulitis of both lower extremities Bilateral foot wounds  Due to fluid-filled blisters from volume overload that had ruptured.  Both lower legs / feet with significant warmth and erythema. 12/15: erythema receding, today only feet, no longer extends up to mid-sin --Continue Rocephin --Wound care consulted - wound care per  instructions  PAF (paroxysmal atrial fibrillation) (HCC) HR is controlled Appears not on any rate control medication Continue Eliquis Telemetry  Stage 3b chronic kidney disease (CKD) ([place]) Renal function at baseline. Monitor closely with diuresis.  Constipation Bowel regimen with scheduled Miralax daily, Senna-S BID.  Hold if loose or frequent stools.  Malnutrition of moderate degree related to chronic illness (CHF) as evidenced by mild fat depletion, moderate fat depletion, mild muscle depletion, moderate muscle depletion.  Appreciate dietitian recommendations. -Liberalize diet to 2 gram sodium -MVI with minerals daily -Ensure Enlive po TID, each supplement provides 350 kcal and 20 grams of protein -500 mg vitamin C BID -220 mg zinc sulfate daily x 14 days  History of prostate cancer Continue bicalutamide.  Squamous cell lung cancer, right (HCC) No acute issues.  Followed by Dr. Tasia Catchings. Ongoing CT surveillance.  ED (erectile dysfunction) of organic origin .  Hyperkalemia K was 5.7 on admission.  On IV Lasix. Today K normal 3.8 from 4.3 Monitor BMP  Pure hypercholesterolemia Continue statin  Type 2 diabetes mellitus with diabetic chronic kidney disease (HCC) Sliding scale Novolog. Hold Actos  Benign essential hypertension Currently with soft BP's on diuresis. - Labetalol IV PRN only for now  Elevated troponin Due to demand ischemia in setting of acute CHF.  No ischemic EKG changes or anginal symptoms.        Subjective: Pt seen seated edge of bed this AM, and again midday with daughter and grandson visiting.  Pt reports swelling to be much better.  He reports feet being painful with weightbearing, denies known history of neuropathy.    Physical Exam: Vitals:   10/03/22 0556 10/03/22 0751 10/03/22 1128 10/03/22 1546  BP: (!) 99/58 94/76 Marland Kitchen)  104/55 92/67  Pulse: 100 88 96 62  Resp: 18 18  18   Temp: (!) 97.3 F (36.3 C) (!) 97.1 F (36.2 C)  (!) 97.4 F  (36.3 C)  TempSrc:      SpO2: 95% 98% 100% 97%  Weight:      Height:       General exam: awake, alert, no acute distress HEENT: moist mucus membranes, hearing grossly normal  Respiratory system: CTAB but diminished bases, no wheezes, normal respiratory effort Cardiovascular system: normal S1/S2, RRR, , 2-3+ BLE edema Gastrointestinal system: soft, NT, ND, no HSM felt, +bowel sounds. Central nervous system: A&O x3. no gross focal neurologic deficits, normal speech Extremities: BLE edema significantly improved (trace to 1+ today), distal BLE with receding erythema today only extends over foot with resolved erythema proximal to the ankles, dressing over dorsal aspect of left foot, severe b/l onychomycosis and overgrown toenails Skin: dry, intact, erythematous feet Psychiatry: normal mood, congruent affect, judgement and insight appear normal   Data Reviewed:  Notable labs --- Na 133, Cr improved 1.38, glucose 106, Ca 8.4, GFR 51.    Lower extremity U/S negative b/l for DVT, but showed Subcutaneous soft tissue edema bilateral calf.   Family Communication: None present, will attempt to call wife  Disposition: Status is: Inpatient Remains inpatient appropriate because: remains on IV diuresis and IV antibiotic    Planned Discharge Destination: Home    Time spent: 45 minutes  Author: Ezekiel Slocumb, DO 10/03/2022 4:33 PM  For on call review [email or web address].

## 2022-10-03 NOTE — Progress Notes (Signed)
Pt confused this morning, very lethargic, Dr. Arbutus Ped notified.

## 2022-10-03 NOTE — Progress Notes (Signed)
       CROSS COVER NOTE  NAME: Carlos Levine MRN: [id number] DOB : 11-05-1940   HPI/Events of Note   Nurse reports patient requesting something to help him sleep. He is upset about being in a semi private room and does not take anything at home for sleep.  Assessment and  Interventions   Assessment: Unable to accommodate private room Plan: One time dose of trazodone ordered 25 mg       Kathlene Cote NP [facility]

## 2022-10-03 NOTE — Assessment & Plan Note (Signed)
Bowel regimen with scheduled Miralax daily, Senna-S BID.  Hold if loose or frequent stools.

## 2022-10-03 NOTE — Progress Notes (Signed)
Heart Failure Nurse Navigator Note  Attempted to see patient x 2 today.  First time was when he told Pt that he just wanted to sleep and second time later in the day, he was asleep.  No family members present at bedside either time.  Will attempt later.  Pricilla Riffle RN [facility]

## 2022-10-04 DIAGNOSIS — I5023 Acute on chronic systolic (congestive) heart failure: Secondary | ICD-10-CM | POA: Diagnosis not present

## 2022-10-04 LAB — BASIC METABOLIC PANEL
Anion gap: 11 (ref 5–15)
BUN: 33 mg/dL — ABNORMAL HIGH (ref 8–23)
CO2: 28 mmol/L (ref 22–32)
Calcium: 9 mg/dL (ref 8.9–10.3)
Chloride: 93 mmol/L — ABNORMAL LOW (ref 98–111)
Creatinine, Ser: 1.32 mg/dL — ABNORMAL HIGH (ref 0.61–1.24)
GFR, Estimated: 54 mL/min — ABNORMAL LOW (ref >60)
Glucose, Bld: 138 mg/dL — ABNORMAL HIGH (ref 70–99)
Potassium: 4.2 mmol/L (ref 3.5–5.1)
Sodium: 132 mmol/L — ABNORMAL LOW (ref 135–145)

## 2022-10-04 LAB — GLUCOSE, CAPILLARY
Glucose-Capillary: 116 mg/dL — ABNORMAL HIGH (ref 70–99)
Glucose-Capillary: 135 mg/dL — ABNORMAL HIGH (ref 70–99)
Glucose-Capillary: 120 mg/dL — ABNORMAL HIGH (ref 70–99)
Glucose-Capillary: 129 mg/dL — ABNORMAL HIGH (ref 70–99)

## 2022-10-04 MED ORDER — CEFADROXIL 500 MG PO CAPS
500.0000 mg | ORAL_CAPSULE | Freq: Two times a day (BID) | ORAL | Status: AC
  Administered 2022-10-04 – 2022-10-07 (×8): 500 mg via ORAL
  Filled 2022-10-04 (×8): qty 1

## 2022-10-04 MED ORDER — MIDODRINE HCL 5 MG PO TABS
5.0000 mg | ORAL_TABLET | Freq: Three times a day (TID) | ORAL | Status: DC
  Administered 2022-10-05 (×3): 5 mg via ORAL
  Filled 2022-10-04 (×3): qty 1

## 2022-10-04 NOTE — Progress Notes (Addendum)
Progress Note   Patient: Carlos Levine FXT:[id number] DOB: 03-08-1941 DOA: 10/01/2022     3 DOS: the patient was seen and examined on 10/04/2022   Brief hospital course: [name] is a 81 year old male with history of hypertension, hyperlipidemia, non-insulin-dependent diabetes mellitus type 2, atrial fibrillation, PVD, CKD 3B, hx of prostate cancer, hx of lung cancer, who presented to ED on 10/01/2022 for evaluation of blistering of his lower extremity, left greater than the lower right, and increased lower extremity swelling despite being started on Lasix recently.  Evaluation in the ED was consistent with decompensated CHF with BNP elevated 1339.2 and exam with tense edema of lower extremities.  Admitted to hospitalist service, started on IV Lasix for diuresis and IV antibiotics for cellulitis associated with open wounds from fluid-filled blisters on L>R feet.  Lower extremity doppler U/S was obtained and ruled out DVT's bilaterally.    Assessment and Plan: * Acute on chronic HFrEF (heart failure with reduced ejection fraction) ([place]) Recent echo on 09/08/2022 - severe LV systolic dysfunction EF 29%, diffuse hypo-contractility.  Normal right ventricular systolic function.   Mild valvular regurgitation.   Severe aortic and mitral stenosis. 12/15: net I/O -2444, improving renal fx, LE edema improved 12/16: Cr bumped slightly, 1300 cc output yesterday, net I/O -3.2 L --Hold off diuresis today --Likely resume home torsemide tomorrow --Monitor renal function, electrolytes - Strict I's and O's --Low sodium diet --No need to repeat Echo at this time --Will consult [facility] cardiology if needed but responding as expected thus far --Close outpatient cardiology follow up  Cellulitis of both lower extremities Bilateral foot wounds  Due to fluid-filled blisters from volume overload that had ruptured.  Both lower legs / feet with significant warmth and erythema. 12/15: erythema receding,  today only feet, no longer extends up to mid-sin --Continue Rocephin --Wound care consulted - wound care per instructions  PAF (paroxysmal atrial fibrillation) (HCC) HR is controlled Appears not on any rate control medication Continue Eliquis Telemetry  Stage 3b chronic kidney disease (CKD) ([place]) Renal function at baseline. Monitor closely with diuresis.  Constipation Bowel regimen with scheduled Miralax daily, Senna-S BID.  Hold if loose or frequent stools.  Malnutrition of moderate degree related to chronic illness (CHF) as evidenced by mild fat depletion, moderate fat depletion, mild muscle depletion, moderate muscle depletion.  Appreciate dietitian recommendations. -Liberalize diet to 2 gram sodium -MVI with minerals daily -Ensure Enlive po TID, each supplement provides 350 kcal and 20 grams of protein -500 mg vitamin C BID -220 mg zinc sulfate daily x 14 days  History of prostate cancer Continue bicalutamide.  Squamous cell lung cancer, right (HCC) No acute issues.  Followed by Dr. Tasia Catchings. Ongoing CT surveillance.  ED (erectile dysfunction) of organic origin .  Hyperkalemia K was 5.7 on admission.  On IV Lasix. Today K normal 3.8 from 4.3 Monitor BMP  Pure hypercholesterolemia Continue statin  Type 2 diabetes mellitus with diabetic chronic kidney disease (HCC) Sliding scale Novolog. Hold Actos  Benign essential hypertension Currently with soft BP's on diuresis. - Labetalol IV PRN only for now  Elevated troponin Due to demand ischemia in setting of acute CHF.  No ischemic EKG changes or anginal symptoms.        Subjective: Pt sitting up in recliner this AM.  Reports he didn't sleep at all again.  Says he is very weak, "I came in here walking and now I'm going to need a wheelchair".  When asked about SNF/rehab, he  adamantly refuses, saying he will be going home with his wife.   No other acute complaints at this time.   Physical Exam: Vitals:   10/04/22  0415 10/04/22 0443 10/04/22 0804 10/04/22 1140  BP: (!) 96/57  103/65 111/80  Pulse: 90  99 84  Resp: 17  18 18   Temp: 97.7 F (36.5 C)  97.8 F (36.6 C) 97.8 F (36.6 C)  TempSrc: Oral     SpO2: 98%  94% 95%  Weight:  87.1 kg    Height:       General exam: awake, alert, no acute distress HEENT: moist mucus membranes, hearing grossly normal  Respiratory system: CTAB but diminished bases, no wheezes, normal respiratory effort, on room air Cardiovascular system: normal S1/S2, RRR, , 2-3+ BLE edema Central nervous system: A&O x3. no gross focal neurologic deficits, normal speech Extremities: BLE edema nearly resolved, some remaining in the feet. Skin: dry, intact, clean/dry/intact dressings on feet, erythema has further receded and fading Psychiatry: normal mood, congruent affect, judgement and insight appear normal   Data Reviewed:  Notable labs --- Na 132, Cl 93, glucose 138, BUN 33, Cr 1.32 from 1.46    Lower extremity U/S negative b/l for DVT, but showed Subcutaneous soft tissue edema bilateral calf.   Family Communication: daughter and grandson at bedside on rounds yesterday. Updated wife by phone this afternoon  Disposition: Status is: Inpatient Remains inpatient appropriate because: monitor renal function, off diuresis with rising Cr. Anticipate discharge tomorrow pending labs and clinical status tomorrow.    Planned Discharge Destination: Home    Time spent: 45 minutes  Author: Ezekiel Slocumb, DO 10/04/2022 2:31 PM  For on call review [email or web address].

## 2022-10-04 NOTE — Plan of Care (Signed)
  Problem: Clinical Measurements: Goal: Respiratory complications will improve Outcome: Progressing   Problem: Clinical Measurements: Goal: Cardiovascular complication will be avoided Outcome: Progressing   Problem: Nutrition: Goal: Adequate nutrition will be maintained Outcome: Progressing   Problem: Elimination: Goal: Will not experience complications related to urinary retention Outcome: Progressing   Problem: Pain Managment: Goal: General experience of comfort will improve Outcome: Progressing   Problem: Safety: Goal: Ability to remain free from injury will improve Outcome: Progressing

## 2022-10-04 NOTE — Progress Notes (Signed)
During bedside report, pt noted with confusion, aware of time and situation, dressing removed and requesting to go home. Dressing redressed, decline to have sock on, reoriented w/ reassurance. Will continue to monitor.

## 2022-10-04 NOTE — TOC Progression Note (Addendum)
Transition of Care [facility]) - Progression Note    Patient Details  Name: Carlos Levine MRN: [id number] Date of Birth: Feb 20, 1941  Transition of Care [facility]) CM/SW [place], LCSW Phone Number: 10/04/2022, 11:14 AM  Clinical Narrative:    CSW attempted call to patient's spouse to discuss SNF rec. Left VM requesting a return call.   2:55- Patient refuses SNF.  CSW called patient's wife again. She reported she agrees with patient's decision to want to come home. She states she and their daughter will care for patient at home. Declines home health and agrees to reach out to patient's PCP if they decide they want home health after patient is discharged tomorrow.  Patient's wife stated patient has a cane, walker with 2 wheels, shower chair, and bsc at home. She declines a wheelchair, says they do not have space for it.  She requests a rollator be ordered to be delivered to the home, she is aware home delivery would likely be Monday. Rollator referral made to [facility] with [employer] and asked DO for orders.  She states patient's daughter will pick him up tomorrow when he is discharged.      Expected Discharge Plan: Home/Self Care Barriers to Discharge: Continued Medical Work up  Expected Discharge Plan and Services Expected Discharge Plan: Home/Self Care   Discharge Planning Services: CM Consult   Living arrangements for the past 2 months: Mobile Home                           HH Arranged: Refused [place]           Social Determinants of Health (SDOH) Interventions    Readmission Risk Interventions     No data to display

## 2022-10-04 NOTE — Progress Notes (Signed)
   10/04/22 1601 10/04/22 1604 10/04/22 1606  Vitals  Temp 97.6 F (36.4 C)  --   --   Temp Source Axillary  --   --   BP 109/70  --   --   MAP (mmHg) 83  --   --   BP Location Right Arm  --   --   BP Method Automatic  --   --   Patient Position (if appropriate) Lying  --   --   Pulse Rate 85  --   --   Pulse Rate Source Dinamap  --   --   Resp 20  --   --   Level of Consciousness  Level of Consciousness Alert  --   --   Orthostatic Sitting  BP- Sitting  --  105/72  --   Pulse- Sitting  --  80  --   Orthostatic Standing at 0 minutes  BP- Standing at 0 minutes  --   --  105/62  Pulse- Standing at 0 minutes  --   --  78  Orthostatic Standing at 3 minutes  BP- Standing at 3 minutes  --   --   --   Pulse- Standing at 3 minutes  --   --   --   Oxygen Therapy  SpO2 94 %  --   --   O2 Device Room Air  --   --     10/04/22 1608  Vitals  Temp  --   Temp Source  --   BP  --   MAP (mmHg)  --   BP Location  --   BP Method  --   Patient Position (if appropriate)  --   Pulse Rate  --   Pulse Rate Source  --   Resp  --   Level of Consciousness  Level of Consciousness  --   Orthostatic Sitting  BP- Sitting  --   Pulse- Sitting  --   Orthostatic Standing at 0 minutes  BP- Standing at 0 minutes  --   Pulse- Standing at 0 minutes  --   Orthostatic Standing at 3 minutes  BP- Standing at 3 minutes (!) 89/57  Pulse- Standing at 3 minutes 103  Oxygen Therapy  SpO2  --   O2 Device  --    Orthostatic VS as above. Patient c/ dizziness with standing. Assistance provided during VS. MD notified of values.

## 2022-10-05 DIAGNOSIS — N1832 Chronic kidney disease, stage 3b: Secondary | ICD-10-CM | POA: Diagnosis not present

## 2022-10-05 DIAGNOSIS — R531 Weakness: Secondary | ICD-10-CM

## 2022-10-05 DIAGNOSIS — L03115 Cellulitis of right lower limb: Secondary | ICD-10-CM | POA: Diagnosis not present

## 2022-10-05 DIAGNOSIS — I5023 Acute on chronic systolic (congestive) heart failure: Secondary | ICD-10-CM | POA: Diagnosis not present

## 2022-10-05 DIAGNOSIS — I951 Orthostatic hypotension: Secondary | ICD-10-CM | POA: Diagnosis not present

## 2022-10-05 LAB — GLUCOSE, CAPILLARY
Glucose-Capillary: 135 mg/dL — ABNORMAL HIGH (ref 70–99)
Glucose-Capillary: 142 mg/dL — ABNORMAL HIGH (ref 70–99)
Glucose-Capillary: 148 mg/dL — ABNORMAL HIGH (ref 70–99)
Glucose-Capillary: 204 mg/dL — ABNORMAL HIGH (ref 70–99)

## 2022-10-05 LAB — BASIC METABOLIC PANEL
Anion gap: 7 (ref 5–15)
BUN: 34 mg/dL — ABNORMAL HIGH (ref 8–23)
CO2: 28 mmol/L (ref 22–32)
Calcium: 8.6 mg/dL — ABNORMAL LOW (ref 8.9–10.3)
Chloride: 96 mmol/L — ABNORMAL LOW (ref 98–111)
Creatinine, Ser: 1.29 mg/dL — ABNORMAL HIGH (ref 0.61–1.24)
GFR, Estimated: 55 mL/min — ABNORMAL LOW (ref >60)
Glucose, Bld: 128 mg/dL — ABNORMAL HIGH (ref 70–99)
Potassium: 4.4 mmol/L (ref 3.5–5.1)
Sodium: 131 mmol/L — ABNORMAL LOW (ref 135–145)

## 2022-10-05 MED ORDER — MIDODRINE HCL 5 MG PO TABS
10.0000 mg | ORAL_TABLET | Freq: Three times a day (TID) | ORAL | Status: DC
  Administered 2022-10-06 – 2022-10-10 (×13): 10 mg via ORAL
  Filled 2022-10-05 (×14): qty 2

## 2022-10-05 NOTE — Progress Notes (Signed)
PT Cancellation Note  Patient Details Name: Carlos Levine MRN: [id number] DOB: [birth year]   Cancelled Treatment:    Reason Eval/Treat Not Completed: Other (comment)  Pt in chair with daughter in room.  Stated he just got up to chair and was comfortable.  Reports continued dizziness and declined gait despite stating he has not walked since he has been here.  Reviewed discharge plan as per chart, family is declining wheelchair and opts for rollator for mobility.  Concerns voiced as pt has not been ambulatory in facility.  Daughter does not seem interested in discussing it further at this time.  Will continue per plan of care.  Stated she anticipated discharge home tomorrow.   Chesley Noon 10/05/2022, 12:48 PM

## 2022-10-05 NOTE — Progress Notes (Addendum)
Progress Note   Patient: Carlos Levine [id number] DOB: 05/12/1941 DOA: 10/01/2022     4 DOS: the patient was seen and examined on 10/05/2022   Brief hospital course: [name] is a 81 year old male with history of hypertension, hyperlipidemia, non-insulin-dependent diabetes mellitus type 2, atrial fibrillation, PVD, CKD 3B, hx of prostate cancer, hx of lung cancer, who presented to ED on 10/01/2022 for evaluation of blistering of his lower extremity, left greater than the lower right, and increased lower extremity swelling despite being started on Lasix recently.  Evaluation in the ED was consistent with decompensated CHF with BNP elevated 1339.2 and exam with tense edema of lower extremities.  Admitted to hospitalist service, started on IV Lasix for diuresis and IV antibiotics for cellulitis associated with open wounds from fluid-filled blisters on L>R feet.  Lower extremity doppler U/S was obtained and ruled out DVT's bilaterally.    Assessment and Plan: * Acute on chronic HFrEF (heart failure with reduced ejection fraction) ([place]) Recent echo on 09/08/2022 - severe LV systolic dysfunction EF 89%, diffuse hypo-contractility.  Normal right ventricular systolic function.   Mild valvular regurgitation.   Severe aortic and mitral stenosis. 12/15: net I/O -2444, improving renal fx, LE edema improved 12/16: Cr bumped slightly, 1300 cc output yesterday, net I/O -3.2 L. Diuresis held. --Unable to resume home diuretic with hypotension --Monitor renal function, electrolytes - Strict I's and O's --Low sodium diet --No need to repeat Echo at this time --Will consult [facility] cardiology if needed but responding as expected thus far --Close outpatient cardiology follow up  Orthostatic hypotension Pt reports dizziness with upright positioning and ambulation recently.  Orthostatic vitals 12/16 were positive.  Midodrine was started. --Increase midodrine to 10 mg TID --Daily orthostatic  vitals --Fall precautions --He won't tolerate TED hose due to wounds on his feet  Cellulitis of both lower extremities Bilateral foot wounds  Due to fluid-filled blisters from volume overload that had ruptured.  Both lower legs / feet with significant warmth and erythema. 12/15: erythema receding, today only feet, no longer extends up to mid-sin --Initially on Rocephin --Transitioned to cefadroxil 12/16 --Wound care consulted - wound care per instructions  PAF (paroxysmal atrial fibrillation) (HCC) HR is controlled Appears not on any rate control medication Continue Eliquis Telemetry  Stage 3b chronic kidney disease (CKD) ([place]) Renal function at baseline. Monitor closely with diuresis.  Generalized weakness PT/OT recommend SNF but patient declines this or home health PT/OT. --Fall precautions --May need EMS transport to get into home --4 wheeled walker with seat ordered as home too small for wheelchair --Continue PT/OT and mobility as tolerated  Constipation Bowel regimen with scheduled Miralax daily, Senna-S BID.  Hold if loose or frequent stools.  Malnutrition of moderate degree related to chronic illness (CHF) as evidenced by mild fat depletion, moderate fat depletion, mild muscle depletion, moderate muscle depletion.  Appreciate dietitian recommendations. -Liberalize diet to 2 gram sodium -MVI with minerals daily -Ensure Enlive po TID, each supplement provides 350 kcal and 20 grams of protein -500 mg vitamin C BID -220 mg zinc sulfate daily x 14 days  History of prostate cancer Continue bicalutamide.  Squamous cell lung cancer, right (HCC) No acute issues.  Followed by Dr. Tasia Catchings. Ongoing CT surveillance.  ED (erectile dysfunction) of organic origin .  Hyperkalemia K was 5.7 on admission.  On IV Lasix. Today K normal 3.8 from 4.3 Monitor BMP  Pure hypercholesterolemia Continue statin  Type 2 diabetes mellitus with diabetic chronic kidney disease  (  HCC) Sliding scale Novolog. Hold Actos  Benign essential hypertension Currently with soft BP's on diuresis. - Labetalol IV PRN only for now  Elevated troponin Due to demand ischemia in setting of acute CHF.  No ischemic EKG changes or anginal symptoms.   Patient with multiple comorbidities including multiple cancers, heart failure, now with progressive weakness and debility, orthostatic hypotension.  High risk for re-admission and further morbidity and mortality.  Palliative care consulted for GOC/code status discussions.      Subjective: Pt awake sitting up in bed this AM.  Reports feeling about the same, weak and tired.  He gets very dizzy when he gets up and admits this to be going on at home for some time, interferes with mobility and ADL's.  We discussed new medication to help keep BP up since it is low, he agrees.    Physical Exam: Vitals:   10/05/22 0420 10/05/22 0747 10/05/22 1052 10/05/22 1610  BP: 106/72 101/62 (!) 108/57 99/82  Pulse: 96   94  Resp: 18 16 20 20   Temp: 97.8 F (36.6 C) 98.1 F (36.7 C) 98 F (36.7 C) 98 F (36.7 C)  TempSrc: Oral Oral Oral Oral  SpO2: 99% 100% 100% 100%  Weight: 83.7 kg     Height:       General exam: awake, alert, no acute distress HEENT: moist mucus membranes, hearing grossly normal  Respiratory system: CTAB but diminished bases, no wheezes, normal respiratory effort, on room air Cardiovascular system: normal S1/S2, RRR, , 2-3+ BLE edema Central nervous system: A&O x3. no gross focal neurologic deficits, normal speech Extremities: no BLE edema, minimal edema remains in the feet. Skin: dry, intact, clean/dry/intact dressings on feet, erythema on feet further improved Psychiatry: normal mood, congruent affect, judgement and insight appear normal   Data Reviewed:  Notable labs ---  Na 131 from 132, Cl 96, glucose 128, BUN 34, Cr 1.29, Ca 8.6, GFR 55    Family Communication: none present, will attempt to call. Updated wife  by phone 12/16  afternoon daughter and grandson at bedside on rounds 12/15.  Disposition: Status is: Inpatient Remains inpatient appropriate because: monitor renal function, off diuresis with rising Cr. Anticipate discharge tomorrow pending labs and clinical status tomorrow.    Planned Discharge Destination: Home    Time spent: 45 minutes  Author: Ezekiel Slocumb, DO 10/05/2022 4:58 PM  For on call review [email or web address].

## 2022-10-05 NOTE — Assessment & Plan Note (Signed)
Pt reports dizziness with upright positioning and ambulation recently.  Orthostatic vitals 12/16 were positive.  Midodrine was started. 12/17 still orthostatic 12/18 orthostatics better today --Increased midodrine to 10 mg TID yesterday, continue  --Daily orthostatic vitals --Fall precautions --He won't tolerate TED hose due to wounds on his feet, currently tolerating twice daily p.o. low-dose Lasix

## 2022-10-05 NOTE — Assessment & Plan Note (Signed)
PT/OT recommend SNF but patient declines this or home health PT/OT. --Fall precautions --May need EMS transport to get into home --4 wheeled walker with seat ordered as home too small for wheelchair --Continue PT/OT and mobility as tolerated

## 2022-10-06 DIAGNOSIS — E871 Hypo-osmolality and hyponatremia: Secondary | ICD-10-CM | POA: Diagnosis present

## 2022-10-06 DIAGNOSIS — I5023 Acute on chronic systolic (congestive) heart failure: Secondary | ICD-10-CM | POA: Diagnosis not present

## 2022-10-06 LAB — BASIC METABOLIC PANEL
Anion gap: 12 (ref 5–15)
BUN: 37 mg/dL — ABNORMAL HIGH (ref 8–23)
CO2: 25 mmol/L (ref 22–32)
Calcium: 8.9 mg/dL (ref 8.9–10.3)
Chloride: 92 mmol/L — ABNORMAL LOW (ref 98–111)
Creatinine, Ser: 1.29 mg/dL — ABNORMAL HIGH (ref 0.61–1.24)
GFR, Estimated: 55 mL/min — ABNORMAL LOW (ref >60)
Glucose, Bld: 107 mg/dL — ABNORMAL HIGH (ref 70–99)
Potassium: 4.6 mmol/L (ref 3.5–5.1)
Sodium: 129 mmol/L — ABNORMAL LOW (ref 135–145)

## 2022-10-06 LAB — GLUCOSE, CAPILLARY
Glucose-Capillary: 128 mg/dL — ABNORMAL HIGH (ref 70–99)
Glucose-Capillary: 132 mg/dL — ABNORMAL HIGH (ref 70–99)
Glucose-Capillary: 88 mg/dL (ref 70–99)
Glucose-Capillary: 149 mg/dL — ABNORMAL HIGH (ref 70–99)

## 2022-10-06 LAB — OSMOLALITY: Osmolality: 282 mOsm/kg (ref 275–295)

## 2022-10-06 LAB — MAGNESIUM: Magnesium: 2.1 mg/dL (ref 1.7–2.4)

## 2022-10-06 MED ORDER — BISACODYL 5 MG PO TBEC: 5.0000 mg | DELAYED_RELEASE_TABLET | Freq: Every day | ORAL | Status: DC | PRN

## 2022-10-06 MED ORDER — FUROSEMIDE 20 MG PO TABS
20.0000 mg | ORAL_TABLET | Freq: Every day | ORAL | Status: DC
  Administered 2022-10-06: 20 mg via ORAL
  Filled 2022-10-06: qty 1

## 2022-10-06 NOTE — Assessment & Plan Note (Signed)
With pacemaker

## 2022-10-06 NOTE — Consult Note (Signed)
   Heart Failure Nurse Navigator Note  HFrEF percent.  Mild aortic insufficiency.  Severe aortic stenosis.  Mild mitral regurgitation.  Mild tricuspid regurgitation.  Mild pulmonary insufficiency.  He presented to the emergency room due to blistering of his lower extremities from from home.  He states the swelling has been going on approximately 1 month and he also noted shortness of breath with exertion for approximately the last year.  Comorbidities:  Hypertension Hyperlipidemia Diabetes Atrial fibrillation Peripheral vascular disease Chronic kidney disease stage III  Medications:  Apixaban 2.5 mg every 12 hours Furosemide 20 mg IV daily Midodrine 10 mg 3 times a day with meals Pravachol 80 mg at bedtime  Labs:  Sodium 129, potassium 4.6, chloride 92, CO2 25, BUN 37, creatinine 1.29, GFR  55. Weight is 85.3 kg Intake 780 mL Output 1100 mL  Initial meeting with patient who was sitting up in the recliner at bedside, legs were elevated.  He appears oriented x 3.  States that he lives at home with his wife.  Discussed  diet, he states with his wife's health that they had been eating a lot of frozen dinners and frankly he adds that he is very tired of the dose.  Discussed the importance of not using salt at the table and maintaining fluid restriction of no more than 64 ounces daily.  Explained all that is considered a liquid.  Went over daily weights.  Patient states that this time he only weighs himself a couple times a week.  Planed the importance of daily weight and reporting a 2 pound weight gain overnight or total 5 pounds within the week.  He was given the living with heart failure teaching booklet, zone magnet, info on low-sodium and heart failure along with weight chart.  Patient states at this time he does not have his glasses so he he is unable to read the printed material.  Also  made aware he has follow-up in the outpatient heart failure clinic On January 3 at  1:30.   Had no further questions.  Will continue to follow along.  Pricilla Riffle RN [facility]

## 2022-10-06 NOTE — Progress Notes (Addendum)
Mobility Specialist - Progress Note   10/06/22 1300  Mobility  Activity Transferred from chair to bed  Level of Assistance Maximum assist, patient does 25-49%  Assistive Device Front wheel walker;None;Other (Comment)  Distance Ambulated (ft) 0 ft  Activity Response Tolerated fair  $Mobility charge 1 Mobility     Pt sitting in recliner upon arrival, utilizing 2L. Pt fatigued and less alert on arrival, unable to stand with maxA +2 and multiple attempts. Foot blocking on BLE to prevent sliding even with grip socks. Increased weakness in all extremities this session as compared to this AM. Increased time to process information. Pt scoot transfer chair-bed with maxA +2. Assist to return supine. Pt left in bed with alarm set, needs in reach. RN notified.    Kathee Delton Mobility Specialist 10/06/22, 2:03 PM

## 2022-10-06 NOTE — Plan of Care (Signed)
°  Problem: Education: °Goal: Ability to demonstrate management of disease process will improve °Outcome: Progressing °Goal: Ability to verbalize understanding of medication therapies will improve °Outcome: Progressing °Goal: Individualized Educational Video(s) °Outcome: Progressing °  °

## 2022-10-06 NOTE — NC FL2 (Signed)
[place] LEVEL OF CARE FORM     IDENTIFICATION  Patient Name: Carlos Levine Birthdate: Oct 19, 1941 Sex: male Admission Date (Current Location): 10/01/2022  [facility] and [place] Number:  Engineering geologist and Address:  [facility], [address] [place], [place] [address]      Provider Number: [id number]  Attending Physician Name and Address:  Ezekiel Slocumb, DO  Relative Name and Phone Number:       Current Level of Care: Hospital Recommended Level of Care: [place] Prior Approval Number:    Date Approved/Denied:   PASRR Number:    Discharge Plan: SNF    Current Diagnoses: Patient Active Problem List   Diagnosis Date Noted   Hyponatremia 10/06/2022   Orthostatic hypotension 10/05/2022   Generalized weakness 10/05/2022   Malnutrition of moderate degree 10/03/2022   Constipation 10/03/2022   Cellulitis of both lower extremities 10/02/2022   Acute on chronic HFrEF (heart failure with reduced ejection fraction) ([place]) 10/01/2022   Drug-induced gynecomastia 04/03/2022   PAF (paroxysmal atrial fibrillation) ([place]) 04/03/2022   Loss of weight 12/18/2021   History of prostate cancer 12/18/2021   Sick sinus syndrome ([place]) 07/25/2020   SOB (shortness of breath) 05/26/2019   SVT (supraventricular tachycardia) 05/26/2019   Squamous cell lung cancer, right ([place]) 04/20/2019   Localized enlarged lymph nodes 03/29/2019   Goals of care, counseling/discussion 03/29/2019   Chronic bilateral low back pain without sciatica 02/21/2018   Chronic constipation 11/19/2017   Hyperkalemia 11/19/2017   Elevated troponin 06/11/2017   Dizziness 06/24/2016   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   Sore of lower lip 05/13/2015   Benign essential hypertension 02/02/2015   Type 2 diabetes mellitus with diabetic chronic kidney disease ([place]) 02/02/2015   Pure hypercholesterolemia 02/02/2015   Lumbar degenerative  disc disease 02/02/2015   Stage 3b chronic kidney disease (CKD) ([place]) 05/12/2014   Malignant neoplasm of prostate ([place]) 07/14/2012   Hypertrophy of breast 07/14/2012   ED (erectile dysfunction) of organic origin 07/14/2012    Orientation RESPIRATION BLADDER Height & Weight     Self, Time, Situation, Place  O2 (Nasal Cannula 1 L) Incontinent, External catheter Weight: 188 lb 1.6 oz (85.3 kg) Height:  5\' 8"  (172.7 cm)  BEHAVIORAL SYMPTOMS/MOOD NEUROLOGICAL BOWEL NUTRITION STATUS   (None)  (None) Continent Diet (2 gram sodium)  AMBULATORY STATUS COMMUNICATION OF NEEDS Skin   Limited Assist Verbally Skin abrasions, Bruising, Other (Comment) (Blister, erythema/redness, weeping.)                       Personal Care Assistance Level of Assistance  Bathing, Feeding, Dressing Bathing Assistance: Limited assistance Feeding assistance: Limited assistance Dressing Assistance: Limited assistance     Functional Limitations Info  Sight, Hearing, Speech Sight Info: Adequate Hearing Info: Adequate Speech Info: Adequate    SPECIAL CARE FACTORS FREQUENCY  PT (By licensed PT), OT (By licensed OT)     PT Frequency: 5 x week OT Frequency: 5 x week            Contractures Contractures Info: Not present    Additional Factors Info  Code Status, Allergies Code Status Info: Full code Allergies Info: Contrast Media (Iodinated Contrast Media), Ace Inhibitors           Current Medications (10/06/2022):  This is the current hospital active medication list Current Facility-Administered Medications  Medication Dose Route Frequency Provider Last Rate Last Admin   acetaminophen (TYLENOL)  tablet 650 mg  650 mg Oral Q6H PRN Cox, Amy N, DO   650 mg at 10/05/22 2235   Or   acetaminophen (TYLENOL) suppository 650 mg  650 mg Rectal Q6H PRN Cox, Amy N, DO       apixaban (ELIQUIS) tablet 2.5 mg  2.5 mg Oral Q12H Cox, Amy N, DO   2.5 mg at 10/06/22 8413   ascorbic acid (VITAMIN C) tablet 500 mg   500 mg Oral BID Nicole Kindred A, DO   500 mg at 10/06/22 0809   bacitracin ointment   Topical BID Cox, Amy N, DO   Given at 10/05/22 2236   bicalutamide (CASODEX) tablet 50 mg  50 mg Oral Q1200 Cox, Amy N, DO   50 mg at 10/06/22 1147   bisacodyl (DULCOLAX) EC tablet 5 mg  5 mg Oral Daily PRN Nicole Kindred A, DO       cefadroxil (DURICEF) capsule 500 mg  500 mg Oral BID Nicole Kindred A, DO   500 mg at 10/06/22 2440   feeding supplement (ENSURE ENLIVE / ENSURE PLUS) liquid 237 mL  237 mL Oral TID BM Nicole Kindred A, DO   237 mL at 10/06/22 1027   furosemide (LASIX) tablet 20 mg  20 mg Oral Daily Nicole Kindred A, DO   20 mg at 10/06/22 2536   insulin aspart (novoLOG) injection 0-15 Units  0-15 Units Subcutaneous TID WC Cox, Amy N, DO   2 Units at 10/06/22 1146   insulin aspart (novoLOG) injection 0-5 Units  0-5 Units Subcutaneous QHS Cox, Amy N, DO   2 Units at 10/05/22 2234   ipratropium-albuterol (DUONEB) 0.5-2.5 (3) MG/3ML nebulizer solution 3 mL  3 mL Nebulization BID PRN Cox, Amy N, DO       labetalol (NORMODYNE) injection 5 mg  5 mg Intravenous Q3H PRN Cox, Amy N, DO       melatonin tablet 5 mg  5 mg Oral QHS PRN Nicole Kindred A, DO       midodrine (PROAMATINE) tablet 10 mg  10 mg Oral TID WC Nicole Kindred A, DO   10 mg at 10/06/22 1146   multivitamin with minerals tablet 1 tablet  1 tablet Oral Daily Nicole Kindred A, DO   1 tablet at 10/06/22 0809   ondansetron (ZOFRAN) tablet 4 mg  4 mg Oral Q6H PRN Cox, Amy N, DO       Or   ondansetron (ZOFRAN) injection 4 mg  4 mg Intravenous Q6H PRN Cox, Amy N, DO       polyethylene glycol (MIRALAX / GLYCOLAX) packet 17 g  17 g Oral Daily Nicole Kindred A, DO   17 g at 10/06/22 0810   pravastatin (PRAVACHOL) tablet 80 mg  80 mg Oral QHS Cox, Amy N, DO   80 mg at 10/05/22 2235   pregabalin (LYRICA) capsule 50 mg  50 mg Oral BID Cox, Amy N, DO   50 mg at 10/06/22 0809   senna-docusate (Senokot-S) tablet 1 tablet  1 tablet Oral BID Nicole Kindred A, DO   1 tablet at 10/06/22 0809   traZODone (DESYREL) tablet 25 mg  25 mg Oral QHS PRN Nicole Kindred A, DO   25 mg at 10/05/22 2235   zinc sulfate capsule 220 mg  220 mg Oral Daily Nicole Kindred A, DO   220 mg at 10/06/22 0809     Discharge Medications: Please see discharge summary for a list of discharge medications.  Relevant  Imaging Results:  Relevant Lab Results:   Additional Information SS#: [id number]  Candie Chroman, LCSW

## 2022-10-06 NOTE — TOC Progression Note (Signed)
Transition of Care [facility]) - Progression Note    Patient Details  Name: Carlos Levine MRN: [id number] Date of Birth: Feb 13, 1941  Transition of Care [facility]) CM/SW [place], LCSW Phone Number: 10/06/2022, 12:20 PM  Clinical Narrative:   Patient is now interested in SNF placement. Sent out referral.  Expected Discharge Plan: Home/Self Care Barriers to Discharge: Continued Medical Work up  Expected Discharge Plan and Services Expected Discharge Plan: Home/Self Care   Discharge Planning Services: CM Consult   Living arrangements for the past 2 months: Mobile Home                           HH Arranged: Refused [employer]           Social Determinants of Health (SDOH) Interventions    Readmission Risk Interventions     No data to display

## 2022-10-06 NOTE — Assessment & Plan Note (Addendum)
Na was 130 on admission, improved to 134 with diuresis.  Once euvolemic, diuresis held, have not been able to resume home Lasix due to symptomatic hypotension.  On Lasix was held, patient started having dilutional hyponatremia and started trending downward.  Lasix has been restarted, sodium continues to improve, 132 on day of discharge.

## 2022-10-06 NOTE — Progress Notes (Signed)
Progress Note   Patient: Carlos Levine:[id number] DOB: 09/23/1941 DOA: 10/01/2022     5 DOS: the patient was seen and examined on 10/06/2022   Brief hospital course: [name] is a 81 year old male with history of hypertension, hyperlipidemia, non-insulin-dependent diabetes mellitus type 2, atrial fibrillation, PVD, CKD 3B, hx of prostate cancer, hx of lung cancer, who presented to ED on 10/01/2022 for evaluation of blistering of his lower extremity, left greater than the lower right, and increased lower extremity swelling despite being started on Lasix recently.  Evaluation in the ED was consistent with decompensated CHF with BNP elevated 1339.2 and exam with tense edema of lower extremities.  Admitted to hospitalist service, started on IV Lasix for diuresis and IV antibiotics for cellulitis associated with open wounds from fluid-filled blisters on L>R feet.  Lower extremity doppler U/S was obtained and ruled out DVT's bilaterally.    Assessment and Plan: * Acute on chronic HFrEF (heart failure with reduced ejection fraction) ([place]) Recent echo on 09/08/2022 - severe LV systolic dysfunction EF 77%, diffuse hypo-contractility.  Normal right ventricular systolic function.   Mild valvular regurgitation.   Severe aortic and mitral stenosis. 12/15: net I/O -2444, improving renal fx, LE edema improved 12/16: Cr bumped slightly, 1300 cc output yesterday, net I/O -3.2 L. Diuresis held. 12/17: BP's still soft, little better --Resume home diuretic  --Monitor renal function, electrolytes - Strict I's and O's --Low sodium diet --No need to repeat Echo at this time --Close outpatient cardiology follow up at [facility]  Orthostatic hypotension Pt reports dizziness with upright positioning and ambulation recently.  Orthostatic vitals 12/16 were positive.  Midodrine was started. 12/17 still orthostatic 12/18 orthostatics better today --Increased midodrine to 10 mg TID yesterday, continue   --Daily orthostatic vitals --Fall precautions --He won't tolerate TED hose due to wounds on his feet  Cellulitis of both lower extremities Bilateral foot wounds  Due to fluid-filled blisters from volume overload that had ruptured.  Both lower legs / feet with significant warmth and erythema. 12/15: erythema receding, today only feet, no longer extends up to mid-sin --Initially on Rocephin --Transitioned to cefadroxil 12/16 --Wound care consulted - wound care per instructions  PAF (paroxysmal atrial fibrillation) (HCC) HR is controlled Appears not on any rate control medication Continue Eliquis Telemetry  Stage 3b chronic kidney disease (CKD) ([place]) Renal function at baseline. Monitor closely with diuresis.  Hyponatremia Na was 130 on admission, improved to 134 with diuresis.  Once euvolemic, diuresis held, have not been able to resume home Lasix due to symptomatic hypotension.   12/18: Na 129, slowly trending down past few days --resume home Lasix 20 mg PO daily --add-on serum osmolality --daily BMP  Generalized weakness PT/OT recommend SNF but patient declines this or home health PT/OT. --Fall precautions --May need EMS transport to get into home --4 wheeled walker with seat ordered as home too small for wheelchair --Continue PT/OT and mobility as tolerated  Constipation Bowel regimen with scheduled Miralax daily, Senna-S BID.  Hold if loose or frequent stools.  Malnutrition of moderate degree related to chronic illness (CHF) as evidenced by mild fat depletion, moderate fat depletion, mild muscle depletion, moderate muscle depletion.  Appreciate dietitian recommendations. -Liberalize diet to 2 gram sodium -MVI with minerals daily -Ensure Enlive po TID, each supplement provides 350 kcal and 20 grams of protein -500 mg vitamin C BID -220 mg zinc sulfate daily x 14 days  History of prostate cancer Continue bicalutamide.  Sick sinus syndrome ([place])  With  pacemaker  Squamous cell lung cancer, right ([place]) No acute issues.  Followed by Dr. Tasia Catchings. Ongoing CT surveillance.  ED (erectile dysfunction) of organic origin .  Hyperkalemia K was 5.7 on admission.  On IV Lasix. Today K normal 3.8 from 4.3 Monitor BMP  Pure hypercholesterolemia Continue statin  Type 2 diabetes mellitus with diabetic chronic kidney disease (HCC) Sliding scale Novolog. Hold Actos  Benign essential hypertension Currently with soft BP's on diuresis. - Labetalol IV PRN only for now  Elevated troponin Due to demand ischemia in setting of acute CHF.  No ischemic EKG changes or anginal symptoms.   Patient with multiple comorbidities including multiple cancers, heart failure, now with progressive weakness and debility, orthostatic hypotension.  High risk for re-admission and further morbidity and mortality.  Palliative care consulted for GOC/code status discussions.      Subjective: Pt awake sitting up in recliner this AM.  He reports having a rough night.  Needed to get up multiple times to attempt having a BM, took a long time for help to come.  He says he still has no BM since a week ago today, but chart has type 4 stool documented from overnight.  He was hallucinating at one point, but this AM is aware he was confused.  Reports he still feels "lousy", dizzy on standing.  Orthostatic vitals today were much better.   Physical Exam: Vitals:   10/05/22 2239 10/06/22 0318 10/06/22 0751 10/06/22 1137  BP: 134/86 123/78 111/78 111/79  Pulse: 91 60 (!) 110 81  Resp: 19 18 16 18   Temp: (!) 97.5 F (36.4 C) (!) 97.5 F (36.4 C) 98.3 F (36.8 C) 97.7 F (36.5 C)  TempSrc: Oral Oral  Oral  SpO2: 94% 93% 100% 100%  Weight:  85.3 kg    Height:       General exam: awake, alert, no acute distress HEENT: moist mucus membranes, hearing grossly normal  Respiratory system: CTAB but diminished bases, no wheezes, normal respiratory effort, on room air Cardiovascular  system: normal S1/S2, RRR, , 2-3+ BLE edema Central nervous system: A&O x3. no gross focal neurologic deficits, normal speech Extremities: no BLE edema, minimal edema remains in the feet. Skin: dry, intact, clean/dry/intact dressings on feet, erythema on feet further improved Psychiatry: normal mood, congruent affect, judgement and insight appear normal   Data Reviewed:  Notable labs ---  Na 131 >> 129, Cl 92, glucose 107, BUN 37, Cr 1.29 stable    Family Communication: none present, will attempt to call. Updated wife by phone 12/16  afternoon daughter and grandson at bedside on rounds 12/15.  Disposition: Status is: Inpatient Remains inpatient appropriate because: monitor renal function, off diuresis with rising Cr. Anticipate discharge tomorrow pending labs and clinical status tomorrow.    Planned Discharge Destination: Home (SNF if he agrees)     Time spent: 40 minutes  Author: Ezekiel Slocumb, DO 10/06/2022 3:35 PM  For on call review [email or web address].

## 2022-10-06 NOTE — Progress Notes (Signed)
Physical Therapy Treatment Patient Details Name: Carlos Levine MRN: [id number] DOB: [birth year] Today's Date: 10/06/2022   History of Present Illness Pt is an 81 year old male with history of hypertension, prostate CA, lung CA, hyperlipidemia, non-insulin-dependent diabetes mellitus type 2, atrial fibrillation, PVD, CKD 3B, who presents emergency department for chief concerns of blistering of his lower extremities, left greater than the lower right.  MD assessment includes: Acute on chronic HFrEF, cellulitis of both lower extremities, elevated troponin due to demand ischemia, and hyperkalemia.    PT Comments    Patient in recliner on arrival and agreeable to PT tx session. Patient continues to have come confusion and hallucinate but able to acknowledge when he states information about "going home this morning" when brought to attention. Able to stand from recliner and EOB with min guard and increased time. Ambulated around bed x 2 with minA and RW but required seated rest break between bouts. Discussed with patient about SNF rehab prior to returning home with patient willing to consider. Patient states wife is not there all of the time to provide assistance. Notified Education officer, museum about patient consideration. Continue to recommend SNF for ongoing Physical Therapy.       Recommendations for follow up therapy are one component of a multi-disciplinary discharge planning process, led by the attending physician.  Recommendations may be updated based on patient status, additional functional criteria and insurance authorization.  Follow Up Recommendations  Skilled nursing-short term rehab (<3 hours/day) Can patient physically be transported by private vehicle: No   Assistance Recommended at Discharge Frequent or constant Supervision/Assistance  Patient can return home with the following Two people to help with walking and/or transfers;Two people to help with bathing/dressing/bathroom;Direct  supervision/assist for medications management;Assistance with cooking/housework;Help with stairs or ramp for entrance;Assist for transportation   Equipment Recommendations  Rolling Kyllie Pettijohn (2 wheels);Wheelchair (measurements PT);Wheelchair cushion (measurements PT)    Recommendations for Other Services       Precautions / Restrictions Precautions Precautions: Fall Restrictions Weight Bearing Restrictions: No     Mobility  Bed Mobility               General bed mobility comments: in recliner at beginning and end of session    Transfers Overall transfer level: Needs assistance Equipment used: Rolling Enma Maeda (2 wheels) Transfers: Sit to/from Stand Sit to Stand: Min guard           General transfer comment: increased time to come into standing but able to complete without assistance. Cues for hand placement    Ambulation/Gait Ambulation/Gait assistance: Min assist Gait Distance (Feet): 15 Feet (+15') Assistive device: Rolling Merdith Adan (2 wheels) Gait Pattern/deviations: Step-to pattern, Decreased stride length, Scissoring, Narrow base of support, Trunk flexed Gait velocity: decreased     General Gait Details: reports dizziness but able to complete short distances around bed. MinA for balance and RW management. Cues for maintaining close proximity to [employer] Mobility    Modified Rankin (Stroke Patients Only)       Balance Overall balance assessment: Needs assistance Sitting-balance support: Feet supported, Bilateral upper extremity supported Sitting balance-Leahy Scale: Good     Standing balance support: Bilateral upper extremity supported, Reliant on assistive device for balance Standing balance-Leahy Scale: Poor Standing balance comment: reliant on UE support  Cognition Arousal/Alertness: Awake/alert Behavior During Therapy: Flat affect Overall Cognitive Status: No  family/caregiver present to determine baseline cognitive functioning                                 General Comments: reports hallucinations and stating he went home this morning and was unable to get off couch but once he stated this, was able to acknowledge that he was hallucinating        Exercises      General Comments General comments (skin integrity, edema, etc.): VSS on 2L      Pertinent Vitals/Pain Pain Assessment Pain Assessment: Faces Faces Pain Scale: No hurt Pain Intervention(s): Monitored during session    Home Living                          Prior Function            PT Goals (current goals can now be found in the care plan section) Acute Rehab PT Goals PT Goal Formulation: Patient unable to participate in goal setting Time For Goal Achievement: 10/16/22 Potential to Achieve Goals: Fair Progress towards PT goals: Progressing toward goals    Frequency    Min 2X/week      PT Plan Current plan remains appropriate    Co-evaluation              AM-PAC PT "6 Clicks" Mobility   Outcome Measure  Help needed turning from your back to your side while in a flat bed without using bedrails?: A Little Help needed moving from lying on your back to sitting on the side of a flat bed without using bedrails?: A Little Help needed moving to and from a bed to a chair (including a wheelchair)?: A Little Help needed standing up from a chair using your arms (e.g., wheelchair or bedside chair)?: A Little Help needed to walk in hospital room?: A Lot Help needed climbing 3-5 steps with a railing? : Total 6 Click Score: 15    End of Session Equipment Utilized During Treatment: Gait belt;Oxygen Activity Tolerance: Patient tolerated treatment well Patient left: in chair;with call bell/phone within reach;with chair alarm set Nurse Communication: Mobility status PT Visit Diagnosis: Difficulty in walking, not elsewhere classified  (R26.2);Muscle weakness (generalized) (M62.81)     Time: 1791-5056 PT Time Calculation (min) (ACUTE ONLY): 28 min  Charges:  $Gait Training: 23-37 mins                     Taran Hable A. Gilford Rile PT, [name] [facility] - Acute Rehabilitation Services    Abby Tucholski A Claris Pech 10/06/2022, 12:06 PM

## 2022-10-06 NOTE — Progress Notes (Addendum)
Mobility Specialist - Progress Note   10/06/22 1000  Mobility  Activity Transferred from bed to chair  Level of Assistance Minimal assist, patient does 75% or more  Assistive Device None  Distance Ambulated (ft) 2 ft  Activity Response Tolerated well  $Mobility charge 1 Mobility     Pt lying in bed upon arrival, utilizing 2L. Pt dozes off in between conversation, but becomes more alert once repositioned to EOB. ModA for bed mobility. Pt declined use of RW. MinA STS and few steps to recliner. HR upper 110s. Pt left in chair with alarm set, needs in reach. Assisted with breakfast tray set-up prior to exit.    Kathee Delton Mobility Specialist 10/06/22, 10:12 AM

## 2022-10-06 NOTE — Evaluation (Signed)
Occupational Therapy Evaluation Patient Details Name: Carlos Levine MRN: [id number] DOB: 07/12/1941 Today's Date: 10/06/2022   History of Present Illness Pt is an 81 year old male with history of hypertension, prostate CA, lung CA, hyperlipidemia, non-insulin-dependent diabetes mellitus type 2, atrial fibrillation, PVD, CKD 3B, who presents emergency department for chief concerns of blistering of his lower extremities, left greater than the lower right.  MD assessment includes: Acute on chronic HFrEF, cellulitis of both lower extremities, elevated troponin due to demand ischemia, and hyperkalemia.   Clinical Impression   Patient presenting with decreased Ind in self care,balance, functional mobility/transfers, endurance, and safety awareness. Patient is oriented to self only. His legs are thrown over bed rails and he is mumbling to himself with eyes closed. No family present to confirm baseline. Pt reporting he lives with wife in a trailer that has 7 steps to enter. NT arrives to get orthostatics with therapist assisting. Pt needing mod A overall for bed mobility and sit <>stand. He tolerated standing for ~ 2 minutes but then needing to return to bed. Sit >supine with mod A for safety. Pt's eyes closed and appears asleep before therapist can exit the room. Fall mat placed on floor. All needs within reach and bed alarm activated.  Patient will benefit from acute OT to increase overall independence in the areas of ADLs, functional mobility, and safety awareness in order to safely discharge to next venue of care.      Recommendations for follow up therapy are one component of a multi-disciplinary discharge planning process, led by the attending physician.  Recommendations may be updated based on patient status, additional functional criteria and insurance authorization.   Follow Up Recommendations  Skilled nursing-short term rehab (<3 hours/day)     Assistance Recommended at Discharge Frequent or  constant Supervision/Assistance  Patient can return home with the following A lot of help with bathing/dressing/bathroom;A lot of help with walking and/or transfers;Assist for transportation;Direct supervision/assist for financial management;Direct supervision/assist for medications management;Help with stairs or ramp for entrance;Assistance with cooking/housework    Functional Status Assessment  Patient has had a recent decline in their functional status and demonstrates the ability to make significant improvements in function in a reasonable and predictable amount of time.  Equipment Recommendations  Other (comment) (defer to next venue of care)       Precautions / Restrictions Precautions Precautions: Fall Restrictions Weight Bearing Restrictions: No      Mobility Bed Mobility Overal bed mobility: Needs Assistance Bed Mobility: Supine to Sit, Sit to Supine     Supine to sit: Mod assist Sit to supine: Mod assist   General bed mobility comments: trunk support and B LEs    Transfers Overall transfer level: Needs assistance Equipment used: 1 person hand held assist Transfers: Sit to/from Stand Sit to Stand: Mod assist                  Balance Overall balance assessment: Needs assistance Sitting-balance support: Feet supported, Bilateral upper extremity supported Sitting balance-Leahy Scale: Good Sitting balance - Comments: static sitting on EOB   Standing balance support: Bilateral upper extremity supported, Reliant on assistive device for balance Standing balance-Leahy Scale: Poor Standing balance comment: reliant on UE support                           ADL either performed or assessed with clinical judgement   ADL Overall ADL's : Needs assistance/impaired  Vision Patient Visual Report: No change from baseline              Pertinent Vitals/Pain Pain Assessment Pain Assessment:  Faces Faces Pain Scale: No hurt        Extremity/Trunk Assessment Upper Extremity Assessment Upper Extremity Assessment: Generalized weakness   Lower Extremity Assessment Lower Extremity Assessment: Generalized weakness       Communication Communication Communication: No difficulties   Cognition Arousal/Alertness: Awake/alert, Lethargic Behavior During Therapy: Flat affect Overall Cognitive Status: No family/caregiver present to determine baseline cognitive functioning                                 General Comments: Pt reports feeling that the "end is near for him". He is oriented to self only.     General Comments  VSS on 2L            Home Living Family/patient expects to be discharged to:: Private residence Living Arrangements: Spouse/significant other Available Help at Discharge: Family Type of Home: Mobile home Home Access: Stairs to enter Entrance Stairs-Number of Steps: 7 Entrance Stairs-Rails: Right;Left;Can reach both Home Layout: One level     Bathroom Shower/Tub: Tub/shower unit                    Prior Functioning/Environment Prior Level of Function : Patient poor historian/Family not available                        OT Problem List: Decreased strength;Decreased cognition;Decreased activity tolerance;Decreased safety awareness;Impaired balance (sitting and/or standing);Decreased knowledge of use of DME or AE      OT Treatment/Interventions: Self-care/ADL training;Therapeutic exercise;Therapeutic activities;Energy conservation;DME and/or AE instruction;Manual therapy;Balance training;Patient/family education;Cognitive remediation/compensation    OT Goals(Current goals can be found in the care plan section) Acute Rehab OT Goals Patient Stated Goal: to go home OT Goal Formulation: With patient Time For Goal Achievement: 10/20/22 Potential to Achieve Goals: Fair ADL Goals Pt Will Perform Grooming: with  supervision;standing Pt Will Perform Lower Body Dressing: with min guard assist;sit to/from stand Pt Will Transfer to Toilet: with min guard assist;ambulating Pt Will Perform Toileting - Clothing Manipulation and hygiene: with min guard assist;sit to/from stand  OT Frequency: Min 2X/week       AM-PAC OT "6 Clicks" Daily Activity     Outcome Measure Help from another person eating meals?: A Little Help from another person taking care of personal grooming?: A Little Help from another person toileting, which includes using toliet, bedpan, or urinal?: A Lot Help from another person bathing (including washing, rinsing, drying)?: A Lot Help from another person to put on and taking off regular upper body clothing?: A Lot Help from another person to put on and taking off regular lower body clothing?: A Lot 6 Click Score: 14   End of Session Equipment Utilized During Treatment: Rolling walker (2 wheels) Nurse Communication: Mobility status  Activity Tolerance: Patient limited by fatigue Patient left: in bed;with call bell/phone within reach;with bed alarm set;Other (comment) (fall risk mat placed on floor)  OT Visit Diagnosis: Unsteadiness on feet (R26.81);Repeated falls (R29.6);Muscle weakness (generalized) (M62.81)                Time: 4315-4008 OT Time Calculation (min): 12 min Charges:  OT General Charges $OT Visit: 1 Visit OT Evaluation $OT Eval Moderate Complexity: 1 Mod  Laquinn Shippy, MS, OTR/L , CBIS ascom  [phone]  10/06/22, 3:10 PM

## 2022-10-06 NOTE — Care Management Important Message (Signed)
Important Message  Patient Details  Name: Carlos Levine MRN: [id number] Date of Birth: 1940/12/16   Medicare Important Message Given:  Yes     [name] 10/06/2022, 12:00 PM

## 2022-10-07 ENCOUNTER — Encounter: Payer: Self-pay | Admitting: Internal Medicine

## 2022-10-07 DIAGNOSIS — E871 Hypo-osmolality and hyponatremia: Secondary | ICD-10-CM

## 2022-10-07 DIAGNOSIS — Z515 Encounter for palliative care: Secondary | ICD-10-CM

## 2022-10-07 DIAGNOSIS — I951 Orthostatic hypotension: Secondary | ICD-10-CM | POA: Diagnosis not present

## 2022-10-07 DIAGNOSIS — Z7189 Other specified counseling: Secondary | ICD-10-CM

## 2022-10-07 DIAGNOSIS — I5023 Acute on chronic systolic (congestive) heart failure: Secondary | ICD-10-CM | POA: Diagnosis not present

## 2022-10-07 LAB — BASIC METABOLIC PANEL
Anion gap: 10 (ref 5–15)
BUN: 39 mg/dL — ABNORMAL HIGH (ref 8–23)
CO2: 27 mmol/L (ref 22–32)
Calcium: 8.9 mg/dL (ref 8.9–10.3)
Chloride: 90 mmol/L — ABNORMAL LOW (ref 98–111)
Creatinine, Ser: 1.28 mg/dL — ABNORMAL HIGH (ref 0.61–1.24)
GFR, Estimated: 56 mL/min — ABNORMAL LOW (ref >60)
Glucose, Bld: 132 mg/dL — ABNORMAL HIGH (ref 70–99)
Potassium: 4.7 mmol/L (ref 3.5–5.1)
Sodium: 127 mmol/L — ABNORMAL LOW (ref 135–145)

## 2022-10-07 LAB — GLUCOSE, CAPILLARY
Glucose-Capillary: 105 mg/dL — ABNORMAL HIGH (ref 70–99)
Glucose-Capillary: 146 mg/dL — ABNORMAL HIGH (ref 70–99)
Glucose-Capillary: 119 mg/dL — ABNORMAL HIGH (ref 70–99)
Glucose-Capillary: 159 mg/dL — ABNORMAL HIGH (ref 70–99)
Glucose-Capillary: 136 mg/dL — ABNORMAL HIGH (ref 70–99)

## 2022-10-07 LAB — SODIUM: Sodium: 124 mmol/L — ABNORMAL LOW (ref 135–145)

## 2022-10-07 MED ORDER — ORAL CARE MOUTH RINSE: 15.0000 mL | OROMUCOSAL | Status: DC | PRN

## 2022-10-07 MED ORDER — SODIUM CHLORIDE 0.9 % IV BOLUS
250.0000 mL | Freq: Once | INTRAVENOUS | Status: AC
  Administered 2022-10-07: 250 mL via INTRAVENOUS

## 2022-10-07 MED ORDER — GUAIFENESIN-DM 100-10 MG/5ML PO SYRP
5.0000 mL | ORAL_SOLUTION | ORAL | Status: DC | PRN
  Administered 2022-10-07 (×2): 5 mL via ORAL
  Filled 2022-10-07 (×2): qty 10

## 2022-10-07 MED ORDER — DAPAGLIFLOZIN PROPANEDIOL 10 MG PO TABS
10.0000 mg | ORAL_TABLET | Freq: Every day | ORAL | Status: DC
  Administered 2022-10-07 – 2022-10-10 (×4): 10 mg via ORAL
  Filled 2022-10-07 (×4): qty 1

## 2022-10-07 MED ORDER — ACETAMINOPHEN 325 MG PO TABS
650.0000 mg | ORAL_TABLET | Freq: Once | ORAL | Status: AC
  Administered 2022-10-07: 650 mg via ORAL
  Filled 2022-10-07: qty 2

## 2022-10-07 NOTE — Progress Notes (Addendum)
Progress Note   Patient: Carlos Levine MHD:[id number] DOB: 1941/06/29 DOA: 10/01/2022     6 DOS: the patient was seen and examined on 10/07/2022   Brief hospital course: Mr. Trevell Levine is a 81 year old male with history of hypertension, hyperlipidemia, non-insulin-dependent diabetes mellitus type 2, atrial fibrillation, PVD, CKD 3B, hx of prostate cancer, hx of lung cancer, who presented to ED on 10/01/2022 for evaluation of blistering of his lower extremity, left greater than the lower right, and increased lower extremity swelling despite being started on Lasix recently.  Evaluation in the ED was consistent with decompensated CHF with BNP elevated 1339.2 and exam with tense edema of lower extremities.  Admitted to hospitalist service, started on IV Lasix for diuresis and IV antibiotics for cellulitis associated with open wounds from fluid-filled blisters on L>R feet.  Lower extremity doppler U/S was obtained and ruled out DVT's bilaterally.     Hospital course complicated by orthostatic hypotension, now improving with midodrine.  Hyponatremia worsening.  Poor PO intake.  He remains very weak, SNF recommended, but pt has been declining SNF and HH.  Palliative care following for [name] discussions.  Assessment and Plan: * Acute on chronic HFrEF (heart failure with reduced ejection fraction) ([place]) Recent echo on 09/08/2022 - severe LV systolic dysfunction EF 21%, diffuse hypo-contractility.  Normal right ventricular systolic function.   Mild valvular regurgitation.   Severe aortic and mitral stenosis. 12/15: net I/O -2444, improving renal fx, LE edema improved 12/16: Cr bumped slightly, 1300 cc output yesterday, net I/O -3.2 L. Diuresis held. 12/17: BP's still soft, little better 12/18: resume PO lasix, sodium 129 12/19: sodium 127, appears dry on exam --Stop Lasix for now (see hyponatremia) --Monitor renal function, electrolytes - Strict I's and O's --Low sodium diet --No need to  repeat Echo at this time --Close outpatient [facility] follow up at [facility]  Orthostatic hypotension Pt reports dizziness with upright positioning and ambulation recently.  Orthostatic vitals 12/16 were positive.  Midodrine was started. 12/17 still orthostatic 12/18 orthostatics better today --Increased midodrine to 10 mg TID yesterday, continue  --Daily orthostatic vitals --Fall precautions --He won't tolerate TED hose due to wounds on his feet  Cellulitis of both lower extremities Bilateral foot wounds  Due to fluid-filled blisters from volume overload that had ruptured.  Both lower legs / feet with significant warmth and erythema. 12/15: erythema receding, today only feet, no longer extends up to mid-sin --Initially on Rocephin --Transitioned to cefadroxil 12/16, on last day --Wound care consulted - wound care per instructions  PAF (paroxysmal atrial fibrillation) (HCC) HR is controlled Appears not on any rate control medication Continue Eliquis Telemetry  Stage 3b chronic kidney disease (CKD) ([place]) Renal function at baseline. Monitor closely with diuresis.  Hyponatremia Na was 130 on admission, improved to 134 with diuresis.  Once euvolemic, diuresis held, have not been able to resume home Lasix due to symptomatic hypotension.   12/18: Na 129, slowly trending down past few days 12/19: Na 127 despite home Lasix given and appears dry, ?hypovolemic --Stop Lasix --250 cc NS fluid challenge with repeat Na later today --daily BMP --further evaluation based on response to above  Generalized weakness PT/OT recommend SNF but patient declines this or home health PT/OT. --Fall precautions --May need EMS transport to get into home --4 wheeled walker with seat ordered as home too small for wheelchair --Continue PT/OT and mobility as tolerated  Constipation Bowel regimen with scheduled Miralax daily, Senna-S BID.  Hold if loose or frequent  stools.  Malnutrition of moderate  degree related to chronic illness (CHF) as evidenced by mild fat depletion, moderate fat depletion, mild muscle depletion, moderate muscle depletion.  Appreciate dietitian recommendations. -Liberalize diet to 2 gram sodium -MVI with minerals daily -Ensure Enlive po TID, each supplement provides 350 kcal and 20 grams of protein -500 mg vitamin C BID -220 mg zinc sulfate daily x 14 days  History of prostate cancer Continue bicalutamide.  Sick sinus syndrome ([place]) With pacemaker  Squamous cell lung cancer, right ([place]) No acute issues.  Followed by Dr. Tasia Catchings. Ongoing CT surveillance.  ED (erectile dysfunction) of organic origin .  Pure hypercholesterolemia Continue statin  Type 2 diabetes mellitus with diabetic chronic kidney disease (HCC) Sliding scale Novolog. Hold Actos  Benign essential hypertension Currently with soft BP's on diuresis. - Labetalol IV PRN only for now  Elevated troponin Due to demand ischemia in setting of acute CHF.  No ischemic EKG changes or anginal symptoms.  Hyperkalemia-resolved as of 10/07/2022 K was 5.7 on admission.  On IV Lasix. Today K normal 3.8 from 4.3 Monitor BMP   Patient with multiple comorbidities including multiple cancers, heart failure, now with progressive weakness and debility, orthostatic hypotension.  High risk for re-admission and further morbidity and mortality.  Palliative care consulted for GOC/code status discussions.      Subjective: Pt awake sitting up in recliner this AM.  He reports he actually slept last night.  Feels tired but otherwise okay, "about the same".  PT or OT yesterday afternoon relayed to me he was more agreeable to SNF, but when I ask him this morning he says he wants to go home.  He then insists to show me he can stand up.  He did so without assistance, stood still, steady, said very little dizziness but much better.  He sat back down.    Palliative care saw pt today, this afternoon, having given code  status some thought, patient made decision that if his heart stopped, he would not want CPR/resucitation or going on life support with ventilator.  Code status updated accordingly.   Physical Exam: Vitals:   10/07/22 1148 10/07/22 1220 10/07/22 1615 10/07/22 1627  BP: 114/82  114/87   Pulse: 83  75   Resp: 18  18   Temp:  (!) 97.4 F (36.3 C)  (!) 97.5 F (36.4 C)  TempSrc:  Axillary  Oral  SpO2: 98%  100%   Weight:      Height:       General exam: awake, appears drowsy, no acute distress HEENT: moist mucus membranes, hearing grossly normal  Respiratory system: CTAB but diminished bases, no wheezes, normal respiratory effort, on room air Cardiovascular system: normal S1/S2, RRR, , 2-3+ BLE edema Central nervous system: A&O x 4. no gross focal neurologic deficits, normal speech Extremities: no BLE edema, minimal edema remains in the feet. Skin: dry, poor skin turgor, no peripheral edema (skin wrinkling noted now), clean/dry/intact dressings on feet & distal BLE's, no erythema visible Psychiatry: normal mood, congruent affect, judgement and insight appear normal   Data Reviewed:  Notable labs ---  Na 131 >> 129 (resumed PO Lasix) >> 127 this AM, Cl 90, glucose 132, BUN 39, Cr 1.28 stable    Family Communication: none present, will attempt to call. Updated wife by phone 12/16,18  afternoon Daughter and grandson at bedside on rounds 12/15.  Disposition: Status is: Inpatient Remains inpatient appropriate because: persistent electrolyte derangements with ongoing evaluation, sodium declining.  Planned Discharge Destination: Home (SNF if he agrees).  Has declined home health.       Time spent: 45 minutes  Author: Ezekiel Slocumb, DO 10/07/2022 7:08 PM  For on call review [email or web address].

## 2022-10-07 NOTE — Progress Notes (Addendum)
Mobility Specialist - Progress Note   10/07/22 1100  Mobility  Activity Ambulated with assistance in room;Transferred from chair to bed  Level of Assistance Contact guard assist, steadying assist  Assistive Device Front wheel walker  Distance Ambulated (ft) 20 ft  Activity Response Tolerated well  $Mobility charge 1 Mobility     Pt sitting in recliner upon arrival, utilizing 1L. Pt STS with minG and ambulated 2 x 20' with CGA. No reports of dizziness upon standing. No LOB but does require VC to keep RW close to body especially during turns. Mild SOB, difficult to attain O2 reading but seemingly 87-95% on RA with poor pleth. HR upper 90s-low 100s. Pt more aware of deficits this date and able to accurately report them back to author. Motivated to get better. Pt returned to bed with alarm set, needs in reach. Back on 1L.   Kathee Delton Mobility Specialist 10/07/22, 11:23 AM

## 2022-10-07 NOTE — TOC Progression Note (Signed)
Transition of Care [facility]) - Progression Note    Patient Details  Name: KERMAN PFOST MRN: [id number] Date of Birth: 03-27-1941  Transition of [facility] CM/SW Contact  Laurena Slimmer, RN Phone Number: 10/07/2022, 3:24 PM  Clinical Narrative:    Spoke with patient at his bedside. He has decided he will go to SNF. He was presented with current bed offers for [facility] and [facility]. He would like to discharge to [facility].   Candace Cruise started   Expected Discharge Plan: Home/Self Care Barriers to Discharge: Continued Medical Work up  Expected Discharge Plan and Services Expected Discharge Plan: Home/Self Care   Discharge Planning Services: CM Consult   Living arrangements for the past 2 months: Mobile Home                           HH Arranged: Refused [place]           Social Determinants of Health (SDOH) Interventions    Readmission Risk Interventions     No data to display

## 2022-10-07 NOTE — Consult Note (Signed)
Consultation Note Date: 10/07/2022   Patient Name: Carlos Levine  DOB: Sep 14, 1941  MRN: [id number]  Age / Sex: 81 y.o., male  PCP: Carlos Hector, MD Referring Physician: Ezekiel Slocumb, DO  Reason for Consultation: Establishing goals of care  HPI/Patient Profile: 81 y.o. male  with past medical history of HTN/HLD, NIDDM, A-fib, PVD, CKD 3, history of right lung cancer diagnosed 2020 under surveillance, remote history of prostate cancer, admitted on 10/01/2022 with acute on chronic heart failure with reduced EF, cellulitis of bilateral lower extremity/bilateral foot wounds from blisters.   Clinical Assessment and Goals of Care: I have reviewed medical records including EPIC notes, labs and imaging, received report from RN, assessed the patient.  Carlos Levine is lying quietly in bed.  He appears acutely/chronically ill.  He is alert, oriented to person, place, situation, time.  He can make his basic needs known.  There is no family at bedside at this time.  Nursing staff is present attending to needs.  We meet at the bedside along with to discuss diagnosis prognosis, GOC, EOL wishes, disposition and options.  I introduced Palliative Medicine as specialized medical care for people living with serious illness. It focuses on providing relief from the symptoms and stress of a serious illness. The goal is to improve quality of life for both the patient and the family.  We discussed a brief life review of the patient.  Carlos Levine tells me that he and his wife that he have been married for 66 years.  He shares that they share children Carlos Levine and Carlos Levine.  When asked what kind of work he did he tells me "home [employer]".  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  He shares that he has not done much at all in the last 10 years.  We then focused on their current illness.  I asked Carlos Levine what  brought him to the hospital.  Initially he shares that his daughter drove him here.  I asked what caused him to need hospital stay.  He talks about his diabetes, his kidney disease, his lung cancer and history of prostate cancer.  He does not mention heart failure.  When I bring this up, that he has not mentioned his heart, he seems noncommittal.  We talked about heart failure as a progressive disorder.  We talk about his meeting with the heart failure team.  He tells me that he has lost a lot of weight in the past few months.  He shares that he has not been eating much, "food makes me sick".  I ask if he is having nausea, but he instead states "just to look at it".  I shared that we must eat to live, but he does not respond.  The natural disease trajectory and expectations at EOL were discussed.  We talk about going home versus short-term rehab.  Initially, he states that he would want to return to his own home for the holidays before going  to rehab.  I encouraged him to work closely with transition of care team.  Advanced directives, concepts specific to code status, artifical feeding and hydration, and rehospitalization were considered and discussed.  We talk about the concept of "treat the treatable, but allowing natural passing.  He states that he would want attempted resuscitation.  When asked for how long he considers this and states 2 to 3 weeks would be his limit.  Palliative Care services outpatient were explained and offered.  We talked about the benefits of outpatient palliative services for continued goals of care discussions and CODE STATUS discussions with family members.  He is considering services.  Discussed the importance of continued conversation with family and the medical providers regarding overall plan of care and treatment options, ensuring decisions are within the context of the patient's values and GOCs.  Questions and concerns were addressed.  Hard Choices booklet left for review.  The patient was encouraged to call with questions or concerns.  PMT will continue to support holistically.  Conference with attending, bedside nursing staff, transition of care team related to patient condition, needs, goals of care, disposition.   HCPOA NEXT OF KIN -Carlos Levine shares that he would want his children Carlos Levine and Carlos Levine to make choices if he were unable.    SUMMARY OF RECOMMENDATIONS   Continue to treat the treatable Considering CODE STATUS Considering short-term rehab versus home    Code Status/Advance Care Planning: Full code -states he would want attempted resuscitation.  States no more than 2 to 3 weeks of life support.  We talked about the concept of "treat the treatable, but allowing natural passing".  PMT to continue CODE STATUS discussions.  Symptom Management:  Per hospitalist, no additional needs at this time.  Palliative Prophylaxis:  Frequent Pain Assessment, Oral Care, and Turn Reposition  Additional Recommendations (Limitations, Scope, Preferences): Full Scope Treatment  Psycho-social/Spiritual:  Desire for further Chaplaincy support:no Additional Recommendations: Caregiving  Support/Resources  Prognosis:  Unable to determine, based on outcomes.  1 year or less would not be surprising based on chronic illness burden, decreasing functional status.  Discharge Planning: To be determined, considering home versus short-term rehab.       Primary Diagnoses: Present on Admission:  Acute on chronic HFrEF (heart failure with reduced ejection fraction) (HCC)  Type 2 diabetes mellitus with diabetic chronic kidney disease (HCC)  PAF (paroxysmal atrial fibrillation) (HCC)  Pure hypercholesterolemia  Elevated troponin  Benign essential hypertension  ED (erectile dysfunction) of organic origin  Stage 3b chronic kidney disease (CKD) (HCC)  Cellulitis of both lower extremities  Squamous cell lung cancer, right (HCC)  Hyperkalemia  Malnutrition of moderate  degree  Constipation  Orthostatic hypotension  Hyponatremia  Sick sinus syndrome ([place])   I have reviewed the medical record, interviewed the patient and family, and examined the patient. The following aspects are pertinent.  Past Medical History:  Diagnosis Date   Arthritis    Cancer [facility])    prostate   Chronic kidney disease    Coronary artery disease    Diabetes mellitus without complication ([place])    Dyspnea    with minimal exertion   Dysrhythmia    history of SVT   History of kidney stones    History of radiation therapy    lung   Hyperlipidemia    Hypertension    Peripheral vascular disease ([place])    Prostate cancer ([place])    Squamous cell lung cancer ([place]) 04/2020   also has history  of skin cancer   Social History   Socioeconomic History   Marital status: Married    Spouse name: [name]   Number of children: 4   Years of education: Not on file   Highest education level: Not on file  Occupational History   Occupation: Games developer    Comment: retired  Tobacco Use   Smoking status: Former    Years: 30.00    Types: Cigarettes    Quit date: 10/20/2009    Years since quitting: 12.9   Smokeless tobacco: Never  Vaping Use   Vaping Use: Never used  Substance and Sexual Activity   Alcohol use: No   Drug use: No   Sexual activity: Yes    Birth control/protection: None  Other Topics Concern   Not on file  Social History Narrative   Patient lives with wife and occasionally daughter and granddaughter.   Social Determinants of Health   Financial Resource Strain: Low Risk  (04/13/2019)   Overall Financial Resource Strain (CARDIA)    Difficulty of Paying Living Expenses: Not hard at all  Food Insecurity: No Food Insecurity (10/02/2022)   Hunger Vital Sign    Worried About Running Out of Food in the Last Year: Never true    Ran Out of Food in the Last Year: Never true  Transportation Needs: No Transportation Needs (10/02/2022)   PRAPARE - Radiographer, therapeutic (Medical): No    Lack of Transportation (Non-Medical): No  Physical Activity: Unknown (04/13/2019)   Exercise Vital Sign    Days of Exercise per Week: 0 days    Minutes of Exercise per Session: Not on file  Stress: Stress Concern Present (04/13/2019)   [place]    Feeling of Stress : To some extent  Social Connections: Moderately Isolated (04/13/2019)   Social Connection and Isolation Panel [NHANES]    Frequency of Communication with Friends and Family: More than three times a week    Frequency of Social Gatherings with Friends and Family: Not on file    Attends Religious Services: Never    Marine scientist or Organizations: No    Attends Music therapist: Never    Marital Status: Married   Family History  Problem Relation Age of Onset   Lung cancer Father    Pancreatic cancer Father    Brain cancer Mother    Stomach cancer Paternal Aunt    Stomach cancer Paternal Uncle    Cancer Maternal Grandmother    Kidney cancer Maternal Grandfather    Scheduled Meds:  apixaban  2.5 mg Oral Q12H   vitamin C  500 mg Oral BID   bacitracin   Topical BID   bicalutamide  50 mg Oral Q1200   cefadroxil  500 mg Oral BID   dapagliflozin propanediol  10 mg Oral Daily   feeding supplement  237 mL Oral TID BM   insulin aspart  0-15 Units Subcutaneous TID WC   insulin aspart  0-5 Units Subcutaneous QHS   midodrine  10 mg Oral TID WC   multivitamin with minerals  1 tablet Oral Daily   polyethylene glycol  17 g Oral Daily   pravastatin  80 mg Oral QHS   pregabalin  50 mg Oral BID   senna-docusate  1 tablet Oral BID   zinc sulfate  220 mg Oral Daily   Continuous Infusions: PRN Meds:.bisacodyl, guaiFENesin-dextromethorphan, ipratropium-albuterol, labetalol, melatonin, mouth rinse, traZODone Medications Prior  to Admission:  Prior to Admission medications   Medication Sig Start Date End Date Taking?  Authorizing Provider  apixaban (ELIQUIS) 2.5 MG TABS tablet Take 2.5 mg by mouth every 12 (twelve) hours. 06/04/20 10/01/22 Yes [provider]  bicalutamide (CASODEX) 50 MG tablet Take 1 tablet (50 mg total) by mouth daily at 12 noon. 11/28/21  Yes Stoioff, Ronda Fairly, MD  furosemide (LASIX) 20 MG tablet Take 1 tablet (20 mg total) by mouth daily for 7 days. 06/29/22 10/01/22 Yes Bradler, Vista Lawman, MD  ipratropium-albuterol (DUONEB) 0.5-2.5 (3) MG/3ML SOLN Take 3 mLs by nebulization 2 (two) times daily. 09/24/20  Yes [provider]  pioglitazone (ACTOS) 30 MG tablet Take 30 mg by mouth daily.   Yes [provider]  potassium chloride (KLOR-CON) 10 MEQ tablet Take 10 mEq by mouth daily as needed. Take 1 tablet (10 mEq total) by mouth once daily as needed (with torsemide) 08/11/22 08/11/23 Yes [provider]  pravastatin (PRAVACHOL) 80 MG tablet Take 80 mg by mouth at bedtime.  01/02/19  Yes [provider]  pregabalin (LYRICA) 50 MG capsule Take 50 mg by mouth 2 (two) times daily.   Yes [provider]  torsemide (DEMADEX) 20 MG tablet Take 20 mg by mouth once. 09/26/22 09/26/23 Yes [provider]  valACYclovir (VALTREX) 500 MG tablet Take 500 mg by mouth 3 (three) times daily as needed. 06/13/22  Yes [provider]  Fluticasone-Umeclidin-Vilant (TRELEGY ELLIPTA) 100-62.5-25 MCG/INH AEPB Inhale into the lungs 2 (two) times daily. Patient not taking: Reported on 10/01/2022 09/01/20   [provider]  pioglitazone (ACTOS) 15 MG tablet Take 15 mg by mouth daily. Patient not taking: Reported on 10/01/2022 08/29/19   [provider]   Allergies  Allergen Reactions   Contrast Media [Iodinated Contrast Media] Hives and Rash   Ace Inhibitors Other (See Comments)    Unknown reaction type   Review of Systems  Unable to perform ROS: Age    Physical Exam Vitals and nursing note reviewed.  Constitutional:      General: He  is not in acute distress.    Appearance: He is obese. He is ill-appearing.  Cardiovascular:     Rate and Rhythm: Normal rate.  Pulmonary:     Effort: Pulmonary effort is normal. No respiratory distress.  Abdominal:     Comments:  Obese abdomen  Musculoskeletal:        General: No swelling.  Skin:    General: Skin is warm and dry.  Neurological:     Mental Status: He is alert and oriented to person, place, and time.  Psychiatric:        Mood and Affect: Mood normal.        Behavior: Behavior normal.     Vital Signs: BP 114/82 (BP Location: Left Arm)   Pulse 83   Temp (!) 97.4 F (36.3 C) (Axillary)   Resp 18   Ht 5\' 8"  (1.727 m)   Wt 89 kg   SpO2 98%   BMI 29.83 kg/m  Pain Scale: 0-10   Pain Score: 0-No pain   SpO2: SpO2: 98 % O2 Device:SpO2: 98 % O2 Flow Rate: .O2 Flow Rate (L/min): 1 L/min  IO: Intake/output summary:  Intake/Output Summary (Last 24 hours) at 10/07/2022 1300 Last data filed at 10/07/2022 1127 Gross per 24 hour  Intake 490.13 ml  Output 300 ml  Net 190.13 ml    LBM: Last BM Date : 10/06/22 Baseline Weight: Weight:  83.9 kg Most recent weight: Weight: 89 kg     Palliative Assessment/Data:   Flowsheet Rows    Flowsheet Row Most Recent Value  Intake Tab   Referral Department Hospitalist  Unit at Time of Referral Cardiac/Telemetry Unit  Palliative Care Primary Diagnosis Cardiac  Date Notified 10/05/22  Palliative Care Type New Palliative care  Reason for referral Clarify Goals of Care  Date of Admission 10/01/22  Date first seen by Palliative Care 10/07/22  # of days Palliative referral response time 2 Day(s)  # of days IP prior to Palliative referral 4  Clinical Assessment   Palliative Performance Scale Score 40%  Pain Max last 24 hours Not able to report  Pain Min Last 24 hours Not able to report  Dyspnea Max Last 24 Hours Not able to report  Dyspnea Min Last 24 hours Not able to report  Psychosocial & Spiritual Assessment    Palliative Care Outcomes        Time In: 0930 Time Out: 1045 Time Total: 75 minutes  Greater than 50%  of this time was spent counseling and coordinating care related to the above assessment and plan.  Signed by: Drue Novel, NP   Please contact Palliative Medicine Team phone at [phone] for questions and concerns.  For individual provider: See Shea Evans

## 2022-10-08 DIAGNOSIS — E440 Moderate protein-calorie malnutrition: Secondary | ICD-10-CM | POA: Diagnosis not present

## 2022-10-08 DIAGNOSIS — E871 Hypo-osmolality and hyponatremia: Secondary | ICD-10-CM | POA: Diagnosis not present

## 2022-10-08 DIAGNOSIS — Z515 Encounter for palliative care: Secondary | ICD-10-CM | POA: Diagnosis not present

## 2022-10-08 DIAGNOSIS — I5023 Acute on chronic systolic (congestive) heart failure: Secondary | ICD-10-CM | POA: Diagnosis not present

## 2022-10-08 DIAGNOSIS — R531 Weakness: Secondary | ICD-10-CM | POA: Diagnosis not present

## 2022-10-08 DIAGNOSIS — I951 Orthostatic hypotension: Secondary | ICD-10-CM | POA: Diagnosis not present

## 2022-10-08 DIAGNOSIS — Z7189 Other specified counseling: Secondary | ICD-10-CM | POA: Diagnosis not present

## 2022-10-08 LAB — BLOOD GAS, ARTERIAL
Acid-Base Excess: 6.1 mmol/L — ABNORMAL HIGH (ref 0.0–2.0)
Bicarbonate: 33.3 mmol/L — ABNORMAL HIGH (ref 20.0–28.0)
O2 Saturation: 99.8 %
Patient temperature: 37
pCO2 arterial: 59 mmHg — ABNORMAL HIGH (ref 32–48)
pH, Arterial: 7.36 (ref 7.35–7.45)
pO2, Arterial: 133 mmHg — ABNORMAL HIGH (ref 83–108)

## 2022-10-08 LAB — BASIC METABOLIC PANEL WITH GFR
Anion gap: 8 (ref 5–15)
BUN: 39 mg/dL — ABNORMAL HIGH (ref 8–23)
CO2: 29 mmol/L (ref 22–32)
Calcium: 9 mg/dL (ref 8.9–10.3)
Chloride: 91 mmol/L — ABNORMAL LOW (ref 98–111)
Creatinine, Ser: 1.26 mg/dL — ABNORMAL HIGH (ref 0.61–1.24)
GFR, Estimated: 57 mL/min — ABNORMAL LOW
Glucose, Bld: 119 mg/dL — ABNORMAL HIGH (ref 70–99)
Potassium: 5.4 mmol/L — ABNORMAL HIGH (ref 3.5–5.1)
Sodium: 128 mmol/L — ABNORMAL LOW (ref 135–145)

## 2022-10-08 LAB — GLUCOSE, CAPILLARY
Glucose-Capillary: 145 mg/dL — ABNORMAL HIGH (ref 70–99)
Glucose-Capillary: 110 mg/dL — ABNORMAL HIGH (ref 70–99)
Glucose-Capillary: 116 mg/dL — ABNORMAL HIGH (ref 70–99)
Glucose-Capillary: 111 mg/dL — ABNORMAL HIGH (ref 70–99)
Glucose-Capillary: 139 mg/dL — ABNORMAL HIGH (ref 70–99)

## 2022-10-08 LAB — OSMOLALITY, URINE: Osmolality, Ur: 624 mosm/kg (ref 300–900)

## 2022-10-08 LAB — TSH: TSH: 3.314 u[IU]/mL (ref 0.350–4.500)

## 2022-10-08 LAB — SODIUM, URINE, RANDOM: Sodium, Ur: <10 mmol/L

## 2022-10-08 LAB — AMMONIA: Ammonia: 21 umol/L (ref 9–35)

## 2022-10-08 MED ORDER — FUROSEMIDE 20 MG PO TABS
20.0000 mg | ORAL_TABLET | Freq: Two times a day (BID) | ORAL | Status: DC
  Administered 2022-10-09 – 2022-10-10 (×3): 20 mg via ORAL
  Filled 2022-10-08 (×4): qty 1

## 2022-10-08 NOTE — Progress Notes (Signed)
Palliative: Carlos Levine is sitting up in bed sleeping soundly.  He does not respond to gentle voice or touch.  PMT leaves him to rest returning later in the day.  Return later in the day to visit.  Carlos Levine's lunch tray is in front of him untouched.  He is sleeping soundly and does not respond to gentle voice or touch.  At this point goals are set for "treat the treatable" and short-term rehab.  PMT to follow.  Conference with attending, bedside nursing staff, transition of care team related to patient condition, needs, goals of care, disposition.  Plan: Continue to treat the treatable but no CPR or intubation.  Agreeable to short-term rehab with bed offer at [place].   25 minutes  Quinn Axe, NP Palliative medicine team Team phone [phone] Greater than 50% of this time was spent counseling and coordinating care related to the above assessment and plan.

## 2022-10-08 NOTE — Progress Notes (Signed)
Patient found to be lethargic hard to arouse. V/S 121/68 HR 86 Spo2 100% RA. MD notified. Labs ordered. Patient now talking and responding to commands. . " I hear everything you are saying, I'm just too tired". Daughter now at bedside.

## 2022-10-08 NOTE — Plan of Care (Signed)

## 2022-10-08 NOTE — TOC Progression Note (Signed)
Transition of Care [facility]) - Progression Note    Patient Details  Name: Carlos Levine MRN: [id number] Date of Birth: 07-26-1941  Transition of [facility]) CM/SW Contact  Laurena Slimmer, RN Phone Number: 10/08/2022, 2:28 PM  Clinical Narrative:    Patient authorized from 12/20-12/22 Auth #[id number] Navi# [id number]. MD notified.      Barriers to Discharge: Continued Medical Work up  Expected Discharge Plan and Services   Discharge Planning Services: CM Consult   Living arrangements for the past 2 months: Mobile Home                           HH Arranged: Refused [facility]           Social Determinants of Health (SDOH) Interventions SDOH Screenings   Food Insecurity: No Food Insecurity (10/02/2022)  Housing: Low Risk  (10/02/2022)  Transportation Needs: No Transportation Needs (10/02/2022)  Utilities: Not At Risk (10/02/2022)  Financial Resource Strain: Low Risk  (04/13/2019)  Physical Activity: Unknown (04/13/2019)  Social Connections: Moderately Isolated (04/13/2019)  Stress: Stress Concern Present (04/13/2019)  Tobacco Use: Medium Risk (10/07/2022)    Readmission Risk Interventions     No data to display

## 2022-10-08 NOTE — Progress Notes (Signed)
Nutrition Follow-up  DOCUMENTATION CODES:   Non-severe (moderate) malnutrition in context of chronic illness  INTERVENTION:   -Continue 2 gram sodium diet -Continue MVI with minerals daily -Continue Ensure Enlive po TID, each supplement provides 350 kcal and 20 grams of protein -Continue 500 mg vitamin C BID -Continue 220 mg zinc sulfate daily x 14 days  NUTRITION DIAGNOSIS:   Moderate Malnutrition related to chronic illness (CHF) as evidenced by mild fat depletion, moderate fat depletion, mild muscle depletion, moderate muscle depletion.  Ongoing  GOAL:   Patient will meet greater than or equal to 90% of their needs  Progressing   MONITOR:   PO intake, Supplement acceptance  REASON FOR ASSESSMENT:   Consult Assessment of nutrition requirement/status  ASSESSMENT:   Pt with history of hypertension, hyperlipidemia, non-insulin-dependent diabetes mellitus type 2, atrial fibrillation, PVD, CKD 3B, who presents for chief concerns of blistering of his lower extremity, left greater than the lower right.  Reviewed I/O's: +265 ml x 24 hours and -3.4 L since admission  UOP: 325 ml x 24 hours   Pt with very poor appetite. Noted meal completions 0-25%. Per RN, pt is lethargic. He is drinking Ensure supplements.   Palliative care following and plan to treat the treatable.   Medications reviewed and include farxiga and senokot.   Labs reviewed: Na: 128, K: 5.4, CBGS: 110-136 (inpatient orders for glycemic control are 0-15 units insulin aspart TID with meals and 0-5 units insulin aspart daily at bedtime).    Diet Order:   Diet Order             Diet 2 gram sodium Fluid consistency: Thin  Diet effective now                   EDUCATION NEEDS:   Education needs have been addressed  Skin:  Skin Assessment: Skin Integrity Issues: Skin Integrity Issues:: Other (Comment) Other: ruptured serum filled blisters on bilateral feet  Last BM:  10/07/22 (type 3)  Height:    Ht Readings from Last 1 Encounters:  10/01/22 5\' 8"  (1.727 m)    Weight:   Wt Readings from Last 1 Encounters:  10/08/22 85.6 kg    Ideal Body Weight:  70 kg  BMI:  Body mass index is 28.69 kg/m.  Estimated Nutritional Needs:   Kcal:  1900-2100  Protein:  90-105 grams  Fluid:  > 1.9 L    Loistine Chance, RD, LDN, [place] Registered Dietitian II Certified Diabetes Care and Education Specialist Please refer to [facility] for RD and/or RD on-call/weekend/after hours pager

## 2022-10-08 NOTE — Progress Notes (Signed)
Progress Note   Patient: Carlos Levine JKK:[id number] DOB: 10/13/1941 DOA: 10/01/2022     7 DOS: the patient was seen and examined on 10/08/2022   Brief hospital course: [name] is a 81 year old male with history of hypertension, hyperlipidemia, non-insulin-dependent diabetes mellitus type 2, atrial fibrillation, PVD, CKD 3B, hx of prostate cancer, hx of lung cancer, who presented to ED on 10/01/2022 for evaluation of blistering of his lower extremity, left greater than the lower right, and increased lower extremity swelling despite being started on Lasix recently.  Evaluation in the ED was consistent with decompensated CHF with BNP elevated 1339.2 and exam with tense edema of lower extremities.  Admitted to hospitalist service, started on IV Lasix for diuresis and IV antibiotics for cellulitis associated with open wounds from fluid-filled blisters on L>R feet.  Lower extremity doppler U/S was obtained and ruled out DVT's bilaterally.     Hospital course complicated by orthostatic hypotension, now improving with midodrine.  Hyponatremia worsening.  Poor PO intake.  He remains very weak, SNF recommended, but pt has been declining SNF and HH.  Palliative care following for Carlos Levine discussions.  Assessment and Plan: * Acute on chronic HFrEF (heart failure with reduced ejection fraction) ([place]) Recent echo on 09/08/2022 - severe LV systolic dysfunction EF 71%, diffuse hypo-contractility.  Normal right ventricular systolic function.  Blood pressures have remained soft, limiting ability to diurese as well as with hyponatremia, see below.  Patient with mild valvular regurgitation & severe aortic and mitral stenosis.  Orthostatic hypotension Pt reports dizziness with upright positioning and ambulation recently.  Orthostatic vitals 12/16 were positive.  Midodrine was started. 12/17 still orthostatic 12/18 orthostatics better today --Increased midodrine to 10 mg TID yesterday, continue  --Daily  orthostatic vitals --Fall precautions --He won't tolerate TED hose due to wounds on his feet  Cellulitis of both lower extremities Bilateral foot wounds  Due to fluid-filled blisters from volume overload that had ruptured.  Both lower legs / feet with significant warmth and erythema. Completed antibiotic course --Wound care consulted - wound care per instructions  PAF (paroxysmal atrial fibrillation) (HCC) HR is controlled Appears not on any rate control medication Continue Eliquis Telemetry  Stage 3b chronic kidney disease (CKD) ([place]) Renal function at baseline. Monitor closely with diuresis.  Hyponatremia Na was 130 on admission, improved to 134 with diuresis.  Once euvolemic, diuresis held, have not been able to resume home Lasix due to symptomatic hypotension.  Sodium started to trend back down and patient given fluid challenge on 12/19 causing sodium to drop to 124.  Still feel patient is likely hypervolemic.  Have started low dose p.o. Lasix again  Generalized weakness PT/OT recommend SNF but patient declines this or home health PT/OT. --Fall precautions --May need EMS transport to get into home --4 wheeled walker with seat ordered as home too small for wheelchair --Continue PT/OT and mobility as tolerated  Constipation Bowel regimen with scheduled Miralax daily, Senna-S BID.  Hold if loose or frequent stools.  Malnutrition of moderate degree related to chronic illness (CHF) as evidenced by mild fat depletion, moderate fat depletion, mild muscle depletion, moderate muscle depletion.  Appreciate dietitian recommendations. -Liberalize diet to 2 gram sodium -MVI with minerals daily -Ensure Enlive po TID, each supplement provides 350 kcal and 20 grams of protein -500 mg vitamin C BID -220 mg zinc sulfate daily x 14 days  History of prostate cancer Continue bicalutamide.  Sick sinus syndrome ([place]) With pacemaker  Squamous cell lung cancer,  right ([place]) No acute  issues.  Followed by Dr. Tasia Catchings. Ongoing CT surveillance.  ED (erectile dysfunction) of organic origin .  Pure hypercholesterolemia Continue statin  Type 2 diabetes mellitus with diabetic chronic kidney disease (HCC) Sliding scale Novolog. Hold Actos  Benign essential hypertension Currently with soft BP's on diuresis. - Labetalol IV PRN only for now  Elevated troponin Due to demand ischemia in setting of acute CHF.  No ischemic EKG changes or anginal symptoms.  Hyperkalemia-resolved as of 10/07/2022 K was 5.7 on admission.  On IV Lasix. Today K normal 3.8 from 4.3 Monitor BMP   Patient with multiple comorbidities including multiple cancers, heart failure, now with progressive weakness and debility, orthostatic hypotension.  High risk for re-admission and further morbidity and mortality.  Palliative care consulted for GOC/code status discussions.      Subjective: Patient resting comfortably  Physical Exam: Vitals:   10/07/22 2249 10/08/22 0001 10/08/22 0759 10/08/22 1204  BP: 114/79 112/70 107/82 116/86  Pulse: (!) 41 70 91 86  Resp: 18 18 18 18   Temp: (!) 97.1 F (36.2 C) (!) 97 F (36.1 C) 97.7 F (36.5 C) 97.9 F (36.6 C)  TempSrc: Oral Oral Oral   SpO2: 97% 98% 100% 100%  Weight:   85.6 kg   Height:       General exam: Resting comfortably HEENT: Mucous membranes are moist Respiratory system: Clear to auscultate bilaterally Cardiovascular system: Regular rate and rhythm, S1-S2 Central nervous system: No focal deficits Extremities: 1+ pitting edema bilaterally Skin: dry, poor skin turgor, no peripheral edema (skin wrinkling noted now), clean/dry/intact dressings on feet & distal BLE's, no erythema visible Psychiatry: Appropriate, no evidence of psychoses   Data Reviewed: Sodium at 128, up from 124 last night, creatinine at 1.26  Family Communication: Will call family  Disposition: Status is: Inpatient Remains inpatient appropriate because: Stabilization  of sodium    Planned Discharge Destination: Home (SNF if he agrees).  Has declined home health.       Time spent: 45 minutes  Author: Annita Brod, MD 10/08/2022 2:59 PM  For on call review [email or web address].

## 2022-10-09 DIAGNOSIS — Z7189 Other specified counseling: Secondary | ICD-10-CM | POA: Diagnosis not present

## 2022-10-09 DIAGNOSIS — C3491 Malignant neoplasm of unspecified part of right bronchus or lung: Secondary | ICD-10-CM

## 2022-10-09 DIAGNOSIS — I951 Orthostatic hypotension: Secondary | ICD-10-CM | POA: Diagnosis not present

## 2022-10-09 DIAGNOSIS — I5023 Acute on chronic systolic (congestive) heart failure: Secondary | ICD-10-CM | POA: Diagnosis not present

## 2022-10-09 DIAGNOSIS — E871 Hypo-osmolality and hyponatremia: Secondary | ICD-10-CM | POA: Diagnosis not present

## 2022-10-09 DIAGNOSIS — Z515 Encounter for palliative care: Secondary | ICD-10-CM | POA: Diagnosis not present

## 2022-10-09 DIAGNOSIS — E440 Moderate protein-calorie malnutrition: Secondary | ICD-10-CM | POA: Diagnosis not present

## 2022-10-09 LAB — BASIC METABOLIC PANEL
Anion gap: 7 (ref 5–15)
Anion gap: 12 (ref 5–15)
BUN: 40 mg/dL — ABNORMAL HIGH (ref 8–23)
BUN: 45 mg/dL — ABNORMAL HIGH (ref 8–23)
CO2: 30 mmol/L (ref 22–32)
CO2: 27 mmol/L (ref 22–32)
Calcium: 8.7 mg/dL — ABNORMAL LOW (ref 8.9–10.3)
Calcium: 9.2 mg/dL (ref 8.9–10.3)
Chloride: 91 mmol/L — ABNORMAL LOW (ref 98–111)
Chloride: 89 mmol/L — ABNORMAL LOW (ref 98–111)
Creatinine, Ser: 1.2 mg/dL (ref 0.61–1.24)
Creatinine, Ser: 1.33 mg/dL — ABNORMAL HIGH (ref 0.61–1.24)
GFR, Estimated: >60 mL/min (ref >60)
GFR, Estimated: 53 mL/min — ABNORMAL LOW (ref >60)
Glucose, Bld: 153 mg/dL — ABNORMAL HIGH (ref 70–99)
Glucose, Bld: 192 mg/dL — ABNORMAL HIGH (ref 70–99)
Potassium: 4.9 mmol/L (ref 3.5–5.1)
Potassium: 4.9 mmol/L (ref 3.5–5.1)
Sodium: 128 mmol/L — ABNORMAL LOW (ref 135–145)
Sodium: 128 mmol/L — ABNORMAL LOW (ref 135–145)

## 2022-10-09 LAB — GLUCOSE, CAPILLARY
Glucose-Capillary: 119 mg/dL — ABNORMAL HIGH (ref 70–99)
Glucose-Capillary: 153 mg/dL — ABNORMAL HIGH (ref 70–99)
Glucose-Capillary: 167 mg/dL — ABNORMAL HIGH (ref 70–99)
Glucose-Capillary: 97 mg/dL (ref 70–99)

## 2022-10-09 NOTE — Progress Notes (Signed)
Physical Therapy Treatment Patient Details Name: Carlos Levine MRN: [id number] DOB: Sep 20, 1941 Today's Date: 10/09/2022   History of Present Illness Pt is an 81 year old male with history of hypertension, prostate CA, lung CA, hyperlipidemia, non-insulin-dependent diabetes mellitus type 2, atrial fibrillation, PVD, CKD 3B, who presents emergency department for chief concerns of blistering of his lower extremities, left greater than the lower right.  MD assessment includes: Acute on chronic HFrEF, cellulitis of both lower extremities, elevated troponin due to demand ischemia, and hyperkalemia.    PT Comments    Patient asleep in recliner on arrival but easily wakes to name and agreeable to therapy session. Patient perseverating on "the end is near" throughout session despite attempts to redirect. Able to stand from recliner with minA for [address]. Ambulated 56' with RW and minA for balance and RW management as patient with poor follow through for cues to maintain close proximity. Returned to supine minA. VSS on RA throughout mobility. Continue to recommend SNF for ongoing Physical Therapy.       Recommendations for follow up therapy are one component of a multi-disciplinary discharge planning process, led by the attending physician.  Recommendations may be updated based on patient status, additional functional criteria and insurance authorization.  Follow Up Recommendations  Skilled nursing-short term rehab (<3 hours/day) Can patient physically be transported by private vehicle: No   Assistance Recommended at Discharge Frequent or constant Supervision/Assistance  Patient can return home with the following Two people to help with walking and/or transfers;Two people to help with bathing/dressing/bathroom;Direct supervision/assist for medications management;Assistance with cooking/housework;Help with stairs or ramp for entrance;Assist for transportation   Equipment Recommendations  Rolling  Melda Mermelstein (2 wheels);Wheelchair (measurements PT);Wheelchair cushion (measurements PT)    Recommendations for Other Services       Precautions / Restrictions Precautions Precautions: Fall Restrictions Weight Bearing Restrictions: No     Mobility  Bed Mobility Overal bed mobility: Needs Assistance Bed Mobility: Sit to Supine       Sit to supine: Min assist   General bed mobility comments: In recliner on arrival. assist for B LE management back into bed    Transfers Overall transfer level: Needs assistance Equipment used: Rolling Marni Franzoni (2 wheels) Transfers: Sit to/from Stand Sit to Stand: Min assist           General transfer comment: assist to steady upon standing    Ambulation/Gait Ambulation/Gait assistance: Min assist Gait Distance (Feet): 50 Feet Assistive device: Rolling Katelynne Revak (2 wheels) Gait Pattern/deviations: Step-to pattern, Decreased stride length, Scissoring, Narrow base of support, Trunk flexed Gait velocity: decreased     General Gait Details: Assist for RW management and balance especially during turns. Constant cueing for close RW proximity with poor follow through. Impulsive during turning   Stairs             Wheelchair Mobility    Modified Rankin (Stroke Patients Only)       Balance Overall balance assessment: Needs assistance Sitting-balance support: Feet supported, Bilateral upper extremity supported Sitting balance-Leahy Scale: Good     Standing balance support: Bilateral upper extremity supported, Reliant on assistive device for balance Standing balance-Leahy Scale: Poor                              Cognition Arousal/Alertness: Awake/alert Behavior During Therapy: Flat affect Overall Cognitive Status: No family/caregiver present to determine baseline cognitive functioning  General Comments: reporting "the end is near" throughout session. A&Ox4         Exercises      General Comments General comments (skin integrity, edema, etc.): VSS on RA      Pertinent Vitals/Pain Pain Assessment Pain Assessment: No/denies pain    Home Living                          Prior Function            PT Goals (current goals can now be found in the care plan section) Acute Rehab PT Goals Patient Stated Goal: "the end is near" PT Goal Formulation: Patient unable to participate in goal setting Time For Goal Achievement: 10/16/22 Potential to Achieve Goals: Fair Progress towards PT goals: Progressing toward goals    Frequency    Min 2X/week      PT Plan Current plan remains appropriate    Co-evaluation              AM-PAC PT "6 Clicks" Mobility   Outcome Measure  Help needed turning from your back to your side while in a flat bed without using bedrails?: A Little Help needed moving from lying on your back to sitting on the side of a flat bed without using bedrails?: A Little Help needed moving to and from a bed to a chair (including a wheelchair)?: A Little Help needed standing up from a chair using your arms (e.g., wheelchair or bedside chair)?: A Little Help needed to walk in hospital room?: A Lot Help needed climbing 3-5 steps with a railing? : Total 6 Click Score: 15    End of Session Equipment Utilized During Treatment: Gait belt Activity Tolerance: Patient tolerated treatment well Patient left: in bed;with call bell/phone within reach;with bed alarm set Nurse Communication: Mobility status PT Visit Diagnosis: Difficulty in walking, not elsewhere classified (R26.2);Muscle weakness (generalized) (M62.81)     Time: 4496-7591 PT Time Calculation (min) (ACUTE ONLY): 27 min  Charges:  $Therapeutic Activity: 23-37 mins                     Tiffani Kadow A. Gilford Rile PT, [name] [facility] - Acute Rehabilitation Services    Kamyla Olejnik A Cyncere Sontag 10/09/2022, 4:06 PM

## 2022-10-09 NOTE — Progress Notes (Signed)
Progress Note   Patient: Carlos Levine IOX:[id number] DOB: 04/23/1941 DOA: 10/01/2022     8 DOS: the patient was seen and examined on 10/09/2022   Brief hospital course: [name] is a 81 year old male with history of hypertension, hyperlipidemia, non-insulin-dependent diabetes mellitus type 2, atrial fibrillation, PVD, CKD 3B, hx of prostate cancer, hx of lung cancer, who presented to ED on 10/01/2022 for evaluation of blistering of his lower extremity, left greater than the lower right, and increased lower extremity swelling despite being started on Lasix recently.  Evaluation in the ED was consistent with decompensated CHF with BNP elevated 1339.2 and exam with tense edema of lower extremities.  Admitted to hospitalist service, started on IV Lasix for diuresis and IV antibiotics for cellulitis associated with open wounds from fluid-filled blisters on L>R feet.  Lower extremity doppler U/S was obtained and ruled out DVT's bilaterally.     Hospital course complicated by orthostatic hypotension, now improving with midodrine.  Hyponatremia worsening.  Poor PO intake.  He remains very weak, SNF recommended, but pt has been declining SNF and HH.  Palliative care following for [place] discussions.  Assessment and Plan: * Acute on chronic HFrEF (heart failure with reduced ejection fraction) ([employer]) Recent echo on 09/08/2022 - severe LV systolic dysfunction EF 26%, diffuse hypo-contractility.  Normal right ventricular systolic function.  Blood pressures have remained soft, limiting ability to diurese as well as with hyponatremia, see below.  Patient with mild valvular regurgitation & severe aortic and mitral stenosis.  Should have some mild improvement now that he is tolerating gentle p.o. twice daily Lasix  Orthostatic hypotension Pt reports dizziness with upright positioning and ambulation recently.  Orthostatic vitals 12/16 were positive.  Midodrine was started. 12/17 still orthostatic 12/18  orthostatics better today --Increased midodrine to 10 mg TID yesterday, continue  --Daily orthostatic vitals --Fall precautions --He won't tolerate TED hose due to wounds on his feet, currently tolerating twice daily p.o. low-dose Lasix  Cellulitis of both lower extremities Bilateral foot wounds  Due to fluid-filled blisters from volume overload that had ruptured.  Both lower legs / feet with significant warmth and erythema. Completed antibiotic course --Wound care consulted - wound care per instructions  PAF (paroxysmal atrial fibrillation) (HCC) HR is controlled Appears not on any rate control medication Continue Eliquis Telemetry  Stage 3b chronic kidney disease (CKD) ([place]) Renal function at baseline. Monitor closely with diuresis.  Hyponatremia Na was 130 on admission, improved to 134 with diuresis.  Once euvolemic, diuresis held, have not been able to resume home Lasix due to symptomatic hypotension.  Sodium started to trend back down and patient given fluid challenge on 12/19 causing sodium to drop to 124.  Still feel patient is likely hypervolemic.  Have started low dose p.o. Lasix again and expect sodium to mildly improve  Generalized weakness PT/OT recommend SNF but patient declines this or home health PT/OT. --Fall precautions --May need EMS transport to get into home --4 wheeled walker with seat ordered as home too small for wheelchair --Continue PT/OT and mobility as tolerated  Constipation Bowel regimen with scheduled Miralax daily, Senna-S BID.  Hold if loose or frequent stools.  Malnutrition of moderate degree related to chronic illness (CHF) as evidenced by mild fat depletion, moderate fat depletion, mild muscle depletion, moderate muscle depletion.  Appreciate dietitian recommendations. -Liberalize diet to 2 gram sodium -MVI with minerals daily -Ensure Enlive po TID, each supplement provides 350 kcal and 20 grams of protein -500 mg vitamin  C BID -220 mg zinc  sulfate daily x 14 days  History of prostate cancer Continue bicalutamide.  Sick sinus syndrome ([place]) With pacemaker  Squamous cell lung cancer, right ([place]) No acute issues.  Followed by Dr. Tasia Catchings. Ongoing CT surveillance.  ED (erectile dysfunction) of organic origin .  Pure hypercholesterolemia Continue statin  Type 2 diabetes mellitus with diabetic chronic kidney disease (HCC) Sliding scale Novolog. Hold Actos  Benign essential hypertension Currently with soft BP's on diuresis. - Labetalol IV PRN only for now  Elevated troponin Due to demand ischemia in setting of acute CHF.  No ischemic EKG changes or anginal symptoms.  Hyperkalemia-resolved as of 10/07/2022 K was 5.7 on admission.  On IV Lasix. Today K normal 3.8 from 4.3 Monitor BMP   Patient with multiple comorbidities including multiple cancers, heart failure, now with progressive weakness and debility, orthostatic hypotension.  High risk for re-admission and further morbidity and mortality.  Palliative care consulted for GOC/code status discussions.      Subjective: No complaints, tired, not very hungry  Physical Exam: Vitals:   10/09/22 0556 10/09/22 0740 10/09/22 1013 10/09/22 1140  BP:  (!) 106/93  109/64  Pulse:  89  76  Resp:  20  18  Temp:  97.8 F (36.6 C)  97.8 F (36.6 C)  TempSrc:      SpO2:  96%    Weight: 86.2 kg  86 kg   Height:       General exam: Oriented x 3, fatigued HEENT: Mucous membranes are moist Respiratory system: Clear to auscultate bilaterally Cardiovascular system: Regular rate and rhythm, S1-S2 Central nervous system: No focal deficits Extremities: 1+ pitting edema bilaterally Skin: dry, poor skin turgor, no peripheral edema (skin wrinkling noted now), clean/dry/intact dressings on feet & distal BLE's, no erythema visible Psychiatry: Appropriate, no evidence of psychoses   Data Reviewed: Sodium stable at 128, creatinine improved to 1.20  Family Communication: Will  call family  Disposition: Status is: Inpatient Remains inpatient appropriate because: Stabilization of sodium    Planned Discharge Destination: Home (SNF if he agrees).  Has declined home health.        Author: Annita Brod, MD 10/09/2022 3:32 PM  For on call review [email or web address].

## 2022-10-09 NOTE — TOC Progression Note (Signed)
Transition of Care [facility]) - Progression Note    Patient Details  Name: Carlos Levine MRN: [id number] Date of Birth: Dec 09, 1940  [facility] CM/SW Contact  Laurena Slimmer, RN Phone Number: 10/09/2022, 11:01 AM  Clinical Narrative:    [name] with [name] from [facility]. Patient can be accepted today if medically stable.      Barriers to Discharge: Continued Medical Work up  Expected Discharge Plan and Services   Discharge Planning Services: CM Consult   Living arrangements for the past 2 months: Mobile Home                           HH Arranged: Refused [facility]           Social Determinants of Health (SDOH) Interventions SDOH Screenings   Food Insecurity: No Food Insecurity (10/02/2022)  Housing: Low Risk  (10/02/2022)  Transportation Needs: No Transportation Needs (10/02/2022)  Utilities: Not At Risk (10/02/2022)  Financial Resource Strain: Low Risk  (04/13/2019)  Physical Activity: Unknown (04/13/2019)  Social Connections: Moderately Isolated (04/13/2019)  Stress: Stress Concern Present (04/13/2019)  Tobacco Use: Medium Risk (10/07/2022)    Readmission Risk Interventions     No data to display

## 2022-10-09 NOTE — Progress Notes (Signed)
Palliative: [name] is sitting up in the [place] chair in his room.  He greets me, making and somewhat keeping eye contact.  He appears acutely/chronically ill and frail.  His skin is very thin and he has what looks to be a new skin tear on his left hand.  He is alert and oriented x 3, able to make his needs known.  There is no family at bedside at this time.  I shared that he slept most of the day yesterday, and I ask if he is feeling better.  He tells me that he does not feel better.  We talk about the treatment plan, getting some fluid off.  He asks what day of the week it he is and I share Thursday, that Christmas Eve is Sunday and Christmas Day Monday.  His responses, "I do not know if I will make it".  I ask if he really feels this way and he tells me he dies.  He does tell me, "I am confused".  He shares that he has been seeing things and earlier "hallucinated" seeing his grandson on the floor.  He tells me that he has "talked for 3 days with God today".  I tried to reassure him.  [name] tells me that he has agreed to go to short-term rehab.  I encouraged him to work with people at rehab to the best of his ability.  We also talk about working with staff at rehab for what happens upon discharge.  We talked about the benefits of outpatient palliative services.  He tells me that his wife, Carlos Levine, does not want anyone in the house.  He shares that they live in a single wide trailer each having a bedroom.  He states the house is "so piled up".  He shares that his wife does all the laundry and that he has clothes on his bedroom floor that have not been washed in a year.  [name] states that his son, Carlos Levine, should be arriving for visit tomorrow.  I ask if I can get him anything to eat or drink.  He tells me that he has not been hungry, eating only a few bites of his food tray.  He states he would like some black coffee.  He frequently has general complaints about staff such as taking his food tray with  his drinks on it and the phone not working.  Conference with attending, bedside nursing staff, transition of care team related to patient condition, needs, goals of care, disposition.  Face-to-face conference with bedside nursing staff.  Plan:   Continue to treat the treatable but no CPR or intubation.  Time for outcomes.  Agreeable to short-term rehab would benefit from outpatient palliative services, but declines at this point due to his wife in the condition of home.  72 minutes  Carlos Axe, NP Palliative medicine team Team phone [phone] Greater than 50% of this time was spent counseling and coordinating care related to the above assessment and plan.

## 2022-10-10 DIAGNOSIS — L03115 Cellulitis of right lower limb: Secondary | ICD-10-CM | POA: Diagnosis not present

## 2022-10-10 DIAGNOSIS — Z515 Encounter for palliative care: Secondary | ICD-10-CM | POA: Diagnosis not present

## 2022-10-10 DIAGNOSIS — E871 Hypo-osmolality and hyponatremia: Secondary | ICD-10-CM | POA: Diagnosis not present

## 2022-10-10 DIAGNOSIS — I5023 Acute on chronic systolic (congestive) heart failure: Secondary | ICD-10-CM | POA: Diagnosis not present

## 2022-10-10 DIAGNOSIS — C3491 Malignant neoplasm of unspecified part of right bronchus or lung: Secondary | ICD-10-CM | POA: Diagnosis not present

## 2022-10-10 DIAGNOSIS — Z7189 Other specified counseling: Secondary | ICD-10-CM | POA: Diagnosis not present

## 2022-10-10 DIAGNOSIS — E440 Moderate protein-calorie malnutrition: Secondary | ICD-10-CM | POA: Diagnosis not present

## 2022-10-10 LAB — BASIC METABOLIC PANEL
Anion gap: 14 (ref 5–15)
BUN: 44 mg/dL — ABNORMAL HIGH (ref 8–23)
CO2: 25 mmol/L (ref 22–32)
Calcium: 9.2 mg/dL (ref 8.9–10.3)
Chloride: 93 mmol/L — ABNORMAL LOW (ref 98–111)
Creatinine, Ser: 1.16 mg/dL (ref 0.61–1.24)
GFR, Estimated: >60 mL/min (ref >60)
Glucose, Bld: 122 mg/dL — ABNORMAL HIGH (ref 70–99)
Potassium: 4.9 mmol/L (ref 3.5–5.1)
Sodium: 132 mmol/L — ABNORMAL LOW (ref 135–145)

## 2022-10-10 LAB — GLUCOSE, CAPILLARY
Glucose-Capillary: 120 mg/dL — ABNORMAL HIGH (ref 70–99)
Glucose-Capillary: 157 mg/dL — ABNORMAL HIGH (ref 70–99)
Glucose-Capillary: 151 mg/dL — ABNORMAL HIGH (ref 70–99)

## 2022-10-10 MED ORDER — BISACODYL 5 MG PO TBEC: 5.0000 mg | DELAYED_RELEASE_TABLET | Freq: Every day | ORAL | 0 refills | Status: DC | PRN

## 2022-10-10 MED ORDER — ENSURE ENLIVE PO LIQD: 237.0000 mL | Freq: Three times a day (TID) | ORAL | 12 refills | Status: DC

## 2022-10-10 MED ORDER — PREGABALIN 50 MG PO CAPS: 50.0000 mg | ORAL_CAPSULE | Freq: Two times a day (BID) | ORAL | 0 refills | Status: DC

## 2022-10-10 MED ORDER — POLYETHYLENE GLYCOL 3350 17 G PO PACK: 17.0000 g | PACK | Freq: Every day | ORAL | 0 refills | Status: DC

## 2022-10-10 MED ORDER — MIDODRINE HCL 10 MG PO TABS: 10.0000 mg | ORAL_TABLET | Freq: Three times a day (TID) | ORAL | 1 refills | Status: DC

## 2022-10-10 MED ORDER — TRAZODONE HCL 50 MG PO TABS: 25.0000 mg | ORAL_TABLET | Freq: Every evening | ORAL | 0 refills | Status: DC | PRN

## 2022-10-10 MED ORDER — DAPAGLIFLOZIN PROPANEDIOL 10 MG PO TABS: 10.0000 mg | ORAL_TABLET | Freq: Every day | ORAL | 1 refills | Status: DC

## 2022-10-10 MED ORDER — ASCORBIC ACID 500 MG PO TABS: 500.0000 mg | ORAL_TABLET | Freq: Two times a day (BID) | ORAL | 1 refills | Status: DC

## 2022-10-10 MED ORDER — ZINC SULFATE 220 (50 ZN) MG PO CAPS: 220.0000 mg | ORAL_CAPSULE | Freq: Every day | ORAL | 1 refills | Status: DC

## 2022-10-10 MED ORDER — ADULT MULTIVITAMIN W/MINERALS CH: 1.0000 | ORAL_TABLET | Freq: Every day | ORAL | Status: DC

## 2022-10-10 MED ORDER — BACITRACIN ZINC 500 UNIT/GM EX OINT: TOPICAL_OINTMENT | Freq: Two times a day (BID) | CUTANEOUS | 0 refills | Status: DC

## 2022-10-10 MED ORDER — APIXABAN 2.5 MG PO TABS: 2.5000 mg | ORAL_TABLET | Freq: Two times a day (BID) | ORAL | 3 refills | Status: DC

## 2022-10-10 MED ORDER — FUROSEMIDE 20 MG PO TABS: 20.0000 mg | ORAL_TABLET | Freq: Two times a day (BID) | ORAL | 1 refills | Status: DC

## 2022-10-10 NOTE — Progress Notes (Signed)
Occupational Therapy Treatment Patient Details Name: Carlos Levine MRN: [id number] DOB: 1941/03/16 Today's Date: 10/10/2022   History of present illness Pt is an 81 year old male with history of hypertension, prostate CA, lung CA, hyperlipidemia, non-insulin-dependent diabetes mellitus type 2, atrial fibrillation, PVD, CKD 3B, who presents emergency department for chief concerns of blistering of his lower extremities, left greater than the lower right.  MD assessment includes: Acute on chronic HFrEF, cellulitis of both lower extremities, elevated troponin due to demand ischemia, and hyperkalemia.   OT comments  Upon entering the room, pt seated in recliner chair and requesting to return to bed. Pt stands with min A from recliner chair with use of RW and ambulates 100' with CGA pt needing cues for forward gaze. Pt declines toileting needs. He stands at sink for grooming tasks for ~ 4 minutes with CGA and then returns to bed to rest. Min A for sit >supine for B LEs. Pt does request for therapist to hand him his dentures and he dons 1 denture from cup. Bed alarm activated and all needs within reach.     Recommendations for follow up therapy are one component of a multi-disciplinary discharge planning process, led by the attending physician.  Recommendations may be updated based on patient status, additional functional criteria and insurance authorization.    Follow Up Recommendations  Skilled nursing-short term rehab (<3 hours/day)     Assistance Recommended at Discharge Frequent or constant Supervision/Assistance  Patient can return home with the following  A lot of help with bathing/dressing/bathroom;A lot of help with walking and/or transfers;Assist for transportation;Direct supervision/assist for financial management;Direct supervision/assist for medications management;Help with stairs or ramp for entrance;Assistance with cooking/housework   Equipment Recommendations  Other (comment) (defer  to next venue of care)       Precautions / Restrictions Precautions Precautions: Fall Restrictions Weight Bearing Restrictions: No       Mobility Bed Mobility Overal bed mobility: Needs Assistance Bed Mobility: Sit to Supine       Sit to supine: Min assist        Transfers Overall transfer level: Needs assistance Equipment used: Rolling walker (2 wheels) Transfers: Sit to/from Stand Sit to Stand: Min assist                 Balance Overall balance assessment: Needs assistance Sitting-balance support: Feet supported, Bilateral upper extremity supported Sitting balance-Leahy Scale: Good Sitting balance - Comments: static sitting on EOB Postural control: Posterior lean Standing balance support: Bilateral upper extremity supported, Reliant on assistive device for balance Standing balance-Leahy Scale: Poor Standing balance comment: reliant on UE support                           ADL either performed or assessed with clinical judgement   ADL Overall ADL's : Needs assistance/impaired     Grooming: Wash/dry hands;Wash/dry face;Standing;Min guard                                      Extremity/Trunk Assessment Upper Extremity Assessment Upper Extremity Assessment: Generalized weakness   Lower Extremity Assessment Lower Extremity Assessment: Generalized weakness        Vision Patient Visual Report: No change from baseline            Cognition Arousal/Alertness: Awake/alert Behavior During Therapy: Flat affect Overall Cognitive Status: No family/caregiver present to determine baseline cognitive  functioning                                 General Comments: reporting "the end is near" throughout session. A&Ox4                   Pertinent Vitals/ Pain       Pain Assessment Pain Assessment: No/denies pain         Frequency  Min 2X/week        Progress Toward Goals  OT Goals(current goals can now  be found in the care plan section)  Progress towards OT goals: Progressing toward goals  Acute Rehab OT Goals Patient Stated Goal: to go home OT Goal Formulation: With patient Time For Goal Achievement: 10/20/22 Potential to Achieve Goals: [place] Discharge plan remains appropriate;Frequency remains appropriate       AM-PAC OT "6 Clicks" Daily Activity     Outcome Measure   Help from another person eating meals?: A Little Help from another person taking care of personal grooming?: A Little Help from another person toileting, which includes using toliet, bedpan, or urinal?: A Little Help from another person bathing (including washing, rinsing, drying)?: A Little Help from another person to put on and taking off regular upper body clothing?: A Little Help from another person to put on and taking off regular lower body clothing?: A Lot 6 Click Score: 17    End of Session Equipment Utilized During Treatment: Rolling walker (2 wheels)  OT Visit Diagnosis: Unsteadiness on feet (R26.81);Repeated falls (R29.6);Muscle weakness (generalized) (M62.81)   Activity Tolerance Patient tolerated treatment well   Patient Left in bed;with call bell/phone within reach;with bed alarm set   Nurse Communication Mobility status        Time: 0175-1025 OT Time Calculation (min): 19 min  Charges: OT General Charges $OT Visit: 1 Visit OT Treatments $Therapeutic Activity: 8-22 mins  Darleen Crocker, MS, OTR/L , CBIS ascom [phone]  10/10/22, 1:33 PM

## 2022-10-10 NOTE — TOC Transition Note (Signed)
Transition of Care [facility]) - CM/SW Discharge Note   Patient Details  Name: Carlos Levine MRN: [id number] Date of Birth: 04-30-1941  [facility] of [facility] CM/SW Contact:  Laurena Slimmer, RN Phone Number: 10/10/2022, 1:54 PM   Clinical Narrative:    Spoke with patient at bedside to confirm his choice of [facility] vs [facility]. Patient stated he wanted to go to [facility].   Discharge order received.   Spoke with Tora Kindred in admissions from [facility] to confirm discharge today.   Patient assigned to room 37-A Nurse will call report to [phone] Discharge summary emailed to [email or web address]  Nurse, patient, and spouse notified.  EMS scheduled  Face sheet and medical necessity form printed to floor to be added to floor.   TOC signing off.     Final next level of care: [place] Barriers to Discharge: Barriers Resolved   Patient Goals and CMS Choice      Discharge Placement                Patient chooses bed at: [facility]     Patient and family notified of of transfer: 10/10/22  Discharge Plan and Services Additional resources added to the After Visit Summary for     Discharge Planning Services: CM Consult                      HH Arranged: Refused [facility]          Social Determinants of Health (SDOH) Interventions SDOH Screenings   Food Insecurity: No Food Insecurity (10/02/2022)  Housing: Low Risk  (10/02/2022)  Transportation Needs: No Transportation Needs (10/02/2022)  Utilities: Not At Risk (10/02/2022)  Financial Resource Strain: Low Risk  (04/13/2019)  Physical Activity: Unknown (04/13/2019)  Social Connections: Moderately Isolated (04/13/2019)  Stress: Stress Concern Present (04/13/2019)  Tobacco Use: Medium Risk (10/07/2022)     Readmission Risk Interventions     No data to display

## 2022-10-10 NOTE — Discharge Summary (Addendum)
Addendum: Patient initially scheduled for transport to skilled nursing facility on 12/22.  At approximately 4 PM, patient's daughter arrived and requested that she take the patient home instead.  This was discussed with the patient who stated that he wished to go home as well.  It was explained to the daughter by this attending physician as well as care management and patient's nurse about patient being large fall risk, being two-person assist and patient's daughter said she understood and that there would be several people at the house.  Strongly recommended that someone who is physically capable of helping to catch the patient should he fall and help the patient with mobility be present at least for the next 5 to 7 days 24 hours a day.  Patient's daughter said that she would make that arrangement.  Advised patient's daughter that we will be unable to set up home health until next week and likely someone could not come until after the new year.  Patient's daughter said that would not be necessary as patient and his wife would not want anyone in their home.  Advised the patient's daughter that should any issues arise or questions or need for additional help, to please call the floor number listed on discharge directions and have this hospitalist attending physician paged, as I will be here until 12/28.   Physician Discharge Summary   Patient: Carlos Levine MRN: [id number] DOB: [birth year]  Admit date:     10/01/2022  Discharge date: 10/10/22  Discharge Physician: Annita Brod   PCP: [name], MD   Recommendations at discharge:   Patient initially discharged to short-term skilled nursing New medication: Vitamin C 500 mg p.o. twice daily New medication: Bacitracin ointment applied topically to blisters on legs twice a day New medication: Dulcolax 5 mg p.o. daily as needed for constipation New medication: Farxiga 10 mg p.o. daily Medication change: Lasix restarted at 20 mg p.o. twice  daily.  Recommended patient have basic metabolic panel checked Tuesday, 12/26.  If creatinine is slightly elevated, Lasix can be decreased to 20 mg p.o. daily Medication change: Actos 30 mg p.o. daily discontinued.  Patient's overall p.o. intake has decreased.  If blood sugars are still somewhat elevated, Actos can be restarted at lower dose New medication: Midodrine 10 mg p.o. 3 times daily New medication: Multivitamin p.o. daily New medication: MiraLAX 17 g p.o. daily Medication change: K-Dur discontinue New medication: Trazodone 25 mg p.o. nightly as needed for sleep Medication change: Torsemide discontinued  Discharge Diagnoses: Principal Problem:   Acute on chronic HFrEF (heart failure with reduced ejection fraction) (HCC) Active Problems:   Cellulitis of both lower extremities   Orthostatic hypotension   Stage 3b chronic kidney disease (CKD) (HCC)   PAF (paroxysmal atrial fibrillation) (HCC)   Elevated troponin   Benign essential hypertension   Type 2 diabetes mellitus with diabetic chronic kidney disease (HCC)   Pure hypercholesterolemia   Squamous cell lung cancer, right (HCC)   Sick sinus syndrome ([place])   History of prostate cancer   Malnutrition of moderate degree   Constipation   Generalized weakness   Hyponatremia  Resolved Problems:   Hyperkalemia  Hospital Course: Carlos Levine is a 81 year old male with history of hypertension, hyperlipidemia, non-insulin-dependent diabetes mellitus type 2, atrial fibrillation, PVD, CKD 3B, hx of prostate cancer, hx of lung cancer, who presented to ED on 10/01/2022 for evaluation of blistering of his lower extremity, left greater than the lower right, and increased lower  extremity swelling despite being started on Lasix recently.  Evaluation in the ED was consistent with decompensated CHF with BNP elevated 1339.2 and exam with tense edema of lower extremities.  Admitted to hospitalist service, started on IV Lasix for diuresis and  IV antibiotics for cellulitis associated with open wounds from fluid-filled blisters on L>R feet.  Hospital course complicated by orthostatic hypotension, now improving with midodrine.  Patient also having issues with hyponatremia, which improved with low-dose Lasix.  Patient continues to have poor p.o. intake remains very weak, felt to be a good candidate for skilled nursing.  Palliative care has been following along as well.  Assessment and Plan: * Acute on chronic HFrEF (heart failure with reduced ejection fraction) ([place]) Recent echo on 09/08/2022 - severe LV systolic dysfunction EF 16%, diffuse hypo-contractility.  Normal right ventricular systolic function.  Blood pressures have remained soft, limiting ability to diurese as well as with hyponatremia, see below.  Patient with mild valvular regurgitation & severe aortic and mitral stenosis.  Should have some mild improvement now that he is tolerating gentle p.o. twice daily Lasix  Orthostatic hypotension Pt reports dizziness with upright positioning and ambulation recently.  Orthostatic vitals 12/16 were positive.  Midodrine was started. 12/17 still orthostatic 12/18 orthostatics better today --Increased midodrine to 10 mg TID yesterday, continue  --Daily orthostatic vitals --Fall precautions --He won't tolerate TED hose due to wounds on his feet, currently tolerating twice daily p.o. low-dose Lasix  Cellulitis of both lower extremities Bilateral foot wounds  Due to fluid-filled blisters from volume overload that had ruptured.  Both lower legs / feet with significant warmth and erythema. Completed antibiotic course --Wound care consulted - wound care per instructions  PAF (paroxysmal atrial fibrillation) (HCC) HR is controlled Appears not on any rate control medication Continue Eliquis Telemetry  Stage 3b chronic kidney disease (CKD) ([place]) Renal function at baseline, and in fact, by day of discharge, creatinine 1.16 GFR greater than  60, better than baseline.  Recommending rechecking a basic metabolic panel on 10/96 monitoring renal function. Monitor closely with diuresis.  Hyponatremia Na was 130 on admission, improved to 134 with diuresis.  Once euvolemic, diuresis held, have not been able to resume home Lasix due to symptomatic hypotension.  On Lasix was held, patient started having dilutional hyponatremia and started trending downward.  Lasix has been restarted, sodium continues to improve, 132 on day of discharge.  Generalized weakness PT/OT recommend SNF but patient declines this or home health PT/OT. --Fall precautions --May need EMS transport to get into home --4 wheeled walker with seat ordered as home too small for wheelchair --Continue PT/OT and mobility as tolerated  Constipation Bowel regimen with scheduled Miralax daily, Senna-S BID.  Hold if loose or frequent stools.  Malnutrition of moderate degree related to chronic illness (CHF) as evidenced by mild fat depletion, moderate fat depletion, mild muscle depletion, moderate muscle depletion.  Appreciate dietitian recommendations. -Liberalize diet to 2 gram sodium -MVI with minerals daily -Ensure Enlive po TID, each supplement provides 350 kcal and 20 grams of protein -500 mg vitamin C BID -220 mg zinc sulfate daily x 14 days  History of prostate cancer Continue bicalutamide.  Sick sinus syndrome ([place]) With pacemaker  Squamous cell lung cancer, right ([place]) No acute issues.  Followed by Dr. Tasia Catchings. Ongoing CT surveillance.  ED (erectile dysfunction) of organic origin .  Pure hypercholesterolemia Continue statin  Type 2 diabetes mellitus with diabetic chronic kidney disease (HCC) Sliding scale Novolog. Hold Actos due  to very poor p.o. intake.  Actos being discontinued on discharge and should CBGs start to trend upward, Actos can be resumed starting at 15 mg p.o. daily.  Benign essential hypertension Currently with soft BP's on diuresis. -  Labetalol IV PRN only for now  Elevated troponin Due to demand ischemia in setting of acute CHF.  No ischemic EKG changes or anginal symptoms.  Hyperkalemia-resolved as of 10/07/2022 K was 5.7 on admission.  On IV Lasix. At 4.9 on day of discharge         Consultants: Palliative care Procedures performed: None Disposition: Skilled nursing Diet recommendation:  Discharge Diet Orders (From admission, onward)     Start     Ordered   10/10/22 0000  Diet - low sodium heart healthy        10/10/22 0939           Heart healthy with Ensure shakes p.o. 3 times daily between meals DISCHARGE MEDICATION: Allergies as of 10/10/2022       Reactions   Contrast Media [iodinated Contrast Media] Hives, Rash   Ace Inhibitors Other (See Comments)   Unknown reaction type        Medication List     STOP taking these medications    pioglitazone 30 MG tablet Commonly known as: ACTOS   potassium chloride 10 MEQ tablet Commonly known as: KLOR-CON   torsemide 20 MG tablet Commonly known as: DEMADEX       TAKE these medications    apixaban 2.5 MG Tabs tablet Commonly known as: ELIQUIS Take 1 tablet (2.5 mg total) by mouth every 12 (twelve) hours.   ascorbic acid 500 MG tablet Commonly known as: VITAMIN C Take 1 tablet (500 mg total) by mouth 2 (two) times daily.   bacitracin ointment Apply topically 2 (two) times daily. Apply to blisters on legs   bicalutamide 50 MG tablet Commonly known as: CASODEX Take 1 tablet (50 mg total) by mouth daily at 12 noon.   bisacodyl 5 MG EC tablet Commonly known as: DULCOLAX Take 1 tablet (5 mg total) by mouth daily as needed for moderate constipation.   dapagliflozin propanediol 10 MG Tabs tablet Commonly known as: FARXIGA Take 1 tablet (10 mg total) by mouth daily.   feeding supplement Liqd Take 237 mLs by mouth 3 (three) times daily between meals.   furosemide 20 MG tablet Commonly known as: LASIX Take 1 tablet (20 mg  total) by mouth 2 (two) times daily. What changed: when to take this   ipratropium-albuterol 0.5-2.5 (3) MG/3ML Soln Commonly known as: DUONEB Take 3 mLs by nebulization 2 (two) times daily.   midodrine 10 MG tablet Commonly known as: PROAMATINE Take 1 tablet (10 mg total) by mouth 3 (three) times daily with meals.   multivitamin with minerals Tabs tablet Take 1 tablet by mouth daily.   polyethylene glycol 17 g packet Commonly known as: MIRALAX / GLYCOLAX Take 17 g by mouth daily.   pravastatin 80 MG tablet Commonly known as: PRAVACHOL Take 80 mg by mouth at bedtime.   pregabalin 50 MG capsule Commonly known as: LYRICA Take 1 capsule (50 mg total) by mouth 2 (two) times daily.   traZODone 50 MG tablet Commonly known as: DESYREL Take 0.5 tablets (25 mg total) by mouth at bedtime as needed for sleep.   valACYclovir 500 MG tablet Commonly known as: VALTREX Take 500 mg by mouth 3 (three) times daily as needed.   zinc sulfate 220 (50 Zn) MG capsule  Take 1 capsule (220 mg total) by mouth daily.               Durable Medical Equipment  (From admission, onward)           Start     Ordered   10/04/22 1558  For home use only DME 4 wheeled rolling walker with seat  Once       Question Answer Comment  Patient needs a walker to treat with the following condition Unsteady gait   Patient needs a walker to treat with the following condition Generalized weakness      10/04/22 1557              Discharge Care Instructions  (From admission, onward)           Start     Ordered   10/10/22 0000  Discharge wound care:       Comments: Wound care to left anterior foot with partial-thickness wound of a ruptured blister as well as the right anterior foot: Cleanse wounds with normal saline and pat dry and cover wounds with folded layers of Xeroform gauze and top with an abdominal pad and secure with Kerlix roll gauze applied from just below the toes to just below the  knees and change daily.  Placed feet into Prevalon boots.   10/10/22 0939            Discharge Exam: Filed Weights   10/08/22 0759 10/09/22 0556 10/09/22 1013  Weight: 85.6 kg 86.2 kg 86 kg   General: Alert and oriented x 3, fatigued Cardiovascular: Regular rate and rhythm, S1-S2  Condition at discharge: fair  The results of significant diagnostics from this hospitalization (including imaging, microbiology, ancillary and laboratory) are listed below for reference.   Imaging Studies: US Venous Img Lower Bilateral (DVT)  Result Date: 10/01/2022 CLINICAL DATA:  Leg pain and swelling. EXAM: BILATERAL LOWER EXTREMITY VENOUS DOPPLER ULTRASOUND TECHNIQUE: Gray-scale sonography with compression, as well as color and duplex ultrasound, were performed to evaluate the deep venous system(s) from the level of the common femoral vein through the popliteal and proximal calf veins. COMPARISON:  None Available. FINDINGS: VENOUS Normal compressibility of the common femoral, superficial femoral, and popliteal veins, as well as the visualized calf veins. Visualized portions of profunda femoral vein and great saphenous vein unremarkable. No filling defects to suggest DVT on grayscale or color Doppler imaging. Doppler waveforms show normal direction of venous flow, normal respiratory plasticity and response to augmentation. OTHER Subcutaneous soft tissue edema bilateral calf. Limitations: none IMPRESSION: Negative. Electronically Signed   By: Keane Police D.O.   On: 10/01/2022 22:48   DG Chest 2 View  Result Date: 10/01/2022 CLINICAL DATA:  Leg swelling. EXAM: CHEST - 2 VIEW COMPARISON:  06/29/2022 FINDINGS: Stable significant post treatment changes involving the right hemithorax with loss of volume and changes of radiation fibrosis. Associated right pleural effusion. The left lung remains relatively clear. No pulmonary lesions, overt pulmonary edema or left pleural effusion. IMPRESSION: 1. Stable  significant post treatment changes involving the right hemithorax. 2. The left lung remains relatively clear. No left-sided pleural effusion. Electronically Signed   By: Marijo Sanes M.D.   On: 10/01/2022 11:58    Microbiology: Results for orders placed or performed during the hospital encounter of 07/23/20  SARS CORONAVIRUS 2 (TAT 6-24 HRS) Nasopharyngeal Nasopharyngeal Swab     Status: None   Collection Time: 07/23/20 10:54 AM   Specimen: Nasopharyngeal Swab  Result Value Ref  Range Status   SARS Coronavirus 2 NEGATIVE NEGATIVE Final    Comment: (NOTE) SARS-CoV-2 target nucleic acids are NOT DETECTED.  The SARS-CoV-2 RNA is generally detectable in upper and lower respiratory specimens during the acute phase of infection. Negative results do not preclude SARS-CoV-2 infection, do not rule out co-infections with other pathogens, and should not be used as the sole basis for treatment or other patient management decisions. Negative results must be combined with clinical observations, patient history, and epidemiological information. The expected result is Negative.  Fact Sheet for Patients: SugarRoll.be  Fact Sheet for Healthcare Providers: https://www.woods-mathews.com/  This test is not yet approved or cleared by the [place] FDA and  has been authorized for detection and/or diagnosis of SARS-CoV-2 by FDA under an Emergency Use Authorization (EUA). This EUA will remain  in effect (meaning this test can be used) for the duration of the COVID-19 declaration under Se ction 564(b)(1) of the Act, 21 U.S.C. section 360bbb-3(b)(1), unless the authorization is terminated or revoked sooner.  Performed at [facility], [place] [address], [place], [place] [address]     Labs: CBC: No results for input(s): "WBC", "NEUTROABS", "HGB", "HCT", "MCV", "PLT" in the last 168 hours. Basic Metabolic Panel: Recent Labs  Lab 10/06/22 0337  10/07/22 0337 10/07/22 1904 10/08/22 0727 10/09/22 0358 10/09/22 1453 10/10/22 0615  NA 129* 127* 124* 128* 128* 128* 132*  K 4.6 4.7  --  5.4* 4.9 4.9 4.9  CL 92* 90*  --  91* 91* 89* 93*  CO2 25 27  --  29 30 27 25   GLUCOSE 107* 132*  --  119* 153* 192* 122*  BUN 37* 39*  --  39* 40* 45* 44*  CREATININE 1.29* 1.28*  --  1.26* 1.20 1.33* 1.16  CALCIUM 8.9 8.9  --  9.0 8.7* 9.2 9.2  MG 2.1  --   --   --   --   --   --    Liver Function Tests: No results for input(s): "AST", "ALT", "ALKPHOS", "BILITOT", "PROT", "ALBUMIN" in the last 168 hours. CBG: Recent Labs  Lab 10/09/22 0736 10/09/22 1137 10/09/22 1614 10/09/22 2040 10/10/22 0809  GLUCAP 119* 153* 167* 97 120*    Discharge time spent: less than 30 minutes.  Signed: Annita Brod, MD [facility] 10/10/2022

## 2022-10-10 NOTE — Progress Notes (Signed)
Palliative: [name] is sitting up in the [place] chair in his room.  He greets me making and somewhat keeping eye contact.  He is alert and oriented, able to make his needs known.  There is no family at bedside at this time.  [name] seems encouraged for the first time in several days.  He happily tells me that his blood pressure, oxygen levels and breathing are stable.  We talked about discharge to short-term rehab.  I encouraged him to do the best he can.  We also talk about outpatient palliative services for further goals of care discussions and support.  At this point he is agreeable.  Provider of choice [facility].  Conference with attending, bedside nursing staff, [facility] in-house representative, transition of care team related to patient condition, needs, goals of care, disposition.  Plan: Continue to treat the treatable but no CPR or intubation.  Short-term rehab with [facility].  Outpatient palliative services with [facility]. DNR/goldenrod form completed and placed on chart.  59 minutes Carlos Axe, NP Palliative medicine team Phone [phone] Greater than 50% of this time was spent counseling and coordinating care related to the above assessment and plan.

## 2022-10-10 NOTE — TOC Transition Note (Addendum)
Transition of Care [facility] - CM/SW Discharge Note   Patient Details  Name: Carlos Levine MRN: [id number] Date of Birth: September 20, 1941  Transition of Care [facility]) CM/SW Contact:  Laurena Slimmer, RN Phone Number: 10/10/2022, 4:36 PM   Clinical Narrative:    Spoke with patient's daughter. They have decided patient will discharge home. Patient's daughter stated he has needed DME at home. They have declined to receive WC. Therapy's recommendations for two person assist for transfers reiterated. Patient's daughter stated six people will be in the home to assist patient at all times. She wiill transport patient home.   EMS cancelled Facility notified patient/family decided to go home.   TOC signing off   Final next level of care: [place] Barriers to Discharge: Barriers Resolved   Patient Goals and CMS Choice      Discharge Placement                Patient chooses bed at: [facility]     Patient and family notified of of transfer: 10/10/22  Discharge Plan and Services Additional resources added to the After Visit Summary for     Discharge Planning Services: CM Consult                      HH Arranged: Refused [facility]          Social Determinants of Health (SDOH) Interventions SDOH Screenings   Food Insecurity: No Food Insecurity (10/02/2022)  Housing: Low Risk  (10/02/2022)  Transportation Needs: No Transportation Needs (10/02/2022)  Utilities: Not At Risk (10/02/2022)  Financial Resource Strain: Low Risk  (04/13/2019)  Physical Activity: Unknown (04/13/2019)  Social Connections: Moderately Isolated (04/13/2019)  Stress: Stress Concern Present (04/13/2019)  Tobacco Use: Medium Risk (10/07/2022)     Readmission Risk Interventions     No data to display

## 2022-10-10 NOTE — Care Management Important Message (Signed)
Important Message  Patient Details  Name: Carlos Levine MRN: [id number] Date of Birth: May 29, 1941   Medicare Important Message Given:  Yes     Juliann Pulse A [place] 10/10/2022, 2:59 PM

## 2022-10-10 NOTE — Plan of Care (Signed)

## 2022-10-10 NOTE — Progress Notes (Signed)
[facility]) Hospital Liaison note:  Notified by Lorenza Chick of request for [place] services at [facility]. TOC aware. Will continue to follow for disposition.  Please call with any outpatient palliative questions or concerns.  Thank you for the opportunity to participate in this patient's care.  Thank you, Lorelee Market, LPN [facility] Liaison [phone]

## 2022-10-17 ENCOUNTER — Telehealth: Payer: Self-pay

## 2022-10-17 NOTE — Telephone Encounter (Signed)
TC to patient/spouse to schedule initial PC visit.  Call unsuccessful. SW LVM.   TC to patients daughter Marcie Bal in attempt to schedule PC visit. Daughter requested SW schedule visit with patients spouse.awaiting return TC from spouse.

## 2022-10-21 ENCOUNTER — Telehealth: Payer: Self-pay

## 2022-10-21 NOTE — Progress Notes (Signed)
Patient ID: Carlos Levine, male    DOB: [birth year], 82 y.o.   MRN: [id number]  HPI  Carlos Levine is a 82 y/o male with a history of PAF, prostate cancer, CAD, DM, hyperlipidemia, HTN, CKD, COPD, hyperkalemia, PVD, previous tobacco use and chronic heart failure.   Echo report from 09/08/22 showed an EF of 20% along with mild LAE, mild Carlos and severe AS.   Admitted 10/01/22 due to acute on chronic HF. Given IV lasix and IV antibiotics. Needed midodrine due to hypotension. Elevated troponin thought to be due to demand ischemia. Discharged after 9 days. Was in the ED 06/29/22 due to light-headedness.    He presents today for his initial visit with a chief complaint of moderate shortness of breath with minimal exertion. Describes this as chronic in nature. Has associated chest tightness, fatigue, pedal edema (to knees), palpitations, difficulty sleeping (due to pain) and nonhealing wound on the top of his left foot. Denies any dizziness, abdominal distention, chest pain or cough.   Does not have any scales. Not adding salt to his food but has eaten salisbury steak and fried fish from a restaurant recently. Drinking 6-8 protein shakes which are 8 ounces each (48-64 ounces total) along with some watermelon.   Says that the wound on his left foot hasn't healed and his foot has become quite painful where he can hardly stand to have a sock/ shoe on it. Redness on the top of the foot is worsening. They have been putting bacitracin ointment on it. He says that the wound on his left foot started off as a blister which then popped because of all the swelling. He's now getting blisters on the right foot as well.   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 Gilda Crease   VIDEO BRONCHOSCOPY WITH ENDOBRONCHIAL NAVIGATION N/A 05/30/2019   Procedure: VIDEO BRONCHOSCOPY WITH ENDOBRONCHIAL NAVIGATION, DIABETIC;  Surgeon: Vida Rigger, MD;  Location: [facility] ORS;  Service: Thoracic;  Laterality: N/A;   VIDEO BRONCHOSCOPY WITH ENDOBRONCHIAL NAVIGATION N/A 05/28/2020   Procedure: VIDEO BRONCHOSCOPY WITH ENDOBRONCHIAL NAVIGATION;  Surgeon: Vida Rigger, MD;  Location: [facility] ORS;  Service: Thoracic;  Laterality: N/A;   VIDEO BRONCHOSCOPY WITH  ENDOBRONCHIAL ULTRASOUND N/A 05/30/2019   Procedure: VIDEO BRONCHOSCOPY WITH ENDOBRONCHIAL ULTRASOUND, DIABETIC;  Surgeon: Vida Rigger, MD;  Location: [facility] ORS;  Service: Thoracic;  Laterality: N/A;   VIDEO BRONCHOSCOPY WITH ENDOBRONCHIAL ULTRASOUND N/A 05/28/2020   Procedure: VIDEO BRONCHOSCOPY WITH ENDOBRONCHIAL ULTRASOUND;  Surgeon: Vida Rigger, MD;  Location: [facility] ORS;  Service: Thoracic;  Laterality: N/A;   Family History  Problem Relation Age of Onset   Lung cancer Father    Pancreatic cancer Father    Brain cancer Mother    Stomach cancer Paternal Aunt    Stomach cancer Paternal Uncle    Cancer  Maternal Grandmother    Kidney cancer Maternal Grandfather    Social History   Tobacco Use   Smoking status: Former    Years: 30.00    Types: Cigarettes    Quit date: 10/20/2009    Years since quitting: 13.0   Smokeless tobacco: Never  Substance Use Topics   Alcohol use: No   Allergies  Allergen Reactions   Contrast Media [Iodinated Contrast Media] Hives and Rash   Ace Inhibitors Other (See Comments)    Unknown reaction type   Prior to Admission medications   Medication Sig Start Date End Date Taking? Authorizing Provider  apixaban (ELIQUIS) 2.5 MG TABS tablet Take 1 tablet (2.5 mg total) by mouth every 12 (twelve) hours. 10/10/22  Yes Hollice Espy, MD  ascorbic acid (VITAMIN C) 500 MG tablet Take 1 tablet (500 mg total) by mouth 2 (two) times daily. 10/10/22  Yes Hollice Espy, MD  bacitracin ointment Apply topically 2 (two) times daily. Apply to blisters on legs 10/10/22  Yes Hollice Espy, MD  bicalutamide (CASODEX) 50 MG tablet Take 1 tablet (50 mg total) by mouth daily at 12 noon. 11/28/21  Yes Stoioff, Verna Czech, MD  bisacodyl (DULCOLAX) 5 MG EC tablet Take 1 tablet (5 mg total) by mouth daily as needed for moderate constipation. 10/10/22  Yes Hollice Espy, MD  dapagliflozin propanediol (FARXIGA) 10 MG TABS tablet Take 1 tablet (10 mg total) by mouth daily. 10/10/22  Yes Hollice Espy, MD  feeding supplement (ENSURE ENLIVE / ENSURE PLUS) LIQD Take 237 mLs by mouth 3 (three) times daily between meals. 10/10/22  Yes Hollice Espy, MD  furosemide (LASIX) 20 MG tablet Take 1 tablet (20 mg total) by mouth 2 (two) times daily. 10/10/22  Yes Hollice Espy, MD  ipratropium-albuterol (DUONEB) 0.5-2.5 (3) MG/3ML SOLN Take 3 mLs by nebulization 2 (two) times daily. 09/24/20  Yes [provider]  Multiple Vitamin (MULTIVITAMIN WITH MINERALS) TABS tablet Take 1 tablet by mouth daily. 10/10/22  Yes Hollice Espy, MD  pravastatin (PRAVACHOL) 80 MG  tablet Take 80 mg by mouth at bedtime.  01/02/19  Yes [provider]  pregabalin (LYRICA) 50 MG capsule Take 1 capsule (50 mg total) by mouth 2 (two) times daily. 10/10/22  Yes Hollice Espy, MD  traZODone (DESYREL) 50 MG tablet Take 0.5 tablets (25 mg total) by mouth at bedtime as needed for sleep. 10/10/22  Yes Hollice Espy, MD  zinc sulfate 220 (50 Zn) MG capsule Take 1 capsule (220 mg total) by mouth daily. 10/10/22  Yes Hollice Espy, MD  midodrine (PROAMATINE) 10 MG tablet Take 1 tablet (10 mg total) by mouth 3 (three) times daily with meals. Patient not taking: Reported on 10/22/2022 10/10/22   Hollice Espy, MD  polyethylene glycol (MIRALAX / GLYCOLAX) 17 g packet Take 17 g  by mouth daily. Patient not taking: Reported on 10/22/2022 10/10/22   Hollice Espy, MD  valACYclovir (VALTREX) 500 MG tablet Take 500 mg by mouth 3 (three) times daily as needed. Patient not taking: Reported on 10/22/2022 06/13/22   [provider]   Review of Systems  Constitutional:  Positive for fatigue. Negative for appetite change, chills and fever.  HENT:  Negative for congestion and postnasal drip.   Eyes: Negative.   Respiratory:  Positive for chest tightness (at times) and shortness of breath (easily). Negative for cough and wheezing.   Cardiovascular:  Positive for palpitations and leg swelling. Negative for chest pain.  Gastrointestinal:  Negative for abdominal distention and abdominal pain.  Endocrine: Negative.   Genitourinary: Negative.   Musculoskeletal:  Positive for arthralgias (left foot pain). Negative for neck pain.  Skin:  Positive for color change (redness on top of left foot) and wound (top of left foot).  Allergic/Immunologic: Negative.   Neurological:  Negative for dizziness and light-headedness.  Hematological:  Negative for adenopathy. Does not bruise/bleed easily.  Psychiatric/Behavioral:  Positive for sleep disturbance (not sleeping due to foot  pain). Negative for dysphoric mood. The patient is not nervous/anxious.     Vitals:   10/22/22 1307  BP: 99/66  Pulse: (!) 113  Resp: 16  SpO2: 92%  Weight: 201 lb 8 oz (91.4 kg)   Wt Readings from Last 3 Encounters:  10/22/22 201 lb 8 oz (91.4 kg)  10/10/22 188 lb 0.8 oz (85.3 kg)  06/29/22 191 lb 12.8 oz (87 kg)   Lab Results  Component Value Date   CREATININE 1.16 10/10/2022   CREATININE 1.33 (H) 10/09/2022   CREATININE 1.20 10/09/2022   Physical Exam Vitals and nursing note reviewed. Exam conducted with a chaperone present (daughter).  Constitutional:      Appearance: Normal appearance.  HENT:     Head: Normocephalic and atraumatic.  Cardiovascular:     Rate and Rhythm: Tachycardia present. Rhythm irregular.  Pulmonary:     Effort: Pulmonary effort is normal. No respiratory distress.     Breath sounds: No wheezing or rales.  Abdominal:     General: There is no distension.     Palpations: Abdomen is soft.  Musculoskeletal:        General: Tenderness present.     Cervical back: Normal range of motion and neck supple.     Right lower leg: Edema (1+ pitting) present.     Left lower leg: Edema (2+ pitting) present.  Skin:    General: Skin is warm.     Findings: Erythema (dorsum of left foot) and wound (~ 1.5-2" diameter open wound on top of left foot) present.  Neurological:     General: No focal deficit present.     Mental Status: He is alert and oriented to person, place, and time.  Psychiatric:        Mood and Affect: Mood normal.        Behavior: Behavior normal.   Assessment & Plan:  1: Chronic heart failure with reduced ejection fraction- - NYHA class III - euvolemic - scales provided today and instructed to weigh daily and call for overnight weight gain of >2 pounds or a weekly weight gain of >5 pounds - not adding salt but has had some saltier foods recently like salisbury steak, fried fish and country ham; needs to keep daily sodium intake to  2000mg  - drinking 48-64 ounces of protein shakes daily because he likes the taste  of them; doesn't drink much of anything else - on GDMT of farxiga - has previously needed midodrine d/t hypotension - BNP 10/01/22 was 1339.2  2: HTN with CKD- - BP 99/66 - daughter checks his BP at home and says that it's been around 121/78 so she hasn't given him the midodrine - saw PCP Graciela Husbands) 08/08/22 - BMP 10/10/22 showed sodium 132, potassium 4.9, creatinine 1.16 & GFR > 60  3: DM- - A1c 10/02/22 was 7.1%  4: Paroxysmal atrial fibrillation- - saw cardiology Gwen Pounds) 09/26/22 - has pacemaker for SSS  5: COPD- - saw pulmonology Karna Christmas) 09/03/22 - has nebulizer that he can use  6: Wound- - open wound ~ 1.5-2" in diameter on the dorsum of left foot with surrounding erythema; tender to touch - due to concern of sepsis due to cellulitis, had [facility] volunteer take patient in the wheelchair to the ED; daughter will move the car and pull around to meet him in the ED - patient and daughter both are not surprised with this recommendation as they too, were becoming quite concerned of this wound   Medication list reviewed.   Return here pending ED disposition.

## 2022-10-21 NOTE — Telephone Encounter (Signed)
840 am.  New Palliative Care Referral received for patient.  Phone call made to wife to schedule a home visit.  Wife declines at this time.  She states patient has a visit with his PCP tomorrow and they want to see what is needed before scheduling.  Wife okay with a call back next week for follow up.

## 2022-10-22 ENCOUNTER — Emergency Department: Payer: Medicare Other

## 2022-10-22 ENCOUNTER — Other Ambulatory Visit: Payer: Self-pay

## 2022-10-22 ENCOUNTER — Inpatient Hospital Stay
Admission: EM | Admit: 2022-10-22 | Discharge: 2022-11-08 | DRG: 291 | Disposition: A | Discharge status: Hospice | Payer: Medicare Other | Attending: Internal Medicine | Admitting: Internal Medicine

## 2022-10-22 ENCOUNTER — Encounter: Payer: Self-pay | Admitting: Internal Medicine

## 2022-10-22 ENCOUNTER — Ambulatory Visit (HOSPITAL_BASED_OUTPATIENT_CLINIC_OR_DEPARTMENT_OTHER): Payer: Medicare Other | Admitting: Family

## 2022-10-22 ENCOUNTER — Encounter: Payer: Self-pay | Admitting: Family

## 2022-10-22 VITALS — BP 99/66 | HR 113 | Resp 16 | Wt 201.5 lb

## 2022-10-22 DIAGNOSIS — E872 Acidosis, unspecified: Secondary | ICD-10-CM | POA: Diagnosis present

## 2022-10-22 DIAGNOSIS — N183 Chronic kidney disease, stage 3 unspecified: Secondary | ICD-10-CM

## 2022-10-22 DIAGNOSIS — Z6830 Body mass index (BMI) 30.0-30.9, adult: Secondary | ICD-10-CM

## 2022-10-22 DIAGNOSIS — E11621 Type 2 diabetes mellitus with foot ulcer: Secondary | ICD-10-CM | POA: Diagnosis not present

## 2022-10-22 DIAGNOSIS — N179 Acute kidney failure, unspecified: Secondary | ICD-10-CM | POA: Diagnosis present

## 2022-10-22 DIAGNOSIS — E1151 Type 2 diabetes mellitus with diabetic peripheral angiopathy without gangrene: Secondary | ICD-10-CM | POA: Insufficient documentation

## 2022-10-22 DIAGNOSIS — I480 Paroxysmal atrial fibrillation: Secondary | ICD-10-CM | POA: Diagnosis present

## 2022-10-22 DIAGNOSIS — L97529 Non-pressure chronic ulcer of other part of left foot with unspecified severity: Secondary | ICD-10-CM | POA: Diagnosis present

## 2022-10-22 DIAGNOSIS — Z950 Presence of cardiac pacemaker: Secondary | ICD-10-CM

## 2022-10-22 DIAGNOSIS — J9692 Respiratory failure, unspecified with hypercapnia: Secondary | ICD-10-CM | POA: Diagnosis present

## 2022-10-22 DIAGNOSIS — E1122 Type 2 diabetes mellitus with diabetic chronic kidney disease: Secondary | ICD-10-CM | POA: Insufficient documentation

## 2022-10-22 DIAGNOSIS — N189 Chronic kidney disease, unspecified: Secondary | ICD-10-CM | POA: Insufficient documentation

## 2022-10-22 DIAGNOSIS — D631 Anemia in chronic kidney disease: Secondary | ICD-10-CM | POA: Diagnosis present

## 2022-10-22 DIAGNOSIS — I959 Hypotension, unspecified: Secondary | ICD-10-CM | POA: Insufficient documentation

## 2022-10-22 DIAGNOSIS — E11628 Type 2 diabetes mellitus with other skin complications: Secondary | ICD-10-CM | POA: Diagnosis present

## 2022-10-22 DIAGNOSIS — I2510 Atherosclerotic heart disease of native coronary artery without angina pectoris: Secondary | ICD-10-CM | POA: Insufficient documentation

## 2022-10-22 DIAGNOSIS — I10 Essential (primary) hypertension: Secondary | ICD-10-CM

## 2022-10-22 DIAGNOSIS — L0390 Cellulitis, unspecified: Principal | ICD-10-CM

## 2022-10-22 DIAGNOSIS — Z87442 Personal history of urinary calculi: Secondary | ICD-10-CM

## 2022-10-22 DIAGNOSIS — Z515 Encounter for palliative care: Secondary | ICD-10-CM

## 2022-10-22 DIAGNOSIS — E11649 Type 2 diabetes mellitus with hypoglycemia without coma: Secondary | ICD-10-CM | POA: Diagnosis present

## 2022-10-22 DIAGNOSIS — R0689 Other abnormalities of breathing: Secondary | ICD-10-CM | POA: Diagnosis not present

## 2022-10-22 DIAGNOSIS — Z981 Arthrodesis status: Secondary | ICD-10-CM

## 2022-10-22 DIAGNOSIS — E785 Hyperlipidemia, unspecified: Secondary | ICD-10-CM | POA: Insufficient documentation

## 2022-10-22 DIAGNOSIS — Z8546 Personal history of malignant neoplasm of prostate: Secondary | ICD-10-CM | POA: Insufficient documentation

## 2022-10-22 DIAGNOSIS — I70245 Atherosclerosis of native arteries of left leg with ulceration of other part of foot: Secondary | ICD-10-CM | POA: Diagnosis present

## 2022-10-22 DIAGNOSIS — I495 Sick sinus syndrome: Secondary | ICD-10-CM | POA: Insufficient documentation

## 2022-10-22 DIAGNOSIS — C3491 Malignant neoplasm of unspecified part of right bronchus or lung: Secondary | ICD-10-CM | POA: Diagnosis present

## 2022-10-22 DIAGNOSIS — L97509 Non-pressure chronic ulcer of other part of unspecified foot with unspecified severity: Secondary | ICD-10-CM

## 2022-10-22 DIAGNOSIS — R609 Edema, unspecified: Secondary | ICD-10-CM

## 2022-10-22 DIAGNOSIS — Z923 Personal history of irradiation: Secondary | ICD-10-CM

## 2022-10-22 DIAGNOSIS — I5022 Chronic systolic (congestive) heart failure: Secondary | ICD-10-CM | POA: Insufficient documentation

## 2022-10-22 DIAGNOSIS — N1832 Chronic kidney disease, stage 3b: Secondary | ICD-10-CM | POA: Diagnosis present

## 2022-10-22 DIAGNOSIS — E669 Obesity, unspecified: Secondary | ICD-10-CM | POA: Diagnosis present

## 2022-10-22 DIAGNOSIS — E43 Unspecified severe protein-calorie malnutrition: Secondary | ICD-10-CM | POA: Insufficient documentation

## 2022-10-22 DIAGNOSIS — I130 Hypertensive heart and chronic kidney disease with heart failure and stage 1 through stage 4 chronic kidney disease, or unspecified chronic kidney disease: Principal | ICD-10-CM | POA: Diagnosis present

## 2022-10-22 DIAGNOSIS — I70235 Atherosclerosis of native arteries of right leg with ulceration of other part of foot: Secondary | ICD-10-CM | POA: Diagnosis present

## 2022-10-22 DIAGNOSIS — I420 Dilated cardiomyopathy: Secondary | ICD-10-CM | POA: Diagnosis present

## 2022-10-22 DIAGNOSIS — Z89021 Acquired absence of right finger(s): Secondary | ICD-10-CM

## 2022-10-22 DIAGNOSIS — J449 Chronic obstructive pulmonary disease, unspecified: Secondary | ICD-10-CM | POA: Insufficient documentation

## 2022-10-22 DIAGNOSIS — L03119 Cellulitis of unspecified part of limb: Secondary | ICD-10-CM | POA: Diagnosis not present

## 2022-10-22 DIAGNOSIS — I5020 Unspecified systolic (congestive) heart failure: Secondary | ICD-10-CM | POA: Insufficient documentation

## 2022-10-22 DIAGNOSIS — Z85118 Personal history of other malignant neoplasm of bronchus and lung: Secondary | ICD-10-CM

## 2022-10-22 DIAGNOSIS — I5023 Acute on chronic systolic (congestive) heart failure: Secondary | ICD-10-CM | POA: Diagnosis present

## 2022-10-22 DIAGNOSIS — M1990 Unspecified osteoarthritis, unspecified site: Secondary | ICD-10-CM | POA: Diagnosis present

## 2022-10-22 DIAGNOSIS — G9341 Metabolic encephalopathy: Secondary | ICD-10-CM | POA: Diagnosis not present

## 2022-10-22 DIAGNOSIS — Z85828 Personal history of other malignant neoplasm of skin: Secondary | ICD-10-CM

## 2022-10-22 DIAGNOSIS — Z87891 Personal history of nicotine dependence: Secondary | ICD-10-CM

## 2022-10-22 DIAGNOSIS — D649 Anemia, unspecified: Secondary | ICD-10-CM | POA: Diagnosis present

## 2022-10-22 DIAGNOSIS — Z79891 Long term (current) use of opiate analgesic: Secondary | ICD-10-CM

## 2022-10-22 DIAGNOSIS — Z7901 Long term (current) use of anticoagulants: Secondary | ICD-10-CM

## 2022-10-22 DIAGNOSIS — S90821A Blister (nonthermal), right foot, initial encounter: Secondary | ICD-10-CM | POA: Diagnosis present

## 2022-10-22 DIAGNOSIS — L03115 Cellulitis of right lower limb: Secondary | ICD-10-CM | POA: Diagnosis present

## 2022-10-22 DIAGNOSIS — Z79899 Other long term (current) drug therapy: Secondary | ICD-10-CM

## 2022-10-22 DIAGNOSIS — I080 Rheumatic disorders of both mitral and aortic valves: Secondary | ICD-10-CM | POA: Diagnosis present

## 2022-10-22 DIAGNOSIS — Z91041 Radiographic dye allergy status: Secondary | ICD-10-CM

## 2022-10-22 DIAGNOSIS — L03116 Cellulitis of left lower limb: Secondary | ICD-10-CM | POA: Diagnosis present

## 2022-10-22 DIAGNOSIS — E7800 Pure hypercholesterolemia, unspecified: Secondary | ICD-10-CM | POA: Diagnosis present

## 2022-10-22 DIAGNOSIS — L97519 Non-pressure chronic ulcer of other part of right foot with unspecified severity: Secondary | ICD-10-CM | POA: Diagnosis present

## 2022-10-22 DIAGNOSIS — Z888 Allergy status to other drugs, medicaments and biological substances status: Secondary | ICD-10-CM

## 2022-10-22 DIAGNOSIS — E1140 Type 2 diabetes mellitus with diabetic neuropathy, unspecified: Secondary | ICD-10-CM | POA: Diagnosis present

## 2022-10-22 DIAGNOSIS — Z66 Do not resuscitate: Secondary | ICD-10-CM | POA: Diagnosis present

## 2022-10-22 DIAGNOSIS — E870 Hyperosmolality and hypernatremia: Secondary | ICD-10-CM | POA: Diagnosis present

## 2022-10-22 DIAGNOSIS — Z801 Family history of malignant neoplasm of trachea, bronchus and lung: Secondary | ICD-10-CM

## 2022-10-22 LAB — URINALYSIS, ROUTINE W REFLEX MICROSCOPIC
Bilirubin Urine: NEGATIVE
Glucose, UA: 150 mg/dL — AB
Hgb urine dipstick: NEGATIVE
Ketones, ur: NEGATIVE mg/dL
Leukocytes,Ua: NEGATIVE
Nitrite: NEGATIVE
Protein, ur: NEGATIVE mg/dL
Specific Gravity, Urine: 1.012 (ref 1.005–1.030)
pH: 5 (ref 5.0–8.0)

## 2022-10-22 LAB — COMPREHENSIVE METABOLIC PANEL
ALT: 32 U/L (ref 0–44)
AST: 38 U/L (ref 15–41)
Albumin: 3.4 g/dL — ABNORMAL LOW (ref 3.5–5.0)
Alkaline Phosphatase: 57 U/L (ref 38–126)
Anion gap: 11 (ref 5–15)
BUN: 76 mg/dL — ABNORMAL HIGH (ref 8–23)
CO2: 30 mmol/L (ref 22–32)
Calcium: 8.7 mg/dL — ABNORMAL LOW (ref 8.9–10.3)
Chloride: 97 mmol/L — ABNORMAL LOW (ref 98–111)
Creatinine, Ser: 1.41 mg/dL — ABNORMAL HIGH (ref 0.61–1.24)
GFR, Estimated: 49 mL/min — ABNORMAL LOW (ref >60)
Glucose, Bld: 184 mg/dL — ABNORMAL HIGH (ref 70–99)
Potassium: 4.4 mmol/L (ref 3.5–5.1)
Sodium: 138 mmol/L (ref 135–145)
Total Bilirubin: 1.1 mg/dL (ref 0.3–1.2)
Total Protein: 6.7 g/dL (ref 6.5–8.1)

## 2022-10-22 LAB — CBC WITH DIFFERENTIAL/PLATELET
Abs Immature Granulocytes: 0.02 10*3/uL (ref 0.00–0.07)
Basophils Absolute: 0 10*3/uL (ref 0.0–0.1)
Basophils Relative: 0 %
Eosinophils Absolute: 0 10*3/uL (ref 0.0–0.5)
Eosinophils Relative: 0 %
HCT: 36.7 % — ABNORMAL LOW (ref 39.0–52.0)
Hemoglobin: 11.3 g/dL — ABNORMAL LOW (ref 13.0–17.0)
Immature Granulocytes: 0 %
Lymphocytes Relative: 13 %
Lymphs Abs: 0.9 10*3/uL (ref 0.7–4.0)
MCH: 31 pg (ref 26.0–34.0)
MCHC: 30.8 g/dL (ref 30.0–36.0)
MCV: 100.5 fL — ABNORMAL HIGH (ref 80.0–100.0)
Monocytes Absolute: 0.8 10*3/uL (ref 0.1–1.0)
Monocytes Relative: 12 %
Neutro Abs: 5 10*3/uL (ref 1.7–7.7)
Neutrophils Relative %: 75 %
Platelets: 183 10*3/uL (ref 150–400)
RBC: 3.65 MIL/uL — ABNORMAL LOW (ref 4.22–5.81)
RDW: 17.2 % — ABNORMAL HIGH (ref 11.5–15.5)
WBC: 6.7 10*3/uL (ref 4.0–10.5)
nRBC: 0 % (ref 0.0–0.2)

## 2022-10-22 LAB — LACTIC ACID, PLASMA: Lactic Acid, Venous: 1.4 mmol/L (ref 0.5–1.9)

## 2022-10-22 MED ORDER — VANCOMYCIN HCL 2000 MG/400ML IV SOLN
2000.0000 mg | Freq: Once | INTRAVENOUS | Status: AC
  Administered 2022-10-22: 2000 mg via INTRAVENOUS
  Filled 2022-10-22: qty 400

## 2022-10-22 MED ORDER — SODIUM CHLORIDE 0.9 % IV SOLN
2.0000 g | Freq: Once | INTRAVENOUS | Status: AC
  Administered 2022-10-22: 2 g via INTRAVENOUS
  Filled 2022-10-22: qty 20

## 2022-10-22 MED ORDER — FUROSEMIDE 10 MG/ML IJ SOLN
40.0000 mg | Freq: Once | INTRAMUSCULAR | Status: AC
  Administered 2022-10-22: 40 mg via INTRAVENOUS
  Filled 2022-10-22: qty 4

## 2022-10-22 NOTE — Patient Instructions (Addendum)
Continue weighing daily and call for an overnight weight gain of 3 pounds or more or a weekly weight gain of more than 5 pounds.   If you have voicemail, please make sure your mailbox is cleaned out so that we may leave a message and please make sure to listen to any voicemails.    We will schedule a follow-up appointment once you are released.

## 2022-10-22 NOTE — H&P (Signed)
History and Physical    Patient: Carlos Levine JKD:[id number] DOB: [birth year] DOA: 10/22/2022 DOS: the patient was seen and examined on 10/22/2022 PCP: [name], MD  Patient coming from: Home  Chief Complaint:  Chief Complaint  Patient presents with   Foot Swelling   admission   HPI: Carlos Levine is a 82 y.o. male with medical history significant of hypertension, hyperlipidemia, Dm2 with CKD stage 3, sick sinus syndrome, chronic CHFrEF, Pafib, Elevated troponin, hx of prostate cancer w recent admission for cellulitis apparently presents due to worsening foot swelling and redness.  Pt was told by cardiologist to come to ED.  Note that patient declined rehab during recent admission and went home.  No medication change since discharge other than couldn't get midodrine at the pharmacy.   Note prior ultrasound 10/01/2022 during recent admission negative for DVT  In ED, T 97.4, P 78 Bp 106/63  pox 97% on RA  CXR FINDINGS: The cardiac silhouette is mildly enlarged and unchanged in size. There is marked severity calcification of the thoracic aorta. There is stable opacification of the mid right lung and right lung base with subsequent right-sided volume loss. Mild, stable right upper lobe scarring and/or airspace disease is also seen. Chronic appearing increased lung markings are seen throughout the left lung. A stable moderate sized right-sided pleural effusion is noted. No pneumothorax is identified. Multilevel degenerative changes are seen throughout the thoracic spine.   IMPRESSION: 1. Stable post treatment changes throughout the right lung with subsequent right-sided volume loss.  Lactic acid 1.4 Na 138, K 4.4, Bun 76, Creatinine 1.41 Alb 3.4 Ast 38, Alt 32 Hco3 30, AG 11 Glucose 184  Wbc 6.7, hgb 11.3, plt 183  Urinalysis negative  Pt received vanco iv x1  and rocephin 2gm iv x1 in the ED, pt will be admitted for cellulitis of the left > right foot, and ulcer of  the dorsum of the left foot.  Along with pedal edema.    Review of Systems: negative for all 10 organ systems except for + above Past Medical History:  Diagnosis Date   Arrhythmia    PAF   Arthritis    Cancer ([place])    prostate   CHF (congestive heart failure) (HCC)    Chronic kidney disease    COPD (chronic obstructive pulmonary disease) (HCC)    Coronary artery disease    Diabetes mellitus without complication ([place])    Dyspnea    with minimal exertion   Dysrhythmia    history of SVT   History of kidney stones    History of radiation therapy    lung   Hyperkalemia 11/19/2017   Hyperlipidemia    Hypertension    Peripheral vascular disease (HCC)    Prostate cancer (HCC)    Squamous cell lung cancer ([place]) 04/2020   also has history of skin cancer   Past Surgical History:  Procedure Laterality Date   AMPUTATION FINGER / THUMB Right    BACK SURGERY  1975   removed 2 discs and fused others   PACEMAKER LEADLESS INSERTION N/A 07/25/2020   Procedure: PACEMAKER LEADLESS INSERTION;  Surgeon: Isaias Cowman, MD;  Location: [facility];  Service: Cardiovascular;  Laterality: N/A;   PROSTATE CRYOABLATION     SPINAL FUSION     STENT PLACEMENT ILIAC ([place] HX) Left 2014   Left common iliac and left external iliac by Dr. Delana Meyer   VIDEO BRONCHOSCOPY WITH ENDOBRONCHIAL NAVIGATION N/A 05/30/2019  Procedure: VIDEO BRONCHOSCOPY WITH ENDOBRONCHIAL NAVIGATION, DIABETIC;  Surgeon: Ottie Glazier, MD;  Location: [facility];  Service: Thoracic;  Laterality: N/A;   VIDEO BRONCHOSCOPY WITH ENDOBRONCHIAL NAVIGATION N/A 05/28/2020   Procedure: VIDEO BRONCHOSCOPY WITH ENDOBRONCHIAL NAVIGATION;  Surgeon: Ottie Glazier, MD;  Location: [facility];  Service: Thoracic;  Laterality: N/A;   VIDEO BRONCHOSCOPY WITH ENDOBRONCHIAL ULTRASOUND N/A 05/30/2019   Procedure: VIDEO BRONCHOSCOPY WITH ENDOBRONCHIAL ULTRASOUND, DIABETIC;  Surgeon: Ottie Glazier, MD;  Location: [facility];  Service: Thoracic;   Laterality: N/A;   VIDEO BRONCHOSCOPY WITH ENDOBRONCHIAL ULTRASOUND N/A 05/28/2020   Procedure: VIDEO BRONCHOSCOPY WITH ENDOBRONCHIAL ULTRASOUND;  Surgeon: Ottie Glazier, MD;  Location: [facility];  Service: Thoracic;  Laterality: N/A;   Social History:  reports that he quit smoking about 13 years ago. His smoking use included cigarettes. He has never used smokeless tobacco. He reports that he does not drink alcohol and does not use drugs.  Allergies  Allergen Reactions   Contrast Media [Iodinated Contrast Media] Hives and Rash   Ace Inhibitors Other (See Comments)    Unknown reaction type    Family History  Problem Relation Age of Onset   Lung cancer Father    Pancreatic cancer Father    Brain cancer Mother    Stomach cancer Paternal Aunt    Stomach cancer Paternal Uncle    Cancer Maternal Grandmother    Kidney cancer Maternal Grandfather     Prior to Admission medications   Medication Sig Start Date End Date Taking? Authorizing Provider  apixaban (ELIQUIS) 2.5 MG TABS tablet Take 1 tablet (2.5 mg total) by mouth every 12 (twelve) hours. 10/10/22  Yes Annita Brod, MD  ascorbic acid (VITAMIN C) 500 MG tablet Take 1 tablet (500 mg total) by mouth 2 (two) times daily. 10/10/22  Yes Annita Brod, MD  bacitracin ointment Apply topically 2 (two) times daily. Apply to blisters on legs 10/10/22  Yes Annita Brod, MD  bicalutamide (CASODEX) 50 MG tablet Take 1 tablet (50 mg total) by mouth daily at 12 noon. 11/28/21  Yes Stoioff, Ronda Fairly, MD  dapagliflozin propanediol (FARXIGA) 10 MG TABS tablet Take 1 tablet (10 mg total) by mouth daily. 10/10/22  Yes Annita Brod, MD  feeding supplement (ENSURE ENLIVE / ENSURE PLUS) LIQD Take 237 mLs by mouth 3 (three) times daily between meals. 10/10/22  Yes Annita Brod, MD  ipratropium-albuterol (DUONEB) 0.5-2.5 (3) MG/3ML SOLN Take 3 mLs by nebulization 2 (two) times daily. 09/24/20  Yes [provider]  Multiple  Vitamin (MULTIVITAMIN WITH MINERALS) TABS tablet Take 1 tablet by mouth daily. 10/10/22  Yes Annita Brod, MD  pravastatin (PRAVACHOL) 80 MG tablet Take 80 mg by mouth at bedtime.  01/02/19  Yes [provider]  pregabalin (LYRICA) 50 MG capsule Take 1 capsule (50 mg total) by mouth 2 (two) times daily. 10/10/22  Yes Annita Brod, MD  traZODone (DESYREL) 50 MG tablet Take 0.5 tablets (25 mg total) by mouth at bedtime as needed for sleep. 10/10/22  Yes Annita Brod, MD  zinc sulfate 220 (50 Zn) MG capsule Take 1 capsule (220 mg total) by mouth daily. 10/10/22  Yes Annita Brod, MD  bisacodyl (DULCOLAX) 5 MG EC tablet Take 1 tablet (5 mg total) by mouth daily as needed for moderate constipation. 10/10/22   Annita Brod, MD  furosemide (LASIX) 20 MG tablet Take 1 tablet (20 mg total) by mouth 2 (two) times daily. 10/10/22   [place] Pink,  Trenton Gammon, MD  midodrine (PROAMATINE) 10 MG tablet Take 1 tablet (10 mg total) by mouth 3 (three) times daily with meals. Patient not taking: Reported on 10/22/2022 10/10/22   Annita Brod, MD  polyethylene glycol (MIRALAX / GLYCOLAX) 17 g packet Take 17 g by mouth daily. Patient not taking: Reported on 10/22/2022 10/10/22   Annita Brod, MD  valACYclovir (VALTREX) 500 MG tablet Take 500 mg by mouth 3 (three) times daily as needed. Patient not taking: Reported on 10/22/2022 06/13/22   [provider]    Physical Exam: Vitals:   10/22/22 1900 10/22/22 1930 10/22/22 2045 10/22/22 2115  BP: 100/71   105/68  Pulse:  100 98 98  Resp:  16  (!) 22  Temp:      TempSrc:      SpO2:  97%  99%  Weight:      Height:       Heent: anicteric Neck: no jvd Heart: rrr s1, s2, no m/g/r Lung: decrease in bs at the right lung base, no crackles, no wheezing Abd: soft, obese, nt, nd, +bs Ext: no c/c,  + 1+ edema, negative homans Skin: 4cm oval ulcer on the dorsum of the left foot, and 0.8cm ulcer on the dorsum of the 2nd and 3rd  toes.  Redness over the dorum of the left foot,  slight blister 1.5cm on the right foot, and healed blister on dorsum of the right foot.  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 Cont Duoneb bid  DVT prophylaxis: cont Eliquis DNR/ DNI Dispo: home vs SNF  Pt will require inpatient > 2 nites admission to improved edema and tx cellulitis.      Advance Care Planning:   Code Status: Prior DNR/DNI  Consults: none  Family Communication: w daughter  Severity of Illness: The appropriate patient status for this patient is INPATIENT. Inpatient status is judged to be reasonable and necessary in order to provide the required intensity of service to ensure the patient's safety. The patient's presenting symptoms, physical exam findings, and initial radiographic and laboratory data in the context of their chronic comorbidities is felt to place them at high risk for further clinical deterioration. Furthermore, it is not anticipated that the patient will be medically stable for discharge from the hospital within 2 midnights of admission.   * I certify that at the point of admission it is my clinical judgment that the patient will require inpatient hospital care spanning beyond 2 midnights from the point of admission due to high intensity of service, high risk for further deterioration and high frequency of surveillance required.*  Author: JameJani Gravel 10/22/2022 10:58 PM  For on call review [email or web address]

## 2022-10-22 NOTE — ED Notes (Signed)
Patient provided with diet coke per request

## 2022-10-22 NOTE — Progress Notes (Signed)
[facility] ED waiting room [facility]) Hospital Liaison note:  This is a pending outpatient-based Palliative Care patient. Will continue to follow for disposition.  Please call with any outpatient palliative questions or concerns.  Thank you, Lorelee Market, LPN [facility] Liaison [phone]

## 2022-10-22 NOTE — ED Provider Triage Note (Signed)
Emergency Medicine Provider Triage Evaluation Note  Carlos Levine , a 82 y.o. male  was evaluated in triage.  Pt complains of wound on left foot not healing. Was hospitalized for same recently and seems to be getting worse.    Review of Systems  Positive: Swelling left foot Negative: No fever or chills per patient  Physical Exam  BP 104/74 (BP Location: Left Arm)   Pulse (!) 59   Temp 97.7 F (36.5 C) (Oral)   Resp 16   Ht 5\' 8"  (1.727 m)   Wt 91.4 kg   SpO2 95%   BMI 30.64 kg/m  Gen:   Awake, no distress   Resp:  Normal effort  MSK:   Dorsum left foot with open wound and erythema surrounding.  Edematous tissue.  No active drainage. Other:    Medical Decision Making  Medically screening exam initiated at 2:06 PM.  Appropriate orders placed.  EPHREM CARRICK was informed that the remainder of the evaluation will be completed by another provider, this initial triage assessment does not replace that evaluation, and the importance of remaining in the ED until their evaluation is complete.     Johnn Hai, PA-C 10/22/22 1410

## 2022-10-22 NOTE — ED Provider Notes (Signed)
[facility] Provider Note    Event Date/Time   First MD Initiated Contact with Patient 10/22/22 1739     (approximate)   History   Foot Swelling and admission   HPI  Carlos Levine is a 82 y.o. male  with pmh HFrEF, COPD, DM, CAD who presents because of concern for foot infection.  Patient notes increasing pain and swelling to the top of the left foot.  He was admitted at the end of December for cellulitis and lower extremity swelling.  He has chronic heart failure EF about 20%.  He was diuresed and treated with IV antibiotics and ultimately was discharged.  His family members at bedside notes that the foot seems to be getting more red and it is more painful.  Also had increasing confusion over the last several days.  Patient denies any increase in dyspnea.  Says he has been elevating the legs.  He is compliant with his medication.  Denies fevers or chills.     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   also has history of skin cancer    Patient Active Problem List   Diagnosis Date Noted   Hyponatremia 10/06/2022   Orthostatic hypotension 10/05/2022   Generalized weakness 10/05/2022   Malnutrition of moderate degree 10/03/2022   Constipation 10/03/2022   Cellulitis of both lower extremities 10/02/2022   Acute on chronic HFrEF (heart failure with reduced ejection fraction) ([place]) 10/01/2022    Drug-induced gynecomastia 04/03/2022   PAF (paroxysmal atrial fibrillation) ([place]) 04/03/2022   Loss of weight 12/18/2021   History of prostate cancer 12/18/2021   Sick sinus syndrome ([place]) 07/25/2020   SOB (shortness of breath) 05/26/2019   SVT (supraventricular tachycardia) 05/26/2019   Squamous cell lung cancer, right ([place]) 04/20/2019   Localized enlarged lymph nodes 03/29/2019   Goals of care, counseling/discussion 03/29/2019   Chronic bilateral low back pain without sciatica 02/21/2018   Chronic constipation 11/19/2017   Elevated troponin 06/11/2017   Dizziness 06/24/2016   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   Sore of lower lip 05/13/2015   Benign essential hypertension 02/02/2015   Type 2 diabetes mellitus with diabetic chronic kidney disease ([place]) 02/02/2015   Pure hypercholesterolemia 02/02/2015   Lumbar degenerative disc disease 02/02/2015   Stage 3b chronic kidney disease (CKD) ([place]) 05/12/2014   Malignant neoplasm of prostate ([place]) 07/14/2012   Hypertrophy of breast 07/14/2012   ED (erectile dysfunction) of organic origin 07/14/2012     Physical Exam  Triage Vital Signs: ED Triage Vitals  Enc Vitals Group     BP 10/22/22 1404 104/74     Pulse Rate 10/22/22 1404 (!) 59     Resp 10/22/22 1404 16     Temp 10/22/22 1404 97.7 F (36.5 C)  Temp Source 10/22/22 1404 Oral     SpO2 10/22/22 1404 95 %     Weight 10/22/22 1404 201 lb 8 oz (91.4 kg)     Height 10/22/22 1404 5\' 8"  (1.727 m)     Head Circumference --      Peak Flow --      Pain Score 10/22/22 1403 8     Pain Loc --      Pain Edu? --      Excl. in [place]? --     Most recent vital signs: Vitals:   10/22/22 1404 10/22/22 1849  BP: 104/74 108/78  Pulse: (!) 59 99  Resp: 16 18  Temp: 97.7 F (36.5 C) (!) 97.4 F (36.3 C)  SpO2: 95% 95%     General: Awake, no distress.  Chronically ill-appearing, dyspneic CV:  Good peripheral perfusion.  2+ pitting edema in the bilateral lower  extremities Resp:  Normal effort.  Increased work of breathing with moving around the bed, crackles Abd:  No distention.  Neuro:             Awake, Alert, Oriented x 3  Other:  Erythema on the dorsum of the foot with a scab on the dorsum of the foot that is tender to palpation, toes are erythematous with some scabbing as well, no purulent drainage, no odor, no crepitus   ED Results / Procedures / Treatments  Labs (all labs ordered are listed, but only abnormal results are displayed) Labs Reviewed  COMPREHENSIVE METABOLIC PANEL - Abnormal; Notable for the following components:      Result Value   Chloride 97 (*)    Glucose, Bld 184 (*)    BUN 76 (*)    Creatinine, Ser 1.41 (*)    Calcium 8.7 (*)    Albumin 3.4 (*)    GFR, Estimated 49 (*)    All other components within normal limits  CBC WITH DIFFERENTIAL/PLATELET - Abnormal; Notable for the following components:   RBC 3.65 (*)    Hemoglobin 11.3 (*)    HCT 36.7 (*)    MCV 100.5 (*)    RDW 17.2 (*)    All other components within normal limits  LACTIC ACID, PLASMA  URINALYSIS, ROUTINE W REFLEX MICROSCOPIC     EKG     RADIOLOGY    PROCEDURES:  Critical Care performed: No  Procedures  The patient is on the cardiac monitor to evaluate for evidence of arrhythmia and/or significant heart rate changes.   MEDICATIONS ORDERED IN ED: Medications - No data to display   IMPRESSION / MDM / [place] / ED COURSE  I reviewed the triage vital signs and the nursing notes.                              Patient's presentation is most consistent with acute complicated illness / injury requiring diagnostic workup.  Differential diagnosis includes, but is not limited to, cellulitis, venous stasis, heart failure exacerbation, contact dermatitis  The patient is a 82 year old male with past medical history of significant heart failure who presents because of worsening wound on the top of the left foot.  He was admitted  at the end of December for CHF with wound on the foot was treated with IV antibiotics.  He was also diuresed and was discharged.       FINAL CLINICAL IMPRESSION(S) / ED DIAGNOSES   Final diagnoses:  None  Rx / DC Orders   ED Discharge Orders     None        Note:  This document was prepared using Dragon voice recognition software and may include unintentional dictation errors.

## 2022-10-22 NOTE — ED Triage Notes (Signed)
Left foot wound, discharged from hospital just before christmas.  Sent to ED for readmission.

## 2022-10-23 ENCOUNTER — Inpatient Hospital Stay: Payer: Medicare Other

## 2022-10-23 DIAGNOSIS — L03116 Cellulitis of left lower limb: Secondary | ICD-10-CM | POA: Diagnosis present

## 2022-10-23 DIAGNOSIS — N1832 Chronic kidney disease, stage 3b: Secondary | ICD-10-CM | POA: Diagnosis present

## 2022-10-23 DIAGNOSIS — I080 Rheumatic disorders of both mitral and aortic valves: Secondary | ICD-10-CM | POA: Diagnosis present

## 2022-10-23 DIAGNOSIS — E11649 Type 2 diabetes mellitus with hypoglycemia without coma: Secondary | ICD-10-CM | POA: Diagnosis present

## 2022-10-23 DIAGNOSIS — J9692 Respiratory failure, unspecified with hypercapnia: Secondary | ICD-10-CM | POA: Diagnosis present

## 2022-10-23 DIAGNOSIS — L03115 Cellulitis of right lower limb: Secondary | ICD-10-CM | POA: Diagnosis present

## 2022-10-23 DIAGNOSIS — E1151 Type 2 diabetes mellitus with diabetic peripheral angiopathy without gangrene: Secondary | ICD-10-CM | POA: Diagnosis present

## 2022-10-23 DIAGNOSIS — C3491 Malignant neoplasm of unspecified part of right bronchus or lung: Secondary | ICD-10-CM | POA: Diagnosis not present

## 2022-10-23 DIAGNOSIS — Z515 Encounter for palliative care: Secondary | ICD-10-CM | POA: Diagnosis not present

## 2022-10-23 DIAGNOSIS — I70245 Atherosclerosis of native arteries of left leg with ulceration of other part of foot: Secondary | ICD-10-CM | POA: Diagnosis present

## 2022-10-23 DIAGNOSIS — J449 Chronic obstructive pulmonary disease, unspecified: Secondary | ICD-10-CM | POA: Diagnosis present

## 2022-10-23 DIAGNOSIS — I739 Peripheral vascular disease, unspecified: Secondary | ICD-10-CM | POA: Diagnosis not present

## 2022-10-23 DIAGNOSIS — L03119 Cellulitis of unspecified part of limb: Secondary | ICD-10-CM | POA: Diagnosis not present

## 2022-10-23 DIAGNOSIS — I130 Hypertensive heart and chronic kidney disease with heart failure and stage 1 through stage 4 chronic kidney disease, or unspecified chronic kidney disease: Secondary | ICD-10-CM | POA: Diagnosis present

## 2022-10-23 DIAGNOSIS — D631 Anemia in chronic kidney disease: Secondary | ICD-10-CM | POA: Diagnosis present

## 2022-10-23 DIAGNOSIS — I70239 Atherosclerosis of native arteries of right leg with ulceration of unspecified site: Secondary | ICD-10-CM | POA: Diagnosis not present

## 2022-10-23 DIAGNOSIS — Z7189 Other specified counseling: Secondary | ICD-10-CM | POA: Diagnosis not present

## 2022-10-23 DIAGNOSIS — I5020 Unspecified systolic (congestive) heart failure: Secondary | ICD-10-CM | POA: Insufficient documentation

## 2022-10-23 DIAGNOSIS — E43 Unspecified severe protein-calorie malnutrition: Secondary | ICD-10-CM | POA: Diagnosis not present

## 2022-10-23 DIAGNOSIS — I420 Dilated cardiomyopathy: Secondary | ICD-10-CM | POA: Diagnosis present

## 2022-10-23 DIAGNOSIS — E1140 Type 2 diabetes mellitus with diabetic neuropathy, unspecified: Secondary | ICD-10-CM | POA: Diagnosis present

## 2022-10-23 DIAGNOSIS — I5023 Acute on chronic systolic (congestive) heart failure: Secondary | ICD-10-CM | POA: Diagnosis present

## 2022-10-23 DIAGNOSIS — I70249 Atherosclerosis of native arteries of left leg with ulceration of unspecified site: Secondary | ICD-10-CM | POA: Diagnosis not present

## 2022-10-23 DIAGNOSIS — I480 Paroxysmal atrial fibrillation: Secondary | ICD-10-CM | POA: Diagnosis present

## 2022-10-23 DIAGNOSIS — E870 Hyperosmolality and hypernatremia: Secondary | ICD-10-CM | POA: Diagnosis present

## 2022-10-23 DIAGNOSIS — Z66 Do not resuscitate: Secondary | ICD-10-CM | POA: Diagnosis present

## 2022-10-23 DIAGNOSIS — L97919 Non-pressure chronic ulcer of unspecified part of right lower leg with unspecified severity: Secondary | ICD-10-CM | POA: Diagnosis not present

## 2022-10-23 DIAGNOSIS — L0390 Cellulitis, unspecified: Secondary | ICD-10-CM | POA: Diagnosis present

## 2022-10-23 DIAGNOSIS — I495 Sick sinus syndrome: Secondary | ICD-10-CM | POA: Diagnosis present

## 2022-10-23 DIAGNOSIS — L97929 Non-pressure chronic ulcer of unspecified part of left lower leg with unspecified severity: Secondary | ICD-10-CM | POA: Diagnosis not present

## 2022-10-23 DIAGNOSIS — E872 Acidosis, unspecified: Secondary | ICD-10-CM | POA: Diagnosis present

## 2022-10-23 DIAGNOSIS — E669 Obesity, unspecified: Secondary | ICD-10-CM | POA: Diagnosis present

## 2022-10-23 DIAGNOSIS — E1122 Type 2 diabetes mellitus with diabetic chronic kidney disease: Secondary | ICD-10-CM | POA: Diagnosis present

## 2022-10-23 DIAGNOSIS — I10 Essential (primary) hypertension: Secondary | ICD-10-CM | POA: Diagnosis not present

## 2022-10-23 DIAGNOSIS — N179 Acute kidney failure, unspecified: Secondary | ICD-10-CM | POA: Diagnosis present

## 2022-10-23 DIAGNOSIS — G9341 Metabolic encephalopathy: Secondary | ICD-10-CM | POA: Diagnosis not present

## 2022-10-23 DIAGNOSIS — R609 Edema, unspecified: Secondary | ICD-10-CM | POA: Diagnosis not present

## 2022-10-23 DIAGNOSIS — E11628 Type 2 diabetes mellitus with other skin complications: Secondary | ICD-10-CM | POA: Diagnosis not present

## 2022-10-23 LAB — CBG MONITORING, ED
Glucose-Capillary: 105 mg/dL — ABNORMAL HIGH (ref 70–99)
Glucose-Capillary: 115 mg/dL — ABNORMAL HIGH (ref 70–99)
Glucose-Capillary: 141 mg/dL — ABNORMAL HIGH (ref 70–99)
Glucose-Capillary: 186 mg/dL — ABNORMAL HIGH (ref 70–99)
Glucose-Capillary: 235 mg/dL — ABNORMAL HIGH (ref 70–99)

## 2022-10-23 LAB — CBC
HCT: 37.1 % — ABNORMAL LOW (ref 39.0–52.0)
Hemoglobin: 11.3 g/dL — ABNORMAL LOW (ref 13.0–17.0)
MCH: 30.8 pg (ref 26.0–34.0)
MCHC: 30.5 g/dL (ref 30.0–36.0)
MCV: 101.1 fL — ABNORMAL HIGH (ref 80.0–100.0)
Platelets: 170 10*3/uL (ref 150–400)
RBC: 3.67 MIL/uL — ABNORMAL LOW (ref 4.22–5.81)
RDW: 17.3 % — ABNORMAL HIGH (ref 11.5–15.5)
WBC: 7.5 10*3/uL (ref 4.0–10.5)
nRBC: 0 % (ref 0.0–0.2)

## 2022-10-23 LAB — COMPREHENSIVE METABOLIC PANEL
ALT: 31 U/L (ref 0–44)
AST: 34 U/L (ref 15–41)
Albumin: 3.2 g/dL — ABNORMAL LOW (ref 3.5–5.0)
Alkaline Phosphatase: 54 U/L (ref 38–126)
Anion gap: 10 (ref 5–15)
BUN: 72 mg/dL — ABNORMAL HIGH (ref 8–23)
CO2: 31 mmol/L (ref 22–32)
Calcium: 8.4 mg/dL — ABNORMAL LOW (ref 8.9–10.3)
Chloride: 98 mmol/L (ref 98–111)
Creatinine, Ser: 1.44 mg/dL — ABNORMAL HIGH (ref 0.61–1.24)
GFR, Estimated: 48 mL/min — ABNORMAL LOW (ref >60)
Glucose, Bld: 131 mg/dL — ABNORMAL HIGH (ref 70–99)
Potassium: 4.6 mmol/L (ref 3.5–5.1)
Sodium: 139 mmol/L (ref 135–145)
Total Bilirubin: 1 mg/dL (ref 0.3–1.2)
Total Protein: 6.6 g/dL (ref 6.5–8.1)

## 2022-10-23 LAB — SEDIMENTATION RATE: Sed Rate: 43 mm/hr — ABNORMAL HIGH (ref 0–20)

## 2022-10-23 LAB — BRAIN NATRIURETIC PEPTIDE: B Natriuretic Peptide: 1426.2 pg/mL — ABNORMAL HIGH (ref 0.0–100.0)

## 2022-10-23 LAB — C-REACTIVE PROTEIN: CRP: 0.7 mg/dL (ref <1.0)

## 2022-10-23 MED ORDER — DAPAGLIFLOZIN PROPANEDIOL 10 MG PO TABS
10.0000 mg | ORAL_TABLET | Freq: Every day | ORAL | Status: DC
  Administered 2022-10-23: 10 mg via ORAL
  Filled 2022-10-23: qty 1

## 2022-10-23 MED ORDER — FUROSEMIDE 10 MG/ML IJ SOLN: 40.0000 mg | Freq: Every day | INTRAMUSCULAR | Status: DC

## 2022-10-23 MED ORDER — INSULIN ASPART 100 UNIT/ML IJ SOLN
0.0000 [IU] | Freq: Three times a day (TID) | INTRAMUSCULAR | Status: DC
  Administered 2022-10-23 – 2022-10-25 (×3): 1 [IU] via SUBCUTANEOUS
  Administered 2022-10-25: 2 [IU] via SUBCUTANEOUS
  Filled 2022-10-23 (×4): qty 1

## 2022-10-23 MED ORDER — APIXABAN 2.5 MG PO TABS
2.5000 mg | ORAL_TABLET | Freq: Two times a day (BID) | ORAL | Status: DC
  Administered 2022-10-23 – 2022-10-25 (×6): 2.5 mg via ORAL
  Filled 2022-10-23 (×7): qty 1

## 2022-10-23 MED ORDER — TRAZODONE HCL 50 MG PO TABS
25.0000 mg | ORAL_TABLET | Freq: Every evening | ORAL | Status: DC | PRN
  Administered 2022-10-24 – 2022-10-27 (×4): 25 mg via ORAL
  Filled 2022-10-23 (×4): qty 1

## 2022-10-23 MED ORDER — ACETAMINOPHEN 325 MG PO TABS
650.0000 mg | ORAL_TABLET | Freq: Four times a day (QID) | ORAL | Status: DC | PRN
  Administered 2022-10-26 – 2022-10-27 (×3): 650 mg via ORAL
  Filled 2022-10-23 (×3): qty 2

## 2022-10-23 MED ORDER — IPRATROPIUM-ALBUTEROL 0.5-2.5 (3) MG/3ML IN SOLN
3.0000 mL | Freq: Two times a day (BID) | RESPIRATORY_TRACT | Status: DC
  Administered 2022-10-23 – 2022-10-24 (×3): 3 mL via RESPIRATORY_TRACT
  Filled 2022-10-23 (×3): qty 3

## 2022-10-23 MED ORDER — ACETAMINOPHEN 650 MG RE SUPP: 650.0000 mg | Freq: Four times a day (QID) | RECTAL | Status: DC | PRN

## 2022-10-23 MED ORDER — PRAVASTATIN SODIUM 20 MG PO TABS
80.0000 mg | ORAL_TABLET | Freq: Every day | ORAL | Status: DC
  Administered 2022-10-23 – 2022-11-05 (×10): 80 mg via ORAL
  Filled 2022-10-23 (×6): qty 4
  Filled 2022-10-23: qty 2
  Filled 2022-10-23 (×3): qty 4

## 2022-10-23 MED ORDER — FUROSEMIDE 10 MG/ML IJ SOLN: 40.0000 mg | Freq: Two times a day (BID) | INTRAMUSCULAR | Status: DC

## 2022-10-23 MED ORDER — SODIUM CHLORIDE 0.9 % IV SOLN
2.0000 g | INTRAVENOUS | Status: DC
  Administered 2022-10-23 – 2022-10-25 (×3): 2 g via INTRAVENOUS
  Filled 2022-10-23: qty 20
  Filled 2022-10-23: qty 2
  Filled 2022-10-23 (×2): qty 20

## 2022-10-23 MED ORDER — SODIUM CHLORIDE 0.9 % IV SOLN: 250.0000 mL | INTRAVENOUS | Status: DC | PRN

## 2022-10-23 MED ORDER — METRONIDAZOLE 500 MG/100ML IV SOLN
500.0000 mg | Freq: Two times a day (BID) | INTRAVENOUS | Status: DC
  Administered 2022-10-23 – 2022-10-27 (×8): 500 mg via INTRAVENOUS
  Filled 2022-10-23 (×8): qty 100

## 2022-10-23 MED ORDER — SODIUM CHLORIDE 0.9% FLUSH
3.0000 mL | Freq: Two times a day (BID) | INTRAVENOUS | Status: DC
  Administered 2022-10-23 – 2022-10-25 (×2): 3 mL via INTRAVENOUS

## 2022-10-23 MED ORDER — VANCOMYCIN HCL 750 MG/150ML IV SOLN
750.0000 mg | INTRAVENOUS | Status: DC
  Administered 2022-10-24 – 2022-10-28 (×5): 750 mg via INTRAVENOUS
  Filled 2022-10-23 (×5): qty 150

## 2022-10-23 MED ORDER — METOPROLOL SUCCINATE ER 25 MG PO TB24
12.5000 mg | ORAL_TABLET | Freq: Every day | ORAL | Status: DC
  Administered 2022-10-23: 12.5 mg via ORAL
  Filled 2022-10-23: qty 1

## 2022-10-23 MED ORDER — PREGABALIN 50 MG PO CAPS
50.0000 mg | ORAL_CAPSULE | Freq: Two times a day (BID) | ORAL | Status: DC
  Administered 2022-10-23 – 2022-10-30 (×14): 50 mg via ORAL
  Filled 2022-10-23 (×14): qty 1

## 2022-10-23 MED ORDER — SODIUM CHLORIDE 0.9% FLUSH: 3.0000 mL | INTRAVENOUS | Status: DC | PRN

## 2022-10-23 MED ORDER — FUROSEMIDE 10 MG/ML IJ SOLN
40.0000 mg | Freq: Every day | INTRAMUSCULAR | Status: DC
  Administered 2022-10-23: 40 mg via INTRAVENOUS
  Filled 2022-10-23: qty 4

## 2022-10-23 MED ORDER — INSULIN ASPART 100 UNIT/ML IJ SOLN
0.0000 [IU] | Freq: Every day | INTRAMUSCULAR | Status: DC
  Administered 2022-10-23: 2 [IU] via SUBCUTANEOUS
  Administered 2022-10-25: 3 [IU] via SUBCUTANEOUS
  Filled 2022-10-23 (×2): qty 1

## 2022-10-23 MED ORDER — IPRATROPIUM-ALBUTEROL 0.5-2.5 (3) MG/3ML IN SOLN: 3.0000 mL | Freq: Four times a day (QID) | RESPIRATORY_TRACT | Status: DC | PRN

## 2022-10-23 NOTE — ED Notes (Signed)
[facility] not responsive. Labs sent with chart labels

## 2022-10-23 NOTE — Progress Notes (Addendum)
PROGRESS NOTE    Carlos Levine  OXB:[id number] DOB: 11/26/1940 DOA: 10/22/2022 PCP: [name], MD  Outpatient Specialists: cardiology, oncology    Brief Narrative:   From admission h and p  Carlos Levine is a 82 y.o. male with medical history significant of hypertension, hyperlipidemia, Dm2 with CKD stage 3, sick sinus syndrome, chronic CHFrEF, Pafib, Elevated troponin, hx of prostate cancer w recent admission for cellulitis apparently presents due to worsening foot swelling and redness.  Pt was told by cardiologist to come to ED.  Note that patient declined rehab during recent admission and went home.  No medication change since discharge other than couldn't get midodrine at the pharmacy.   Note prior ultrasound 10/01/2022 during recent admission negative for DVT  Assessment & Plan:   Principal Problem:   Cellulitis in diabetic foot ([place]) Active Problems:   Stage 3b chronic kidney disease (CKD) (HCC)   PAF (paroxysmal atrial fibrillation) (HCC)   Benign essential hypertension   Type 2 diabetes mellitus with diabetic chronic kidney disease (HCC)   Pure hypercholesterolemia   Squamous cell lung cancer, right (HCC)   Sick sinus syndrome (HCC)   Normocytic anemia   Cellulitis   HFrEF (heart failure with reduced ejection fraction) ([place])  # Diabetic foot infection Vs worsening edema. With ulcers on left toes. Edema is a chronic problem last several months. PVLs negative for DVT in November. Sed is elevated - continue vanc/ceftriaxone, add flagyl - x-ray of left foot to eval for osteo, would plan mri if negative - arterial dopplers ordered to eval for PAD - diuresis as below - wound care consult - will check blood cultures but note abx have already been administered - will also order wound culture  # HFrEF Recent EF 20%, recent admit for CHF. Edema has worsened since then - lasix 40 IV bid for home 20 oral bid - strict I/os - given readmission for CHF and severity of  CHF will consult cardiology - cont home farxiga given lower extremity infection  # Pleural effusion Chronic. Normal o2 sat on room air - diuresis as above  # A-fib, paroxysmal HR currently controlled - cont home apixaban  # CAD No chest pain - cont home apixaban, pravastatin  # T2DM No sig hyperglycemia - SSI  # Neuropathy - cont home pregabalin  # CKD 3b Kidney function at baseline - monitor  # Sick sinus Has pacemaker  # Lung cancer In remission, surveilled by oncology    DVT prophylaxis: apixaban Code Status: dnr Family Communication: wife updated telephonically 1/4  Level of care: Telemetry Cardiac Status is: Inpatient Remains inpatient appropriate because: severity of illness    Consultants:  cardiology  Procedures: none  Antimicrobials:  See above    Subjective: Mild lower extremity pain  Objective: Vitals:   10/23/22 0100 10/23/22 0112 10/23/22 0630 10/23/22 1000  BP: 116/77  107/71 116/81  Pulse:   (!) 116 (!) 116  Resp: (!) 43  20   Temp:  98 F (36.7 C) 98 F (36.7 C)   TempSrc:  Oral    SpO2: 95%  99% 99%  Weight:      Height:        Intake/Output Summary (Last 24 hours) at 10/23/2022 1022 Last data filed at 10/23/2022 0947 Gross per 24 hour  Intake 500 ml  Output 825 ml  Net -325 ml   Filed Weights   10/22/22 1404  Weight: 91.4 kg    Examination:  General  exam: Appears calm and comfortable  Respiratory system: Clear to auscultation. Decreased breath sounds in bases Cardiovascular system: S1 & S2 heard, rr, distant heart sounds, systolic murmur Gastrointestinal system: Abdomen is nondistended, soft and nontender. No organomegaly or masses felt. Normal bowel sounds heard. Central nervous system: Alert and oriented. No focal neurological deficits. Extremities: pitting edema up legs Skin: edema and erythema b/l feet left worse than right, ulcers of left toes Psychiatry: Judgement and insight appear normal. Mood & affect  appropriate.     Data Reviewed: I have personally reviewed following labs and imaging studies  CBC: Recent Labs  Lab 10/22/22 1406 10/23/22 0124  WBC 6.7 7.5  NEUTROABS 5.0  --   HGB 11.3* 11.3*  HCT 36.7* 37.1*  MCV 100.5* 101.1*  PLT 183 970   Basic Metabolic Panel: Recent Labs  Lab 10/22/22 1406 10/23/22 0124  NA 138 139  K 4.4 4.6  CL 97* 98  CO2 30 31  GLUCOSE 184* 131*  BUN 76* 72*  CREATININE 1.41* 1.44*  CALCIUM 8.7* 8.4*   GFR: Estimated Creatinine Clearance: 42.7 mL/min (A) (by C-G formula based on SCr of 1.44 mg/dL (H)). Liver Function Tests: Recent Labs  Lab 10/22/22 1406 10/23/22 0124  AST 38 34  ALT 32 31  ALKPHOS 57 54  BILITOT 1.1 1.0  PROT 6.7 6.6  ALBUMIN 3.4* 3.2*   No results for input(s): "LIPASE", "AMYLASE" in the last 168 hours. No results for input(s): "AMMONIA" in the last 168 hours. Coagulation Profile: No results for input(s): "INR", "PROTIME" in the last 168 hours. Cardiac Enzymes: No results for input(s): "CKTOTAL", "CKMB", "CKMBINDEX", "TROPONINI" in the last 168 hours. BNP (last 3 results) No results for input(s): "PROBNP" in the last 8760 hours. HbA1C: No results for input(s): "HGBA1C" in the last 72 hours. CBG: Recent Labs  Lab 10/23/22 0029 10/23/22 0752  GLUCAP 115* 141*   Lipid Profile: No results for input(s): "CHOL", "HDL", "LDLCALC", "TRIG", "CHOLHDL", "LDLDIRECT" in the last 72 hours. Thyroid Function Tests: No results for input(s): "TSH", "T4TOTAL", "FREET4", "T3FREE", "THYROIDAB" in the last 72 hours. Anemia Panel: No results for input(s): "VITAMINB12", "FOLATE", "FERRITIN", "TIBC", "IRON", "RETICCTPCT" in the last 72 hours. Urine analysis:    Component Value Date/Time   COLORURINE YELLOW (A) 10/22/2022 2251   APPEARANCEUR CLEAR (A) 10/22/2022 2251   LABSPEC 1.012 10/22/2022 2251   PHURINE 5.0 10/22/2022 2251   GLUCOSEU 150 (A) 10/22/2022 2251   HGBUR NEGATIVE 10/22/2022 2251   BILIRUBINUR NEGATIVE  10/22/2022 2251   KETONESUR NEGATIVE 10/22/2022 2251   PROTEINUR NEGATIVE 10/22/2022 2251   NITRITE NEGATIVE 10/22/2022 2251   LEUKOCYTESUR NEGATIVE 10/22/2022 2251   Sepsis Labs: @LABRCNTIP (procalcitonin:4,lacticidven:4)  )No results found for this or any previous visit (from the past 240 hour(s)).       Radiology Studies: CT CHEST WO CONTRAST  Result Date: 10/23/2022 CLINICAL DATA:  Right-sided pleural effusion EXAM: CT CHEST WITHOUT CONTRAST TECHNIQUE: Multidetector CT imaging of the chest was performed following the standard protocol without IV contrast. RADIATION DOSE REDUCTION: This exam was performed according to the departmental dose-optimization program which includes automated exposure control, adjustment of the mA and/or kV according to patient size and/or use of iterative reconstruction technique. COMPARISON:  CT 04/01/2022, chest x-ray from the previous day. FINDINGS: Cardiovascular: Limited due to lack of IV contrast. Atherosclerotic calcifications are noted. Heavy coronary calcifications are seen. Heart is not significantly enlarged in size. Lead less pacemaker is noted within the distal aspect of the right ventricle.  Mediastinum/Nodes: Thoracic inlet is within normal limits. No hilar or mediastinal adenopathy is noted. The esophagus as visualized is within normal limits. Lungs/Pleura: Mild emphysematous changes are noted in the left lung. No focal infiltrate or sizable effusion is seen. No sizable parenchymal nodules are noted. On the right there is a large pleural effusion which has increased in the interval from the prior exam. Lower lobe consolidation is noted as well as some upper lobe consolidation likely related to prior treatment. Fullness in the right hilum is noted stable from previous exams. Upper Abdomen: Visualized upper abdomen is within normal limits. Musculoskeletal: No rib abnormality is noted. Degenerative changes of thoracic spine are seen. IMPRESSION: Increasing  consolidation in the right lower and right upper lobes when compared with the prior exam from June of 2023. Increasing right-sided pleural effusion is noted as well. Persistent volume loss in fullness in the right hilum is noted related to prior therapy. No other focal abnormality is noted. Aortic Atherosclerosis (ICD10-I70.0) and Emphysema (ICD10-J43.9). Electronically Signed   By: [name] M.D.   On: 10/23/2022 01:47   DG Chest Portable 1 View  Result Date: 10/22/2022 CLINICAL DATA:  Nonhealing left foot wound and shortness of breath. EXAM: PORTABLE CHEST 1 VIEW COMPARISON:  October 01, 2022 FINDINGS: The cardiac silhouette is mildly enlarged and unchanged in size. There is marked severity calcification of the thoracic aorta. There is stable opacification of the mid right lung and right lung base with subsequent right-sided volume loss. Mild, stable right upper lobe scarring and/or airspace disease is also seen. Chronic appearing increased lung markings are seen throughout the left lung. A stable moderate sized right-sided pleural effusion is noted. No pneumothorax is identified. Multilevel degenerative changes are seen throughout the thoracic spine. IMPRESSION: 1. Stable post treatment changes throughout the right lung with subsequent right-sided volume loss. Electronically Signed   By: Virgina Norfolk M.D.   On: 10/22/2022 21:08        Scheduled Meds:  apixaban  2.5 mg Oral Q12H   dapagliflozin propanediol  10 mg Oral Daily   furosemide  40 mg Intravenous Daily   insulin aspart  0-5 Units Subcutaneous QHS   insulin aspart  0-6 Units Subcutaneous TID WC   pravastatin  80 mg Oral QHS   pregabalin  50 mg Oral BID   sodium chloride flush  3 mL Intravenous Q12H   Continuous Infusions:  sodium chloride     cefTRIAXone (ROCEPHIN)  IV     vancomycin       LOS: 0 days     Desma Maxim, MD [facility]   If 7PM-7AM, please contact night-coverage [email or web address] Password  [facility] 10/23/2022, 10:22 AM

## 2022-10-23 NOTE — Consult Note (Addendum)
[facility] Nurse Consult Note:  Secure chat message sent to the primary team as follows, "There is only one [place] working this week to cover all 6 campuses, since several on our team have Covid. I am working from home and attempted to handle the patient remotely this am. Can you please post photos of the wounds to lower extremities when you have time, no hurry and I will provide topical treatment orders remotely. Thank you!   Reason for Consult: Consult requested for BLE.  Performed remotely after review of progress notes and the bedside nurse wound care flow sheet.   Pt is familiar to [facility] team from recent admission on 12/14. He developed generalized edema to BLE and fluid filled blisters which have evolved into red moist partial thickness skin loss.  Pt is noted to have generalized edema and erythremia and cellulitis, according to progress notes. Topical treatment orders provided for bedside nurses to perform as follows to decrease edema and promote moist healing: Apply Xeroform gauze to left foot wound Q day, then cover with gauze and kerlex.  I will revise the orders if necessary when photos are available.  Thank-you,  Julien Girt MSN, [place], [place], [place], [place]

## 2022-10-23 NOTE — Progress Notes (Signed)
Pharmacy Antibiotic Note  Carlos Levine is a 82 y.o. male admitted on 10/22/2022 with cellulitis.  Pharmacy has been consulted for Vancomycin dosing.  Plan: Vancomycin 2 gm IV X 1 given in ED on 1/3 @ 2250. Vancomycin 750 mg IV Q24H ordered to start on 1/4 @ 2300.  AUC = 452.4 Vanc trough = 12.3   Height: 5\' 8"  (172.7 cm) Weight: 91.4 kg (201 lb 8 oz) IBW/kg (Calculated) : 68.4  Temp (24hrs), Avg:97.6 F (36.4 C), Min:97.4 F (36.3 C), Max:97.7 F (36.5 C)  Recent Labs  Lab 10/22/22 1406  WBC 6.7  CREATININE 1.41*  LATICACIDVEN 1.4    Estimated Creatinine Clearance: 43.6 mL/min (A) (by C-G formula based on SCr of 1.41 mg/dL (H)).    Allergies  Allergen Reactions   Contrast Media [Iodinated Contrast Media] Hives and Rash   Ace Inhibitors Other (See Comments)    Unknown reaction type    Antimicrobials this admission:   >>    >>   Dose adjustments this admission:   Microbiology results:  BCx:   UCx:    Sputum:    MRSA PCR:   Thank you for allowing pharmacy to be a part of this patient's care.  Mar Zettler D 10/23/2022 12:49 AM

## 2022-10-23 NOTE — ED Notes (Signed)
Pt states coming in due to the swelling on his legs, and the water blisters. Pt states the redness to his legs have improved. Blisters and skin break down noted to bilateral lower extremities. Redness noted to bilateral feet as well. Pt on cardiac, bp and pulse ox monitor.

## 2022-10-23 NOTE — ED Notes (Signed)
Report given to Kate, RN

## 2022-10-23 NOTE — Consult Note (Signed)
[place] NOTE       Patient ID: Carlos Levine MRN: [id number] DOB/AGE: Jul 01, 1941 82 y.o.  Admit date: 10/22/2022 Referring Physician Dr. Laurey Arrow Primary Physician Dr. Caryl Comes Primary Cardiologist Dr. Nehemiah Massed Reason for Consultation AoCHF  HPI: Carlos Levine. Strout is an 69yoM with a PMH of HFrEF (20%, global hypo), ?low flow AS (peak grad 24.26mmHg, 2.6m/s peak vel), paroxysmal AF on dose reduced eliquis per pt preference, SSS s/p Medtronic Micra leadless PPM, CKD 3, DM2, PAD s/p L common and ext iliac stents, hx prostate cancer, hx lung cancer s/p XRT who presented to [facility] ED 10/22/22 with worsening left foot swelling and redness at the request of the heart failure clinic.  He was recently admitted from 12/13 - 12/22 for decompensated HFrEF c/b orthostasis and cellulitis of bilateral LE. Cardiology is consulted for assistance with his heart failure.  The patient was recently admitted for 9 days (discharged on 12/22) for decompensated HF complicated by orthostasis and was also treated for bilateral LE cellulitis. During this admission, cardiology was not involved, but the patient was started on farxiga and midodrine, diuretic was changed from torsemide to lasix. He was seen by palliative care, decided on DNR and outpatient hospice services. Discharge was originally planned to a SNF, but ultimately the patient's family preferred to take him home.   He was seen in the HF clinic the afternoon of 1/3 where his pedal wounds/blisters appeared to be worsening, and his weight has increased 13 lbs to 201lbs (upfrom 12/22 at 188lbs). The patient had reportedly eaten a couple bites of salisbury steak, fried fish, and country ham recently. He was advised to present to the ED directly from clinic, which he was agreeable to do. In the ED, he was started on broad spectrum Abx for tx of cellulitis, and lasix 40mg  IV x 1.  At my time of evaluation patient is sitting upright in a recliner in the  ED without family present at bedside. He had returned to his room in the ED after being off the floor for arterial US of both legs. I assisted him with repositioning of his blankets. He states the swelling was initially better when he was discharged, but started up again recently. Feels somewhat short of breath with significant peripheral edema and lower extremity erythema with blister formation and multiple open wounds on both of his feet.  He denies chest pain or palpitations, no orthopnea or PND. He voices that "no one has done anything for me in this damn basement."   Vitals are notable for a BP 108/79, HR in AF RVR with rates in the 110s on tele. He is comfortable on room air. Labs are notable for an AKI with BUN/Cr 76/1.41 and GFR 49, at discharge 12/22 was 44/1.16 and GFR >60. BNP 1426, H/H 11.3/37.1 plts 170. CXR with R post XRT changes with volume loss and cardiomegaly.   Review of systems complete and found to be negative unless listed above     Past Medical History:  Diagnosis Date   Arrhythmia    PAF   Arthritis    Cancer ([place])    prostate   CHF (congestive heart failure) (HCC)    Chronic kidney disease    COPD (chronic obstructive pulmonary disease) (HCC)    Coronary artery disease    Diabetes mellitus without complication ([place])    Dyspnea    with minimal exertion   Dysrhythmia    history of SVT   History of kidney  stones    History of radiation therapy    lung   Hyperkalemia 11/19/2017   Hyperlipidemia    Hypertension    Peripheral vascular disease ([place])    Prostate cancer (HCC)    Squamous cell lung cancer ([place]) 04/2020   also has history of skin cancer    Past Surgical History:  Procedure Laterality Date   AMPUTATION FINGER / THUMB Right    BACK SURGERY  1975   removed 2 discs and fused others   PACEMAKER LEADLESS INSERTION N/A 07/25/2020   Procedure: PACEMAKER LEADLESS INSERTION;  Surgeon: Isaias Cowman, MD;  Location: [place] [facility];  Service:  Cardiovascular;  Laterality: N/A;   PROSTATE CRYOABLATION     SPINAL FUSION     STENT PLACEMENT ILIAC ([place] [facility]X) Left 2014   Left common iliac and left external iliac by Dr. Delana Meyer   VIDEO BRONCHOSCOPY WITH ENDOBRONCHIAL NAVIGATION N/A 05/30/2019   Procedure: [place], DIABETIC;  Surgeon: Ottie Glazier, MD;  Location: [facility];  Service: Thoracic;  Laterality: N/A;   VIDEO BRONCHOSCOPY WITH ENDOBRONCHIAL NAVIGATION N/A 05/28/2020   Procedure: VIDEO BRONCHOSCOPY WITH ENDOBRONCHIAL NAVIGATION;  Surgeon: Ottie Glazier, MD;  Location: [facility];  Service: Thoracic;  Laterality: N/A;   VIDEO BRONCHOSCOPY WITH ENDOBRONCHIAL ULTRASOUND N/A 05/30/2019   Procedure: VIDEO BRONCHOSCOPY WITH ENDOBRONCHIAL ULTRASOUND, DIABETIC;  Surgeon: Ottie Glazier, MD;  Location: [facility];  Service: Thoracic;  Laterality: N/A;   VIDEO BRONCHOSCOPY WITH ENDOBRONCHIAL ULTRASOUND N/A 05/28/2020   Procedure: VIDEO BRONCHOSCOPY WITH ENDOBRONCHIAL ULTRASOUND;  Surgeon: Ottie Glazier, MD;  Location: [facility];  Service: Thoracic;  Laterality: N/A;    (Not in a hospital admission)  Social History   Socioeconomic History   Marital status: Married    Spouse name: [name]   Number of children: 4   Years of education: Not on file   Highest education level: Not on file  Occupational History   Occupation: Games developer    Comment: retired  Tobacco Use   Smoking status: Former    Years: 30.00    Types: Cigarettes    Quit date: 10/20/2009    Years since quitting: 13.0   Smokeless tobacco: Never  Vaping Use   Vaping Use: Never used  Substance and Sexual Activity   Alcohol use: No   Drug use: No   Sexual activity: Yes    Birth control/protection: None  Other Topics Concern   Not on file  Social History Narrative   Patient lives with wife and occasionally daughter and granddaughter.   Social Determinants of Health   Financial Resource Strain: Low Risk  (04/13/2019)   Overall  Financial Resource Strain (CARDIA)    Difficulty of Paying Living Expenses: Not hard at all  Food Insecurity: No Food Insecurity (10/23/2022)   Hunger Vital Sign    Worried About Running Out of Food in the Last Year: Never true    Ran Out of Food in the Last Year: Never true  Transportation Needs: No Transportation Needs (10/23/2022)   PRAPARE - Hydrologist (Medical): No    Lack of Transportation (Non-Medical): No  Physical Activity: Unknown (04/13/2019)   Exercise Vital Sign    Days of Exercise per Week: 0 days    Minutes of Exercise per Session: Not on file  Stress: Stress Concern Present (04/13/2019)   [place]    Feeling of Stress : To some extent  Social Connections: Moderately Isolated (  04/13/2019)   Social Connection and Isolation Panel [NHANES]    Frequency of Communication with Friends and Family: More than three times a week    Frequency of Social Gatherings with Friends and Family: Not on file    Attends Religious Services: Never    Active Member of Clubs or Organizations: No    Attends Archivist Meetings: Never    Marital Status: Married  Human resources officer Violence: Not At Risk (10/23/2022)   Humiliation, Afraid, Rape, and Kick questionnaire    Fear of Current or Ex-Partner: No    Emotionally Abused: No    Physically Abused: No    Sexually Abused: No    Family History  Problem Relation Age of Onset   Lung cancer Father    Pancreatic cancer Father    Brain cancer Mother    Stomach cancer Paternal Aunt    Stomach cancer Paternal Uncle    Cancer Maternal Grandmother    Kidney cancer Maternal Grandfather       Intake/Output Summary (Last 24 hours) at 10/23/2022 1058 Last data filed at 10/23/2022 0947 Gross per 24 hour  Intake 500 ml  Output 825 ml  Net -325 ml    Vitals:   10/23/22 0112 10/23/22 0630 10/23/22 1000 10/23/22 1051  BP:  107/71 116/81 108/79  Pulse:   (!) 116 (!) 116 (!) 118  Resp:  20  17  Temp: 98 F (36.7 C) 98 F (36.7 C)  (!) 97.5 F (36.4 C)  TempSrc: Oral   Oral  SpO2:  99% 99% 100%  Weight:      Height:        PHYSICAL EXAM General: Elderly and ill-appearing Caucasian male, well nourished, in no acute distress.  Sitting upright in recliner, nurse at bedside. HEENT:  Normocephalic and atraumatic. Neck:  + JVD.  Lungs: Slight conversational dyspnea on room air.  Absent breath sounds right base, trace crackles on the left without appreciable wheezes. Heart: HRRR .  Distant heart sounds. normal S1 and S2 without gallops or murmurs.  Abdomen: Mildly distended appearing.  Msk: Normal strength and tone for age. Extremities:  2+ bilateral lower extremity edema with significant erythema and warmth up to mid shin, quarter sized blister to the dorsal medial aspect of his right foot, multiple open wounds on the dorsal aspect of his toes with medicated gauze covering some wounds.  Neuro: Awake and alert Psych: Irritable mood. Answers questions appropriately.   Labs: Basic Metabolic Panel: Recent Labs    10/22/22 1406 10/23/22 0124  NA 138 139  K 4.4 4.6  CL 97* 98  CO2 30 31  GLUCOSE 184* 131*  BUN 76* 72*  CREATININE 1.41* 1.44*  CALCIUM 8.7* 8.4*   Liver Function Tests: Recent Labs    10/22/22 1406 10/23/22 0124  AST 38 34  ALT 32 31  ALKPHOS 57 54  BILITOT 1.1 1.0  PROT 6.7 6.6  ALBUMIN 3.4* 3.2*   No results for input(s): "LIPASE", "AMYLASE" in the last 72 hours. CBC: Recent Labs    10/22/22 1406 10/23/22 0124  WBC 6.7 7.5  NEUTROABS 5.0  --   HGB 11.3* 11.3*  HCT 36.7* 37.1*  MCV 100.5* 101.1*  PLT 183 170   Cardiac Enzymes: No results for input(s): "CKTOTAL", "CKMB", "CKMBINDEX", "TROPONINIHS" in the last 72 hours. BNP: Recent Labs    10/23/22 0124  BNP 1,426.2*   D-Dimer: No results for input(s): "DDIMER" in the last 72 hours. Hemoglobin A1C: No results for  input(s): "HGBA1C" in the  last 72 hours. Fasting Lipid Panel: No results for input(s): "CHOL", "HDL", "LDLCALC", "TRIG", "CHOLHDL", "LDLDIRECT" in the last 72 hours. Thyroid Function Tests: No results for input(s): "TSH", "T4TOTAL", "T3FREE", "THYROIDAB" in the last 72 hours.  Invalid input(s): "FREET3" Anemia Panel: No results for input(s): "VITAMINB12", "FOLATE", "FERRITIN", "TIBC", "IRON", "RETICCTPCT" in the last 72 hours.   Radiology: CT CHEST WO CONTRAST  Result Date: 10/23/2022 CLINICAL DATA:  Right-sided pleural effusion EXAM: CT CHEST WITHOUT CONTRAST TECHNIQUE: Multidetector CT imaging of the chest was performed following the standard protocol without IV contrast. RADIATION DOSE REDUCTION: This exam was performed according to the departmental dose-optimization program which includes automated exposure control, adjustment of the mA and/or kV according to patient size and/or use of iterative reconstruction technique. COMPARISON:  CT 04/01/2022, chest x-ray from the previous day. FINDINGS: Cardiovascular: Limited due to lack of IV contrast. Atherosclerotic calcifications are noted. Heavy coronary calcifications are seen. Heart is not significantly enlarged in size. Lead less pacemaker is noted within the distal aspect of the right ventricle. Mediastinum/Nodes: Thoracic inlet is within normal limits. No hilar or mediastinal adenopathy is noted. The esophagus as visualized is within normal limits. Lungs/Pleura: Mild emphysematous changes are noted in the left lung. No focal infiltrate or sizable effusion is seen. No sizable parenchymal nodules are noted. On the right there is a large pleural effusion which has increased in the interval from the prior exam. Lower lobe consolidation is noted as well as some upper lobe consolidation likely related to prior treatment. Fullness in the right hilum is noted stable from previous exams. Upper Abdomen: Visualized upper abdomen is within normal limits. Musculoskeletal: No rib  abnormality is noted. Degenerative changes of thoracic spine are seen. IMPRESSION: Increasing consolidation in the right lower and right upper lobes when compared with the prior exam from June of 2023. Increasing right-sided pleural effusion is noted as well. Persistent volume loss in fullness in the right hilum is noted related to prior therapy. No other focal abnormality is noted. Aortic Atherosclerosis (ICD10-I70.0) and Emphysema (ICD10-J43.9). Electronically Signed   By: [name] M.D.   On: 10/23/2022 01:47   DG Chest Portable 1 View  Result Date: 10/22/2022 CLINICAL DATA:  Nonhealing left foot wound and shortness of breath. EXAM: PORTABLE CHEST 1 VIEW COMPARISON:  October 01, 2022 FINDINGS: The cardiac silhouette is mildly enlarged and unchanged in size. There is marked severity calcification of the thoracic aorta. There is stable opacification of the mid right lung and right lung base with subsequent right-sided volume loss. Mild, stable right upper lobe scarring and/or airspace disease is also seen. Chronic appearing increased lung markings are seen throughout the left lung. A stable moderate sized right-sided pleural effusion is noted. No pneumothorax is identified. Multilevel degenerative changes are seen throughout the thoracic spine. IMPRESSION: 1. Stable post treatment changes throughout the right lung with subsequent right-sided volume loss. Electronically Signed   By: Virgina Norfolk M.D.   On: 10/22/2022 21:08   US Venous Img Lower Bilateral (DVT)  Result Date: 10/01/2022 CLINICAL DATA:  Leg pain and swelling. EXAM: BILATERAL LOWER EXTREMITY VENOUS DOPPLER ULTRASOUND TECHNIQUE: Gray-scale sonography with compression, as well as color and duplex ultrasound, were performed to evaluate the deep venous system(s) from the level of the common femoral vein through the popliteal and proximal calf veins. COMPARISON:  None Available. FINDINGS: VENOUS Normal compressibility of the common femoral,  superficial femoral, and popliteal veins, as well as the visualized calf veins. Visualized  portions of profunda femoral vein and great saphenous vein unremarkable. No filling defects to suggest DVT on grayscale or color Doppler imaging. Doppler waveforms show normal direction of venous flow, normal respiratory plasticity and response to augmentation. OTHER Subcutaneous soft tissue edema bilateral calf. Limitations: none IMPRESSION: Negative. Electronically Signed   By: Keane Police D.O.   On: 10/01/2022 22:48   DG Chest 2 View  Result Date: 10/01/2022 CLINICAL DATA:  Leg swelling. EXAM: CHEST - 2 VIEW COMPARISON:  06/29/2022 FINDINGS: Stable significant post treatment changes involving the right hemithorax with loss of volume and changes of radiation fibrosis. Associated right pleural effusion. The left lung remains relatively clear. No pulmonary lesions, overt pulmonary edema or left pleural effusion. IMPRESSION: 1. Stable significant post treatment changes involving the right hemithorax. 2. The left lung remains relatively clear. No left-sided pleural effusion. Electronically Signed   By: Marijo Sanes M.D.   On: 10/01/2022 11:58    ECHO 09/08/2022 ECHOCARDIOGRAPHIC MEASUREMENTS  2D DIMENSIONS  AORTA                  Values   Normal Range   MAIN PA         Values    Normal Range                Annulus: nm*          [2.3-2.9]         PA Main: nm*       [1.5-2.1]              Aorta Sin: nm*          [3.1-3.7]    RIGHT VENTRICLE            ST Junction: nm*          [2.6-3.2]         RV Base: nm*       [<4.2]             Asc.Aorta: nm*          [2.6-3.4]          RV Mid: nm*       [<3.5]  LEFT VENTRICLE                                      RV Length: nm*       [<8.6]                 LVIDd: 5.0 cm       [4.2-5.9]    INFERIOR VENA CAVA                  LVIDs: 4.7 cm                        Max. IVC: nm*       [<=2.1]                    FS: 5.4 %        [>25]            Min. IVC: nm*                     SWT: 0.83 cm      [0.6-1.0]    ------------------  PWT: 0.83 cm      [0.6-1.0]    nm* - not measured  LEFT ATRIUM                LA Diam: 4.6 cm       [3.0-4.0]            LA A4C Area: nm*          [<20]             LA Volume: nm*          [18-58]  _________________________________________________________________________________________  ECHOCARDIOGRAPHIC DESCRIPTIONS  AORTIC ROOT                   Size: Normal             Dissection: INDETERM FOR DISSECTION  AORTIC VALVE               Leaflets: Tricuspid                   Morphology: SEVERELY THICKENED               Mobility: PARTIALLY MOBILE  LEFT VENTRICLE                   Size: Normal                        Anterior: HYPOCONTRACTILE            Contraction: Normal                         Lateral: HYPOCONTRACTILE             Closest EF: 20% (Estimated)                 Septal: HYPOCONTRACTILE              LV Masses: No Masses                       Apical: HYPOCONTRACTILE                    LVH: None                          Inferior: HYPOCONTRACTILE                                                      Posterior: HYPOCONTRACTILE           Dias.FxClass: N/A  MITRAL VALVE               Leaflets: Normal                        Mobility: Fully mobile             Morphology: THICKENED LEAFLET(S)  LEFT ATRIUM                   Size: MILDLY ENLARGED              LA Masses: No masses              IA Septum: Normal IAS  MAIN PA  Size: Normal  PULMONIC VALVE             Morphology: Normal                        Mobility: Fully mobile  RIGHT VENTRICLE              RV Masses: No Masses                         Size: Normal              Free Wall: Normal                     Contraction: Normal  TRICUSPID VALVE               Leaflets: Normal                        Mobility: Fully mobile             Morphology: Normal  RIGHT ATRIUM                   Size: Normal                        RA Other: None                 RA Mass: No masses  PERICARDIUM                 Fluid: No effusion  INFERIOR VENACAVA                   Size: Normal Normal respiratory collapse  _________________________________________________________________________________________   DOPPLER ECHO and OTHER SPECIAL PROCEDURES                 Aortic: MILD AR                    SEVERE AS                         249.0 cm/sec peak vel      24.8 mmHg peak grad                         14.0 mmHg mean grad        0.80 cm^2 by DOPPLER                 Mitral: MILD MR                    No MS                         MV Inflow E Vel = nm*      MV Annulus E'Vel = nm*                         E/E'Ratio = nm*              Tricuspid: MILD TR                    No TS                         313.0 cm/sec  peak TR vel   54.2 mmHg peak RV pressure              Pulmonary: MILD PR                    No PS  _________________________________________________________________________________________  INTERPRETATION  SEVERE LV SYSTOLIC DYSFUNCTION (See above)  NORMAL RIGHT VENTRICULAR SYSTOLIC FUNCTION  MILD VALVULAR REGURGITATION (See above)  SEVERE VALVULAR STENOSIS (See above)  Poor sound wave transmission significantly limited this study - pt with significant AS  and MS  _________________________________________________________________________________________  Electronically signed by      Rusty Aus, MD on 09/09/2022 08: 108 PM           Performed By: Scherrie November, RCS     Ordering Physician: [facility]   TELEMETRY reviewed by me (LT) 10/23/2022 : None available for review  EKG reviewed by me: Ectopic atrial rhythm with rate 98 bpm  Data reviewed by me (LT) 10/23/2022: heart failure clinic note, last outpatient cardiology note, CBC BMP BNP vitals EKGs, ED note admission H&P  Principal Problem:   Cellulitis in diabetic foot ([place]) Active Problems:   Benign essential hypertension   Stage 3b chronic kidney disease (CKD) ([place])   Type 2  diabetes mellitus with diabetic chronic kidney disease ([place])   Pure hypercholesterolemia   Squamous cell lung cancer, right ([place])   Sick sinus syndrome ([place])   PAF (paroxysmal atrial fibrillation) ([place])   Normocytic anemia   Cellulitis   HFrEF (heart failure with reduced ejection fraction) ([place])    ASSESSMENT AND PLAN:  Carlos Levine is an 58yoM with a PMH of HFrEF (20%, global hypo), ?low flow AS (peak grad 24.22mmHg, 2.41m/s peak vel), paroxysmal AF on dose reduced eliquis per pt preference, SSS s/p Medtronic Micra leadless PPM, CKD 3, DM2, PAD s/p L common and ext iliac stents, hx prostate cancer, hx lung cancer s/p XRT who presented to [facility] ED 10/22/22 with worsening left foot swelling and redness at the request of the heart failure clinic.  He was recently admitted from 12/13 - 12/22 for decompensated HFrEF c/b orthostasis and cellulitis of bilateral LE. Cardiology is consulted for assistance with his heart failure.  # acute on chronic HFrEF (<20%, global hypo) # ?low flow AS Presents for his second admission in 1 month with worsening peripheral edema c/b LE cellulitis and nonhealing ulcers. Echo 08/2022 at his PCP's office showed an EF <20%, with mild-mod AS, possibly low flow/gradient with poor LV output. Appears clinically hypervolemic on exam with significant lower extremity edema. BNP 1400. Admits to dietary indiscretions (country ham). Possibly AS contributing.  - s/p IV lasix 40mg  x 1, agree to continue BID dosing for now - GDMT initiation/titration limited by hypotension. Will add low dose metoprolol XL 12.5mg  once daily with hold parameters.  - echo complete - salt and fluid restriction, strict I/O, daily weights   # LE cellulitis # PAD s/p left common and ext iliac stents - on abx per primary team, arterial u/s pending   # paroxysmal AF RVR # SSS s/p Micra PPM  Not currently on tele, rhythm appears regular on exam.  -add metop as above -continue eliquis for stroke prevention  on 2.5mg  bid per his preference, although he does not technically meet criteria for reduced dosing.   # AKI on CKD 3  Baseline from discharge on 12/22 44/1.16, GFR >60. On admission Cr. 1.44 and GFR 48. Monitor with diuresis  This patient's plan of  care was discussed and created with Dr. Clayborn Bigness and he is in agreement.  Signed: Tristan Schroeder , PA-C 10/23/2022, 10:58 AM [facility]

## 2022-10-23 NOTE — ED Notes (Signed)
Wound to BLE cleaned with NS and dressed according to order.

## 2022-10-23 NOTE — ED Notes (Signed)
Patient transported to CT 

## 2022-10-24 ENCOUNTER — Encounter
Admission: EM | Disposition: A | Discharge status: Hospice | Payer: Self-pay | Source: Home / Self Care | Attending: Internal Medicine

## 2022-10-24 ENCOUNTER — Other Ambulatory Visit: Payer: Self-pay

## 2022-10-24 ENCOUNTER — Inpatient Hospital Stay
Admit: 2022-10-24 | Discharge: 2022-10-24 | Disposition: A | Discharge status: Home / Self Care | Payer: Medicare Other | Attending: Cardiology | Admitting: Cardiology

## 2022-10-24 ENCOUNTER — Encounter: Payer: Self-pay | Admitting: Vascular Surgery

## 2022-10-24 DIAGNOSIS — L0390 Cellulitis, unspecified: Secondary | ICD-10-CM

## 2022-10-24 DIAGNOSIS — R609 Edema, unspecified: Secondary | ICD-10-CM

## 2022-10-24 DIAGNOSIS — E11628 Type 2 diabetes mellitus with other skin complications: Secondary | ICD-10-CM | POA: Diagnosis not present

## 2022-10-24 DIAGNOSIS — L03119 Cellulitis of unspecified part of limb: Secondary | ICD-10-CM | POA: Diagnosis not present

## 2022-10-24 DIAGNOSIS — I70249 Atherosclerosis of native arteries of left leg with ulceration of unspecified site: Secondary | ICD-10-CM | POA: Diagnosis not present

## 2022-10-24 DIAGNOSIS — L97919 Non-pressure chronic ulcer of unspecified part of right lower leg with unspecified severity: Secondary | ICD-10-CM

## 2022-10-24 DIAGNOSIS — I70239 Atherosclerosis of native arteries of right leg with ulceration of unspecified site: Secondary | ICD-10-CM

## 2022-10-24 DIAGNOSIS — L97929 Non-pressure chronic ulcer of unspecified part of left lower leg with unspecified severity: Secondary | ICD-10-CM | POA: Diagnosis not present

## 2022-10-24 HISTORY — PX: LOWER EXTREMITY ANGIOGRAPHY: CATH118251

## 2022-10-24 LAB — GLUCOSE, CAPILLARY
Glucose-Capillary: 100 mg/dL — ABNORMAL HIGH (ref 70–99)
Glucose-Capillary: 99 mg/dL (ref 70–99)
Glucose-Capillary: 154 mg/dL — ABNORMAL HIGH (ref 70–99)
Glucose-Capillary: 171 mg/dL — ABNORMAL HIGH (ref 70–99)

## 2022-10-24 LAB — BASIC METABOLIC PANEL
Anion gap: 11 (ref 5–15)
BUN: 68 mg/dL — ABNORMAL HIGH (ref 8–23)
CO2: 27 mmol/L (ref 22–32)
Calcium: 8.1 mg/dL — ABNORMAL LOW (ref 8.9–10.3)
Chloride: 100 mmol/L (ref 98–111)
Creatinine, Ser: 1.47 mg/dL — ABNORMAL HIGH (ref 0.61–1.24)
GFR, Estimated: 47 mL/min — ABNORMAL LOW (ref >60)
Glucose, Bld: 87 mg/dL (ref 70–99)
Potassium: 4.2 mmol/L (ref 3.5–5.1)
Sodium: 138 mmol/L (ref 135–145)

## 2022-10-24 LAB — CBC WITH DIFFERENTIAL/PLATELET
Abs Immature Granulocytes: 0.03 10*3/uL (ref 0.00–0.07)
Basophils Absolute: 0.1 10*3/uL (ref 0.0–0.1)
Basophils Relative: 1 %
Eosinophils Absolute: 0 10*3/uL (ref 0.0–0.5)
Eosinophils Relative: 1 %
HCT: 37.8 % — ABNORMAL LOW (ref 39.0–52.0)
Hemoglobin: 11.5 g/dL — ABNORMAL LOW (ref 13.0–17.0)
Immature Granulocytes: 1 %
Lymphocytes Relative: 12 %
Lymphs Abs: 0.8 10*3/uL (ref 0.7–4.0)
MCH: 30.8 pg (ref 26.0–34.0)
MCHC: 30.4 g/dL (ref 30.0–36.0)
MCV: 101.3 fL — ABNORMAL HIGH (ref 80.0–100.0)
Monocytes Absolute: 0.8 10*3/uL (ref 0.1–1.0)
Monocytes Relative: 13 %
Neutro Abs: 4.8 10*3/uL (ref 1.7–7.7)
Neutrophils Relative %: 72 %
Platelets: 165 10*3/uL (ref 150–400)
RBC: 3.73 MIL/uL — ABNORMAL LOW (ref 4.22–5.81)
RDW: 17.2 % — ABNORMAL HIGH (ref 11.5–15.5)
WBC: 6.6 10*3/uL (ref 4.0–10.5)
nRBC: 0 % (ref 0.0–0.2)

## 2022-10-24 LAB — PHOSPHORUS: Phosphorus: 5.6 mg/dL — ABNORMAL HIGH (ref 2.5–4.6)

## 2022-10-24 LAB — CBG MONITORING, ED: Glucose-Capillary: 86 mg/dL (ref 70–99)

## 2022-10-24 LAB — MAGNESIUM: Magnesium: 2.4 mg/dL (ref 1.7–2.4)

## 2022-10-24 SURGERY — LOWER EXTREMITY ANGIOGRAPHY
Anesthesia: Moderate Sedation | Laterality: Left

## 2022-10-24 MED ORDER — ONDANSETRON HCL 4 MG/2ML IJ SOLN: 4.0000 mg | Freq: Four times a day (QID) | INTRAMUSCULAR | Status: DC | PRN

## 2022-10-24 MED ORDER — IODIXANOL 320 MG/ML IV SOLN
INTRAVENOUS | Status: DC | PRN
  Administered 2022-10-24: 45 mL

## 2022-10-24 MED ORDER — ENSURE ENLIVE PO LIQD
237.0000 mL | Freq: Three times a day (TID) | ORAL | Status: DC
  Administered 2022-10-24 – 2022-11-03 (×13): 237 mL via ORAL

## 2022-10-24 MED ORDER — MORPHINE SULFATE (PF) 2 MG/ML IV SOLN
0.5000 mg | Freq: Once | INTRAVENOUS | Status: DC
  Filled 2022-10-24: qty 1

## 2022-10-24 MED ORDER — MIDAZOLAM HCL 2 MG/2ML IJ SOLN
INTRAMUSCULAR | Status: AC
  Filled 2022-10-24: qty 2

## 2022-10-24 MED ORDER — SODIUM CHLORIDE 0.9% FLUSH
3.0000 mL | Freq: Two times a day (BID) | INTRAVENOUS | Status: DC
  Administered 2022-10-24 – 2022-11-08 (×27): 3 mL via INTRAVENOUS

## 2022-10-24 MED ORDER — METHYLPREDNISOLONE SODIUM SUCC 125 MG IJ SOLR
125.0000 mg | Freq: Once | INTRAMUSCULAR | Status: AC | PRN
  Administered 2022-10-24: 125 mg via INTRAVENOUS

## 2022-10-24 MED ORDER — SODIUM CHLORIDE 0.9 % IV SOLN: INTRAVENOUS | Status: AC

## 2022-10-24 MED ORDER — DIPHENHYDRAMINE HCL 50 MG/ML IJ SOLN
INTRAMUSCULAR | Status: AC
  Filled 2022-10-24: qty 1

## 2022-10-24 MED ORDER — SODIUM CHLORIDE 0.9 % IV SOLN: 250.0000 mL | INTRAVENOUS | Status: DC | PRN

## 2022-10-24 MED ORDER — CHLORHEXIDINE GLUCONATE 4 % EX LIQD: 60.0000 mL | Freq: Once | CUTANEOUS | Status: DC

## 2022-10-24 MED ORDER — METHYLPREDNISOLONE SODIUM SUCC 125 MG IJ SOLR
INTRAMUSCULAR | Status: AC
  Filled 2022-10-24: qty 2

## 2022-10-24 MED ORDER — SODIUM CHLORIDE 0.9% FLUSH
3.0000 mL | INTRAVENOUS | Status: DC | PRN
  Administered 2022-11-06 (×2): 3 mL via INTRAVENOUS

## 2022-10-24 MED ORDER — FAMOTIDINE 20 MG PO TABS
ORAL_TABLET | ORAL | Status: AC
  Filled 2022-10-24: qty 2

## 2022-10-24 MED ORDER — MORPHINE SULFATE (PF) 2 MG/ML IV SOLN
1.0000 mg | INTRAVENOUS | Status: DC | PRN
  Administered 2022-10-24: 1 mg via INTRAVENOUS
  Filled 2022-10-24: qty 1

## 2022-10-24 MED ORDER — HYDROMORPHONE HCL 1 MG/ML IJ SOLN: 1.0000 mg | Freq: Once | INTRAMUSCULAR | Status: DC | PRN

## 2022-10-24 MED ORDER — FUROSEMIDE 10 MG/ML IJ SOLN
20.0000 mg | Freq: Once | INTRAMUSCULAR | Status: AC
  Administered 2022-10-24: 20 mg via INTRAVENOUS
  Filled 2022-10-24: qty 4

## 2022-10-24 MED ORDER — IPRATROPIUM-ALBUTEROL 0.5-2.5 (3) MG/3ML IN SOLN
3.0000 mL | Freq: Two times a day (BID) | RESPIRATORY_TRACT | Status: DC
  Administered 2022-10-25 – 2022-10-27 (×5): 3 mL via RESPIRATORY_TRACT
  Filled 2022-10-24 (×5): qty 3

## 2022-10-24 MED ORDER — CEFAZOLIN SODIUM-DEXTROSE 2-4 GM/100ML-% IV SOLN: 2.0000 g | INTRAVENOUS | Status: DC

## 2022-10-24 MED ORDER — OXYCODONE HCL 5 MG PO TABS: 5.0000 mg | ORAL_TABLET | ORAL | Status: DC | PRN

## 2022-10-24 MED ORDER — HEPARIN SODIUM (PORCINE) 1000 UNIT/ML IJ SOLN
INTRAMUSCULAR | Status: AC
  Filled 2022-10-24: qty 10

## 2022-10-24 MED ORDER — MIDAZOLAM HCL 2 MG/ML PO SYRP: 8.0000 mg | ORAL_SOLUTION | Freq: Once | ORAL | Status: DC | PRN

## 2022-10-24 MED ORDER — FENTANYL CITRATE PF 50 MCG/ML IJ SOSY
PREFILLED_SYRINGE | INTRAMUSCULAR | Status: AC
  Filled 2022-10-24: qty 1

## 2022-10-24 MED ORDER — DIPHENHYDRAMINE HCL 50 MG/ML IJ SOLN
50.0000 mg | Freq: Once | INTRAMUSCULAR | Status: AC | PRN
  Administered 2022-10-24: 50 mg via INTRAVENOUS

## 2022-10-24 MED ORDER — ACETAMINOPHEN 325 MG PO TABS: 650.0000 mg | ORAL_TABLET | ORAL | Status: DC | PRN

## 2022-10-24 MED ORDER — FENTANYL CITRATE PF 50 MCG/ML IJ SOSY: 12.5000 ug | PREFILLED_SYRINGE | Freq: Once | INTRAMUSCULAR | Status: DC | PRN

## 2022-10-24 MED ORDER — PROSIGHT PO TABS
1.0000 | ORAL_TABLET | Freq: Every day | ORAL | Status: DC
  Administered 2022-10-25 – 2022-10-30 (×5): 1 via ORAL
  Filled 2022-10-24 (×11): qty 1

## 2022-10-24 MED ORDER — SODIUM CHLORIDE 0.9 % IV SOLN: INTRAVENOUS | Status: DC

## 2022-10-24 MED ORDER — VITAMIN C 500 MG PO TABS
500.0000 mg | ORAL_TABLET | Freq: Two times a day (BID) | ORAL | Status: DC
  Administered 2022-10-24 – 2022-11-03 (×11): 500 mg via ORAL
  Filled 2022-10-24 (×13): qty 1

## 2022-10-24 MED ORDER — FAMOTIDINE 20 MG PO TABS
40.0000 mg | ORAL_TABLET | Freq: Once | ORAL | Status: AC | PRN
  Administered 2022-10-24: 40 mg via ORAL

## 2022-10-24 SURGICAL SUPPLY — 15 items
CATH OMNI FLUSH 5F 65CM (CATHETERS) IMPLANT
COVER PROBE ULTRASOUND 5X96 (MISCELLANEOUS) IMPLANT
DEVICE STARCLOSE SE CLOSURE (Vascular Products) IMPLANT
GLIDEWIRE ADV .035X260CM (WIRE) IMPLANT
GOWN STRL REUS W/ TWL LRG LVL3 (GOWN DISPOSABLE) ×1 IMPLANT
GOWN STRL REUS W/TWL LRG LVL3 (GOWN DISPOSABLE) ×1
NDL ENTRY 21GA 7CM ECHOTIP (NEEDLE) IMPLANT
NEEDLE ENTRY 21GA 7CM ECHOTIP (NEEDLE) ×1 IMPLANT
PACK ANGIOGRAPHY (CUSTOM PROCEDURE TRAY) ×1 IMPLANT
SET INTRO CAPELLA COAXIAL (SET/KITS/TRAYS/PACK) IMPLANT
SHEATH BRITE TIP 5FRX11 (SHEATH) IMPLANT
SHIELD X-DRAPE GOLD 12X17 (MISCELLANEOUS) IMPLANT
SYR MEDRAD MARK 7 150ML (SYRINGE) IMPLANT
TUBING CONTRAST HIGH PRESS 72 (TUBING) IMPLANT
WIRE GUIDERIGHT .035X150 (WIRE) IMPLANT

## 2022-10-24 NOTE — Consult Note (Signed)
Hospital Consult    Reason for Consult:  Left Lower extremity wound/Ulcer Requesting Physician:  Dr. Acquanetta Belling MD MRN #:  [id number]  History of Present Illness: This is a 82 y.o. male with past medical history of significant heart failure who presents because of worsening wound on the top of the left foot.  He was admitted at the end of December for CHF with wound on the foot was treated with IV antibiotics.  He was also diuresed and was discharged.  Family notes that the swelling and pain have worsened since that time.  He has not had fevers but they do think he is slightly more altered than baseline.  Patient is denying any new shortness of breath.  Patient certainly looks volume overloaded has significant peripheral edema which is likely in part while his wounds are nonhealing.  He has erythema over the dorsum of the left foot with a scabbing wound likely was the site of his prior blister.  It is tender to palpation but there is no crepitus.  Does look worse than prior pictures from last month.   Patient does endorse pain while walking and at rest.  Patient does endorse bilateral lower extremity swelling on a daily basis.  This may be related more to his heart failure.  Patient does endorse that wounds are getting worse in the recent months.  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 Delana Meyer   VIDEO BRONCHOSCOPY WITH ENDOBRONCHIAL NAVIGATION N/A 05/30/2019   Procedure: VIDEO BRONCHOSCOPY WITH ENDOBRONCHIAL NAVIGATION, DIABETIC;  Surgeon: Ottie Glazier, MD;  Location: [facility];  Service: Thoracic;  Laterality: N/A;   VIDEO BRONCHOSCOPY WITH ENDOBRONCHIAL NAVIGATION N/A 05/28/2020   Procedure: VIDEO BRONCHOSCOPY WITH ENDOBRONCHIAL NAVIGATION;  Surgeon: Ottie Glazier, MD;  Location: [facility];  Service: Thoracic;  Laterality: N/A;   VIDEO BRONCHOSCOPY WITH ENDOBRONCHIAL ULTRASOUND N/A 05/30/2019   Procedure: VIDEO BRONCHOSCOPY WITH ENDOBRONCHIAL ULTRASOUND, DIABETIC;  Surgeon: Ottie Glazier, MD;  Location: [facility];  Service: Thoracic;  Laterality: N/A;   VIDEO BRONCHOSCOPY WITH ENDOBRONCHIAL ULTRASOUND N/A 05/28/2020   Procedure: VIDEO BRONCHOSCOPY WITH ENDOBRONCHIAL ULTRASOUND;  Surgeon: Ottie Glazier, MD;  Location: [facility];  Service: Thoracic;  Laterality: N/A;  Allergies  Allergen Reactions   Contrast Media [Iodinated Contrast Media] Hives and Rash   Ace Inhibitors Other (See Comments)    Unknown reaction type    Prior to Admission medications   Medication Sig Start Date End Date Taking? Authorizing Provider  apixaban (ELIQUIS) 2.5 MG TABS tablet Take 1 tablet (2.5 mg total) by mouth every 12 (twelve) hours. 10/10/22  Yes Annita Brod, MD  ascorbic acid (VITAMIN C) 500 MG tablet Take 1 tablet (500 mg total) by mouth 2 (two) times daily. 10/10/22  Yes Annita Brod, MD  bacitracin  ointment Apply topically 2 (two) times daily. Apply to blisters on legs 10/10/22  Yes Annita Brod, MD  bicalutamide (CASODEX) 50 MG tablet Take 1 tablet (50 mg total) by mouth daily at 12 noon. 11/28/21  Yes Stoioff, Ronda Fairly, MD  dapagliflozin propanediol (FARXIGA) 10 MG TABS tablet Take 1 tablet (10 mg total) by mouth daily. 10/10/22  Yes Annita Brod, MD  feeding supplement (ENSURE ENLIVE / ENSURE PLUS) LIQD Take 237 mLs by mouth 3 (three) times daily between meals. 10/10/22  Yes Annita Brod, MD  ipratropium-albuterol (DUONEB) 0.5-2.5 (3) MG/3ML SOLN Take 3 mLs by nebulization 2 (two) times daily. 09/24/20  Yes [provider]  Multiple Vitamin (MULTIVITAMIN WITH MINERALS) TABS tablet Take 1 tablet by mouth daily. 10/10/22  Yes Annita Brod, MD  pravastatin (PRAVACHOL) 80 MG tablet Take 80 mg by mouth at bedtime.  01/02/19  Yes [provider]  pregabalin (LYRICA) 50 MG capsule Take 1 capsule (50 mg total) by mouth 2 (two) times daily. 10/10/22  Yes Annita Brod, MD  traZODone (DESYREL) 50 MG tablet Take 0.5 tablets (25 mg total) by mouth at bedtime as needed for sleep. 10/10/22  Yes Annita Brod, MD  zinc sulfate 220 (50 Zn) MG capsule Take 1 capsule (220 mg total) by mouth daily. 10/10/22  Yes Annita Brod, MD  bisacodyl (DULCOLAX) 5 MG EC tablet Take 1 tablet (5 mg total) by mouth daily as needed for moderate constipation. 10/10/22   Annita Brod, MD  furosemide (LASIX) 20 MG tablet Take 1 tablet (20 mg total) by mouth 2 (two) times daily. 10/10/22   Annita Brod, MD  midodrine (PROAMATINE) 10 MG tablet Take 1 tablet (10 mg total) by mouth 3 (three) times daily with meals. Patient not taking: Reported on 10/22/2022 10/10/22   Annita Brod, MD  polyethylene glycol (MIRALAX / GLYCOLAX) 17 g packet Take 17 g by mouth daily. Patient not taking: Reported on 10/22/2022 10/10/22   Annita Brod, MD  valACYclovir (VALTREX) 500  MG tablet Take 500 mg by mouth 3 (three) times daily as needed. Patient not taking: Reported on 10/22/2022 06/13/22   [provider]    Social History   Socioeconomic History   Marital status: Married    Spouse name: [name]   Number of children: 4   Years of education: Not on file   Highest education level: Not on file  Occupational History   Occupation: Games developer    Comment: retired  Tobacco Use   Smoking status: Former    Years: 30.00    Types: Cigarettes    Quit date: 10/20/2009    Years since quitting: 13.0   Smokeless tobacco: Never  Vaping Use   Vaping Use: Never used  Substance and Sexual Activity   Alcohol use: No   Drug use: No   Sexual activity: Yes  Birth control/protection: None  Other Topics Concern   Not on file  Social History Narrative   Patient lives with wife and occasionally daughter and granddaughter.   Social Determinants of Health   Financial Resource Strain: Low Risk  (04/13/2019)   Overall Financial Resource Strain (CARDIA)    Difficulty of Paying Living Expenses: Not hard at all  Food Insecurity: No Food Insecurity (10/23/2022)   Hunger Vital Sign    Worried About Running Out of Food in the Last Year: Never true    Ran Out of Food in the Last Year: Never true  Transportation Needs: No Transportation Needs (10/23/2022)   PRAPARE - Hydrologist (Medical): No    Lack of Transportation (Non-Medical): No  Physical Activity: Unknown (04/13/2019)   Exercise Vital Sign    Days of Exercise per Week: 0 days    Minutes of Exercise per Session: Not on file  Stress: Stress Concern Present (04/13/2019)   [place]    Feeling of Stress : To some extent  Social Connections: Moderately Isolated (04/13/2019)   Social Connection and Isolation Panel [NHANES]    Frequency of Communication with Friends and Family: More than three times a week    Frequency of Social  Gatherings with Friends and Family: Not on file    Attends Religious Services: Never    Active Member of Clubs or Organizations: No    Attends Archivist Meetings: Never    Marital Status: Married  Human resources officer Violence: Not At Risk (10/23/2022)   Humiliation, Afraid, Rape, and Kick questionnaire    Fear of Current or Ex-Partner: No    Emotionally Abused: No    Physically Abused: No    Sexually Abused: No     Family History  Problem Relation Age of Onset   Lung cancer Father    Pancreatic cancer Father    Brain cancer Mother    Stomach cancer Paternal Aunt    Stomach cancer Paternal Uncle    Cancer Maternal Grandmother    Kidney cancer Maternal Grandfather     ROS: Otherwise negative unless mentioned in HPI  Physical Examination  Vitals:   10/24/22 0151 10/24/22 0554  BP:  92/72  Pulse:  94  Resp:  20  Temp: 98.4 F (36.9 C) 98.2 F (36.8 C)  SpO2:  94%   Body mass index is 30.64 kg/m.  General:  WDWN in NAD Gait: Not observed HENT: WNL, normocephalic Pulmonary: normal non-labored breathing, without Rales, rhonchi,  wheezing Cardiac: irregular, with PVC's , without  Murmurs, rubs or gallops; without carotid bruits Abdomen: Positive Bowel sounds, soft, NT/ND, no masses Skin: without rashes Vascular Exam/Pulses: Bilateral Lower extremity cellulitis, with left open foot wound and right sore with blister. Bilateral lower extremity swelling. Extremities: with ischemic changes, without Gangrene , with cellulitis; with open wounds;  Musculoskeletal: no muscle wasting or atrophy  Neurologic: A&O X 3;  No focal weakness or paresthesias are detected; speech is fluent/normal Psychiatric:  The pt has Normal affect. Lymph:  Unremarkable  CBC    Component Value Date/Time   WBC 6.6 10/24/2022 0432   RBC 3.73 (L) 10/24/2022 0432   HGB 11.5 (L) 10/24/2022 0432   HCT 37.8 (L) 10/24/2022 0432   PLT 165 10/24/2022 0432   MCV 101.3 (H) 10/24/2022 0432   MCH  30.8 10/24/2022 0432   MCHC 30.4 10/24/2022 0432   RDW 17.2 (H) 10/24/2022 9628  LYMPHSABS 0.8 10/24/2022 0432   MONOABS 0.8 10/24/2022 0432   EOSABS 0.0 10/24/2022 0432   BASOSABS 0.1 10/24/2022 0432    BMET    Component Value Date/Time   NA 138 10/24/2022 0432   NA 139 11/30/2012 0914   K 4.2 10/24/2022 0432   K 5.2 (H) 11/30/2012 0914   CL 100 10/24/2022 0432   CL 107 11/30/2012 0914   CO2 27 10/24/2022 0432   CO2 24 11/30/2012 0914   GLUCOSE 87 10/24/2022 0432   GLUCOSE 197 (H) 11/30/2012 0914   BUN 68 (H) 10/24/2022 0432   BUN 20 (H) 11/30/2012 0914   CREATININE 1.47 (H) 10/24/2022 0432   CREATININE 1.31 (H) 11/30/2012 0914   CALCIUM 8.1 (L) 10/24/2022 0432   CALCIUM 9.1 11/30/2012 0914   GFRNONAA 47 (L) 10/24/2022 0432   GFRNONAA 54 (L) 11/30/2012 0914   GFRAA 46 (L) 11/28/2019 0934   GFRAA >60 11/30/2012 0914    COAGS: Lab Results  Component Value Date   INR 1.0 05/09/2020   INR 1.1 05/26/2019   INR 1.1 04/13/2019     Non-Invasive Vascular Imaging:   N/A  Statin:  Yes.   Beta Blocker:  Yes.   Aspirin:  No. ACEI:  No. ARB:  No. CCB use:  No Other antiplatelets/anticoagulants:  Yes.   Eliquis 2.5 MG BID    ASSESSMENT/PLAN: Mr Lortie is a 82 y.o. male with pmh HFrEF, COPD, DM, CAD who presents because of concern for foot infection.  Patient notes increasing pain and swelling to the top of the left foot.  He was admitted at the end of December for cellulitis and lower extremity swelling. He has chronic heart failure EF about 20%.   He appears to have bilateral lower extremity ischemia.  He does have pain at rest.  Left lower extremity is worse than his right.  Plan is to take him to the vascular lab today 10/24/2022 for a left lower extremity angiogram with possible angioplasty and stent placement.  I have discussed the procedure in detail including the benefits, risks, complications.  Patient's questions were answered.  Patient verbalizes his understanding.   Would like to proceed with the procedure today.      Drema Pry [facility] 10/24/2022 8:40 AM

## 2022-10-24 NOTE — ED Notes (Signed)
Pt bed alarm going off. Upon entering room, RN found pt attempting to get up. Pt stated he needed to get dressed to be at his procedure on time. RN informed pt of the time and assured him that he did not need to get dressed and that he would get to procedure as planned later in the morning. Pt then began attempting to remove monitoring equipment and IV. Male purewick was already removed by pt. RN redirected patient and replaced monitoring equipment. IV remains in place currently. Upon laying back down pt began speaking to and about men present in the room. RN informed pt that no other men in the room, just himself and the RN. Pt eventually was redirected and allowed RN to cleanse him and change his linens. Pt then was assisted with positioning for comfort. Male purewick reapplied. Bed alarm remains in use.

## 2022-10-24 NOTE — Consult Note (Addendum)
[place] Nurse wound follow up Refer to previous [place] consult yesterday.  Photos are now available and have been reviewed.  Pt has a full thickness wound which is dark red/yellow, dry and crusted; no change in the plan of care which was previously provided is indicated.  Vascular team is now following for assessment and plan of care; please refer to their team for further questions.  Please re-consult if further assistance is needed.  Gae Dry MSN, RN, [place], [place], [place] .

## 2022-10-24 NOTE — Progress Notes (Signed)
Initial Nutrition Assessment  DOCUMENTATION CODES:   Non-severe (moderate) malnutrition in context of chronic illness  INTERVENTION:   Ensure Enlive po TID, each supplement provides 350 kcal and 20 grams of protein.  Ocuvite daily for wound healing (provides zinc, vitamin A, vitamin C, Vitamin E, copper, and selenium)  Vitamin C 500mg  po BID  Pt at high refeed risk; recommend monitor potassium, magnesium and phosphorus labs daily until stable  Daily weights   NUTRITION DIAGNOSIS:   Moderate Malnutrition related to chronic illness (COPD, CHF, prostate cancer, lung cancer) as evidenced by moderate fat depletion, severe fat depletion, moderate muscle depletion, severe muscle depletion.  GOAL:   Patient will meet greater than or equal to 90% of their needs  MONITOR:   PO intake, Supplement acceptance, Labs, Weight trends, Skin, I & O's  REASON FOR ASSESSMENT:   Consult Assessment of nutrition requirement/status  ASSESSMENT:   82 y/o male with h/o CHF, COPD, DM, HLD, HTN, CAD, pacemaker, prostate cancer, SCLC s/p XRT, CKD III and blisters on bilateral feet who is admitted with foot infection and cellulitis.  -Pt s/p aortogram today  Met with pt in room today. Pt with confusion, fidgeting and grabbing at stuff in the air. Pt is unable to provide any reliable nutrition related history. Pt is able to say "no" when asked if he is hungry. Pt's lunch tray was untouched on his side table. Pt is familiar to nutrition department from a recent previous admission. Pt with decreased oral intake during his last admission. Pt does drink Ensure supplements at home. RD will add supplements and vitamins to help pt meet his estimated needs and to support wound healing. Pt is at refeed risk. Per chart, pt is up ~13lbs from his UBW currently; will order daily weights. Vascular recommending bilateral femoral endarterectomies with stenting of the SFA and above-knee popliteal arteries.   Medications  reviewed and include: pepcid, lasix, insulin, solu-medrol, ceftriaxone, metronidazole, vancomycin   Labs reviewed: K 4.2 wnl, BUN 68(H), creat 1.47(H), P 5.6(H), Mg 2.4 wnl BNP- 1426(H)- 1/4 Cbgs- 99, 100, 86 x 24 hrs  AIC 7.1(H)- 12/14  NUTRITION - FOCUSED PHYSICAL EXAM:  Flowsheet Row Most Recent Value  Orbital Region Mild depletion  Upper Arm Region Severe depletion  Thoracic and Lumbar Region Moderate depletion  Buccal Region Mild depletion  Temple Region Moderate depletion  Clavicle Bone Region Severe depletion  Clavicle and Acromion Bone Region Severe depletion  Scapular Bone Region Moderate depletion  Dorsal Hand Severe depletion  Patellar Region Moderate depletion  Anterior Thigh Region Moderate depletion  Posterior Calf Region Moderate depletion  Edema (RD Assessment) Moderate  Hair Reviewed  Eyes Reviewed  Mouth Reviewed  Skin Reviewed  Nails Reviewed   Diet Order:   Diet Order             Diet Carb Modified Fluid consistency: Thin; Room service appropriate? Yes  Diet effective now                  EDUCATION NEEDS:   Not appropriate for education at this time  Skin:  Skin Assessment: Reviewed RN Assessment (R foot blister, L foot wound)  Last BM:  pta  Height:   Ht Readings from Last 1 Encounters:  10/22/22 5\' 8"  (1.727 m)    Weight:   Wt Readings from Last 1 Encounters:  10/22/22 91.4 kg    Ideal Body Weight:  70 kg  BMI:  Body mass index is 30.64 kg/m.  Estimated Nutritional Needs:  Kcal:  1900-2200kcal/day  Protein:  95-110g/day  Fluid:  1.8-2.1L/day  Koleen Distance MS, RD, LDN Please refer to [facility] for RD and/or RD on-call/weekend/after hours pager

## 2022-10-24 NOTE — Progress Notes (Signed)
*  PRELIMINARY RESULTS* Echocardiogram 2D Echocardiogram has been performed.  Sherrie Sport 10/24/2022, 7:55 AM

## 2022-10-24 NOTE — OR Nursing (Signed)
TRANSFERRED TO 215. NOTICED WEDDING BAND IN BED. MOVED RING AND TOP DENTURE TO DENTURE CUP IN WHITE BELONGING BAG AND PLACED BY SINK. WIFE CALLED TO TELL HER OF ROOM NUMBER AND LOCATION OF TOP DENTURE AND WEDDING RING. HAND OFF COMPLETED WITH OLU NURSE FOR 215.

## 2022-10-24 NOTE — OR Nursing (Signed)
Wife given update over phone. Pt fidgeting and reaching in air for objects. Wife said sometimes he does that at home. He will be alert and oriented then suddenly starts picking in the air and confused. Purewik in place no urine noted. Bladder scan showed 200 ml. Pt when responsive to questions said "no I dont" when asked if he has to pee. When asked where he was he said hospital but did not answer when asked his name. Report called to [place]. Assessment of  mentation for possible sitter will be done upon arrival to rm 215, per Programmer, systems.

## 2022-10-24 NOTE — Op Note (Signed)
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  Abdominal aortogram  2.  Selective injection of the left lower extremity third order catheter placement  3.  Ultrasound-guided access to the right common femoral artery  4.  StarClose right femoral artery    Anesthesia: Conscious sedation was administered by the interventional radiology RN under my direct supervision. IV was utilized as the patient is very frail. Continuous ECG, pulse oximetry and blood pressure was monitored throughout the entire procedure.    Sheath: 5 French 11 cm Pinnacle sheath retrograde right common femoral  Contrast: 45 cc   Fluoroscopy Time: 3.5 minutes  Indications:  The patient presents to [facility] with atherosclerotic occlusive disease bilateral lower extremities with bilateral ulcers.  Pedal pulses are nonpalpable bilaterally suggesting hemodynamically significant atherosclerotic occlusive disease.  The risks and benefits as well as alternative therapies for lower extremity revascularization are reviewed with the patient all questions are answered the patient agrees to proceed.  The patient is therefore undergoing angiography with the hope for intervention for limb salvage.   Procedure:  Carlos Levine a 82 y.o. male who was identified and appropriate procedural time out was performed.  The patient was then placed supine on the table and prepped and draped in the usual sterile fashion.  Ultrasound was used to evaluate the right common femoral artery.  It was echolucent and pulsatile indicating it is patent .  An ultrasound image was acquired for the permanent record.  A micropuncture needle  was used to access the right common femoral artery under direct ultrasound guidance.  The microwire was then advanced under fluoroscopic guidance without difficulty followed by the micro-sheath.  A 0.035 J wire was advanced without resistance and a 5Fr sheath was placed.    Omni Flush catheter was then advanced to the level of T12 and AP projection of the aorta was obtained. Pigtail catheter was then repositioned to above the bifurcation and RAO view of the pelvis was obtained.  Advantage wire and Omni Flush catheter was then used across the bifurcation and the catheter was positioned in the distal external iliac artery.  LAO of the left groin was then obtained. Wire was reintroduced and negotiated into the SFA and the catheter was advanced into the SFA. Distal runoff was then performed.  After review of these images the catheter was removed over a wire.  Then right lower extremity imaging was performed by hand-injection through the right common femoral sheath.  After review of the images the catheter was removed over wire and an RAO view of the groin was obtained. StarClose device was deployed without difficulty.   Findings:   Aortogram: The abdominal aorta is opacified with a bolus injection contrast.  Single renal arteries are noted bilaterally with normal nephrograms.  No evidence of hemodynamically significant renal artery stenosis.  There are no hemodynamically significant stenoses identified within the aorta.  The aortic bifurcation is mildly diseased but widely patent.  Bilateral common internal and external iliac arteries are free of hemodynamically significant lesions.  Previously placed stent in the left common iliac artery as well as the left external iliac artery is widely patent without evidence of significant restenosis  Left lower Extremity: The left common femoral demonstrates profound disease from its  midportion to the femoral bifurcation the lesion is greater than 90% stenotic, the origin  profunda femoris is also greater than 90% stenotic distally the profunda is patent but diffusely diseased.  The origin of the superficial femoral artery is greater than 90% stenotic.  Distal to this more focal lesion there is diffuse hemodynamically significant disease throughout the length of the superficial femoral artery including Hunter's canal and popliteal artery demonstrate mild to moderate atherosclerotic changes from its midportion distally that does not appear to be hemodynamically significant stenosis. Trifurcation is patent.  There is two-vessel runoff to the foot via the peroneal and the anterior tibial.             Right lower Extremity: The right common femoral demonstrates greater than 80% stenosis distally at the level of the femoral bifurcation which extends into the origins of the superficial femoral and profunda femoris arteries as a hemodynamically significant stenosis, the mid and distal profunda femoris demonstrates diffuse disease but is relatively free of hemodynamically significant stenosis.  The mid and distal superficial femoral and proximal popliteal arteries demonstrate severe diffuse atherosclerotic changes.  There are no hemodynamically significant lesions in the right popliteal from its midportion distally.  Trifurcation is patent although the anterior and posterior tibial is occluded.  There is 1 vessel runoff to the foot via the peroneal but is difficult to ascertain the distal most anatomy secondary to contrast (given the patient's renal dysfunction I elected not to pursue further imaging on the right side).  SUMMARY: Based on these images no intervention is performed at this time.  The patient will require bilateral femoral endarterectomies with stenting of the SFA and above-knee popliteal arteries.    Disposition: Patient was taken to the recovery room in stable condition having tolerated the procedure well.  Carlos Levine 10/24/2022,10:35 AM

## 2022-10-24 NOTE — TOC Initial Note (Addendum)
Transition of Care [facility]) - Initial/Assessment Note    Patient Details  Name: Carlos Levine MRN: [id number] Date of Birth: 04-Nov-1940  Transition of Care [facility]) CM/SW Contact:    Beverly Sessions, RN Phone Number: 10/24/2022, 3:03 PM  Clinical Narrative:                    Patient was recently discharged 12/22 Information below was obtained from previous admission . Lives at home with wife Son and daughter live locally PCP Caryl Comes,  Has RW, cane, shower seat and rollator in the home  Declined SNF and home health on 12/22   WOC consult, and vascular consult. May benefit from home health wound care depending on what final recs      Patient Goals and CMS Choice            Expected Discharge Plan and Services                                              Prior Living Arrangements/Services                       Activities of Daily Living Home Assistive Devices/Equipment: Cane (specify quad or straight), Walker (specify type), Dentures (specify type), Eyeglasses ADL Screening (condition at time of admission) Patient's cognitive ability adequate to safely complete daily activities?: Yes Is the patient deaf or have difficulty hearing?: No Does the patient have difficulty seeing, even when wearing glasses/contacts?: No Does the patient have difficulty concentrating, remembering, or making decisions?: No Patient able to express need for assistance with ADLs?: Yes Does the patient have difficulty dressing or bathing?: No Independently performs ADLs?: Yes (appropriate for developmental age) Does the patient have difficulty walking or climbing stairs?: Yes Weakness of Legs: Both Weakness of Arms/Hands: None  Permission Sought/Granted                  Emotional Assessment              Admission diagnosis:  Cellulitis [L03.90] Peripheral edema [R60.9] Cellulitis, unspecified cellulitis site [L03.90] Patient Active Problem List   Diagnosis Date  Noted   Peripheral edema 10/24/2022   Cellulitis 10/23/2022   HFrEF (heart failure with reduced ejection fraction) ([place]) 10/23/2022   Normocytic anemia 10/22/2022   Hyponatremia 10/06/2022   Orthostatic hypotension 10/05/2022   Generalized weakness 10/05/2022   Malnutrition of moderate degree 10/03/2022   Constipation 10/03/2022   Cellulitis in diabetic foot ([place]) 10/02/2022   Acute on chronic HFrEF (heart failure with reduced ejection fraction) ([place]) 10/01/2022   Drug-induced gynecomastia 04/03/2022   PAF (paroxysmal atrial fibrillation) ([place]) 04/03/2022   Loss of weight 12/18/2021   History of prostate cancer 12/18/2021   S/P placement of leadless cardiac pacemaker 08/09/2020   Sick sinus syndrome ([place]) 07/25/2020   Coronary artery disease involving native coronary artery of native heart 09/23/2019   SOB (shortness of breath) 05/26/2019   SVT (supraventricular tachycardia) 05/26/2019   Squamous cell lung cancer, right ([place]) 04/20/2019   Localized enlarged lymph nodes 03/29/2019   Goals of care, counseling/discussion 03/29/2019   Prostate cancer ([place]) 03/08/2019   Chronic bilateral low back pain without sciatica 02/21/2018   Chronic constipation 11/19/2017   Elevated troponin 06/11/2017   Dizziness 06/24/2016   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   Sore of lower lip  05/13/2015   Benign essential hypertension 02/02/2015   Type 2 diabetes mellitus with diabetic chronic kidney disease ([place]) 02/02/2015   Pure hypercholesterolemia 02/02/2015   Lumbar degenerative disc disease 02/02/2015   Stage 3b chronic kidney disease (CKD) ([place]) 05/12/2014   Malignant neoplasm of prostate ([place]) 07/14/2012   Hypertrophy of breast 07/14/2012   ED (erectile dysfunction) of organic origin 07/14/2012   PCP:  [name], MD Pharmacy:   [id number] - [place], [place] - [address] [place] [address] Phone: [phone] Fax:  [phone]     Social Determinants of Health (SDOH) Social History: SDOH Screenings   Food Insecurity: No Food Insecurity (10/23/2022)  Housing: Low Risk  (10/23/2022)  Transportation Needs: No Transportation Needs (10/23/2022)  Utilities: Not At Risk (10/23/2022)  Financial Resource Strain: Low Risk  (04/13/2019)  Physical Activity: Unknown (04/13/2019)  Social Connections: Moderately Isolated (04/13/2019)  Stress: Stress Concern Present (04/13/2019)  Tobacco Use: Medium Risk (10/22/2022)   SDOH Interventions:     Readmission Risk Interventions    10/24/2022    3:03 PM  Readmission Risk Prevention Plan  Transportation Screening Complete  Medication Review (RN Care Manager) Complete  SW Recovery Care/Counseling Consult Complete  Palliative Care Screening Complete

## 2022-10-24 NOTE — Progress Notes (Signed)
PROGRESS NOTE    Carlos Levine  BPZ:[id number] DOB: 1941-02-03 DOA: 10/22/2022 PCP: [name], MD  Outpatient Specialists: cardiology, oncology    Brief Narrative:   From admission h and p  Carlos Levine is a 82 y.o. male with medical history significant of hypertension, hyperlipidemia, Dm2 with CKD stage 3, sick sinus syndrome, chronic CHFrEF, Pafib, Elevated troponin, hx of prostate cancer w recent admission for cellulitis apparently presents due to worsening foot swelling and redness.  Pt was told by cardiologist to come to ED.  Note that patient declined rehab during recent admission and went home.  No medication change since discharge other than couldn't get midodrine at the pharmacy.   Note prior ultrasound 10/01/2022 during recent admission negative for DVT  Assessment & Plan:   Principal Problem:   Cellulitis in diabetic foot ([place]) Active Problems:   Stage 3b chronic kidney disease (CKD) (HCC)   PAF (paroxysmal atrial fibrillation) (HCC)   Benign essential hypertension   Type 2 diabetes mellitus with diabetic chronic kidney disease (HCC)   Pure hypercholesterolemia   Squamous cell lung cancer, right (HCC)   Sick sinus syndrome (HCC)   Normocytic anemia   Cellulitis   HFrEF (heart failure with reduced ejection fraction) ([place])   Peripheral edema  # Diabetic foot infection PAD contributing. Edema is a chronic problem last several months. PVLs negative for DVT in November.   - continue vanc/ceftriaxone, added flagyl - x-ray of left foot no signs osteo, consider mri if no amputation - diuresis as below - wound care consult - blood cultures ngtd, wound culture too young to read  # PAD Abnormal arterial dopplers. Vascular surgery consulted. Angiogram today shows severe b/l disease. Per vascular if we proceed will need b/l femoral endarterectomies w/ stenting of the SFA and above-knee popliteal arteries. Will require a lengthy surgery. Cardiology has been asked to  weigh in regarding perioperative risks/benefits.   # HFrEF Recent EF 20%, recent admit for CHF. Edema has worsened since then. Edema improving. - lasix 40 IV bid for home 20 oral bid - strict I/os - cardiology following - cont home farxiga given lower extremity infection  # Pleural effusion Chronic. Normal o2 sat on room air - diuresis as above  # A-fib, paroxysmal HR currently controlled - cont home apixaban, reduced dose per patient preference  # CAD No chest pain - cont home apixaban, pravastatin  # T2DM No sig hyperglycemia - SSI  # Neuropathy - cont home pregabalin  # CKD 3b Kidney function at baseline - monitor  # Sick sinus Has pacemaker  # Lung cancer In remission, surveilled by oncology    DVT prophylaxis: apixaban Code Status: dnr Family Communication: wife updated telephonically 1/5  Level of care: Telemetry Medical Status is: Inpatient Remains inpatient appropriate because: severity of illness Dispo: TBD. PT consult ordered    Consultants:  Cardiology, vascular surgery  Procedures: none  Antimicrobials:  See above    Subjective: Mild lower extremity pain  Objective: Vitals:   10/24/22 1131 10/24/22 1204 10/24/22 1305 10/24/22 1410  BP: (!) 111/56 113/84 105/83 (!) 153/88  Pulse: 93  98 81  Resp: 15 (!) 21 (!) 22 20  Temp:    (!) 97.5 F (36.4 C)  TempSrc:      SpO2: 92% 96% 93% 90%  Weight:      Height:        Intake/Output Summary (Last 24 hours) at 10/24/2022 1412 Last data filed at 10/24/2022 1322  Gross per 24 hour  Intake 487.71 ml  Output --  Net 487.71 ml   Filed Weights   10/22/22 1404  Weight: 91.4 kg    Examination:  General exam: Appears calm and comfortable, confused after surgery Respiratory system: Clear to auscultation. Decreased breath sounds in bases Cardiovascular system: S1 & S2 heard, rr, distant heart sounds, systolic murmur Gastrointestinal system: Abdomen is nondistended, soft and nontender.  No organomegaly or masses felt. Normal bowel sounds heard. Central nervous system: Alert and oriented. No focal neurological deficits. Extremities: pitting edema up legs, improving Skin: LE wounds dressed Psychiatry: Judgement and insight appear normal. Mood & affect appropriate.     Data Reviewed: I have personally reviewed following labs and imaging studies  CBC: Recent Labs  Lab 10/22/22 1406 10/23/22 0124 10/24/22 0432  WBC 6.7 7.5 6.6  NEUTROABS 5.0  --  4.8  HGB 11.3* 11.3* 11.5*  HCT 36.7* 37.1* 37.8*  MCV 100.5* 101.1* 101.3*  PLT 183 170 222   Basic Metabolic Panel: Recent Labs  Lab 10/22/22 1406 10/23/22 0124 10/24/22 0432  NA 138 139 138  K 4.4 4.6 4.2  CL 97* 98 100  CO2 30 31 27   GLUCOSE 184* 131* 87  BUN 76* 72* 68*  CREATININE 1.41* 1.44* 1.47*  CALCIUM 8.7* 8.4* 8.1*  MG  --   --  2.4  PHOS  --   --  5.6*   GFR: Estimated Creatinine Clearance: 41.8 mL/min (A) (by C-G formula based on SCr of 1.47 mg/dL (H)). Liver Function Tests: Recent Labs  Lab 10/22/22 1406 10/23/22 0124  AST 38 34  ALT 32 31  ALKPHOS 57 54  BILITOT 1.1 1.0  PROT 6.7 6.6  ALBUMIN 3.4* 3.2*   No results for input(s): "LIPASE", "AMYLASE" in the last 168 hours. No results for input(s): "AMMONIA" in the last 168 hours. Coagulation Profile: No results for input(s): "INR", "PROTIME" in the last 168 hours. Cardiac Enzymes: No results for input(s): "CKTOTAL", "CKMB", "CKMBINDEX", "TROPONINI" in the last 168 hours. BNP (last 3 results) No results for input(s): "PROBNP" in the last 8760 hours. HbA1C: No results for input(s): "HGBA1C" in the last 72 hours. CBG: Recent Labs  Lab 10/23/22 1653 10/23/22 2324 10/24/22 0814 10/24/22 0912 10/24/22 1037  GLUCAP 186* 235* 86 100* 99   Lipid Profile: No results for input(s): "CHOL", "HDL", "LDLCALC", "TRIG", "CHOLHDL", "LDLDIRECT" in the last 72 hours. Thyroid Function Tests: No results for input(s): "TSH", "T4TOTAL",  "FREET4", "T3FREE", "THYROIDAB" in the last 72 hours. Anemia Panel: No results for input(s): "VITAMINB12", "FOLATE", "FERRITIN", "TIBC", "IRON", "RETICCTPCT" in the last 72 hours. Urine analysis:    Component Value Date/Time   COLORURINE YELLOW (A) 10/22/2022 2251   APPEARANCEUR CLEAR (A) 10/22/2022 2251   LABSPEC 1.012 10/22/2022 2251   PHURINE 5.0 10/22/2022 2251   GLUCOSEU 150 (A) 10/22/2022 2251   HGBUR NEGATIVE 10/22/2022 2251   BILIRUBINUR NEGATIVE 10/22/2022 2251   KETONESUR NEGATIVE 10/22/2022 2251   PROTEINUR NEGATIVE 10/22/2022 2251   NITRITE NEGATIVE 10/22/2022 2251   LEUKOCYTESUR NEGATIVE 10/22/2022 2251   Sepsis Labs: @LABRCNTIP (procalcitonin:4,lacticidven:4)  ) Recent Results (from the past 240 hour(s))  Culture, blood (Routine X 2) w Reflex to ID Panel     Status: None (Preliminary result)   Collection Time: 10/23/22  2:43 PM   Specimen: BLOOD  Result Value Ref Range Status   Specimen Description BLOOD BLOOD LEFT HAND  Final   Special Requests BOTTLES DRAWN AEROBIC AND ANAEROBIC BCLV  Final  Culture   Final    NO GROWTH < 24 HOURS Performed at [facility], [address] [place], [place] [address]    Report Status PENDING  Incomplete  Culture, blood (Routine X 2) w Reflex to ID Panel     Status: None (Preliminary result)   Collection Time: 10/23/22  3:30 PM   Specimen: BLOOD  Result Value Ref Range Status   Specimen Description BLOOD BLOOD RIGHT HAND  Final   Special Requests BOTTLES DRAWN AEROBIC AND ANAEROBIC BCLV  Final   Culture   Final    NO GROWTH < 24 HOURS Performed at [facility], [address], [place], [place] [address]    Report Status PENDING  Incomplete  Aerobic Culture w Gram Stain (superficial specimen)     Status: None (Preliminary result)   Collection Time: 10/23/22  4:44 PM   Specimen: Wound  Result Value Ref Range Status   Specimen Description   Final    WOUND Performed at [facility], [address] [place], [place] [address]    Special Requests   Final    FOOT Performed at [facility], [address] [place], [place] [address]    Gram Stain   Final    FEW WBC PRESENT, PREDOMINANTLY PMN FEW GRAM POSITIVE COCCI IN PAIRS RARE BUDDING YEAST SEEN RARE GRAM NEGATIVE RODS    Culture   Final    TOO YOUNG TO READ Performed at [facility], [place] [address], [place], [place] [address]    Report Status PENDING  Incomplete         Radiology Studies: PERIPHERAL VASCULAR CATHETERIZATION  Result Date: 10/24/2022 See surgical note for result.  US ARTERIAL LOWER EXTREMITY DUPLEX BILATERAL  Result Date: 10/23/2022 CLINICAL DATA:  82 year old male with a infection EXAM: NONINVASIVE PHYSIOLOGIC VASCULAR STUDY OF BILATERAL LOWER EXTREMITIES TECHNIQUE: Evaluation of both lower extremities was performed at rest, including calculation of ankle-brachial indices, directed duplex COMPARISON:  None Available. FINDINGS: Right ABI:  Not acquired Left ABI:  Not acquired Right Lower Extremity: Directed duplex of the right lower extremity demonstrates triphasic waveform of the common femoral artery, profunda femoris and proximal SFA. Distal SFA and popliteal artery demonstrate monophasic waveform. Posterior tibial artery and anterior tibial artery monophasic. No elevated velocity identified. Left Lower Extremity: Directed duplex of the left lower extremity demonstrates monophasic postobstructive waveform of the common femoral artery, profunda femoris, SFA, popliteal artery. Monophasic posterior tibial artery and anterior tibial artery. The imaged portions of the SFA are patent. No elevated velocities. IMPRESSION: Right: Directed duplex of the right lower extremity demonstrates evidence of multifocal SFA disease, contributing to monophasic popliteal and tibial artery waveforms. Left: Directed duplex of the left lower extremity demonstrates occlusive disease of the iliac system, with  patency of the left femoropopliteal system, monophasic waveforms throughout. Signed, Dulcy Fanny. Nadene Rubins, RPVI [facility] Electronically Signed   By: Corrie Mckusick D.O.   On: 10/23/2022 14:12   DG Foot 2 Views Left  Result Date: 10/23/2022 CLINICAL DATA:  Left foot blisters and skin breakdown. EXAM: LEFT FOOT - 2 VIEW COMPARISON:  None Available. FINDINGS: No bony destruction or periosteal reaction. No acute fracture or dislocation. Joint spaces are preserved. Bone mineralization is normal. Dorsal foot soft tissue swelling. IMPRESSION: 1. Dorsal foot soft tissue swelling. No acute osseous abnormality. Electronically Signed   By: Titus Dubin M.D.   On: 10/23/2022 11:30   CT  CHEST WO CONTRAST  Result Date: 10/23/2022 CLINICAL DATA:  Right-sided pleural effusion EXAM: CT CHEST WITHOUT CONTRAST TECHNIQUE: Multidetector CT imaging of the chest was performed following the standard protocol without IV contrast. RADIATION DOSE REDUCTION: This exam was performed according to the departmental dose-optimization program which includes automated exposure control, adjustment of the mA and/or kV according to patient size and/or use of iterative reconstruction technique. COMPARISON:  CT 04/01/2022, chest x-ray from the previous day. FINDINGS: Cardiovascular: Limited due to lack of IV contrast. Atherosclerotic calcifications are noted. Heavy coronary calcifications are seen. Heart is not significantly enlarged in size. Lead less pacemaker is noted within the distal aspect of the right ventricle. Mediastinum/Nodes: Thoracic inlet is within normal limits. No hilar or mediastinal adenopathy is noted. The esophagus as visualized is within normal limits. Lungs/Pleura: Mild emphysematous changes are noted in the left lung. No focal infiltrate or sizable effusion is seen. No sizable parenchymal nodules are noted. On the right there is a large pleural effusion which  has increased in the interval from the prior exam. Lower lobe consolidation is noted as well as some upper lobe consolidation likely related to prior treatment. Fullness in the right hilum is noted stable from previous exams. Upper Abdomen: Visualized upper abdomen is within normal limits. Musculoskeletal: No rib abnormality is noted. Degenerative changes of thoracic spine are seen. IMPRESSION: Increasing consolidation in the right lower and right upper lobes when compared with the prior exam from June of 2023. Increasing right-sided pleural effusion is noted as well. Persistent volume loss in fullness in the right hilum is noted related to prior therapy. No other focal abnormality is noted. Aortic Atherosclerosis (ICD10-I70.0) and Emphysema (ICD10-J43.9). Electronically Signed   By: [name] M.D.   On: 10/23/2022 01:47   DG Chest Portable 1 View  Result Date: 10/22/2022 CLINICAL DATA:  Nonhealing left foot wound and shortness of breath. EXAM: PORTABLE CHEST 1 VIEW COMPARISON:  October 01, 2022 FINDINGS: The cardiac silhouette is mildly enlarged and unchanged in size. There is marked severity calcification of the thoracic aorta. There is stable opacification of the mid right lung and right lung base with subsequent right-sided volume loss. Mild, stable right upper lobe scarring and/or airspace disease is also seen. Chronic appearing increased lung markings are seen throughout the left lung. A stable moderate sized right-sided pleural effusion is noted. No pneumothorax is identified. Multilevel degenerative changes are seen throughout the thoracic spine. IMPRESSION: 1. Stable post treatment changes throughout the right lung with subsequent right-sided volume loss. Electronically Signed   By: Virgina Norfolk M.D.   On: 10/22/2022 21:08        Scheduled Meds:  apixaban  2.5 mg Oral Q12H   diphenhydrAMINE       famotidine       furosemide  20 mg Intravenous Once   insulin aspart  0-5 Units  Subcutaneous QHS   insulin aspart  0-6 Units Subcutaneous TID WC   ipratropium-albuterol  3 mL Nebulization BID   methylPREDNISolone sodium succinate        morphine injection  0.5 mg Intravenous Once   pravastatin  80 mg Oral QHS   pregabalin  50 mg Oral BID   sodium chloride flush  3 mL Intravenous Q12H   sodium chloride flush  3 mL Intravenous Q12H   Continuous Infusions:  sodium chloride     sodium chloride 75 mL/hr at 10/24/22 1339   sodium chloride     cefTRIAXone (ROCEPHIN)  IV Stopped (10/23/22 2330)  metronidazole Stopped (10/24/22 0055)   vancomycin Stopped (10/24/22 0216)     LOS: 1 day     Desma Maxim, MD [facility]   If 7PM-7AM, please contact night-coverage [email or web address] Password [email or web address] 10/24/2022, 2:12 PM

## 2022-10-24 NOTE — Progress Notes (Signed)
Pharmacy Antibiotic Note  Carlos Levine is a 82 y.o. male admitted on 10/22/2022 with cellulitis.  Pharmacy has been consulted for Vancomycin dosing.  Plan: Vancomycin 2 gm IV X 1 given in ED on 1/3 @ 2250. Continue Vancomycin 750 mg IV Q24H   AUC = 452.4 Vanc trough = 12.3 Scr 1.44 >> 1.47   Height: 5\' 8"  (172.7 cm) Weight: 91.4 kg (201 lb 8 oz) IBW/kg (Calculated) : 68.4  Temp (24hrs), Avg:98 F (36.7 C), Min:97.5 F (36.4 C), Max:98.4 F (36.9 C)  Recent Labs  Lab 10/22/22 1406 10/23/22 0124 10/24/22 0432  WBC 6.7 7.5 6.6  CREATININE 1.41* 1.44* 1.47*  LATICACIDVEN 1.4  --   --      Estimated Creatinine Clearance: 41.8 mL/min (A) (by C-G formula based on SCr of 1.47 mg/dL (H)).    Allergies  Allergen Reactions   Contrast Media [Iodinated Contrast Media] Hives and Rash   Ace Inhibitors Other (See Comments)    Unknown reaction type   Latex Rash    Antimicrobials this admission: Vanc 1/3 >>  CTO 1/3 >> Metronidazole 1/3 >>    Dose adjustments this admission: None   Microbiology results: 1/4 BCx: NGTD  1/4 Wound Cx: Few GPC, Rare GNR, budding yeast  Thank you for allowing pharmacy to be a part of this patient's care.  Alison Murray 10/24/2022 11:45 AM

## 2022-10-24 NOTE — ED Notes (Signed)
Patient removed IV in L hand. Blood noted on pt, sheet, floor, and gown. Pt cleansed and gown changed. Floor and bed cleaned as well. Pt requests to leave sheets currently. RN to attempt to change sheets again in a bit after pt has had time to decompress. Pt irritable. New IV placed.

## 2022-10-25 DIAGNOSIS — E11628 Type 2 diabetes mellitus with other skin complications: Secondary | ICD-10-CM | POA: Diagnosis not present

## 2022-10-25 DIAGNOSIS — L03119 Cellulitis of unspecified part of limb: Secondary | ICD-10-CM | POA: Diagnosis not present

## 2022-10-25 LAB — GLUCOSE, CAPILLARY
Glucose-Capillary: 167 mg/dL — ABNORMAL HIGH (ref 70–99)
Glucose-Capillary: 225 mg/dL — ABNORMAL HIGH (ref 70–99)
Glucose-Capillary: 193 mg/dL — ABNORMAL HIGH (ref 70–99)
Glucose-Capillary: 294 mg/dL — ABNORMAL HIGH (ref 70–99)

## 2022-10-25 LAB — BASIC METABOLIC PANEL
Anion gap: 14 (ref 5–15)
BUN: 79 mg/dL — ABNORMAL HIGH (ref 8–23)
CO2: 26 mmol/L (ref 22–32)
Calcium: 8.5 mg/dL — ABNORMAL LOW (ref 8.9–10.3)
Chloride: 98 mmol/L (ref 98–111)
Creatinine, Ser: 1.69 mg/dL — ABNORMAL HIGH (ref 0.61–1.24)
GFR, Estimated: 40 mL/min — ABNORMAL LOW (ref >60)
Glucose, Bld: 236 mg/dL — ABNORMAL HIGH (ref 70–99)
Potassium: 4.7 mmol/L (ref 3.5–5.1)
Sodium: 138 mmol/L (ref 135–145)

## 2022-10-25 LAB — MAGNESIUM: Magnesium: 2.6 mg/dL — ABNORMAL HIGH (ref 1.7–2.4)

## 2022-10-25 LAB — CBC
HCT: 39.9 % (ref 39.0–52.0)
Hemoglobin: 12.1 g/dL — ABNORMAL LOW (ref 13.0–17.0)
MCH: 30 pg (ref 26.0–34.0)
MCHC: 30.3 g/dL (ref 30.0–36.0)
MCV: 99 fL (ref 80.0–100.0)
Platelets: 171 10*3/uL (ref 150–400)
RBC: 4.03 MIL/uL — ABNORMAL LOW (ref 4.22–5.81)
RDW: 17.2 % — ABNORMAL HIGH (ref 11.5–15.5)
WBC: 4.3 10*3/uL (ref 4.0–10.5)
nRBC: 0 % (ref 0.0–0.2)

## 2022-10-25 LAB — PHOSPHORUS: Phosphorus: 6.4 mg/dL — ABNORMAL HIGH (ref 2.5–4.6)

## 2022-10-25 LAB — ECHOCARDIOGRAM COMPLETE
Height: 68 in
S' Lateral: 3.7 cm
Weight: 3224.01 oz

## 2022-10-25 MED ORDER — FUROSEMIDE 20 MG PO TABS
20.0000 mg | ORAL_TABLET | Freq: Two times a day (BID) | ORAL | Status: DC
  Administered 2022-10-25 – 2022-10-26 (×2): 20 mg via ORAL
  Filled 2022-10-25 (×2): qty 1

## 2022-10-25 MED ORDER — INSULIN GLARGINE-YFGN 100 UNIT/ML ~~LOC~~ SOLN
5.0000 [IU] | Freq: Every day | SUBCUTANEOUS | Status: DC
  Administered 2022-10-25 – 2022-10-30 (×4): 5 [IU] via SUBCUTANEOUS
  Filled 2022-10-25 (×6): qty 0.05

## 2022-10-25 MED ORDER — INSULIN ASPART 100 UNIT/ML IJ SOLN
0.0000 [IU] | Freq: Three times a day (TID) | INTRAMUSCULAR | Status: DC
  Administered 2022-10-25 – 2022-10-26 (×2): 3 [IU] via SUBCUTANEOUS
  Administered 2022-10-26: 5 [IU] via SUBCUTANEOUS
  Administered 2022-10-26: 2 [IU] via SUBCUTANEOUS
  Administered 2022-10-27: 5 [IU] via SUBCUTANEOUS
  Administered 2022-10-27: 3 [IU] via SUBCUTANEOUS
  Administered 2022-10-28 (×2): 2 [IU] via SUBCUTANEOUS
  Administered 2022-10-28: 3 [IU] via SUBCUTANEOUS
  Administered 2022-10-29: 5 [IU] via SUBCUTANEOUS
  Administered 2022-10-29 – 2022-10-31 (×4): 2 [IU] via SUBCUTANEOUS
  Administered 2022-11-01 (×2): 3 [IU] via SUBCUTANEOUS
  Administered 2022-11-02 (×2): 2 [IU] via SUBCUTANEOUS
  Administered 2022-11-03 (×3): 3 [IU] via SUBCUTANEOUS
  Filled 2022-10-25 (×16): qty 1

## 2022-10-25 NOTE — Progress Notes (Addendum)
SUBJECTIVE: Patient is a 82 y.o. male with medical history significant of hypertension, hyperlipidemia, Dm2 with CKD stage 3, sick sinus syndrome, chronic CHFrEF, paroxysmal afib, hx of prostate cancer with recent admission for cellulitis, presented to ED on 10/22/22 complaining of worsening foot swelling and redness.   Vascular surgery consulted for severe PVD, will need femoral endarterectomies with stenting of the SFA and AK popliteal arteries.   Vitals:   10/25/22 0041 10/25/22 0303 10/25/22 0502 10/25/22 0826  BP:   118/73 108/78  Pulse:   84 94  Resp:   20 18  Temp:   97.8 F (36.6 C) 97.6 F (36.4 C)  TempSrc:    Oral  SpO2:   100% 98%  Weight: 92.3 kg 92.3 kg    Height:        Intake/Output Summary (Last 24 hours) at 10/25/2022 1402 Last data filed at 10/25/2022 0958 Gross per 24 hour  Intake 1349.94 ml  Output 600 ml  Net 749.94 ml    LABS: Basic Metabolic Panel: Recent Labs    10/24/22 0432 10/25/22 0435  NA 138 138  K 4.2 4.7  CL 100 98  CO2 27 26  GLUCOSE 87 236*  BUN 68* 79*  CREATININE 1.47* 1.69*  CALCIUM 8.1* 8.5*  MG 2.4 2.6*  PHOS 5.6* 6.4*   Liver Function Tests: Recent Labs    10/22/22 1406 10/23/22 0124  AST 38 34  ALT 32 31  ALKPHOS 57 54  BILITOT 1.1 1.0  PROT 6.7 6.6  ALBUMIN 3.4* 3.2*   No results for input(s): "LIPASE", "AMYLASE" in the last 72 hours. CBC: Recent Labs    10/22/22 1406 10/23/22 0124 10/24/22 0432 10/25/22 0435  WBC 6.7   < > 6.6 4.3  NEUTROABS 5.0  --  4.8  --   HGB 11.3*   < > 11.5* 12.1*  HCT 36.7*   < > 37.8* 39.9  MCV 100.5*   < > 101.3* 99.0  PLT 183   < > 165 171   < > = values in this interval not displayed.   Cardiac Enzymes: No results for input(s): "CKTOTAL", "CKMB", "CKMBINDEX", "TROPONINI" in the last 72 hours. BNP: Invalid input(s): "POCBNP" D-Dimer: No results for input(s): "DDIMER" in the last 72 hours. Hemoglobin A1C: No results for input(s): "HGBA1C" in the last 72 hours. Fasting Lipid  Panel: No results for input(s): "CHOL", "HDL", "LDLCALC", "TRIG", "CHOLHDL", "LDLDIRECT" in the last 72 hours. Thyroid Function Tests: No results for input(s): "TSH", "T4TOTAL", "T3FREE", "THYROIDAB" in the last 72 hours.  Invalid input(s): "FREET3" Anemia Panel: No results for input(s): "VITAMINB12", "FOLATE", "FERRITIN", "TIBC", "IRON", "RETICCTPCT" in the last 72 hours.   PHYSICAL EXAM General: Well developed, well nourished, in no acute distress HEENT:  Normocephalic and atramatic Neck:  No JVD.  Lungs: Clear bilaterally to auscultation and percussion. Heart: atrial fibrillation Abdomen: Bowel sounds are positive, abdomen soft and non-tender  Msk:  Back normal, normal gait. Normal strength and tone for age. Extremities: Edema 2+ pitting.   Neuro: Alert and oriented X 3. Psych:  Good affect, responds appropriately  TELEMETRY: atrial fibrillation, HR 93 bpm  ASSESSMENT AND PLAN: Patient currently resting in bed. Denies chest pain. Complains of fatigue and shortness of breath. Edema improving. Echo 10/24/22 EF 35-40%. Diuresing well on IV Lasix, kidney function decreasing. Will continue to monitor.   Principal Problem:   Cellulitis in diabetic foot ([place]) Active Problems:   Benign essential hypertension   Stage 3b chronic kidney disease (CKD) (  [place])   Type 2 diabetes mellitus with diabetic chronic kidney disease ([address])   Pure hypercholesterolemia   Squamous cell lung cancer, right ([facility])   Sick sinus syndrome ([facility])   PAF (paroxysmal atrial fibrillation) ([facility])   Normocytic anemia   Cellulitis   HFrEF (heart failure with reduced ejection fraction) ([place])   Peripheral edema    Redonna Wilbert, FNP-C 10/25/2022 2:02 PM

## 2022-10-25 NOTE — Plan of Care (Signed)
  Problem: Clinical Measurements: Goal: Will remain free from infection Outcome: Progressing   Problem: Clinical Measurements: Goal: Respiratory complications will improve Outcome: Progressing   Problem: Clinical Measurements: Goal: Cardiovascular complication will be avoided Outcome: Progressing   Problem: Activity: Goal: Risk for activity intolerance will decrease Outcome: Progressing   Problem: Elimination: Goal: Will not experience complications related to bowel motility Outcome: Progressing   Problem: Elimination: Goal: Will not experience complications related to urinary retention Outcome: Progressing   Problem: Pain Managment: Goal: General experience of comfort will improve Outcome: Progressing   Problem: Safety: Goal: Ability to remain free from injury will improve Outcome: Progressing

## 2022-10-25 NOTE — Evaluation (Signed)
Physical Therapy Evaluation Patient Details Name: Carlos Levine MRN: [id number] DOB: 26-Feb-1941 Today's Date: 10/25/2022  History of Present Illness  Carlos Levine is a 82 y.o. male with medical history significant of hypertension, hyperlipidemia, Dm2 with CKD stage 3, sick sinus syndrome, chronic CHFrEF, Pafib, Elevated troponin, hx of prostate cancer w recent admission for cellulitis apparently presents due to worsening foot swelling and redness.  Pt was told by cardiologist to come to ED.  Note that patient declined rehab during recent admission and went home.  No medication change since discharge other than couldn't get midodrine at the pharmacy.    Note prior ultrasound 10/01/2022 during recent admission negative for DVT  Clinical Impression  Patient presents in bed with nursing just finishing up bandaging both feet. He was somewhat confused- oriented to name and DOB. He presented today with BLE muscle weakness, immobility, impaired balance and decreased overall safety awareness. He was fatigued with transfer from bed to chair, denied any foot pain, and utilized hand held assist during transfer. Patient will benefit from skilled acute PT services to improve his LE strength and restore mobility for optimal function once discharged from Hospital. Unclear how much help he has at home so for now will recommend SNF with plan to reassess on subsequent visits.        Recommendations for follow up therapy are one component of a multi-disciplinary discharge planning process, led by the attending physician.  Recommendations may be updated based on patient status, additional functional criteria and insurance authorization.  Follow Up Recommendations Skilled nursing-short term rehab (<3 hours/day) Can patient physically be transported by private vehicle: No    Assistance Recommended at Discharge Frequent or constant Supervision/Assistance  Patient can return home with the following  A lot of help with  walking and/or transfers;A lot of help with bathing/dressing/bathroom;Assistance with cooking/housework;Direct supervision/assist for medications management;Direct supervision/assist for financial management;Assist for transportation;Help with stairs or ramp for entrance    Equipment Recommendations None recommended by PT  Recommendations for Other Services       Functional Status Assessment Patient has had a recent decline in their functional status and demonstrates the ability to make significant improvements in function in a reasonable and predictable amount of time.     Precautions / Restrictions Precautions Precautions: Fall Restrictions Weight Bearing Restrictions: No      Mobility  Bed Mobility Overal bed mobility: Needs Assistance Bed Mobility: Sit to Supine     Supine to sit: Min assist Sit to supine: Min assist        Transfers Overall transfer level: Needs assistance Equipment used: 1 person hand held assist   Sit to Stand: Min assist           General transfer comment: assist to steady upon standing    Ambulation/Gait Ambulation/Gait assistance: Min assist   Assistive device: Rolling walker (2 wheels) Gait Pattern/deviations: Step-to pattern, Narrow base of support, Trunk flexed       General Gait Details: Patient able to take a few steps from bed to chair including pivot with +1 2 hand assist  Stairs            Wheelchair Mobility    Modified Rankin (Stroke Patients Only)       Balance Overall balance assessment: Needs assistance Sitting-balance support: Feet supported, Bilateral upper extremity supported Sitting balance-Leahy Scale: Good     Standing balance support: Bilateral upper extremity supported, Reliant on assistive device for balance Standing balance-Leahy Scale: Fair Standing balance  comment: required UE support                             Pertinent Vitals/Pain Pain Assessment Pain Assessment: No/denies  pain Faces Pain Scale: No hurt Breathing: normal Negative Vocalization: none Facial Expression: smiling or inexpressive Body Language: relaxed Consolability: no need to console PAINAD Score: 0    Home Living Family/patient expects to be discharged to:: Private residence Living Arrangements: Spouse/significant other Available Help at Discharge: Family Type of Home: Mobile home Home Access: Stairs to enter Entrance Stairs-Rails: Right;Left;Can reach both Entrance Stairs-Number of Steps: 7   Home Layout: One level Home Equipment: Cane - single Barista (2 wheels)      Prior Function Prior Level of Function : Patient poor historian/Family not available                     Hand Dominance   Dominant Hand: Right    Extremity/Trunk Assessment   Upper Extremity Assessment Upper Extremity Assessment: Generalized weakness    Lower Extremity Assessment Lower Extremity Assessment: Generalized weakness    Cervical / Trunk Assessment Cervical / Trunk Assessment: Normal  Communication   Communication: No difficulties  Cognition Arousal/Alertness: Awake/alert Behavior During Therapy: Flat affect Overall Cognitive Status: No family/caregiver present to determine baseline cognitive functioning                                 General Comments: reporting "the end is near" throughout session. A&Ox4        General Comments      Exercises Other Exercises Other Exercises: Long sitting in reliner- Ankle pumps x 20 reps; hip abd x 15 reps each LE, heel slides x 20 reps   Assessment/Plan    PT Assessment Patient needs continued PT services  PT Problem List Decreased strength;Decreased activity tolerance;Decreased balance;Decreased knowledge of use of DME;Decreased mobility;Decreased safety awareness;Decreased skin integrity       PT Treatment Interventions DME instruction;Gait training;Functional mobility training;Therapeutic  activities;Therapeutic exercise;Balance training;Patient/family education;Stair training    PT Goals (Current goals can be found in the Care Plan section)  Acute Rehab PT Goals Patient Stated Goal: Not sure- Waiting for my wife PT Goal Formulation: Patient unable to participate in goal setting Time For Goal Achievement: 11/08/22 Potential to Achieve Goals: Fair    Frequency Min 2X/week     Co-evaluation               AM-PAC PT "6 Clicks" Mobility  Outcome Measure Help needed turning from your back to your side while in a flat bed without using bedrails?: A Little Help needed moving from lying on your back to sitting on the side of a flat bed without using bedrails?: A Little Help needed moving to and from a bed to a chair (including a wheelchair)?: A Little Help needed standing up from a chair using your arms (e.g., wheelchair or bedside chair)?: A Little Help needed to walk in hospital room?: A Lot Help needed climbing 3-5 steps with a railing? : Total 6 Click Score: 15    End of Session Equipment Utilized During Treatment: Gait belt Activity Tolerance: Patient tolerated treatment well Patient left: in bed;with call bell/phone within reach;with bed alarm set Nurse Communication: Mobility status PT Visit Diagnosis: Difficulty in walking, not elsewhere classified (R26.2);Muscle weakness (generalized) (M62.81);Unsteadiness on feet (R26.81)    Time: 1751-0258  PT Time Calculation (min) (ACUTE ONLY): 30 min   Charges:   PT Evaluation $PT Eval Moderate Complexity: 1 Mod PT Treatments $Therapeutic Exercise: 8-22 mins        Carlos Bowl, PT 10/25/2022, 1:44 PM

## 2022-10-25 NOTE — Progress Notes (Addendum)
PROGRESS NOTE    Carlos Levine  NGE:[id number] DOB: 1941-10-18 DOA: 10/22/2022 PCP: [name], MD  Outpatient Specialists: cardiology, oncology    Brief Narrative:   From admission h and p  Carlos Levine is a 82 y.o. male with medical history significant of hypertension, hyperlipidemia, Dm2 with CKD stage 3, sick sinus syndrome, chronic CHFrEF, Pafib, Elevated troponin, hx of prostate cancer w recent admission for cellulitis apparently presents due to worsening foot swelling and redness.  Pt was told by cardiologist to come to ED.  Note that patient declined rehab during recent admission and went home.  No medication change since discharge other than couldn't get midodrine at the pharmacy.   Note prior ultrasound 10/01/2022 during recent admission negative for DVT  Assessment & Plan:   Principal Problem:   Cellulitis in diabetic foot ([place]) Active Problems:   Stage 3b chronic kidney disease (CKD) (HCC)   PAF (paroxysmal atrial fibrillation) (HCC)   Benign essential hypertension   Type 2 diabetes mellitus with diabetic chronic kidney disease (HCC)   Pure hypercholesterolemia   Squamous cell lung cancer, right (HCC)   Sick sinus syndrome (HCC)   Normocytic anemia   Cellulitis   HFrEF (heart failure with reduced ejection fraction) ([place])   Peripheral edema  # Diabetic foot infection PAD contributing. Edema is a chronic problem last several months. PVLs negative for DVT in November.  X-ray no signs osteo, crp is wnl - continue vanc/ceftriaxone/flagyl - diuresis as below - wound care consulted - blood cultures ngtd, wound culture too young to read  # PAD Abnormal arterial dopplers. Vascular surgery consulted. Angiogram today shows severe b/l disease. Per vascular if we proceed will need b/l femoral endarterectomies w/ stenting of the SFA and above-knee popliteal arteries. Will require a lengthy surgery. Cardiology has been asked to weigh in regarding perioperative  risks/benefits. Vascular to discuss case w/ wife today   # HFrEF Recent EF 20%, recent admit for CHF. Edema has worsened since then. Edema improving. Cr rising, may need to back off diuresis - home lasix 20 oral bid - strict I/os - cardiology following - cont home farxiga given lower extremity infection  # Pleural effusion Chronic. Normal o2 sat on room air - diuresis as above  # A-fib, paroxysmal HR currently controlled - cont home apixaban, reduced dose per patient preference  # CAD No chest pain - cont home apixaban, pravastatin  # T2DM Glucose elevated - SSI, increase intensity, start semglee 5 qhs  # Neuropathy - cont home pregabalin  # CKD 3b Kidney function at baseline though cr rising a bit - monitor  # Sick sinus Has pacemaker  # Lung cancer In remission, surveilled by oncology    DVT prophylaxis: apixaban Code Status: dnr Family Communication: wife updated telephonically 1/6  Level of care: Telemetry Medical Status is: Inpatient Remains inpatient appropriate because: severity of illness Dispo: TBD. PT consult ordered    Consultants:  Cardiology, vascular surgery  Procedures: none  Antimicrobials:  See above    Subjective: No complaints  Objective: Vitals:   10/25/22 0041 10/25/22 0303 10/25/22 0502 10/25/22 0826  BP:   118/73 108/78  Pulse:   84 94  Resp:   20 18  Temp:   97.8 F (36.6 C) 97.6 F (36.4 C)  TempSrc:    Oral  SpO2:   100% 98%  Weight: 92.3 kg 92.3 kg    Height:        Intake/Output Summary (Last 24  hours) at 10/25/2022 1250 Last data filed at 10/25/2022 0958 Gross per 24 hour  Intake 1388.77 ml  Output 600 ml  Net 788.77 ml   Filed Weights   10/22/22 1404 10/25/22 0041 10/25/22 0303  Weight: 91.4 kg 92.3 kg 92.3 kg    Examination:  General exam: Appears calm and comfortable, confused after surgery Respiratory system: Clear to auscultation. Decreased breath sounds in bases Cardiovascular system: S1 & S2  heard, rr, distant heart sounds, systolic murmur Gastrointestinal system: Abdomen is nondistended, soft and nontender. No organomegaly or masses felt. Normal bowel sounds heard. Central nervous system: Alert and oriented. No focal neurological deficits. Extremities: pitting edema up legs, improving Skin: LE wounds dressed Psychiatry: Judgement and insight appear normal. Mood & affect appropriate.     Data Reviewed: I have personally reviewed following labs and imaging studies  CBC: Recent Labs  Lab 10/22/22 1406 10/23/22 0124 10/24/22 0432 10/25/22 0435  WBC 6.7 7.5 6.6 4.3  NEUTROABS 5.0  --  4.8  --   HGB 11.3* 11.3* 11.5* 12.1*  HCT 36.7* 37.1* 37.8* 39.9  MCV 100.5* 101.1* 101.3* 99.0  PLT 183 170 165 161   Basic Metabolic Panel: Recent Labs  Lab 10/22/22 1406 10/23/22 0124 10/24/22 0432 10/25/22 0435  NA 138 139 138 138  K 4.4 4.6 4.2 4.7  CL 97* 98 100 98  CO2 30 31 27 26   GLUCOSE 184* 131* 87 236*  BUN 76* 72* 68* 79*  CREATININE 1.41* 1.44* 1.47* 1.69*  CALCIUM 8.7* 8.4* 8.1* 8.5*  MG  --   --  2.4 2.6*  PHOS  --   --  5.6* 6.4*   GFR: Estimated Creatinine Clearance: 36.5 mL/min (A) (by C-G formula based on SCr of 1.69 mg/dL (H)). Liver Function Tests: Recent Labs  Lab 10/22/22 1406 10/23/22 0124  AST 38 34  ALT 32 31  ALKPHOS 57 54  BILITOT 1.1 1.0  PROT 6.7 6.6  ALBUMIN 3.4* 3.2*   No results for input(s): "LIPASE", "AMYLASE" in the last 168 hours. No results for input(s): "AMMONIA" in the last 168 hours. Coagulation Profile: No results for input(s): "INR", "PROTIME" in the last 168 hours. Cardiac Enzymes: No results for input(s): "CKTOTAL", "CKMB", "CKMBINDEX", "TROPONINI" in the last 168 hours. BNP (last 3 results) No results for input(s): "PROBNP" in the last 8760 hours. HbA1C: No results for input(s): "HGBA1C" in the last 72 hours. CBG: Recent Labs  Lab 10/24/22 1037 10/24/22 1627 10/24/22 2118 10/25/22 0825 10/25/22 1225   GLUCAP 99 154* 171* 167* 225*   Lipid Profile: No results for input(s): "CHOL", "HDL", "LDLCALC", "TRIG", "CHOLHDL", "LDLDIRECT" in the last 72 hours. Thyroid Function Tests: No results for input(s): "TSH", "T4TOTAL", "FREET4", "T3FREE", "THYROIDAB" in the last 72 hours. Anemia Panel: No results for input(s): "VITAMINB12", "FOLATE", "FERRITIN", "TIBC", "IRON", "RETICCTPCT" in the last 72 hours. Urine analysis:    Component Value Date/Time   COLORURINE YELLOW (A) 10/22/2022 2251   APPEARANCEUR CLEAR (A) 10/22/2022 2251   LABSPEC 1.012 10/22/2022 2251   PHURINE 5.0 10/22/2022 2251   GLUCOSEU 150 (A) 10/22/2022 2251   HGBUR NEGATIVE 10/22/2022 2251   BILIRUBINUR NEGATIVE 10/22/2022 2251   KETONESUR NEGATIVE 10/22/2022 2251   PROTEINUR NEGATIVE 10/22/2022 2251   NITRITE NEGATIVE 10/22/2022 2251   LEUKOCYTESUR NEGATIVE 10/22/2022 2251   Sepsis Labs: @LABRCNTIP (procalcitonin:4,lacticidven:4)  ) Recent Results (from the past 240 hour(s))  Culture, blood (Routine X 2) w Reflex to ID Panel     Status: None (Preliminary result)  Collection Time: 10/23/22  2:43 PM   Specimen: BLOOD  Result Value Ref Range Status   Specimen Description BLOOD BLOOD LEFT HAND  Final   Special Requests BOTTLES DRAWN AEROBIC AND ANAEROBIC BCLV  Final   Culture   Final    NO GROWTH 2 DAYS Performed at [facility], [address] [place], [place] [address]    Report Status PENDING  Incomplete  Culture, blood (Routine X 2) w Reflex to ID Panel     Status: None (Preliminary result)   Collection Time: 10/23/22  3:30 PM   Specimen: BLOOD  Result Value Ref Range Status   Specimen Description BLOOD BLOOD RIGHT HAND  Final   Special Requests BOTTLES DRAWN AEROBIC AND ANAEROBIC BCLV  Final   Culture   Final    NO GROWTH 2 DAYS Performed at [facility], [address] [place], [place] [address]    Report Status PENDING  Incomplete  Aerobic Culture w Gram Stain (superficial specimen)      Status: None (Preliminary result)   Collection Time: 10/23/22  4:44 PM   Specimen: Wound  Result Value Ref Range Status   Specimen Description   Final    WOUND Performed at [facility], [address] [place], [place] [address]    Special Requests   Final    FOOT Performed at [facility], [place] [employer], [place] [address]    Gram Stain   Final    FEW WBC PRESENT, PREDOMINANTLY PMN FEW GRAM POSITIVE COCCI IN PAIRS RARE BUDDING YEAST SEEN RARE GRAM NEGATIVE RODS    Culture   Final    TOO YOUNG TO READ Performed at [facility], [place] [address], [place], [place] [address]    Report Status PENDING  Incomplete         Radiology Studies: ECHOCARDIOGRAM COMPLETE  Result Date: 10/25/2022    ECHOCARDIOGRAM REPORT   Patient Name:   LADARREN STEINER Date of Exam: 10/24/2022 Medical Rec #:  [id number]      Height:       68.0 in Accession #:    [id number]     Weight:       201.5 lb Date of Birth:  1941/01/14      BSA:          2.050 m Patient Age:    71 years       BP:           92/72 mmHg Patient Gender: M              HR:           94 bpm. Exam Location:  [facility] Procedure: 2D Echo, Cardiac Doppler and Color Doppler Indications:     CHF--acute systolic J62.83  History:         Patient has prior history of Echocardiogram examinations, most                  recent 05/29/2019. CHF, COPD; Risk Factors:Hypertension.  Sonographer:     Sherrie Sport Referring Phys:  [id number] [place] TANG Diagnosing Phys: Neoma Laming  Sonographer Comments: Technically difficult study due to poor echo windows, no apical window and no parasternal window. Image acquisition challenging due to uncooperative patient. IMPRESSIONS  1. Left ventricular ejection fraction, by estimation, is 35 to 40%. The left ventricle has moderately decreased function. The left ventricle demonstrates global hypokinesis. The left ventricular internal cavity size was severely dilated. There  is mild left ventricular  hypertrophy. Left ventricular diastolic function could not be evaluated.  2. Right ventricular systolic function is moderately reduced. The right ventricular size is severely enlarged. Mildly increased right ventricular wall thickness.  3. Left atrial size was severely dilated.  4. Right atrial size was severely dilated.  5. The mitral valve was not well visualized. No evidence of mitral valve regurgitation.  6. The aortic valve was not well visualized. Aortic valve regurgitation is not visualized. Aortic valve sclerosis/calcification is present, without any evidence of aortic stenosis.  7. Aortic unable to see. FINDINGS  Left Ventricle: Left ventricular ejection fraction, by estimation, is 35 to 40%. The left ventricle has moderately decreased function. The left ventricle demonstrates global hypokinesis. The left ventricular internal cavity size was severely dilated. There is mild left ventricular hypertrophy. Left ventricular diastolic function could not be evaluated. Right Ventricle: The right ventricular size is severely enlarged. Mildly increased right ventricular wall thickness. Right ventricular systolic function is moderately reduced. Left Atrium: Left atrial size was severely dilated. Right Atrium: Right atrial size was severely dilated. Pericardium: The pericardium was not assessed. Mitral Valve: The mitral valve was not well visualized. No evidence of mitral valve regurgitation. Tricuspid Valve: The tricuspid valve is not assessed. Tricuspid valve regurgitation is not demonstrated. Aortic Valve: The aortic valve was not well visualized. Aortic valve regurgitation is not visualized. Aortic valve sclerosis/calcification is present, without any evidence of aortic stenosis. Pulmonic Valve: The pulmonic valve was not assessed. Pulmonic valve regurgitation is not visualized. Aorta: Unable to see. IAS/Shunts: The interatrial septum was not assessed.  LEFT VENTRICLE PLAX 2D LVIDd:         4.50 cm LVIDs:          3.70 cm LV PW:         1.20 cm LV IVS:        1.10 cm  LEFT ATRIUM         Index LA diam:    5.10 cm 2.49 cm/m Neoma Laming Electronically signed by Neoma Laming Signature Date/Time: 10/25/2022/9:08:14 AM    Final    PERIPHERAL VASCULAR CATHETERIZATION  Result Date: 10/24/2022 See surgical note for result.  US ARTERIAL LOWER EXTREMITY DUPLEX BILATERAL  Result Date: 10/23/2022 CLINICAL DATA:  82 year old male with a infection EXAM: NONINVASIVE PHYSIOLOGIC VASCULAR STUDY OF BILATERAL LOWER EXTREMITIES TECHNIQUE: Evaluation of both lower extremities was performed at rest, including calculation of ankle-brachial indices, directed duplex COMPARISON:  None Available. FINDINGS: Right ABI:  Not acquired Left ABI:  Not acquired Right Lower Extremity: Directed duplex of the right lower extremity demonstrates triphasic waveform of the common femoral artery, profunda femoris and proximal SFA. Distal SFA and popliteal artery demonstrate monophasic waveform. Posterior tibial artery and anterior tibial artery monophasic. No elevated velocity identified. Left Lower Extremity: Directed duplex of the left lower extremity demonstrates monophasic postobstructive waveform of the common femoral artery, profunda femoris, SFA, popliteal artery. Monophasic posterior tibial artery and anterior tibial artery. The imaged portions of the SFA are patent. No elevated velocities. IMPRESSION: Right: Directed duplex of the right lower extremity demonstrates evidence of multifocal SFA disease, contributing to monophasic popliteal and tibial artery waveforms. Left: Directed duplex of the left lower extremity demonstrates occlusive disease of the iliac system, with patency of the left femoropopliteal system, monophasic waveforms throughout. Signed, Dulcy Fanny. Nadene Rubins, RPVI [facility] Radiology Electronically Signed   By: Corrie Mckusick D.O.   On: 10/23/2022 14:12  Scheduled  Meds:  apixaban  2.5 mg Oral Q12H   vitamin C  500 mg Oral BID   feeding supplement  237 mL Oral TID BM   insulin aspart  0-15 Units Subcutaneous TID WC   insulin aspart  0-5 Units Subcutaneous QHS   insulin glargine-yfgn  5 Units Subcutaneous QHS   ipratropium-albuterol  3 mL Nebulization BID   multivitamin  1 tablet Oral Daily   pravastatin  80 mg Oral QHS   pregabalin  50 mg Oral BID   sodium chloride flush  3 mL Intravenous Q12H   sodium chloride flush  3 mL Intravenous Q12H   Continuous Infusions:  sodium chloride     sodium chloride     cefTRIAXone (ROCEPHIN)  IV Stopped (10/25/22 0028)   metronidazole 500 mg (10/25/22 1144)   vancomycin Stopped (10/25/22 0241)     LOS: 2 days     Desma Maxim, MD [facility]   If 7PM-7AM, please contact night-coverage [email or web address] Password [email or web address] 10/25/2022, 12:50 PM

## 2022-10-25 NOTE — Progress Notes (Signed)
[facility] Cardiology    SUBJECTIVE: Patient resting comfortably sleeping in bed no complaints this time   Vitals:   10/25/22 0041 10/25/22 0303 10/25/22 0502 10/25/22 0826  BP:   118/73 108/78  Pulse:   84 94  Resp:   20 18  Temp:   97.8 F (36.6 C) 97.6 F (36.4 C)  TempSrc:    Oral  SpO2:   100% 98%  Weight: 92.3 kg 92.3 kg    Height:         Intake/Output Summary (Last 24 hours) at 10/25/2022 1210 Last data filed at 10/25/2022 2956 Gross per 24 hour  Intake 1388.77 ml  Output 600 ml  Net 788.77 ml      PHYSICAL EXAM  General: Well developed, well nourished, in no acute distress HEENT:  Normocephalic and atramatic Neck:  No JVD.  Lungs: Clear bilaterally to auscultation and percussion. Heart: HRRR . Normal S1 and S2 without gallops or murmurs.  Abdomen: Bowel sounds are positive, abdomen soft and non-tender  Msk:  Back normal, normal gait. Normal strength and tone for age. Extremities: No clubbing, cyanosis or 3+ lower extremity ulcers bilaterally decreased pulses.   Neuro: Alert and oriented X 3. Psych:  Good affect, responds appropriately   LABS: Basic Metabolic Panel: Recent Labs    10/24/22 0432 10/25/22 0435  NA 138 138  K 4.2 4.7  CL 100 98  CO2 27 26  GLUCOSE 87 236*  BUN 68* 79*  CREATININE 1.47* 1.69*  CALCIUM 8.1* 8.5*  MG 2.4 2.6*  PHOS 5.6* 6.4*   Liver Function Tests: Recent Labs    10/22/22 1406 10/23/22 0124  AST 38 34  ALT 32 31  ALKPHOS 57 54  BILITOT 1.1 1.0  PROT 6.7 6.6  ALBUMIN 3.4* 3.2*   No results for input(s): "LIPASE", "AMYLASE" in the last 72 hours. CBC: Recent Labs    10/22/22 1406 10/23/22 0124 10/24/22 0432 10/25/22 0435  WBC 6.7   < > 6.6 4.3  NEUTROABS 5.0  --  4.8  --   HGB 11.3*   < > 11.5* 12.1*  HCT 36.7*   < > 37.8* 39.9  MCV 100.5*   < > 101.3* 99.0  PLT 183   < > 165 171   < > = values in this interval not displayed.   Cardiac Enzymes: No results for input(s): "CKTOTAL", "CKMB", "CKMBINDEX",  "TROPONINI" in the last 72 hours. BNP: Invalid input(s): "POCBNP" D-Dimer: No results for input(s): "DDIMER" in the last 72 hours. Hemoglobin A1C: No results for input(s): "HGBA1C" in the last 72 hours. Fasting Lipid Panel: No results for input(s): "CHOL", "HDL", "LDLCALC", "TRIG", "CHOLHDL", "LDLDIRECT" in the last 72 hours. Thyroid Function Tests: No results for input(s): "TSH", "T4TOTAL", "T3FREE", "THYROIDAB" in the last 72 hours.  Invalid input(s): "FREET3" Anemia Panel: No results for input(s): "VITAMINB12", "FOLATE", "FERRITIN", "TIBC", "IRON", "RETICCTPCT" in the last 72 hours.  ECHOCARDIOGRAM COMPLETE  Result Date: 10/25/2022    ECHOCARDIOGRAM REPORT   Patient Name:   Carlos Levine Date of Exam: 10/24/2022 Medical Rec #:  [id number]      Height:       68.0 in Accession #:    [id number]     Weight:       201.5 lb Date of Birth:  Jul 11, 1941      BSA:          2.050 m Patient Age:    82 years       BP:  92/72 mmHg Patient Gender: M              HR:           94 bpm. Exam Location:  [facility] Procedure: 2D Echo, Cardiac Doppler and Color Doppler Indications:     CHF--acute systolic H06.23  History:         Patient has prior history of Echocardiogram examinations, most                  recent 05/29/2019. CHF, COPD; Risk Factors:Hypertension.  Sonographer:     Sherrie Sport Referring Phys:  [id number] [place] TANG Diagnosing Phys: Neoma Laming  Sonographer Comments: Technically difficult study due to poor echo windows, no apical window and no parasternal window. Image acquisition challenging due to uncooperative patient. IMPRESSIONS  1. Left ventricular ejection fraction, by estimation, is 35 to 40%. The left ventricle has moderately decreased function. The left ventricle demonstrates global hypokinesis. The left ventricular internal cavity size was severely dilated. There is mild left ventricular hypertrophy. Left ventricular diastolic function could not be evaluated.  2. Right ventricular  systolic function is moderately reduced. The right ventricular size is severely enlarged. Mildly increased right ventricular wall thickness.  3. Left atrial size was severely dilated.  4. Right atrial size was severely dilated.  5. The mitral valve was not well visualized. No evidence of mitral valve regurgitation.  6. The aortic valve was not well visualized. Aortic valve regurgitation is not visualized. Aortic valve sclerosis/calcification is present, without any evidence of aortic stenosis.  7. Aortic unable to see. FINDINGS  Left Ventricle: Left ventricular ejection fraction, by estimation, is 35 to 40%. The left ventricle has moderately decreased function. The left ventricle demonstrates global hypokinesis. The left ventricular internal cavity size was severely dilated. There is mild left ventricular hypertrophy. Left ventricular diastolic function could not be evaluated. Right Ventricle: The right ventricular size is severely enlarged. Mildly increased right ventricular wall thickness. Right ventricular systolic function is moderately reduced. Left Atrium: Left atrial size was severely dilated. Right Atrium: Right atrial size was severely dilated. Pericardium: The pericardium was not assessed. Mitral Valve: The mitral valve was not well visualized. No evidence of mitral valve regurgitation. Tricuspid Valve: The tricuspid valve is not assessed. Tricuspid valve regurgitation is not demonstrated. Aortic Valve: The aortic valve was not well visualized. Aortic valve regurgitation is not visualized. Aortic valve sclerosis/calcification is present, without any evidence of aortic stenosis. Pulmonic Valve: The pulmonic valve was not assessed. Pulmonic valve regurgitation is not visualized. Aorta: Unable to see. IAS/Shunts: The interatrial septum was not assessed.  LEFT VENTRICLE PLAX 2D LVIDd:         4.50 cm LVIDs:         3.70 cm LV PW:         1.20 cm LV IVS:        1.10 cm  LEFT ATRIUM         Index LA diam:     5.10 cm 2.49 cm/m Neoma Laming Electronically signed by Neoma Laming Signature Date/Time: 10/25/2022/9:08:14 AM    Final    PERIPHERAL VASCULAR CATHETERIZATION  Result Date: 10/24/2022 See surgical note for result.  US ARTERIAL LOWER EXTREMITY DUPLEX BILATERAL  Result Date: 10/23/2022 CLINICAL DATA:  82 year old male with a infection EXAM: NONINVASIVE PHYSIOLOGIC VASCULAR STUDY OF BILATERAL LOWER EXTREMITIES TECHNIQUE: Evaluation of both lower extremities was performed at rest, including calculation of ankle-brachial indices, directed duplex COMPARISON:  None Available. FINDINGS: Right ABI:  Not acquired Left ABI:  Not acquired Right Lower Extremity: Directed duplex of the right lower extremity demonstrates triphasic waveform of the common femoral artery, profunda femoris and proximal SFA. Distal SFA and popliteal artery demonstrate monophasic waveform. Posterior tibial artery and anterior tibial artery monophasic. No elevated velocity identified. Left Lower Extremity: Directed duplex of the left lower extremity demonstrates monophasic postobstructive waveform of the common femoral artery, profunda femoris, SFA, popliteal artery. Monophasic posterior tibial artery and anterior tibial artery. The imaged portions of the SFA are patent. No elevated velocities. IMPRESSION: Right: Directed duplex of the right lower extremity demonstrates evidence of multifocal SFA disease, contributing to monophasic popliteal and tibial artery waveforms. Left: Directed duplex of the left lower extremity demonstrates occlusive disease of the iliac system, with patency of the left femoropopliteal system, monophasic waveforms throughout. Signed, Dulcy Fanny. Nadene Rubins, RPVI [facility] Inter[facility] Radiology Special[facility] Radiology Electronically Signed   By: Corrie Mckusick D.O.   On: 10/23/2022 14:12     Echo severely depressed left ventricular function EF around 20 to 25%  TELEMETRY: Normal sinus rhythm rate of  around 80 nonspecific ST-T wave changes:  ASSESSMENT AND PLAN:  Principal Problem:   Cellulitis in diabetic foot ([place]) Active Problems:   Benign essential hypertension   Stage 3b chronic kidney disease (CKD) (HCC)   Type 2 diabetes mellitus with diabetic chronic kidney disease (HCC)   Pure hypercholesterolemia   Squamous cell lung cancer, right (HCC)   Sick sinus syndrome (HCC)   PAF (paroxysmal atrial fibrillation) (HCC)   Normocytic anemia   Cellulitis   HFrEF (heart failure with reduced ejection fraction) (HCC)   Peripheral edema   Plan Continue current medical therapy for heart failure acute on chronic Agree with Lasix diuretic therapy for heart failure Echocardiogram for heart function wall motion Broad-spectrum antibiotic therapy for peripheral cellulitis Atrial fibrillation with controlled anticoagulation SSS s/p Micra PPM in place with normal function Severe lower extremity peripheral vascular disease probably requiring intervention as per vascular surgery Recommend conservative cardiology input at this point  Yolonda Kida, MD 10/25/2022 12:10 PM

## 2022-10-25 NOTE — Plan of Care (Signed)
  Problem: Coping: Goal: Ability to adjust to condition or change in health will improve Outcome: Progressing   

## 2022-10-25 NOTE — Progress Notes (Signed)
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  The former will require endarterectomy, with either general or regional anesthesia.  I explained this to Carlos Levine this this morning, that he needs to be optimized from a cardiac standpoint risk profile prior to proceeding with an open surgery.  Hopefully this will be completed so that this could be performed next week.

## 2022-10-26 DIAGNOSIS — L03119 Cellulitis of unspecified part of limb: Secondary | ICD-10-CM | POA: Diagnosis not present

## 2022-10-26 DIAGNOSIS — E11628 Type 2 diabetes mellitus with other skin complications: Secondary | ICD-10-CM | POA: Diagnosis not present

## 2022-10-26 LAB — APTT
aPTT: 41 seconds — ABNORMAL HIGH (ref 24–36)
aPTT: 175 seconds (ref 24–36)

## 2022-10-26 LAB — BASIC METABOLIC PANEL
Anion gap: 13 (ref 5–15)
BUN: 88 mg/dL — ABNORMAL HIGH (ref 8–23)
CO2: 27 mmol/L (ref 22–32)
Calcium: 8.3 mg/dL — ABNORMAL LOW (ref 8.9–10.3)
Chloride: 99 mmol/L (ref 98–111)
Creatinine, Ser: 1.68 mg/dL — ABNORMAL HIGH (ref 0.61–1.24)
GFR, Estimated: 40 mL/min — ABNORMAL LOW (ref >60)
Glucose, Bld: 106 mg/dL — ABNORMAL HIGH (ref 70–99)
Potassium: 4.8 mmol/L (ref 3.5–5.1)
Sodium: 139 mmol/L (ref 135–145)

## 2022-10-26 LAB — GLUCOSE, CAPILLARY
Glucose-Capillary: 147 mg/dL — ABNORMAL HIGH (ref 70–99)
Glucose-Capillary: 198 mg/dL — ABNORMAL HIGH (ref 70–99)
Glucose-Capillary: 225 mg/dL — ABNORMAL HIGH (ref 70–99)
Glucose-Capillary: 115 mg/dL — ABNORMAL HIGH (ref 70–99)

## 2022-10-26 LAB — PROTIME-INR
INR: 1.6 — ABNORMAL HIGH (ref 0.8–1.2)
Prothrombin Time: 19.3 seconds — ABNORMAL HIGH (ref 11.4–15.2)

## 2022-10-26 MED ORDER — HEPARIN (PORCINE) 25000 UT/250ML-% IV SOLN
1050.0000 [IU]/h | INTRAVENOUS | Status: DC
  Administered 2022-10-26: 1050 [IU]/h via INTRAVENOUS

## 2022-10-26 MED ORDER — SODIUM CHLORIDE 0.9 % IV SOLN
2.0000 g | Freq: Two times a day (BID) | INTRAVENOUS | Status: DC
  Administered 2022-10-26 – 2022-10-30 (×8): 2 g via INTRAVENOUS
  Filled 2022-10-26 (×2): qty 12.5
  Filled 2022-10-26 (×2): qty 2
  Filled 2022-10-26 (×4): qty 12.5
  Filled 2022-10-26: qty 2

## 2022-10-26 MED ORDER — SENNA 8.6 MG PO TABS
1.0000 | ORAL_TABLET | Freq: Every day | ORAL | Status: DC
  Administered 2022-10-26 – 2022-11-07 (×8): 8.6 mg via ORAL
  Filled 2022-10-26 (×9): qty 1

## 2022-10-26 MED ORDER — HEPARIN (PORCINE) 25000 UT/250ML-% IV SOLN: 1050.0000 [IU]/h | INTRAVENOUS | Status: DC

## 2022-10-26 MED ORDER — POLYETHYLENE GLYCOL 3350 17 G PO PACK
17.0000 g | PACK | Freq: Every day | ORAL | Status: DC
  Administered 2022-10-26 – 2022-10-28 (×3): 17 g via ORAL
  Filled 2022-10-26 (×5): qty 1

## 2022-10-26 MED ORDER — HEPARIN (PORCINE) 25000 UT/250ML-% IV SOLN
1300.0000 [IU]/h | INTRAVENOUS | Status: DC
  Administered 2022-10-26: 1300 [IU]/h via INTRAVENOUS
  Filled 2022-10-26: qty 250

## 2022-10-26 NOTE — Progress Notes (Signed)
[place] for heparin infusion initiation and monitoring Indication: atrial fibrillation  Allergies  Allergen Reactions   Contrast Media [Iodinated Contrast Media] Hives and Rash   Ace Inhibitors Other (See Comments)    Unknown reaction type   Latex Rash    Patient Measurements: Height: 5\' 8"  (172.7 cm) Weight: 95 kg (209 lb 7 oz) IBW/kg (Calculated) : 68.4 Heparin Dosing Weight: 87.3 kg  Vital Signs: Temp: 97.5 F (36.4 C) (01/07 1522) BP: 109/80 (01/07 1522) Pulse Rate: 78 (01/07 1522)  Labs: Recent Labs    10/24/22 0432 10/25/22 0435 10/26/22 0428 10/26/22 0913  HGB 11.5* 12.1*  --   --   HCT 37.8* 39.9  --   --   PLT 165 171  --   --   APTT  --   --   --  41*  LABPROT  --   --   --  19.3*  INR  --   --   --  1.6*  CREATININE 1.47* 1.69* 1.68*  --      Estimated Creatinine Clearance: 37.2 mL/min (A) (by C-G formula based on SCr of 1.68 mg/dL (H)).   Medical History: Past Medical History:  Diagnosis Date   Arrhythmia    PAF   Arthritis    Cancer ([place])    prostate   CHF (congestive heart failure) (HCC)    Chronic kidney disease    COPD (chronic obstructive pulmonary disease) (HCC)    Coronary artery disease    Diabetes mellitus without complication (HCC)    Dyspnea    with minimal exertion   Dysrhythmia    history of SVT   History of kidney stones    History of radiation therapy    lung   Hyperkalemia 11/19/2017   Hyperlipidemia    Hypertension    Peripheral vascular disease (HCC)    Prostate cancer (HCC)    Squamous cell lung cancer ([place]) 04/2020   also has history of skin cancer    Medications:  Scheduled:   vitamin C  500 mg Oral BID   feeding supplement  237 mL Oral TID BM   insulin aspart  0-15 Units Subcutaneous TID WC   insulin aspart  0-5 Units Subcutaneous QHS   insulin glargine-yfgn  5 Units Subcutaneous QHS   ipratropium-albuterol  3 mL Nebulization BID   multivitamin  1 tablet Oral  Daily   polyethylene glycol  17 g Oral Daily   pravastatin  80 mg Oral QHS   pregabalin  50 mg Oral BID   senna  1 tablet Oral Daily   sodium chloride flush  3 mL Intravenous Q12H    Assessment: 82 y/o male with h/o CHF, COPD, DM, HLD, HTN, CAD, pacemaker, prostate cancer, SCLC s/p XRT, CKD III and blisters on bilateral feet who is admitted with foot infection and cellulitis. Patient is being transitioned to heparin from Eliquis after speaking with primary service to facilitate option of surgery   apixaban last dose 10/25/22 2157  Goal of Therapy:  aPTT 66 - 102  seconds Monitor platelets by anticoagulation protocol: Yes   1/7 @ 1813: aPTT >130 sec, supratherapeutic @ 1300 u/hr  Plan:  ---Hold heparin drip x 1 hour ---Reduce heparin rate to 1050 units/hr ---Check aPTT 8 hours after rate change (check anti-Xa level once daily for correlation)  ---Continue to monitor H&H and platelets  Lorin Picket, PharmD 10/26/2022,7:11 PM

## 2022-10-26 NOTE — Progress Notes (Signed)
Postprocedural day #2 from diagnostic angiography:  Diuresing, but renal function slightly worse, GFR 40, unchanged.  His comorbidities remains substantial, many of which cannot be modified significantly.  Will transition to heparin from Eliquis after speaking with primary service to facilitate option of surgery, potentially with anesthetic regional block versus general anesthesia.  Timing of surgery would be dictated by any clinical gains made in his performance status over the next few days

## 2022-10-26 NOTE — Progress Notes (Signed)
Mobility Specialist - Progress Note     10/26/22 1719  Mobility  Activity Ambulated with assistance in room;Stood at bedside  Level of Assistance Minimal assist, patient does 75% or more  Assistive Device Front wheel walker  Distance Ambulated (ft) 3 ft  Activity Response Tolerated well  Mobility Referral Yes  $Mobility charge 1 Mobility   Pt sitting up in recliner on RA upon entry. Pt STS and ambulates to bed (3-4 step pivot transfer) MinA with RW. Pt left in bed with needs in reach and bed alarm activated.   Loma Sender Mobility Specialist 10/26/22, 5:26 PM

## 2022-10-26 NOTE — Consult Note (Signed)
Pharmacy Antibiotic Note  Carlos Levine is a 82 y.o. male admitted on 10/22/2022 with diabetic foot infection.  Pharmacy has been consulted for cefepime dosing.  Plan: Start Cefepime 2 grams IV every 12 hours Continue vancomycin 750 mg IV every 24 hours Follow renal function for dosing adjustments Vancomycin levels as clinically appropriate.  Height: 5\' 8"  (172.7 cm) Weight: 95 kg (209 lb 7 oz) IBW/kg (Calculated) : 68.4  Temp (24hrs), Avg:97.1 F (36.2 C), Min:96.5 F (35.8 C), Max:97.5 F (36.4 C)  Recent Labs  Lab 10/22/22 1406 10/23/22 0124 10/24/22 0432 10/25/22 0435 10/26/22 0428  WBC 6.7 7.5 6.6 4.3  --   CREATININE 1.41* 1.44* 1.47* 1.69* 1.68*  LATICACIDVEN 1.4  --   --   --   --     Estimated Creatinine Clearance: 37.2 mL/min (A) (by C-G formula based on SCr of 1.68 mg/dL (H)).    Allergies  Allergen Reactions   Contrast Media [Iodinated Contrast Media] Hives and Rash   Ace Inhibitors Other (See Comments)    Unknown reaction type   Latex Rash    Antimicrobials this admission: Vancomycin 1/3 >>  ceftriaxone 1/3 >>1/7 Cefepime 1/7>>    Dose adjustments this admission: N/A  Microbiology results: 1/4 BCx: ngtd 1/4 WCx: enterobacter cloacae (R - cefazolin)   Thank you for allowing pharmacy to be a part of this patient's care.  Lorin Picket, PharmD 10/26/2022 3:03 PM

## 2022-10-26 NOTE — Plan of Care (Signed)
  Problem: Coping: Goal: Ability to adjust to condition or change in health will improve Outcome: Progressing   

## 2022-10-26 NOTE — Progress Notes (Signed)
[place] for heparin infusion initiation and monitoring Indication: atrial fibrillation  Allergies  Allergen Reactions   Contrast Media [Iodinated Contrast Media] Hives and Rash   Ace Inhibitors Other (See Comments)    Unknown reaction type   Latex Rash    Patient Measurements: Height: 5\' 8"  (172.7 cm) Weight: 95 kg (209 lb 7 oz) IBW/kg (Calculated) : 68.4 Heparin Dosing Weight: 87.3 kg  Vital Signs: Temp: 97.5 F (36.4 C) (01/07 0816) Temp Source: Axillary (01/07 0434) BP: 106/86 (01/07 0816) Pulse Rate: 62 (01/07 0816)  Labs: Recent Labs    10/24/22 0432 10/25/22 0435 10/26/22 0428  HGB 11.5* 12.1*  --   HCT 37.8* 39.9  --   PLT 165 171  --   CREATININE 1.47* 1.69* 1.68*    Estimated Creatinine Clearance: 37.2 mL/min (A) (by C-G formula based on SCr of 1.68 mg/dL (H)).   Medical History: Past Medical History:  Diagnosis Date   Arrhythmia    PAF   Arthritis    Cancer (HCC)    prostate   CHF (congestive heart failure) (HCC)    Chronic kidney disease    COPD (chronic obstructive pulmonary disease) (HCC)    Coronary artery disease    Diabetes mellitus without complication (HCC)    Dyspnea    with minimal exertion   Dysrhythmia    history of SVT   History of kidney stones    History of radiation therapy    lung   Hyperkalemia 11/19/2017   Hyperlipidemia    Hypertension    Peripheral vascular disease (HCC)    Prostate cancer (HCC)    Squamous cell lung cancer ([place]) 04/2020   also has history of skin cancer    Medications:  Scheduled:   vitamin C  500 mg Oral BID   feeding supplement  237 mL Oral TID BM   furosemide  20 mg Oral BID   insulin aspart  0-15 Units Subcutaneous TID WC   insulin aspart  0-5 Units Subcutaneous QHS   insulin glargine-yfgn  5 Units Subcutaneous QHS   ipratropium-albuterol  3 mL Nebulization BID   multivitamin  1 tablet Oral Daily   pravastatin  80 mg Oral QHS   pregabalin  50 mg  Oral BID   sodium chloride flush  3 mL Intravenous Q12H    Assessment: 82 y/o male with h/o CHF, COPD, DM, HLD, HTN, CAD, pacemaker, prostate cancer, SCLC s/p XRT, CKD III and blisters on bilateral feet who is admitted with foot infection and cellulitis. Patient is being transitioned to heparin from Eliquis after speaking with primary service to facilitate option of surgery   apixaban last dose 10/25/22 2157  Goal of Therapy:  aPTT 66 - 102  seconds Monitor platelets by anticoagulation protocol: Yes   Plan:  ---Start heparin infusion at 1300 units/hr (no bolus required d/t recent apixaban administration) ---Check aPTT in 8 hours (check anti-Xa level once daily for correlation)  ---Continue to monitor H&H and platelets  Dallie Piles 10/26/2022,8:33 AM

## 2022-10-26 NOTE — Progress Notes (Signed)
SUBJECTIVE: Patient is a 82 y.o. male with medical history significant of hypertension, hyperlipidemia, Dm2 with CKD stage 3, sick sinus syndrome, chronic CHFrEF, paroxysmal afib, hx of prostate cancer with recent admission for cellulitis, presented to ED on 10/22/22 complaining of worsening foot swelling and redness.    Vascular surgery consulted for severe PVD, will need femoral endarterectomies with stenting of the SFA and AK popliteal arteries.   Eliquis changed to Heparin in anticipation of surgery.    Vitals:   10/25/22 1620 10/25/22 1943 10/26/22 0434 10/26/22 0816  BP: 111/79 111/84 106/76 106/86  Pulse: 74 88 75 62  Resp: 18 20 16 17   Temp: (!) 97.4 F (36.3 C) (!) 97.1 F (36.2 C) (!) 96.5 F (35.8 C) (!) 97.5 F (36.4 C)  TempSrc:  Oral Axillary   SpO2: 98% 96% 95% 93%  Weight:   95 kg   Height:        Intake/Output Summary (Last 24 hours) at 10/26/2022 1220 Last data filed at 10/26/2022 0857 Gross per 24 hour  Intake 900.67 ml  Output 1000 ml  Net -99.33 ml    LABS: Basic Metabolic Panel: Recent Labs    10/24/22 0432 10/25/22 0435 10/26/22 0428  NA 138 138 139  K 4.2 4.7 4.8  CL 100 98 99  CO2 27 26 27   GLUCOSE 87 236* 106*  BUN 68* 79* 88*  CREATININE 1.47* 1.69* 1.68*  CALCIUM 8.1* 8.5* 8.3*  MG 2.4 2.6*  --   PHOS 5.6* 6.4*  --    Liver Function Tests: No results for input(s): "AST", "ALT", "ALKPHOS", "BILITOT", "PROT", "ALBUMIN" in the last 72 hours. No results for input(s): "LIPASE", "AMYLASE" in the last 72 hours. CBC: Recent Labs    10/24/22 0432 10/25/22 0435  WBC 6.6 4.3  NEUTROABS 4.8  --   HGB 11.5* 12.1*  HCT 37.8* 39.9  MCV 101.3* 99.0  PLT 165 171   Cardiac Enzymes: No results for input(s): "CKTOTAL", "CKMB", "CKMBINDEX", "TROPONINI" in the last 72 hours. BNP: Invalid input(s): "POCBNP" D-Dimer: No results for input(s): "DDIMER" in the last 72 hours. Hemoglobin A1C: No results for input(s): "HGBA1C" in the last 72  hours. Fasting Lipid Panel: No results for input(s): "CHOL", "HDL", "LDLCALC", "TRIG", "CHOLHDL", "LDLDIRECT" in the last 72 hours. Thyroid Function Tests: No results for input(s): "TSH", "T4TOTAL", "T3FREE", "THYROIDAB" in the last 72 hours.  Invalid input(s): "FREET3" Anemia Panel: No results for input(s): "VITAMINB12", "FOLATE", "FERRITIN", "TIBC", "IRON", "RETICCTPCT" in the last 72 hours.   PHYSICAL EXAM General: Well developed, well nourished, in no acute distress HEENT:  Normocephalic and atramatic Neck:  No JVD.  Lungs: Clear bilaterally to auscultation and percussion. Heart: HRRR . Normal S1 and S2 without gallops or murmurs.  Abdomen: Bowel sounds are positive, abdomen soft and non-tender  Msk:  Back normal, normal gait. Normal strength and tone for age. Extremities: edema.   Neuro: Alert and oriented X 3. Psych:  Good affect, responds appropriately  TELEMETRY: atrial fibrillation, HR 93-100  ASSESSMENT AND PLAN: Patient resting comfortably in bed. Denies chest pain. Shortness of breath on exertion, dizziness with activity. Vascular surgery requesting cardiac clearance for surgery for PVD. Will continue to follow.   Principal Problem:   Cellulitis in diabetic foot ([place]) Active Problems:   Benign essential hypertension   Stage 3b chronic kidney disease (CKD) (HCC)   Type 2 diabetes mellitus with diabetic chronic kidney disease ([place])   Pure hypercholesterolemia   Squamous cell lung cancer, right ([place])  Sick sinus syndrome (HCC)   PAF (paroxysmal atrial fibrillation) (HCC)   Normocytic anemia   Cellulitis   HFrEF (heart failure with reduced ejection fraction) (HCC)   Peripheral edema    Alcario Tinkey, FNP-C 10/26/2022 12:20 PM

## 2022-10-26 NOTE — Progress Notes (Addendum)
Vascular wanted surgical clearance for endarterectomy of bilateral femoral and superficial femoral arteries under general anesthesia/local block.  Advise proceeding with surgery ideally under local anesthesia.  Patient has been optimized as much as possible with history of HFrEF.  Transition to heparin from Eliquis has already been done.  Advise proceeding with surgery.

## 2022-10-26 NOTE — Progress Notes (Addendum)
PROGRESS NOTE    Carlos Levine  OVZ:[id number] DOB: [birth year] DOA: 10/22/2022 PCP: [name], MD  Outpatient Specialists: cardiology, oncology    Brief Narrative:   From admission h and p  Carlos Levine is a 82 y.o. male with medical history significant of hypertension, hyperlipidemia, Dm2 with CKD stage 3, sick sinus syndrome, chronic CHFrEF, Pafib, Elevated troponin, hx of prostate cancer w recent admission for cellulitis apparently presents due to worsening foot swelling and redness.  Pt was told by cardiologist to come to ED.  Note that patient declined rehab during recent admission and went home.  No medication change since discharge other than couldn't get midodrine at the pharmacy.   Note prior ultrasound 10/01/2022 during recent admission negative for DVT  Assessment & Plan:   Principal Problem:   Cellulitis in diabetic foot ([place]) Active Problems:   Stage 3b chronic kidney disease (CKD) (HCC)   PAF (paroxysmal atrial fibrillation) (HCC)   Benign essential hypertension   Type 2 diabetes mellitus with diabetic chronic kidney disease (HCC)   Pure hypercholesterolemia   Squamous cell lung cancer, right (HCC)   Sick sinus syndrome (HCC)   Normocytic anemia   Cellulitis   HFrEF (heart failure with reduced ejection fraction) ([place])   Peripheral edema  # Diabetic foot infection PAD contributing. Edema is a chronic problem last several months. PVLs negative for DVT in November.  X-ray no signs osteo, crp is wnl. Infection appears to be improving. blood cultures growing enterobacter cloacae resistant to cefazolin, not yet finalized  - continue vanc/flagyl, switch ceftriaxone to cefepime - diuresis as below - wound care consulted   # PAD Abnormal arterial dopplers. Vascular surgery consulted. Angiogram shows severe b/l disease. Per vascular if we proceed will need b/l femoral endarterectomies w/ stenting of the SFA and above-knee popliteal arteries. Will require a  lengthy surgery. Cardiology has been asked to weigh in regarding perioperative risks/benefits, today they say he is optimized to the extent he can be, cleared to proceed w/ procedure.  # HFrEF Recent EF 20%, recent admit for CHF. Edema has worsened since then. Edema improving. Cr and BUN have risen - will hold lasix given up-trending cr - strict I/os - cardiology following - cont to hold home farxiga given lower extremity infection  # Pleural effusion Chronic. Normal o2 sat on room air - diuresis as above  # A-fib, paroxysmal HR currently controlled - holding apixaban, starting heparin, in anticipation of vascular surgery  # CAD No chest pain - cont home statin  # T2DM Glucose mild elevation - continue SSI, semglee 5 qhs  # Neuropathy - cont home pregabalin  # CKD 3b Kidney function at baseline though cr rising a bit - pausing lasix  # Sick sinus Has pacemaker  # Lung cancer In remission, surveilled by oncology    DVT prophylaxis: heparin Code Status: dnr Family Communication: wife updated telephonically 1/6. No answer today, message left  Level of care: Telemetry Medical Status is: Inpatient Remains inpatient appropriate because: severity of illness Dispo: TBD. PT consult ordered    Consultants:  Cardiology, vascular surgery  Procedures: angiogram  Antimicrobials:  See above    Subjective: No complaints  Objective: Vitals:   10/25/22 1620 10/25/22 1943 10/26/22 0434 10/26/22 0816  BP: 111/79 111/84 106/76 106/86  Pulse: 74 88 75 62  Resp: 18 20 16 17   Temp: (!) 97.4 F (36.3 C) (!) 97.1 F (36.2 C) (!) 96.5 F (35.8 C) (!) 97.5 F (  36.4 C)  TempSrc:  Oral Axillary   SpO2: 98% 96% 95% 93%  Weight:   95 kg   Height:        Intake/Output Summary (Last 24 hours) at 10/26/2022 1429 Last data filed at 10/26/2022 0857 Gross per 24 hour  Intake 900.67 ml  Output 1000 ml  Net -99.33 ml   Filed Weights   10/25/22 0041 10/25/22 0303 10/26/22  0434  Weight: 92.3 kg 92.3 kg 95 kg    Examination:  General exam: Appears calm and comfortable, confused after surgery Respiratory system: Clear to auscultation. Decreased breath sounds in bases Cardiovascular system: S1 & S2 heard, rr, distant heart sounds, systolic murmur Gastrointestinal system: Abdomen is nondistended, soft and nontender. No organomegaly or masses felt. Normal bowel sounds heard. Central nervous system: Alert and oriented. No focal neurological deficits. Extremities: pitting edema up legs, improving Skin: LE wounds dressed Psychiatry: Judgement and insight appear normal. Mood & affect appropriate.     Data Reviewed: I have personally reviewed following labs and imaging studies  CBC: Recent Labs  Lab 10/22/22 1406 10/23/22 0124 10/24/22 0432 10/25/22 0435  WBC 6.7 7.5 6.6 4.3  NEUTROABS 5.0  --  4.8  --   HGB 11.3* 11.3* 11.5* 12.1*  HCT 36.7* 37.1* 37.8* 39.9  MCV 100.5* 101.1* 101.3* 99.0  PLT 183 170 165 626   Basic Metabolic Panel: Recent Labs  Lab 10/22/22 1406 10/23/22 0124 10/24/22 0432 10/25/22 0435 10/26/22 0428  NA 138 139 138 138 139  K 4.4 4.6 4.2 4.7 4.8  CL 97* 98 100 98 99  CO2 30 31 27 26 27   GLUCOSE 184* 131* 87 236* 106*  BUN 76* 72* 68* 79* 88*  CREATININE 1.41* 1.44* 1.47* 1.69* 1.68*  CALCIUM 8.7* 8.4* 8.1* 8.5* 8.3*  MG  --   --  2.4 2.6*  --   PHOS  --   --  5.6* 6.4*  --    GFR: Estimated Creatinine Clearance: 37.2 mL/min (A) (by C-G formula based on SCr of 1.68 mg/dL (H)). Liver Function Tests: Recent Labs  Lab 10/22/22 1406 10/23/22 0124  AST 38 34  ALT 32 31  ALKPHOS 57 54  BILITOT 1.1 1.0  PROT 6.7 6.6  ALBUMIN 3.4* 3.2*   No results for input(s): "LIPASE", "AMYLASE" in the last 168 hours. No results for input(s): "AMMONIA" in the last 168 hours. Coagulation Profile: Recent Labs  Lab 10/26/22 0913  INR 1.6*   Cardiac Enzymes: No results for input(s): "CKTOTAL", "CKMB", "CKMBINDEX", "TROPONINI"  in the last 168 hours. BNP (last 3 results) No results for input(s): "PROBNP" in the last 8760 hours. HbA1C: No results for input(s): "HGBA1C" in the last 72 hours. CBG: Recent Labs  Lab 10/25/22 1225 10/25/22 1701 10/25/22 2108 10/26/22 0818 10/26/22 1233  GLUCAP 225* 193* 294* 147* 198*   Lipid Profile: No results for input(s): "CHOL", "HDL", "LDLCALC", "TRIG", "CHOLHDL", "LDLDIRECT" in the last 72 hours. Thyroid Function Tests: No results for input(s): "TSH", "T4TOTAL", "FREET4", "T3FREE", "THYROIDAB" in the last 72 hours. Anemia Panel: No results for input(s): "VITAMINB12", "FOLATE", "FERRITIN", "TIBC", "IRON", "RETICCTPCT" in the last 72 hours. Urine analysis:    Component Value Date/Time   COLORURINE YELLOW (A) 10/22/2022 2251   APPEARANCEUR CLEAR (A) 10/22/2022 2251   LABSPEC 1.012 10/22/2022 2251   PHURINE 5.0 10/22/2022 2251   GLUCOSEU 150 (A) 10/22/2022 2251   HGBUR NEGATIVE 10/22/2022 2251   BILIRUBINUR NEGATIVE 10/22/2022 2251   KETONESUR NEGATIVE 10/22/2022 2251  PROTEINUR NEGATIVE 10/22/2022 2251   NITRITE NEGATIVE 10/22/2022 2251   LEUKOCYTESUR NEGATIVE 10/22/2022 2251   Sepsis Labs: @LABRCNTIP (procalcitonin:4,lacticidven:4)  ) Recent Results (from the past 240 hour(s))  Culture, blood (Routine X 2) w Reflex to ID Panel     Status: None (Preliminary result)   Collection Time: 10/23/22  2:43 PM   Specimen: BLOOD  Result Value Ref Range Status   Specimen Description BLOOD BLOOD LEFT HAND  Final   Special Requests BOTTLES DRAWN AEROBIC AND ANAEROBIC BCLV  Final   Culture   Final    NO GROWTH 3 DAYS Performed at [facility], [address], [place], [place] [address]    Report Status PENDING  Incomplete  Culture, blood (Routine X 2) w Reflex to ID Panel     Status: None (Preliminary result)   Collection Time: 10/23/22  3:30 PM   Specimen: BLOOD  Result Value Ref Range Status   Specimen Description BLOOD BLOOD RIGHT HAND  Final   Special  Requests BOTTLES DRAWN AEROBIC AND ANAEROBIC BCLV  Final   Culture   Final    NO GROWTH 3 DAYS Performed at [facility], [address], [place], [place] [address]    Report Status PENDING  Incomplete  Aerobic Culture w Gram Stain (superficial specimen)     Status: None (Preliminary result)   Collection Time: 10/23/22  4:44 PM   Specimen: Wound  Result Value Ref Range Status   Specimen Description   Final    WOUND Performed at [facility], [address] [place], [place] [address]    Special Requests   Final    FOOT Performed at [facility], [address] [place], [place] [address]    Gram Stain   Final    FEW WBC PRESENT, PREDOMINANTLY PMN FEW GRAM POSITIVE COCCI IN PAIRS RARE BUDDING YEAST SEEN RARE GRAM NEGATIVE RODS    Culture   Final    MODERATE ENTEROBACTER CLOACAE MODERATE STENOTROPHOMONAS MALTOPHILIA CULTURE REINCUBATED FOR BETTER GROWTH Performed at [facility], [place] [address] [place], [place] [address]    Report Status PENDING  Incomplete   Organism ID, Bacteria ENTEROBACTER CLOACAE  Final      Susceptibility   Enterobacter cloacae - MIC*    CEFAZOLIN >=64 RESISTANT Resistant     CEFEPIME <=0.12 SENSITIVE Sensitive     CEFTAZIDIME <=1 SENSITIVE Sensitive     CIPROFLOXACIN <=0.25 SENSITIVE Sensitive     GENTAMICIN <=1 SENSITIVE Sensitive     IMIPENEM 0.5 SENSITIVE Sensitive     TRIMETH/SULFA <=20 SENSITIVE Sensitive     PIP/TAZO <=4 SENSITIVE Sensitive     * MODERATE ENTEROBACTER CLOACAE         Radiology Studies: No results found.      Scheduled Meds:  vitamin C  500 mg Oral BID   feeding supplement  237 mL Oral TID BM   insulin aspart  0-15 Units Subcutaneous TID WC   insulin aspart  0-5 Units Subcutaneous QHS   insulin glargine-yfgn  5 Units Subcutaneous QHS   ipratropium-albuterol  3 mL Nebulization BID   multivitamin  1 tablet Oral Daily   polyethylene glycol  17 g Oral Daily   pravastatin  80  mg Oral QHS   pregabalin  50 mg Oral BID   sodium chloride flush  3 mL Intravenous Q12H   Continuous Infusions:  sodium chloride     cefTRIAXone (ROCEPHIN)  IV Stopped (10/25/22 2226)   heparin 1,300  Units/hr (10/26/22 6701)   metronidazole 500 mg (10/26/22 1130)   vancomycin Stopped (10/26/22 0157)     LOS: 3 days     Desma Maxim, MD [facility]   If 7PM-7AM, please contact night-coverage [email or web address] Password [facility] 10/26/2022, 2:29 PM

## 2022-10-27 DIAGNOSIS — E11628 Type 2 diabetes mellitus with other skin complications: Secondary | ICD-10-CM | POA: Diagnosis not present

## 2022-10-27 DIAGNOSIS — L03119 Cellulitis of unspecified part of limb: Secondary | ICD-10-CM | POA: Diagnosis not present

## 2022-10-27 LAB — GLUCOSE, CAPILLARY
Glucose-Capillary: 152 mg/dL — ABNORMAL HIGH (ref 70–99)
Glucose-Capillary: 206 mg/dL — ABNORMAL HIGH (ref 70–99)
Glucose-Capillary: 85 mg/dL (ref 70–99)
Glucose-Capillary: 90 mg/dL (ref 70–99)

## 2022-10-27 LAB — BASIC METABOLIC PANEL
Anion gap: 11 (ref 5–15)
BUN: 88 mg/dL — ABNORMAL HIGH (ref 8–23)
CO2: 26 mmol/L (ref 22–32)
Calcium: 8.2 mg/dL — ABNORMAL LOW (ref 8.9–10.3)
Chloride: 102 mmol/L (ref 98–111)
Creatinine, Ser: 1.6 mg/dL — ABNORMAL HIGH (ref 0.61–1.24)
GFR, Estimated: 42 mL/min — ABNORMAL LOW (ref >60)
Glucose, Bld: 162 mg/dL — ABNORMAL HIGH (ref 70–99)
Potassium: 4.7 mmol/L (ref 3.5–5.1)
Sodium: 139 mmol/L (ref 135–145)

## 2022-10-27 LAB — HEPARIN LEVEL (UNFRACTIONATED): Heparin Unfractionated: >1.1 IU/mL — ABNORMAL HIGH (ref 0.30–0.70)

## 2022-10-27 LAB — APTT
aPTT: 145 seconds — ABNORMAL HIGH (ref 24–36)
aPTT: 81 seconds — ABNORMAL HIGH (ref 24–36)
aPTT: 81 seconds — ABNORMAL HIGH (ref 24–36)

## 2022-10-27 MED ORDER — IPRATROPIUM-ALBUTEROL 0.5-2.5 (3) MG/3ML IN SOLN: 3.0000 mL | Freq: Four times a day (QID) | RESPIRATORY_TRACT | Status: DC | PRN

## 2022-10-27 MED ORDER — HEPARIN (PORCINE) 25000 UT/250ML-% IV SOLN
750.0000 [IU]/h | INTRAVENOUS | Status: DC
  Administered 2022-10-27 – 2022-10-30 (×3): 750 [IU]/h via INTRAVENOUS
  Filled 2022-10-27 (×3): qty 250

## 2022-10-27 NOTE — NC FL2 (Signed)
[place] LEVEL OF CARE FORM     IDENTIFICATION  Patient Name: Carlos Levine Birthdate: 08-24-1941 Sex: male Admission Date (Current Location): 10/22/2022  [facility] and [place] Number:  Engineering geologist and Address:         Provider Number: [id number]  Attending Physician Name and Address:  Gwynne Edinger, MD  Relative Name and Phone Number:       Current Level of Care: Hospital Recommended Level of Care: [place] Prior Approval Number:    Date Approved/Denied:   PASRR Number: [id number]  Discharge Plan: SNF    Current Diagnoses: Patient Active Problem List   Diagnosis Date Noted   Peripheral edema 10/24/2022   Cellulitis 10/23/2022   HFrEF (heart failure with reduced ejection fraction) ([place]) 10/23/2022   Normocytic anemia 10/22/2022   Hyponatremia 10/06/2022   Orthostatic hypotension 10/05/2022   Generalized weakness 10/05/2022   Malnutrition of moderate degree 10/03/2022   Constipation 10/03/2022   Cellulitis in diabetic foot ([place]) 10/02/2022   Acute on chronic HFrEF (heart failure with reduced ejection fraction) ([place]) 10/01/2022   Drug-induced gynecomastia 04/03/2022   PAF (paroxysmal atrial fibrillation) ([place]) 04/03/2022   Loss of weight 12/18/2021   History of prostate cancer 12/18/2021   S/P placement of leadless cardiac pacemaker 08/09/2020   Sick sinus syndrome ([place]) 07/25/2020   Coronary artery disease involving native coronary artery of native heart 09/23/2019   SOB (shortness of breath) 05/26/2019   SVT (supraventricular tachycardia) 05/26/2019   Squamous cell lung cancer, right ([place]) 04/20/2019   Localized enlarged lymph nodes 03/29/2019   Goals of care, counseling/discussion 03/29/2019   Prostate cancer ([place]) 03/08/2019   Chronic bilateral low back pain without sciatica 02/21/2018   Chronic constipation 11/19/2017   Elevated troponin 06/11/2017   Dizziness 06/24/2016   Lumbar stenosis with neurogenic  claudication 07/10/2015   Sore of lower lip 05/13/2015   Benign essential hypertension 02/02/2015   Type 2 diabetes mellitus with diabetic chronic kidney disease ([place]) 02/02/2015   Pure hypercholesterolemia 02/02/2015   Lumbar degenerative disc disease 02/02/2015   Stage 3b chronic kidney disease (CKD) ([place]) 05/12/2014   Malignant neoplasm of prostate ([place]) 07/14/2012   Hypertrophy of breast 07/14/2012   ED (erectile dysfunction) of organic origin 07/14/2012    Orientation RESPIRATION BLADDER Height & Weight     Self  Normal Incontinent, External catheter Weight: 93.6 kg Height:  5\' 8"  (172.7 cm)  BEHAVIORAL SYMPTOMS/MOOD NEUROLOGICAL BOWEL NUTRITION STATUS      Continent Diet (Carb modified diet)  AMBULATORY STATUS COMMUNICATION OF NEEDS Skin   Extensive Assist Verbally Other (Comment) (Bilateral cellulitis and blisters to lower extremities)                       Personal Care Assistance Level of Assistance              Functional Limitations Info             SPECIAL CARE FACTORS FREQUENCY  PT (By licensed PT), OT (By licensed OT)                    Contractures Contractures Info: Not present    Additional Factors Info  Code Status, Allergies Code Status Info: DNR Allergies Info: Contrast Media (Iodinated Contrast Media), Ace Inhibitors           Current Medications (10/27/2022):  This is the current hospital active medication list Current Facility-Administered Medications  Medication Dose  Route Frequency Provider Last Rate Last Admin   0.9 %  sodium chloride infusion  250 mL Intravenous PRN Schnier, Dolores Lory, MD       acetaminophen (TYLENOL) tablet 650 mg  650 mg Oral Q6H PRN Schnier, Dolores Lory, MD   650 mg at 10/26/22 2143   Or   acetaminophen (TYLENOL) suppository 650 mg  650 mg Rectal Q6H PRN Schnier, Dolores Lory, MD       ascorbic acid (VITAMIN C) tablet 500 mg  500 mg Oral BID Gwynne Edinger, MD   500 mg at 10/27/22 0917   ceFEPIme  (MAXIPIME) 2 g in sodium chloride 0.9 % 100 mL IVPB  2 g Intravenous Q12H Lorin Picket, RPH 200 mL/hr at 10/27/22 0438 2 g at 10/27/22 0438   feeding supplement (ENSURE ENLIVE / ENSURE PLUS) liquid 237 mL  237 mL Oral TID BM Gwynne Edinger, MD   237 mL at 10/27/22 0918   heparin ADULT infusion 100 units/mL (25000 units/256mL)  750 Units/hr Intravenous Continuous Gwynne Edinger, MD 7.5 mL/hr at 10/27/22 0608 750 Units/hr at 10/27/22 2952   insulin aspart (novoLOG) injection 0-15 Units  0-15 Units Subcutaneous TID WC Gwynne Edinger, MD   5 Units at 10/27/22 1205   insulin aspart (novoLOG) injection 0-5 Units  0-5 Units Subcutaneous QHS Schnier, Dolores Lory, MD   3 Units at 10/25/22 2156   insulin glargine-yfgn (SEMGLEE) injection 5 Units  5 Units Subcutaneous QHS Gwynne Edinger, MD   5 Units at 10/26/22 2142   ipratropium-albuterol (DUONEB) 0.5-2.5 (3) MG/3ML nebulizer solution 3 mL  3 mL Nebulization BID Gwynne Edinger, MD   3 mL at 10/27/22 0721   metroNIDAZOLE (FLAGYL) IVPB 500 mg  500 mg Intravenous Q12H Katha Cabal, MD 100 mL/hr at 10/27/22 1205 500 mg at 10/27/22 1205   multivitamin (PROSIGHT) tablet 1 tablet  1 tablet Oral Daily Gwynne Edinger, MD   1 tablet at 10/27/22 0918   ondansetron (ZOFRAN) injection 4 mg  4 mg Intravenous Q6H PRN Schnier, Dolores Lory, MD       oxyCODONE (Oxy IR/ROXICODONE) immediate release tablet 5-10 mg  5-10 mg Oral Q4H PRN Schnier, Dolores Lory, MD       polyethylene glycol (MIRALAX / GLYCOLAX) packet 17 g  17 g Oral Daily Gwynne Edinger, MD   17 g at 10/27/22 8413   pravastatin (PRAVACHOL) tablet 80 mg  80 mg Oral QHS Schnier, Dolores Lory, MD   80 mg at 10/26/22 2143   pregabalin (LYRICA) capsule 50 mg  50 mg Oral BID Katha Cabal, MD   50 mg at 10/27/22 2440   senna (SENOKOT) tablet 8.6 mg  1 tablet Oral Daily Gwynne Edinger, MD   8.6 mg at 10/27/22 0917   sodium chloride flush (NS) 0.9 % injection 3 mL  3 mL Intravenous  Q12H Schnier, Dolores Lory, MD   3 mL at 10/27/22 0919   sodium chloride flush (NS) 0.9 % injection 3 mL  3 mL Intravenous PRN Schnier, Dolores Lory, MD       traZODone (DESYREL) tablet 25 mg  25 mg Oral QHS PRN Schnier, Dolores Lory, MD   25 mg at 10/26/22 2143   vancomycin (VANCOREADY) IVPB 750 mg/150 mL  750 mg Intravenous Q24H Katha Cabal, MD 150 mL/hr at 10/27/22 0037 750 mg at 10/27/22 0037     Discharge Medications: Please see discharge summary for a list of discharge medications.  Relevant Imaging Results:  Relevant Lab Results:   Additional Information SS#: [id number]  Beverly Sessions, RN

## 2022-10-27 NOTE — Progress Notes (Signed)
[place] for heparin infusion initiation and monitoring Indication: atrial fibrillation  Allergies  Allergen Reactions   Contrast Media [Iodinated Contrast Media] Hives and Rash   Ace Inhibitors Other (See Comments)    Unknown reaction type   Latex Rash    Patient Measurements: Height: 5\' 8"  (172.7 cm) Weight: 93.6 kg (206 lb 5.6 oz) IBW/kg (Calculated) : 68.4 Heparin Dosing Weight: 87.3 kg  Vital Signs: Temp: 98.2 F (36.8 C) (01/08 0815) BP: 117/59 (01/08 0815) Pulse Rate: 91 (01/08 0815)  Labs: Recent Labs    10/25/22 0435 10/26/22 0428 10/26/22 0913 10/26/22 0913 10/26/22 1813 10/27/22 0355 10/27/22 1436  HGB 12.1*  --   --   --   --   --   --   HCT 39.9  --   --   --   --   --   --   PLT 171  --   --   --   --   --   --   APTT  --   --  41*   < > 175* 145* 81*  LABPROT  --   --  19.3*  --   --   --   --   INR  --   --  1.6*  --   --   --   --   HEPARINUNFRC  --   --   --   --   --  >1.10*  --   CREATININE 1.69* 1.68*  --   --   --  1.60*  --    < > = values in this interval not displayed.     Estimated Creatinine Clearance: 38.8 mL/min (A) (by C-G formula based on SCr of 1.6 mg/dL (H)).   Medical History: Past Medical History:  Diagnosis Date   Arrhythmia    PAF   Arthritis    Cancer ([place])    prostate   CHF (congestive heart failure) (HCC)    Chronic kidney disease    COPD (chronic obstructive pulmonary disease) (H[facility])    Coronary artery disease    Diabetes mellitus without complication (HCC)    Dyspnea    with minimal exertion   Dysrhythmia    history of SVT   History of kidney stones    History of radiation therapy    lung   Hyperkalemia 11/19/2017   Hyperlipidemia    Hypertension    Peripheral vascular disease (H[facility])    Prostate cancer (HCC)    Squamous cell lung cancer ([place]) 04/2020   also has history of skin cancer    Medications:  Scheduled:   vitamin C  500 mg Oral BID   feeding  supplement  237 mL Oral TID BM   insulin aspart  0-15 Units Subcutaneous TID WC   insulin aspart  0-5 Units Subcutaneous QHS   insulin glargine-yfgn  5 Units Subcutaneous QHS   ipratropium-albuterol  3 mL Nebulization BID   multivitamin  1 tablet Oral Daily   polyethylene glycol  17 g Oral Daily   pravastatin  80 mg Oral QHS   pregabalin  50 mg Oral BID   senna  1 tablet Oral Daily   sodium chloride flush  3 mL Intravenous Q12H    Assessment: 82 y/o male with h/o CHF, COPD, DM, HLD, HTN, CAD, pacemaker, prostate cancer, SCLC s/p XRT, CKD III and blisters on bilateral feet who is admitted with foot infection and cellulitis. Patient is being transitioned to heparin  from Eliquis after speaking with primary service to facilitate option of surgery   apixaban last dose 10/25/22 2157  Goal of Therapy:  aPTT 66 - 102  seconds Monitor platelets by anticoagulation protocol: Yes   1/7 @ 1813: aPTT >130 sec, supratherapeutic @ 1300 u/hr 1/8 @ 0355: aPTT= 145 sec, HL = > 1.10 1/8 @ 1436: aPTT= 81 sec  Plan:  Therapeutic x 1 - Will continue current heparin infusion at 750 units/hr.  - HL is elevated from Eliquis PTA so will continue to use aPTT to guide dosing - Check confirmatory aPTT 8 hours (check anti-Xa level once daily for correlation) - Continue to monitor H&H and platelets  Pearla Dubonnet, PharmD Clinical Pharmacist 10/27/2022 3:44 PM

## 2022-10-27 NOTE — Progress Notes (Signed)
DISH NOTE       Patient ID: Carlos VIRGIL MRN: [id number] DOB/AGE: 01/10/1941 82 y.o.  Admit date: 10/22/2022 Referring Physician Dr. Laurey Arrow Primary Physician Dr. Caryl Comes Primary Cardiologist Dr. Nehemiah Massed Reason for Consultation AoCHF  HPI: Carlos Locket. Dowty is an 82yoM with a PMH of HFrEF (84%, global hypo), ?low flow AS (peak grad 24.32mmHg, 2.43m/s peak vel), paroxysmal AF on dose reduced eliquis per pt preference, SSS s/p Medtronic Micra leadless PPM, CKD 3, DM2, PAD s/p L common and ext iliac stents, hx prostate cancer, hx lung cancer s/p XRT who presented to [facility] ED 10/22/22 with worsening left foot swelling and redness at the request of the heart failure clinic.  He was recently admitted from 12/13 - 12/22 for decompensated HFrEF c/b orthostasis and cellulitis of bilateral LE. Cardiology was consulted for assistance with his heart failure. He was diuresed with IV lasix and clinically improved. Echo this admission showed improved EF 35-40%, global hypo, severe 4 chamber dilation (valve eval limited). He underwent LE angiography on 1/5 which revealed extensive calcific plaques of bilateral common femoral arteries. Vascular is recommending open endarterectomy + stenting.   Interval History:  - sitting up in bed, breakfast tray empty - he says his biggest issue is his breathing, feeling short of breath with movement. Says his LE swelling is better. No chest pain or heart racing.   - acknowledges intermittent confusion - sometimes sees his mom and other family members in the room and speaks to them although they are not physically present - IV lasix held d/t up trending Cr. I/O net even   Review of systems complete and found to be negative unless listed above     Past Medical History:  Diagnosis Date   Arrhythmia    PAF   Arthritis    Cancer ([place])    prostate   CHF (congestive heart failure) (HCC)    Chronic kidney disease    COPD (chronic obstructive  pulmonary disease) (HCC)    Coronary artery disease    Diabetes mellitus without complication ([place])    Dyspnea    with minimal exertion   Dysrhythmia    history of SVT   History of kidney stones    History of radiation therapy    lung   Hyperkalemia 11/19/2017   Hyperlipidemia    Hypertension    Peripheral vascular disease (HCC)    Prostate cancer ([place])    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   also has history of skin cancer    Past Surgical History:  Procedure Laterality Date   AMPUTATION FINGER / THUMB Right    BACK SURGERY  1975   removed 2 discs and fused others   LOWER EXTREMITY ANGIOGRAPHY Left 10/24/2022   Procedure: Lower Extremity Angiography;  Surgeon: Katha Cabal, MD;  Location: [place] [facility];  Service: Cardiovascular;  Laterality: Left;   PACEMAKER LEADLESS INSERTION N/A 07/25/2020   Procedure: PACEMAKER LEADLESS INSERTION;  Surgeon: Isaias Cowman, MD;  Location: [place] [facility];  Service: Cardiovascular;  Laterality: N/A;   PROSTATE CRYOABLATION     SPINAL FUSION     STENT PLACEMENT ILIAC ([place] [facility]X) Left 2014   Left common iliac and left external iliac by Dr. Delana Meyer   VIDEO BRONCHOSCOPY WITH ENDOBRONCHIAL NAVIGATION N/A 05/30/2019   Procedure: [place], DIABETIC;  Surgeon: Ottie Glazier, MD;  Location: [facility];  Service: Thoracic;  Laterality: N/A;   VIDEO BRONCHOSCOPY WITH ENDOBRONCHIAL  NAVIGATION N/A 05/28/2020   Procedure: VIDEO BRONCHOSCOPY WITH ENDOBRONCHIAL NAVIGATION;  Surgeon: Ottie Glazier, MD;  Location: [facility];  Service: Thoracic;  Laterality: N/A;   VIDEO BRONCHOSCOPY WITH ENDOBRONCHIAL ULTRASOUND N/A 05/30/2019   Procedure: VIDEO BRONCHOSCOPY WITH ENDOBRONCHIAL ULTRASOUND, DIABETIC;  Surgeon: Ottie Glazier, MD;  Location: [facility];  Service: Thoracic;  Laterality: N/A;   VIDEO BRONCHOSCOPY WITH ENDOBRONCHIAL ULTRASOUND N/A 05/28/2020   Procedure: VIDEO BRONCHOSCOPY WITH ENDOBRONCHIAL  ULTRASOUND;  Surgeon: Ottie Glazier, MD;  Location: [facility];  Service: Thoracic;  Laterality: N/A;    Medications Prior to Admission  Medication Sig Dispense Refill Last Dose   apixaban (ELIQUIS) 2.5 MG TABS tablet Take 1 tablet (2.5 mg total) by mouth every 12 (twelve) hours. 60 tablet 3 10/22/2022   ascorbic acid (VITAMIN C) 500 MG tablet Take 1 tablet (500 mg total) by mouth 2 (two) times daily. 60 tablet 1 10/22/2022   bacitracin ointment Apply topically 2 (two) times daily. Apply to blisters on legs 120 g 0 10/22/2022   bicalutamide (CASODEX) 50 MG tablet Take 1 tablet (50 mg total) by mouth daily at 12 noon. 90 tablet 3 10/22/2022   dapagliflozin propanediol (FARXIGA) 10 MG TABS tablet Take 1 tablet (10 mg total) by mouth daily. 30 tablet 1 10/22/2022   feeding supplement (ENSURE ENLIVE / ENSURE PLUS) LIQD Take 237 mLs by mouth 3 (three) times daily between meals. 237 mL 12 10/22/2022   ipratropium-albuterol (DUONEB) 0.5-2.5 (3) MG/3ML SOLN Take 3 mLs by nebulization 2 (two) times daily.   10/22/2022   Multiple Vitamin (MULTIVITAMIN WITH MINERALS) TABS tablet Take 1 tablet by mouth daily.   10/22/2022   pravastatin (PRAVACHOL) 80 MG tablet Take 80 mg by mouth at bedtime.    10/21/2022   pregabalin (LYRICA) 50 MG capsule Take 1 capsule (50 mg total) by mouth 2 (two) times daily. 10 capsule 0 10/22/2022   traZODone (DESYREL) 50 MG tablet Take 0.5 tablets (25 mg total) by mouth at bedtime as needed for sleep. 10 tablet 0 10/21/2022   zinc sulfate 220 (50 Zn) MG capsule Take 1 capsule (220 mg total) by mouth daily. 30 capsule 1 10/22/2022   bisacodyl (DULCOLAX) 5 MG EC tablet Take 1 tablet (5 mg total) by mouth daily as needed for moderate constipation. 30 tablet 0 prn at prn   furosemide (LASIX) 20 MG tablet Take 1 tablet (20 mg total) by mouth 2 (two) times daily. 60 tablet 1    midodrine (PROAMATINE) 10 MG tablet Take 1 tablet (10 mg total) by mouth 3 (three) times daily with meals. (Patient not taking: Reported  on 10/22/2022) 90 tablet 1 Not Taking   polyethylene glycol (MIRALAX / GLYCOLAX) 17 g packet Take 17 g by mouth daily. (Patient not taking: Reported on 10/22/2022) 14 each 0 Not Taking   valACYclovir (VALTREX) 500 MG tablet Take 500 mg by mouth 3 (three) times daily as needed. (Patient not taking: Reported on 10/22/2022)   Not Taking    Social History   Socioeconomic History   Marital status: Married    Spouse name: [name]   Number of children: 4   Years of education: Not on file   Highest education level: Not on file  Occupational History   Occupation: Games developer    Comment: retired  Tobacco Use   Smoking status: Former    Years: 30.00    Types: Cigarettes    Quit date: 10/20/2009    Years since quitting: 13.0   Smokeless tobacco: Never  Vaping Use   Vaping Use: Never used  Substance and Sexual Activity   Alcohol use: No   Drug use: No   Sexual activity: Yes    Birth control/protection: None  Other Topics Concern   Not on file  Social History Narrative   Patient lives with wife and occasionally daughter and granddaughter.   Social Determinants of Health   Financial Resource Strain: Low Risk  (04/13/2019)   Overall Financial Resource Strain (CARDIA)    Difficulty of Paying Living Expenses: Not hard at all  Food Insecurity: No Food Insecurity (10/23/2022)   Hunger Vital Sign    Worried About Running Out of Food in the Last Year: Never true    Ran Out of Food in the Last Year: Never true  Transportation Needs: No Transportation Needs (10/23/2022)   PRAPARE - Hydrologist (Medical): No    Lack of Transportation (Non-Medical): No  Physical Activity: Unknown (04/13/2019)   Exercise Vital Sign    Days of Exercise per Week: 0 days    Minutes of Exercise per Session: Not on file  Stress: Stress Concern Present (04/13/2019)   [place]    Feeling of Stress : To some extent  Social Connections:  Moderately Isolated (04/13/2019)   Social Connection and Isolation Panel [NHANES]    Frequency of Communication with Friends and Family: More than three times a week    Frequency of Social Gatherings with Friends and Family: Not on file    Attends Religious Services: Never    Active Member of Clubs or Organizations: No    Attends Archivist Meetings: Never    Marital Status: Married  Human resources officer Violence: Not At Risk (10/23/2022)   Humiliation, Afraid, Rape, and Kick questionnaire    Fear of Current or Ex-Partner: No    Emotionally Abused: No    Physically Abused: No    Sexually Abused: No    Family History  Problem Relation Age of Onset   Lung cancer Father    Pancreatic cancer Father    Brain cancer Mother    Stomach cancer Paternal Aunt    Stomach cancer Paternal Uncle    Cancer Maternal Grandmother    Kidney cancer Maternal Grandfather       Intake/Output Summary (Last 24 hours) at 10/27/2022 0913 Last data filed at 10/27/2022 6160 Gross per 24 hour  Intake 1144.56 ml  Output 1600 ml  Net -455.44 ml     Vitals:   10/27/22 0349 10/27/22 0500 10/27/22 0723 10/27/22 0815  BP: 112/68   (!) 117/59  Pulse: 66   91  Resp: 16   17  Temp: 98.6 F (37 C)   98.2 F (36.8 C)  TempSrc:      SpO2: 98%  96% 94%  Weight:  93.6 kg    Height:        PHYSICAL EXAM General: Elderly and ill-appearing Caucasian male, well nourished, in no acute distress.  Sitting upright in hospital bed. HEENT:  Normocephalic and atraumatic. Neck:  + JVD.  Lungs: Slight conversational dyspnea on room air.  Decreased bibasilar breath sounds, trace crackles, no wheezes  Heart: HRRR .  Distant heart sounds. normal S1 and S2 with 2/6 systolic murmur best heard at RUSB Abdomen: Mildly distended appearing.  Msk: Normal strength and tone for age. Extremities:  trace bilateral LE edema, kerlix dressing on both ankles. Edema and erythema much improved  Neuro: Alert  and oriented x 3  Psych:  Irritable mood. Answers questions appropriately.   Labs: Basic Metabolic Panel: Recent Labs    10/25/22 0435 10/26/22 0428 10/27/22 0355  NA 138 139 139  K 4.7 4.8 4.7  CL 98 99 102  CO2 26 27 26   GLUCOSE 236* 106* 162*  BUN 79* 88* 88*  CREATININE 1.69* 1.68* 1.60*  CALCIUM 8.5* 8.3* 8.2*  MG 2.6*  --   --   PHOS 6.4*  --   --     Liver Function Tests: No results for input(s): "AST", "ALT", "ALKPHOS", "BILITOT", "PROT", "ALBUMIN" in the last 72 hours.  No results for input(s): "LIPASE", "AMYLASE" in the last 72 hours. CBC: Recent Labs    10/25/22 0435  WBC 4.3  HGB 12.1*  HCT 39.9  MCV 99.0  PLT 171    Cardiac Enzymes: No results for input(s): "CKTOTAL", "CKMB", "CKMBINDEX", "TROPONINIHS" in the last 72 hours. BNP: No results for input(s): "BNP" in the last 72 hours.  D-Dimer: No results for input(s): "DDIMER" in the last 72 hours. Hemoglobin A1C: No results for input(s): "HGBA1C" in the last 72 hours. Fasting Lipid Panel: No results for input(s): "CHOL", "HDL", "LDLCALC", "TRIG", "CHOLHDL", "LDLDIRECT" in the last 72 hours. Thyroid Function Tests: No results for input(s): "TSH", "T4TOTAL", "T3FREE", "THYROIDAB" in the last 72 hours.  Invalid input(s): "FREET3" Anemia Panel: No results for input(s): "VITAMINB12", "FOLATE", "FERRITIN", "TIBC", "IRON", "RETICCTPCT" in the last 72 hours.   Radiology: ECHOCARDIOGRAM COMPLETE  Result Date: 10/25/2022    ECHOCARDIOGRAM REPORT   Patient Name:   JAVARES KAUFHOLD Date of Exam: 10/24/2022 Medical Rec #:  [id number]      Height:       68.0 in Accession #:    [id number]     Weight:       201.5 lb Date of Birth:  05-17-1941      BSA:          2.050 m Patient Age:    47 years       BP:           92/72 mmHg Patient Gender: M              HR:           94 bpm. Exam Location:  [facility] Procedure: 2D Echo, Cardiac Doppler and Color Doppler Indications:     CHF--acute systolic I09.73  History:         Patient has prior history of  Echocardiogram examinations, most                  recent 05/29/2019. CHF, COPD; Risk Factors:Hypertension.  Sonographer:     Sherrie Sport Referring Phys:  [id number] Jarrell Jerryl Holzhauer Diagnosing Phys: Neoma Laming  Sonographer Comments: Technically difficult study due to poor echo windows, no apical window and no parasternal window. Image acquisition challenging due to uncooperative patient. IMPRESSIONS  1. Left ventricular ejection fraction, by estimation, is 35 to 40%. The left ventricle has moderately decreased function. The left ventricle demonstrates global hypokinesis. The left ventricular internal cavity size was severely dilated. There is mild left ventricular hypertrophy. Left ventricular diastolic function could not be evaluated.  2. Right ventricular systolic function is moderately reduced. The right ventricular size is severely enlarged. Mildly increased right ventricular wall thickness.  3. Left atrial size was severely dilated.  4. Right atrial size was severely dilated.  5. The mitral valve was not well visualized. No evidence of mitral valve  regurgitation.  6. The aortic valve was not well visualized. Aortic valve regurgitation is not visualized. Aortic valve sclerosis/calcification is present, without any evidence of aortic stenosis.  7. Aortic unable to see. FINDINGS  Left Ventricle: Left ventricular ejection fraction, by estimation, is 35 to 40%. The left ventricle has moderately decreased function. The left ventricle demonstrates global hypokinesis. The left ventricular internal cavity size was severely dilated. There is mild left ventricular hypertrophy. Left ventricular diastolic function could not be evaluated. Right Ventricle: The right ventricular size is severely enlarged. Mildly increased right ventricular wall thickness. Right ventricular systolic function is moderately reduced. Left Atrium: Left atrial size was severely dilated. Right Atrium: Right atrial size was severely dilated.  Pericardium: The pericardium was not assessed. Mitral Valve: The mitral valve was not well visualized. No evidence of mitral valve regurgitation. Tricuspid Valve: The tricuspid valve is not assessed. Tricuspid valve regurgitation is not demonstrated. Aortic Valve: The aortic valve was not well visualized. Aortic valve regurgitation is not visualized. Aortic valve sclerosis/calcification is present, without any evidence of aortic stenosis. Pulmonic Valve: The pulmonic valve was not assessed. Pulmonic valve regurgitation is not visualized. Aorta: Unable to see. IAS/Shunts: The interatrial septum was not assessed.  LEFT VENTRICLE PLAX 2D LVIDd:         4.50 cm LVIDs:         3.70 cm LV PW:         1.20 cm LV IVS:        1.10 cm  LEFT ATRIUM         Index LA diam:    5.10 cm 2.49 cm/m Neoma Laming Electronically signed by Neoma Laming Signature Date/Time: 10/25/2022/9:08:14 AM    Final    PERIPHERAL VASCULAR CATHETERIZATION  Result Date: 10/24/2022 See surgical note for result.  US ARTERIAL LOWER EXTREMITY DUPLEX BILATERAL  Result Date: 10/23/2022 CLINICAL DATA:  82 year old male with a infection EXAM: NONINVASIVE PHYSIOLOGIC VASCULAR STUDY OF BILATERAL LOWER EXTREMITIES TECHNIQUE: Evaluation of both lower extremities was performed at rest, including calculation of ankle-brachial indices, directed duplex COMPARISON:  None Available. FINDINGS: Right ABI:  Not acquired Left ABI:  Not acquired Right Lower Extremity: Directed duplex of the right lower extremity demonstrates triphasic waveform of the common femoral artery, profunda femoris and proximal SFA. Distal SFA and popliteal artery demonstrate monophasic waveform. Posterior tibial artery and anterior tibial artery monophasic. No elevated velocity identified. Left Lower Extremity: Directed duplex of the left lower extremity demonstrates monophasic postobstructive waveform of the common femoral artery, profunda femoris, SFA, popliteal artery. Monophasic posterior  tibial artery and anterior tibial artery. The imaged portions of the SFA are patent. No elevated velocities. IMPRESSION: Right: Directed duplex of the right lower extremity demonstrates evidence of multifocal SFA disease, contributing to monophasic popliteal and tibial artery waveforms. Left: Directed duplex of the left lower extremity demonstrates occlusive disease of the iliac system, with patency of the left femoropopliteal system, monophasic waveforms throughout. Signed, Dulcy Fanny. Nadene Rubins, RPVI [facility] Electronically Signed   By: Corrie Mckusick D.O.   On: 10/23/2022 14:12   DG Foot 2 Views Left  Result Date: 10/23/2022 CLINICAL DATA:  Left foot blisters and skin breakdown. EXAM: LEFT FOOT - 2 VIEW COMPARISON:  None Available. FINDINGS: No bony destruction or periosteal reaction. No acute fracture or dislocation. Joint spaces are preserved. Bone mineralization is normal. Dorsal foot soft tissue swelling. IMPRESSION: 1. Dorsal foot soft tissue swelling. No acute osseous abnormality. Electronically Signed  By: Titus Dubin M.D.   On: 10/23/2022 11:30   CT CHEST WO CONTRAST  Result Date: 10/23/2022 CLINICAL DATA:  Right-sided pleural effusion EXAM: CT CHEST WITHOUT CONTRAST TECHNIQUE: Multidetector CT imaging of the chest was performed following the standard protocol without IV contrast. RADIATION DOSE REDUCTION: This exam was performed according to the departmental dose-optimization program which includes automated exposure control, adjustment of the mA and/or kV according to patient size and/or use of iterative reconstruction technique. COMPARISON:  CT 04/01/2022, chest x-ray from the previous day. FINDINGS: Cardiovascular: Limited due to lack of IV contrast. Atherosclerotic calcifications are noted. Heavy coronary calcifications are seen. Heart is not significantly enlarged in size. Lead less pacemaker is noted within the distal  aspect of the right ventricle. Mediastinum/Nodes: Thoracic inlet is within normal limits. No hilar or mediastinal adenopathy is noted. The esophagus as visualized is within normal limits. Lungs/Pleura: Mild emphysematous changes are noted in the left lung. No focal infiltrate or sizable effusion is seen. No sizable parenchymal nodules are noted. On the right there is a large pleural effusion which has increased in the interval from the prior exam. Lower lobe consolidation is noted as well as some upper lobe consolidation likely related to prior treatment. Fullness in the right hilum is noted stable from previous exams. Upper Abdomen: Visualized upper abdomen is within normal limits. Musculoskeletal: No rib abnormality is noted. Degenerative changes of thoracic spine are seen. IMPRESSION: Increasing consolidation in the right lower and right upper lobes when compared with the prior exam from June of 2023. Increasing right-sided pleural effusion is noted as well. Persistent volume loss in fullness in the right hilum is noted related to prior therapy. No other focal abnormality is noted. Aortic Atherosclerosis (ICD10-I70.0) and Emphysema (ICD10-J43.9). Electronically Signed   By: [name] M.D.   On: 10/23/2022 01:47   DG Chest Portable 1 View  Result Date: 10/22/2022 CLINICAL DATA:  Nonhealing left foot wound and shortness of breath. EXAM: PORTABLE CHEST 1 VIEW COMPARISON:  October 01, 2022 FINDINGS: The cardiac silhouette is mildly enlarged and unchanged in size. There is marked severity calcification of the thoracic aorta. There is stable opacification of the mid right lung and right lung base with subsequent right-sided volume loss. Mild, stable right upper lobe scarring and/or airspace disease is also seen. Chronic appearing increased lung markings are seen throughout the left lung. A stable moderate sized right-sided pleural effusion is noted. No pneumothorax is identified. Multilevel degenerative changes  are seen throughout the thoracic spine. IMPRESSION: 1. Stable post treatment changes throughout the right lung with subsequent right-sided volume loss. Electronically Signed   By: Virgina Norfolk M.D.   On: 10/22/2022 21:08   US Venous Img Lower Bilateral (DVT)  Result Date: 10/01/2022 CLINICAL DATA:  Leg pain and swelling. EXAM: BILATERAL LOWER EXTREMITY VENOUS DOPPLER ULTRASOUND TECHNIQUE: Gray-scale sonography with compression, as well as color and duplex ultrasound, were performed to evaluate the deep venous system(s) from the level of the common femoral vein through the popliteal and proximal calf veins. COMPARISON:  None Available. FINDINGS: VENOUS Normal compressibility of the common femoral, superficial femoral, and popliteal veins, as well as the visualized calf veins. Visualized portions of profunda femoral vein and great saphenous vein unremarkable. No filling defects to suggest DVT on grayscale or color Doppler imaging. Doppler waveforms show normal direction of venous flow, normal respiratory plasticity and response to augmentation. OTHER Subcutaneous soft tissue edema bilateral calf. Limitations: none IMPRESSION: Negative. Electronically Signed   By: Wyatt Mage  Ahmed D.O.   On: 10/01/2022 22:48   DG Chest 2 View  Result Date: 10/01/2022 CLINICAL DATA:  Leg swelling. EXAM: CHEST - 2 VIEW COMPARISON:  06/29/2022 FINDINGS: Stable significant post treatment changes involving the right hemithorax with loss of volume and changes of radiation fibrosis. Associated right pleural effusion. The left lung remains relatively clear. No pulmonary lesions, overt pulmonary edema or left pleural effusion. IMPRESSION: 1. Stable significant post treatment changes involving the right hemithorax. 2. The left lung remains relatively clear. No left-sided pleural effusion. Electronically Signed   By: Marijo Sanes M.D.   On: 10/01/2022 11:58    ECHO 09/08/2022 ECHOCARDIOGRAPHIC MEASUREMENTS  2D DIMENSIONS  AORTA                   Values   Normal Range   MAIN PA         Values    Normal Range                Annulus: nm*          [2.3-2.9]         PA Main: nm*       [1.5-2.1]              Aorta Sin: nm*          [3.1-3.7]    RIGHT VENTRICLE            ST Junction: nm*          [2.6-3.2]         RV Base: nm*       [<4.2]             Asc.Aorta: nm*          [2.6-3.4]          RV Mid: nm*       [<3.5]  LEFT VENTRICLE                                      RV Length: nm*       [<8.6]                 LVIDd: 5.0 cm       [4.2-5.9]    INFERIOR VENA CAVA                  LVIDs: 4.7 cm                        Max. IVC: nm*       [<=2.1]                    FS: 5.4 %        [>25]            Min. IVC: nm*                    SWT: 0.83 cm      [0.6-1.0]    ------------------                    PWT: 0.83 cm      [0.6-1.0]    nm* - not measured  LEFT ATRIUM                LA Diam: 4.6 cm       [3.0-4.0]  LA A4C Area: nm*          [<20]             LA Volume: nm*          [18-58]  _________________________________________________________________________________________  ECHOCARDIOGRAPHIC DESCRIPTIONS  AORTIC ROOT                   Size: Normal             Dissection: INDETERM FOR DISSECTION  AORTIC VALVE               Leaflets: Tricuspid                   Morphology: SEVERELY THICKENED               Mobility: PARTIALLY MOBILE  LEFT VENTRICLE                   Size: Normal                        Anterior: HYPOCONTRACTILE            Contraction: Normal                         Lateral: HYPOCONTRACTILE             Closest EF: 20% (Estimated)                 Septal: HYPOCONTRACTILE              LV Masses: No Masses                       Apical: HYPOCONTRACTILE                    LVH: None                          Inferior: HYPOCONTRACTILE                                                      Posterior: HYPOCONTRACTILE           Dias.FxClass: N/A  MITRAL VALVE               Leaflets: Normal                         Mobility: Fully mobile             Morphology: THICKENED LEAFLET(S)  LEFT ATRIUM                   Size: MILDLY ENLARGED              LA Masses: No masses              IA Septum: Normal IAS  MAIN PA                   Size: Normal  PULMONIC VALVE             Morphology: Normal                        Mobility:  Fully mobile  RIGHT VENTRICLE              RV Masses: No Masses                         Size: Normal              Free Wall: Normal                     Contraction: Normal  TRICUSPID VALVE               Leaflets: Normal                        Mobility: Fully mobile             Morphology: Normal  RIGHT ATRIUM                   Size: Normal                        RA Other: None                RA Mass: No masses  PERICARDIUM                 Fluid: No effusion  INFERIOR VENACAVA                   Size: Normal Normal respiratory collapse  _________________________________________________________________________________________   DOPPLER ECHO and OTHER SPECIAL PROCEDURES                 Aortic: MILD AR                    SEVERE AS                         249.0 cm/sec peak vel      24.8 mmHg peak grad                         14.0 mmHg mean grad        0.80 cm^2 by DOPPLER                 Mitral: MILD MR                    No MS                         MV Inflow E Vel = nm*      MV Annulus E'Vel = nm*                         E/E'Ratio = nm*              Tricuspid: MILD TR                    No TS                         313.0 cm/sec peak TR vel   54.2 mmHg peak RV pressure              Pulmonary: MILD PR  No PS  _________________________________________________________________________________________  INTERPRETATION  SEVERE LV SYSTOLIC DYSFUNCTION (See above)  NORMAL RIGHT VENTRICULAR SYSTOLIC FUNCTION  MILD VALVULAR REGURGITATION (See above)  SEVERE VALVULAR STENOSIS (See above)  Poor sound wave transmission significantly limited this study - pt with  significant AS  and MS  _________________________________________________________________________________________  Electronically signed by      Rusty Aus, MD on 09/09/2022 08: 35 PM           Performed By: Scherrie November, RCS     Ordering Physician: Ramonita Lab   TELEMETRY reviewed by me (LT) 10/27/2022 : None available for review  EKG reviewed by me: Ectopic atrial rhythm with rate 98 bpm  Data reviewed by me (LT) 10/27/2022: heart failure clinic note, last outpatient cardiology note, CBC BMP BNP vitals EKGs, ED note admission H&P  Principal Problem:   Cellulitis in diabetic foot ([place]) Active Problems:   Benign essential hypertension   Stage 3b chronic kidney disease (CKD) ([place])   Type 2 diabetes mellitus with diabetic chronic kidney disease ([place])   Pure hypercholesterolemia   Squamous cell lung cancer, right ([place])   Sick sinus syndrome ([place])   PAF (paroxysmal atrial fibrillation) ([place])   Normocytic anemia   Cellulitis   HFrEF (heart failure with reduced ejection fraction) ([place])   Peripheral edema    ASSESSMENT AND PLAN:  Carlos Levine is an 15yoM with a PMH of HFrEF (84%, global hypo), ?low flow AS (peak grad 24.44mmHg, 2.58m/s peak vel), paroxysmal AF on dose reduced eliquis per pt preference, SSS s/p [employer] Micra leadless PPM, CKD 3, DM2, PAD s/p L common and ext iliac stents, hx prostate cancer, hx lung cancer s/p XRT who presented to [facility] ED 10/22/22 with worsening left foot swelling and redness at the request of the heart failure clinic.  He was recently admitted from 12/13 - 12/22 for decompensated HFrEF c/b orthostasis and cellulitis of bilateral LE. Cardiology was consulted for assistance with his heart failure. He was diuresed with IV lasix and clinically improved. Echo this admission showed improved EF 35-40%, global hypo, severe 4 chamber dilation (valve eval limited). He underwent LE angiography on 1/5 which revealed extensive calcific plaques of bilateral common  femoral arteries. Vascular is recommending open endarterectomy + stenting.   # acute on chronic HFrEF (35-40%, prev<20%, global hypo) # dilated CM  # ?low flow AS Presents for his second admission in 1 month with worsening peripheral edema c/b LE cellulitis and nonhealing ulcers. Echo 08/2022 at his PCP's office showed an EF <20%, with mild-mod AS, possibly low flow/gradient with poor LV output. Repeat this admission showed higher EF, global hypo, and assessment of valves technically limited.  BNP 1400. Admits to dietary indiscretions (country ham). Possibly AS contributing. Diuresed well and appears more compensated today - s/p IV lasix 40mg  x 1, 20mg  Iv x 3 with clinical improvement. Further dosing held d/t increasing Cr.  - restart PO lasix tomorrow, pending BMP  - GDMT initiation/titration limited by hypotension. Was given a dose of metoprolol XL 12.5mg  once daily on 1/3, but BP too low for more - salt and fluid restriction, strict I/O, daily weights   # LE cellulitis # severe PAD s/p left common and ext iliac stents - on abx per primary team -vascular planning for open endarterectomy + stenting. He is better compensated after diuresis from a HF standpoint without need for further cardiac diagnostics prior to proceeding with vascular procedure.   # paroxysmal AF RVR # SSS s/p Micra  PPM  In NSR with short runs of AF on tele, rate controlled in the 80s-90s.  -unable to tolerate metop -eliquis switched to heparin on 1/7 in anticipation of vascular procedure. On 2.5mg  BID at home, per the patient's preference although he does not meet criteria for this dosing.   # AKI on CKD 3  Baseline from discharge on 12/22 44/1.16, GFR >60. On admission Cr. 1.44 and GFR 48. With Cr trent 1.69 --1.68 -- 1.60, current GFR 42  This patient's plan of care was discussed and created with Dr. Clayborn Bigness and he is in agreement.  Signed: Tristan Schroeder , PA-C 10/27/2022, 9:13 AM [facility]  Cardiology

## 2022-10-27 NOTE — TOC Progression Note (Signed)
Transition of Care [facility]) - Progression Note    Patient Details  Name: Carlos Levine MRN: [id number] Date of Birth: [birth year]  Transition of Care [facility]) CM/SW Contact  Beverly Sessions, RN Phone Number: 10/27/2022, 12:53 PM  Clinical Narrative:     Cardiac clearance received today for potential b/l femoral endarterectomies  Patient A&O x1 Spoke with wife via phone  Therapy recommending SNF.  If at all possible she wants patient to return home at discharge.  She states it all depends on how he is doing after his procedure.  She is in agreement to a bed search as a back up plan if patient is not able to return home  Discussed option for home health Rn and therapy at home.  She is in agreement.  She states she does not have a preference of home health agency.  Referral made to [facility] with [facility].  Awaiting determination if he can accept    Patient may need WC at discharge.  Wife wants to wait to see how he does after procedure   Fl2 sent for signature.  Bed search initiated         Expected Discharge Plan and Services                                               Social Determinants of Health (SDOH) Interventions SDOH Screenings   Food Insecurity: No Food Insecurity (10/23/2022)  Housing: Low Risk  (10/23/2022)  Transportation Needs: No Transportation Needs (10/23/2022)  Utilities: Not At Risk (10/23/2022)  Financial Resource Strain: Low Risk  (04/13/2019)  Physical Activity: Unknown (04/13/2019)  Social Connections: Moderately Isolated (04/13/2019)  Stress: Stress Concern Present (04/13/2019)  Tobacco Use: Medium Risk (10/24/2022)    Readmission Risk Interventions    10/24/2022    3:03 PM  Readmission Risk Prevention Plan  Transportation Screening Complete  Medication Review (RN Care Manager) Complete  SW Recovery Care/Counseling Consult Complete  Palliative Care Screening Complete

## 2022-10-27 NOTE — Care Management Important Message (Signed)
Important Message  Patient Details  Name: Carlos Levine MRN: [id number] Date of Birth: January 09, 1941   Medicare Important Message Given:  Yes     [name] 10/27/2022, 10:42 AM

## 2022-10-27 NOTE — Progress Notes (Incomplete)
[place] for heparin infusion initiation and monitoring Indication: atrial fibrillation  Allergies  Allergen Reactions   Contrast Media [Iodinated Contrast Media] Hives and Rash   Ace Inhibitors Other (See Comments)    Unknown reaction type   Latex Rash    Patient Measurements: Height: 5\' 8"  (172.7 cm) Weight: 93.6 kg (206 lb 5.6 oz) IBW/kg (Calculated) : 68.4 Heparin Dosing Weight: 87.3 kg  Vital Signs: Temp: 98.6 F (37 C) (01/08 0349) Temp Source: Axillary (01/07 1919) BP: 112/68 (01/08 0349) Pulse Rate: 66 (01/08 0349)  Labs: Recent Labs    10/25/22 0435 10/26/22 0428 10/26/22 0913 10/26/22 1813 10/27/22 0355  HGB 12.1*  --   --   --   --   HCT 39.9  --   --   --   --   PLT 171  --   --   --   --   APTT  --   --  41* 175* 145*  LABPROT  --   --  19.3*  --   --   INR  --   --  1.6*  --   --   HEPARINUNFRC  --   --   --   --  >1.10*  CREATININE 1.69* 1.68*  --   --  1.60*     Estimated Creatinine Clearance: 38.8 mL/min (A) (by C-G formula based on SCr of 1.6 mg/dL (H)).   Medical History: Past Medical History:  Diagnosis Date   Arrhythmia    PAF   Arthritis    Cancer ([place])    prostate   CHF (congestive heart failure) (HCC)    Chronic kidney disease    COPD (chronic obstructive pulmonary disease) (HCC)    Coronary artery disease    Diabetes mellitus without complication (HCC)    Dyspnea    with minimal exertion   Dysrhythmia    history of SVT   History of kidney stones    History of radiation therapy    lung   Hyperkalemia 11/19/2017   Hyperlipidemia    Hypertension    Peripheral vascular disease (HCC)    Prostate cancer (HCC)    Squamous cell lung cancer ([place]) 04/2020   also has history of skin cancer    Medications:  Scheduled:   vitamin C  500 mg Oral BID   feeding supplement  237 mL Oral TID BM   insulin aspart  0-15 Units Subcutaneous TID WC   insulin aspart  0-5 Units Subcutaneous QHS    insulin glargine-yfgn  5 Units Subcutaneous QHS   ipratropium-albuterol  3 mL Nebulization BID   multivitamin  1 tablet Oral Daily   polyethylene glycol  17 g Oral Daily   pravastatin  80 mg Oral QHS   pregabalin  50 mg Oral BID   senna  1 tablet Oral Daily   sodium chloride flush  3 mL Intravenous Q12H    Assessment: 82 y/o male with h/o CHF, COPD, DM, HLD, HTN, CAD, pacemaker, prostate cancer, SCLC s/p XRT, CKD III and blisters on bilateral feet who is admitted with foot infection and cellulitis. Patient is being transitioned to heparin from Eliquis after speaking with primary service to facilitate option of surgery   apixaban last dose 10/25/22 2157  Goal of Therapy:  aPTT 66 - 102  seconds Monitor platelets by anticoagulation protocol: Yes   Plan: aPTT = 145, elevated Will hold heparin drip for 1 hr and restart heparin drip on 1/8 @  0600 at 750 units/hr.  - HL is elevated from Eliquis PTA so will continue to use aPTT to guide dosing ----Check aPTT 8 hours after rate change (check anti-Xa level once daily for correlation)  ---Continue to monitor H&H and platelets  Dallie Piles, PharmD 10/27/2022,7:05 AM

## 2022-10-27 NOTE — Progress Notes (Signed)
[place] for heparin infusion initiation and monitoring Indication: atrial fibrillation  Allergies  Allergen Reactions   Contrast Media [Iodinated Contrast Media] Hives and Rash   Ace Inhibitors Other (See Comments)    Unknown reaction type   Latex Rash    Patient Measurements: Height: 5\' 8"  (172.7 cm) Weight: 93.6 kg (206 lb 5.6 oz) IBW/kg (Calculated) : 68.4 Heparin Dosing Weight: 87.3 kg  Vital Signs: Temp: 97.8 F (36.6 C) (01/08 1947) Temp Source: Axillary (01/08 1947) BP: 98/83 (01/08 1947) Pulse Rate: 73 (01/08 1947)  Labs: Recent Labs    10/25/22 0435 10/26/22 0428 10/26/22 0913 10/26/22 1813 10/27/22 0355 10/27/22 1436 10/27/22 2239  HGB 12.1*  --   --   --   --   --   --   HCT 39.9  --   --   --   --   --   --   PLT 171  --   --   --   --   --   --   APTT  --   --  41*   < > 145* 81* 81*  LABPROT  --   --  19.3*  --   --   --   --   INR  --   --  1.6*  --   --   --   --   HEPARINUNFRC  --   --   --   --  >1.10*  --   --   CREATININE 1.69* 1.68*  --   --  1.60*  --   --    < > = values in this interval not displayed.     Estimated Creatinine Clearance: 38.8 mL/min (A) (by C-G formula based on SCr of 1.6 mg/dL (H)).   Medical History: Past Medical History:  Diagnosis Date   Arrhythmia    PAF   Arthritis    Cancer (HCC)    prostate   CHF (congestive heart failure) (HCC)    Chronic kidney disease    COPD (chronic obstructive pulmonary disease) (HCC)    Coronary artery disease    Diabetes mellitus without complication (HCC)    Dyspnea    with minimal exertion   Dysrhythmia    history of SVT   History of kidney stones    History of radiation therapy    lung   Hyperkalemia 11/19/2017   Hyperlipidemia    Hypertension    Peripheral vascular disease (HCC)    Prostate cancer (HCC)    Squamous cell lung cancer ([place]) 04/2020   also has history of skin cancer    Medications:  Scheduled:   vitamin C   500 mg Oral BID   feeding supplement  237 mL Oral TID BM   insulin aspart  0-15 Units Subcutaneous TID WC   insulin aspart  0-5 Units Subcutaneous QHS   insulin glargine-yfgn  5 Units Subcutaneous QHS   multivitamin  1 tablet Oral Daily   polyethylene glycol  17 g Oral Daily   pravastatin  80 mg Oral QHS   pregabalin  50 mg Oral BID   senna  1 tablet Oral Daily   sodium chloride flush  3 mL Intravenous Q12H    Assessment: 82 y/o male with h/o CHF, COPD, DM, HLD, HTN, CAD, pacemaker, prostate cancer, SCLC s/p XRT, CKD III and blisters on bilateral feet who is admitted with foot infection and cellulitis. Patient is being transitioned to heparin from Eliquis after  speaking with primary service to facilitate option of surgery   apixaban last dose 10/25/22 2157  Goal of Therapy:  aPTT 66 - 102  seconds Monitor platelets by anticoagulation protocol: Yes   1/7 @ 1813: aPTT >130 sec, supratherapeutic @ 1300 u/hr 1/8 @ 0355: aPTT= 145 sec, HL = > 1.10 1/8 @ 1436: aPTT= 81 sec 1/8 2239 aPTT = 81  Plan:  Therapeutic x 2 - Will continue current heparin infusion at 750 units/hr.  - HL is elevated from Eliquis PTA so will continue to use aPTT to guide dosing - aPTT daily with AM labs while therapeutic - Continue to monitor H&H and platelets  Renda Rolls, PharmD, [facility] 10/27/2022 11:45 PM

## 2022-10-27 NOTE — Progress Notes (Signed)
[place] for heparin infusion initiation and monitoring Indication: atrial fibrillation  Allergies  Allergen Reactions   Contrast Media [Iodinated Contrast Media] Hives and Rash   Ace Inhibitors Other (See Comments)    Unknown reaction type   Latex Rash    Patient Measurements: Height: 5\' 8"  (172.7 cm) Weight: 95 kg (209 lb 7 oz) IBW/kg (Calculated) : 68.4 Heparin Dosing Weight: 87.3 kg  Vital Signs: Temp: 98.6 F (37 C) (01/08 0349) Temp Source: Axillary (01/07 1919) BP: 112/68 (01/08 0349) Pulse Rate: 66 (01/08 0349)  Labs: Recent Labs    10/25/22 0435 10/26/22 0428 10/26/22 0913 10/26/22 1813 10/27/22 0355  HGB 12.1*  --   --   --   --   HCT 39.9  --   --   --   --   PLT 171  --   --   --   --   APTT  --   --  41* 175* 145*  LABPROT  --   --  19.3*  --   --   INR  --   --  1.6*  --   --   HEPARINUNFRC  --   --   --   --  >1.10*  CREATININE 1.69* 1.68*  --   --  1.60*     Estimated Creatinine Clearance: 39.1 mL/min (A) (by C-G formula based on SCr of 1.6 mg/dL (H)).   Medical History: Past Medical History:  Diagnosis Date   Arrhythmia    PAF   Arthritis    Cancer ([place])    prostate   CHF (congestive heart failure) (HCC)    Chronic kidney disease    COPD (chronic obstructive pulmonary disease) (HCC)    Coronary artery disease    Diabetes mellitus without complication (HCC)    Dyspnea    with minimal exertion   Dysrhythmia    history of SVT   History of kidney stones    History of radiation therapy    lung   Hyperkalemia 11/19/2017   Hyperlipidemia    Hypertension    Peripheral vascular disease (HCC)    Prostate cancer (HCC)    Squamous cell lung cancer ([place]) 04/2020   also has history of skin cancer    Medications:  Scheduled:   vitamin C  500 mg Oral BID   feeding supplement  237 mL Oral TID BM   insulin aspart  0-15 Units Subcutaneous TID WC   insulin aspart  0-5 Units Subcutaneous QHS   insulin  glargine-yfgn  5 Units Subcutaneous QHS   ipratropium-albuterol  3 mL Nebulization BID   multivitamin  1 tablet Oral Daily   polyethylene glycol  17 g Oral Daily   pravastatin  80 mg Oral QHS   pregabalin  50 mg Oral BID   senna  1 tablet Oral Daily   sodium chloride flush  3 mL Intravenous Q12H    Assessment: 82 y/o male with h/o CHF, COPD, DM, HLD, HTN, CAD, pacemaker, prostate cancer, SCLC s/p XRT, CKD III and blisters on bilateral feet who is admitted with foot infection and cellulitis. Patient is being transitioned to heparin from Eliquis after speaking with primary service to facilitate option of surgery   apixaban last dose 10/25/22 2157  Goal of Therapy:  aPTT 66 - 102  seconds Monitor platelets by anticoagulation protocol: Yes   1/7 @ 1813: aPTT >130 sec, supratherapeutic @ 1300 u/hr 1/8 @ 0355: aPTT= 145 sec, HL = >  1.10  Plan:  1/8:  aPTT = 145, elevated Will hold heparin drip for 1 hr and restart heparin drip on 1/8 @ 0600 at 750 units/hr.  - HL is elevated from Eliquis PTA so will continue to use aPTT to guide dosing ----Check aPTT 8 hours after rate change (check anti-Xa level once daily for correlation)  ---Continue to monitor H&H and platelets  Brinlee Gambrell D, PharmD 10/27/2022,4:51 AM

## 2022-10-27 NOTE — Progress Notes (Addendum)
PROGRESS NOTE    Carlos Levine  CMK:[id number] DOB: 12/04/1940 DOA: 10/22/2022 PCP: [name], MD  Outpatient Specialists: cardiology, oncology    Brief Narrative:   From admission h and p  Carlos Levine is a 82 y.o. male with medical history significant of hypertension, hyperlipidemia, Dm2 with CKD stage 3, sick sinus syndrome, chronic CHFrEF, Pafib, Elevated troponin, hx of prostate cancer w recent admission for cellulitis apparently presents due to worsening foot swelling and redness.  Pt was told by cardiologist to come to ED.  Note that patient declined rehab during recent admission and went home.  No medication change since discharge other than couldn't get midodrine at the pharmacy.   Note prior ultrasound 10/01/2022 during recent admission negative for DVT  Assessment & Plan:   Principal Problem:   Cellulitis in diabetic foot ([place]) Active Problems:   Stage 3b chronic kidney disease (CKD) (HCC)   PAF (paroxysmal atrial fibrillation) (HCC)   Benign essential hypertension   Type 2 diabetes mellitus with diabetic chronic kidney disease (HCC)   Pure hypercholesterolemia   Squamous cell lung cancer, right (HCC)   Sick sinus syndrome (HCC)   Normocytic anemia   Cellulitis   HFrEF (heart failure with reduced ejection fraction) ([place])   Peripheral edema  # Diabetic foot infection PAD contributing. Edema is a chronic problem last several months. PVLs negative for DVT in November.  X-ray no signs osteo, crp is wnl. Infection appears to be improving. blood cultures growing enterobacter cloacae resistant to cefazolin, now today also groing Stenotrophomonas. Infection is improving, erythema resolved, swelling much improved - continue vanc/cefepime, will hold flagyl, based on culture results could consider oral with levo or bactrim - query pharm ID regarding tx of Stenotrophomonas  - diuresis as below - wound care consulted   # PAD Abnormal arterial dopplers. Vascular  surgery consulted. Angiogram shows severe b/l disease. Per vascular if we proceed will need b/l femoral endarterectomies w/ stenting of the SFA and above-knee popliteal arteries. Will require a lengthy surgery. Cardiology has been asked to weigh in regarding perioperative risks/benefits, on 1/7 they say he is optimized to the extent he can be, cleared to proceed w/ procedure.  # HFrEF, biventricular Recent EF 20%, here recovered to 35-40, with moderately-reduced RV function. recent admit for CHF. Edema has worsened since then. Edema improving. Cr and BUN have risen - holding lasix given up-trending cr - strict I/os - cardiology following - cont to hold home farxiga given lower extremity infection  # Pleural effusion Chronic. Normal o2 sat on room air - diuresis as above  # A-fib, paroxysmal HR currently controlled - holding apixaban, started heparin 1/7, in anticipation of vascular surgery  # CAD No chest pain - cont home statin  # T2DM Glucose mild elevation - continue SSI, semglee 5 qhs  # Neuropathy - cont home pregabalin  # CKD 3b Kidney function at baseline though cr rising a bit - pausing lasix as above  # Sick sinus Has pacemaker  # Lung cancer In remission, surveilled by oncology    DVT prophylaxis: heparin Code Status: dnr Family Communication: wife updated telephonically 1/8  Level of care: Telemetry Medical Status is: Inpatient Remains inpatient appropriate because: severity of illness Dispo: likely snf    Consultants:  Cardiology, vascular surgery  Procedures: angiogram  Antimicrobials:  See above    Subjective: No complaints  Objective: Vitals:   10/27/22 0349 10/27/22 0500 10/27/22 0723 10/27/22 0815  BP: 112/68   Marland Kitchen)  117/59  Pulse: 66   91  Resp: 16   17  Temp: 98.6 F (37 C)   98.2 F (36.8 C)  TempSrc:      SpO2: 98%  96% 94%  Weight:  93.6 kg    Height:        Intake/Output Summary (Last 24 hours) at 10/27/2022 1349 Last  data filed at 10/27/2022 1000 Gross per 24 hour  Intake 1147.56 ml  Output 1600 ml  Net -452.44 ml   Filed Weights   10/25/22 0303 10/26/22 0434 10/27/22 0500  Weight: 92.3 kg 95 kg 93.6 kg    Examination:  General exam: Appears calm and comfortable, confused after surgery Respiratory system: Clear to auscultation. Decreased breath sounds in bases Cardiovascular system: S1 & S2 heard, rr, distant heart sounds, systolic murmur Gastrointestinal system: Abdomen is nondistended, soft and nontender. No organomegaly or masses felt. Normal bowel sounds heard. Central nervous system: Alert and oriented. No focal neurological deficits. Extremities: pitting edema up legs, improving Skin: interval improvement in erythema and swelling LLE, ulcer remains dorsum left foot Psychiatry: Judgement and insight appear normal. Mood & affect appropriate.     Data Reviewed: I have personally reviewed following labs and imaging studies  CBC: Recent Labs  Lab 10/22/22 1406 10/23/22 0124 10/24/22 0432 10/25/22 0435  WBC 6.7 7.5 6.6 4.3  NEUTROABS 5.0  --  4.8  --   HGB 11.3* 11.3* 11.5* 12.1*  HCT 36.7* 37.1* 37.8* 39.9  MCV 100.5* 101.1* 101.3* 99.0  PLT 183 170 165 151   Basic Metabolic Panel: Recent Labs  Lab 10/23/22 0124 10/24/22 0432 10/25/22 0435 10/26/22 0428 10/27/22 0355  NA 139 138 138 139 139  K 4.6 4.2 4.7 4.8 4.7  CL 98 100 98 99 102  CO2 31 27 26 27 26   GLUCOSE 131* 87 236* 106* 162*  BUN 72* 68* 79* 88* 88*  CREATININE 1.44* 1.47* 1.69* 1.68* 1.60*  CALCIUM 8.4* 8.1* 8.5* 8.3* 8.2*  MG  --  2.4 2.6*  --   --   PHOS  --  5.6* 6.4*  --   --    GFR: Estimated Creatinine Clearance: 38.8 mL/min (A) (by C-G formula based on SCr of 1.6 mg/dL (H)). Liver Function Tests: Recent Labs  Lab 10/22/22 1406 10/23/22 0124  AST 38 34  ALT 32 31  ALKPHOS 57 54  BILITOT 1.1 1.0  PROT 6.7 6.6  ALBUMIN 3.4* 3.2*   No results for input(s): "LIPASE", "AMYLASE" in the last 168  hours. No results for input(s): "AMMONIA" in the last 168 hours. Coagulation Profile: Recent Labs  Lab 10/26/22 0913  INR 1.6*   Cardiac Enzymes: No results for input(s): "CKTOTAL", "CKMB", "CKMBINDEX", "TROPONINI" in the last 168 hours. BNP (last 3 results) No results for input(s): "PROBNP" in the last 8760 hours. HbA1C: No results for input(s): "HGBA1C" in the last 72 hours. CBG: Recent Labs  Lab 10/26/22 1233 10/26/22 1714 10/26/22 2101 10/27/22 0816 10/27/22 1130  GLUCAP 198* 225* 115* 152* 206*   Lipid Profile: No results for input(s): "CHOL", "HDL", "LDLCALC", "TRIG", "CHOLHDL", "LDLDIRECT" in the last 72 hours. Thyroid Function Tests: No results for input(s): "TSH", "T4TOTAL", "FREET4", "T3FREE", "THYROIDAB" in the last 72 hours. Anemia Panel: No results for input(s): "VITAMINB12", "FOLATE", "FERRITIN", "TIBC", "IRON", "RETICCTPCT" in the last 72 hours. Urine analysis:    Component Value Date/Time   COLORURINE YELLOW (A) 10/22/2022 2251   APPEARANCEUR CLEAR (A) 10/22/2022 2251   LABSPEC 1.012 10/22/2022 2251  PHURINE 5.0 10/22/2022 2251   GLUCOSEU 150 (A) 10/22/2022 2251   HGBUR NEGATIVE 10/22/2022 2251   BILIRUBINUR NEGATIVE 10/22/2022 2251   KETONESUR NEGATIVE 10/22/2022 2251   PROTEINUR NEGATIVE 10/22/2022 2251   NITRITE NEGATIVE 10/22/2022 2251   LEUKOCYTESUR NEGATIVE 10/22/2022 2251   Sepsis Labs: @LABRCNTIP (procalcitonin:4,lacticidven:4)  ) Recent Results (from the past 240 hour(s))  Culture, blood (Routine X 2) w Reflex to ID Panel     Status: None (Preliminary result)   Collection Time: 10/23/22  2:43 PM   Specimen: BLOOD  Result Value Ref Range Status   Specimen Description BLOOD BLOOD LEFT HAND  Final   Special Requests BOTTLES DRAWN AEROBIC AND ANAEROBIC BCLV  Final   Culture   Final    NO GROWTH 4 DAYS Performed at [facility], [address] [place], [place] [address]    Report Status PENDING  Incomplete  Culture, blood  (Routine X 2) w Reflex to ID Panel     Status: None (Preliminary result)   Collection Time: 10/23/22  3:30 PM   Specimen: BLOOD  Result Value Ref Range Status   Specimen Description BLOOD BLOOD RIGHT HAND  Final   Special Requests BOTTLES DRAWN AEROBIC AND ANAEROBIC BCLV  Final   Culture   Final    NO GROWTH 4 DAYS Performed at [facility], [address] [place], [place] [address]    Report Status PENDING  Incomplete  Aerobic Culture w Gram Stain (superficial specimen)     Status: None (Preliminary result)   Collection Time: 10/23/22  4:44 PM   Specimen: Wound  Result Value Ref Range Status   Specimen Description WOUND  Final   Special Requests FOOT  Final   Gram Stain   Final    FEW WBC PRESENT, PREDOMINANTLY PMN FEW GRAM POSITIVE COCCI IN PAIRS RARE BUDDING YEAST SEEN RARE GRAM NEGATIVE RODS    Culture   Final    MODERATE ENTEROBACTER CLOACAE MODERATE STENOTROPHOMONAS MALTOPHILIA CULTURE REINCUBATED FOR BETTER GROWTH    Report Status PENDING  Incomplete   Organism ID, Bacteria ENTEROBACTER CLOACAE  Final   Organism ID, Bacteria STENOTROPHOMONAS MALTOPHILIA  Final      Susceptibility   Enterobacter cloacae - MIC*    CEFAZOLIN >=64 RESISTANT Resistant     CEFEPIME <=0.12 SENSITIVE Sensitive     CEFTAZIDIME <=1 SENSITIVE Sensitive     CIPROFLOXACIN <=0.25 SENSITIVE Sensitive     GENTAMICIN <=1 SENSITIVE Sensitive     IMIPENEM 0.5 SENSITIVE Sensitive     TRIMETH/SULFA <=20 SENSITIVE Sensitive     PIP/TAZO <=4 SENSITIVE Sensitive     * MODERATE ENTEROBACTER CLOACAE   Stenotrophomonas maltophilia - MIC*    LEVOFLOXACIN 0.5 SENSITIVE Sensitive     TRIMETH/SULFA Value in next row Sensitive      <=20 SENSITIVEPerformed at [facility] [address], [place], [place] [address]    * MODERATE STENOTROPHOMONAS MALTOPHILIA         Radiology Studies: No results found.      Scheduled Meds:  vitamin C  500 mg Oral BID   feeding supplement  237 mL  Oral TID BM   insulin aspart  0-15 Units Subcutaneous TID WC   insulin aspart  0-5 Units Subcutaneous QHS   insulin glargine-yfgn  5 Units Subcutaneous QHS   ipratropium-albuterol  3 mL Nebulization BID   multivitamin  1 tablet Oral Daily   polyethylene glycol  17 g Oral Daily   pravastatin  80 mg  Oral QHS   pregabalin  50 mg Oral BID   senna  1 tablet Oral Daily   sodium chloride flush  3 mL Intravenous Q12H   Continuous Infusions:  sodium chloride     ceFEPime (MAXIPIME) IV 2 g (10/27/22 0438)   heparin 750 Units/hr (10/27/22 9937)   metronidazole 500 mg (10/27/22 1205)   vancomycin 750 mg (10/27/22 0037)     LOS: 4 days     Desma Maxim, MD [facility]   If 7PM-7AM, please contact night-coverage [email or web address] Password [email or web address] 10/27/2022, 1:49 PM

## 2022-10-28 ENCOUNTER — Inpatient Hospital Stay: Payer: Medicare Other

## 2022-10-28 ENCOUNTER — Encounter: Payer: Self-pay | Admitting: Certified Registered"

## 2022-10-28 DIAGNOSIS — E11628 Type 2 diabetes mellitus with other skin complications: Secondary | ICD-10-CM | POA: Diagnosis not present

## 2022-10-28 DIAGNOSIS — L03119 Cellulitis of unspecified part of limb: Secondary | ICD-10-CM | POA: Diagnosis not present

## 2022-10-28 LAB — BRAIN NATRIURETIC PEPTIDE: B Natriuretic Peptide: 1345.4 pg/mL — ABNORMAL HIGH (ref 0.0–100.0)

## 2022-10-28 LAB — BLOOD GAS, ARTERIAL
Acid-Base Excess: 1.5 mmol/L (ref 0.0–2.0)
Bicarbonate: 31.5 mmol/L — ABNORMAL HIGH (ref 20.0–28.0)
O2 Content: 2 L/min
O2 Saturation: 99.1 %
Patient temperature: 37
pCO2 arterial: 77 mmHg (ref 32–48)
pH, Arterial: 7.22 — ABNORMAL LOW (ref 7.35–7.45)
pO2, Arterial: 118 mmHg — ABNORMAL HIGH (ref 83–108)

## 2022-10-28 LAB — AEROBIC CULTURE W GRAM STAIN (SUPERFICIAL SPECIMEN)

## 2022-10-28 LAB — GLUCOSE, CAPILLARY
Glucose-Capillary: 135 mg/dL — ABNORMAL HIGH (ref 70–99)
Glucose-Capillary: 142 mg/dL — ABNORMAL HIGH (ref 70–99)
Glucose-Capillary: 144 mg/dL — ABNORMAL HIGH (ref 70–99)
Glucose-Capillary: 170 mg/dL — ABNORMAL HIGH (ref 70–99)
Glucose-Capillary: 59 mg/dL — ABNORMAL LOW (ref 70–99)
Glucose-Capillary: 89 mg/dL (ref 70–99)

## 2022-10-28 LAB — CULTURE, BLOOD (ROUTINE X 2)
Culture: NO GROWTH
Culture: NO GROWTH

## 2022-10-28 LAB — BLOOD GAS, VENOUS
Acid-Base Excess: 3.9 mmol/L — ABNORMAL HIGH (ref 0.0–2.0)
Bicarbonate: 33.1 mmol/L — ABNORMAL HIGH (ref 20.0–28.0)
O2 Saturation: 63.5 %
Patient temperature: 37
pCO2, Ven: 72 mmHg (ref 44–60)
pH, Ven: 7.27 (ref 7.25–7.43)
pO2, Ven: 40 mmHg (ref 32–45)

## 2022-10-28 LAB — CBC
HCT: 43.3 % (ref 39.0–52.0)
Hemoglobin: 13.2 g/dL (ref 13.0–17.0)
MCH: 31.2 pg (ref 26.0–34.0)
MCHC: 30.5 g/dL (ref 30.0–36.0)
MCV: 102.4 fL — ABNORMAL HIGH (ref 80.0–100.0)
Platelets: 124 10*3/uL — ABNORMAL LOW (ref 150–400)
RBC: 4.23 MIL/uL (ref 4.22–5.81)
RDW: 17.4 % — ABNORMAL HIGH (ref 11.5–15.5)
WBC: 7.7 10*3/uL (ref 4.0–10.5)
nRBC: 0 % (ref 0.0–0.2)

## 2022-10-28 LAB — BASIC METABOLIC PANEL
Anion gap: 11 (ref 5–15)
BUN: 83 mg/dL — ABNORMAL HIGH (ref 8–23)
CO2: 25 mmol/L (ref 22–32)
Calcium: 8.5 mg/dL — ABNORMAL LOW (ref 8.9–10.3)
Chloride: 102 mmol/L (ref 98–111)
Creatinine, Ser: 1.66 mg/dL — ABNORMAL HIGH (ref 0.61–1.24)
GFR, Estimated: 41 mL/min — ABNORMAL LOW (ref >60)
Glucose, Bld: 142 mg/dL — ABNORMAL HIGH (ref 70–99)
Potassium: 4.9 mmol/L (ref 3.5–5.1)
Sodium: 138 mmol/L (ref 135–145)

## 2022-10-28 LAB — HEPARIN LEVEL (UNFRACTIONATED): Heparin Unfractionated: 0.64 IU/mL (ref 0.30–0.70)

## 2022-10-28 LAB — APTT: aPTT: 74 seconds — ABNORMAL HIGH (ref 24–36)

## 2022-10-28 LAB — TROPONIN I (HIGH SENSITIVITY): Troponin I (High Sensitivity): 86 ng/L — ABNORMAL HIGH (ref <18)

## 2022-10-28 LAB — AMMONIA: Ammonia: 26 umol/L (ref 9–35)

## 2022-10-28 LAB — LACTIC ACID, PLASMA: Lactic Acid, Venous: 1.7 mmol/L (ref 0.5–1.9)

## 2022-10-28 MED ORDER — FUROSEMIDE 10 MG/ML IJ SOLN
60.0000 mg | Freq: Once | INTRAMUSCULAR | Status: AC
  Administered 2022-10-28: 60 mg via INTRAVENOUS
  Filled 2022-10-28: qty 8

## 2022-10-28 MED ORDER — DEXTROSE 50 % IV SOLN
12.5000 g | INTRAVENOUS | Status: AC
  Administered 2022-10-28: 12.5 g via INTRAVENOUS
  Filled 2022-10-28: qty 50

## 2022-10-28 MED ORDER — FUROSEMIDE 10 MG/ML IJ SOLN
40.0000 mg | Freq: Two times a day (BID) | INTRAMUSCULAR | Status: DC
  Administered 2022-10-28 – 2022-10-29 (×3): 40 mg via INTRAVENOUS
  Filled 2022-10-28 (×3): qty 4

## 2022-10-28 NOTE — Progress Notes (Signed)
Responded to rapid response overhead announcement, page. On arrival to room, Dr. Si Raider, Adalberto Ill, RN ICU RR, patient RN present in room. Spontaneous respirations, palpable pulse, responsive to noxious stimuli. ABG ordered, vital signs monitored.

## 2022-10-28 NOTE — Progress Notes (Signed)
  Chaplain On-Call responded to Rapid Response notification.  The RR Team was providing bedside care to stabilize the patient.  There was no family present.  Chaplain assured Staff of further availability if requested.  Chaplain Pollyann Samples M.Div., [facility]

## 2022-10-28 NOTE — Progress Notes (Addendum)
PROGRESS NOTE    Carlos Levine  IDP:[id number] DOB: April 03, 1941 DOA: 10/22/2022 PCP: [name], MD  Outpatient Specialists: cardiology, oncology    Brief Narrative:   From admission h and p  Carlos Levine is a 82 y.o. male with medical history significant of hypertension, hyperlipidemia, Dm2 with CKD stage 3, sick sinus syndrome, chronic CHFrEF, Pafib, Elevated troponin, hx of prostate cancer w recent admission for cellulitis apparently presents due to worsening foot swelling and redness.  Pt was told by cardiologist to come to ED.  Note that patient declined rehab during recent admission and went home.  No medication change since discharge other than couldn't get midodrine at the pharmacy.   Note prior ultrasound 10/01/2022 during recent admission negative for DVT  Assessment & Plan:   Principal Problem:   Cellulitis in diabetic foot ([place]) Active Problems:   Stage 3b chronic kidney disease (CKD) (HCC)   PAF (paroxysmal atrial fibrillation) (HCC)   Benign essential hypertension   Type 2 diabetes mellitus with diabetic chronic kidney disease (HCC)   Pure hypercholesterolemia   Squamous cell lung cancer, right (HCC)   Sick sinus syndrome (HCC)   Normocytic anemia   Cellulitis   HFrEF (heart failure with reduced ejection fraction) ([place])   Peripheral edema  # Acute toxic metabolic encephalopathy # Hypercarbic respiratory failure with acidosis This morning encephalopathic. CT head negative. ABG shows hypercarbica, possibly 2/2 pulm edema seen on cxr (lasix had been on hold) - now on bipap, will check vbg to assess response, is currently sleeping so will keep it on for now - transfer to progressive - pharmacy notes cefepime can cause encephalopathy so consider abx switch if hypercarbia resolved but encephalopathy persists  # Diabetic foot infection PAD contributing. Edema is a chronic problem last several months. PVLs negative for DVT in November.  X-ray no signs osteo, crp  is wnl. Infection appears to be improving. blood cultures growing enterobacter cloacae resistant to cefazolin, now today also groing Stenotrophomonas. Infection is improving, erythema resolved, swelling much improved - continue cefepime, now off flagyl as no signs necrosis, holding vanc today given above culture results. Discussed w/ ID pharm, levofloxacin or bactrim would be oral choices to treat above pathogens.   - diuresis as below - wound care consulted   # PAD Abnormal arterial dopplers. Vascular surgery consulted. Angiogram shows severe b/l disease. Per vascular if we proceed will need b/l femoral endarterectomies w/ stenting of the SFA and above-knee popliteal arteries. Will require a lengthy surgery. Cardiology has been asked to weigh in regarding perioperative risks/benefits, on 1/7 they say he is optimized to the extent he can be, cleared to proceed w/ procedure. Vascular surgery ([place]) to call wife today to discuss.  # HFrEF, biventricular Recent EF 20%, here recovered to 35-40, with moderately-reduced RV function. recent admit for CHF. Edema has worsened since then. Edema improving. Cr and BUN have risen - lasix 60 iv this morning, 40 bid ordered by cardiology - strict I/os - cardiology following - cont to hold home farxiga given lower extremity infection  # Pleural effusion Chronic. Normal o2 sat on room air. No sig effusion seen on today's CXR - diuresis as above  # A-fib, paroxysmal HR currently controlled - holding apixaban, started heparin 1/7, in anticipation of vascular surgery  # CAD No chest pain - cont home statin  # T2DM Glucose mild elevation - continue SSI, semglee 5 qhs  # Neuropathy - cont home pregabalin  # CKD 3b  Kidney function at baseline though cr rising a bit - will proceed w/ lasix given above processes  # Sick sinus Has pacemaker  # Lung cancer In remission, surveilled by oncology    DVT prophylaxis: heparin therapeutic Code  Status: dnr Family Communication: daughter updated @ bedside 1/9, wife updated telephonically 1/9, she appreciates a daily update  Level of care: Progressive Status is: Inpatient Remains inpatient appropriate because: severity of illness Dispo: likely snf    Consultants:  Cardiology, vascular surgery  Procedures: angiogram  Antimicrobials:  See above    Subjective: Tolerating bipap  Objective: Vitals:   10/28/22 0157 10/28/22 0332 10/28/22 0821 10/28/22 1130  BP:  105/67 119/68   Pulse:  82 76   Resp:  20 20 (!) 21  Temp:  (!) 96.7 F (35.9 C) 98.2 F (36.8 C)   TempSrc:  Axillary    SpO2:  93% 100% 94%  Weight: 93.7 kg     Height:        Intake/Output Summary (Last 24 hours) at 10/28/2022 1440 Last data filed at 10/28/2022 0300 Gross per 24 hour  Intake 557 ml  Output 400 ml  Net 157 ml   Filed Weights   10/26/22 0434 10/27/22 0500 10/28/22 0157  Weight: 95 kg 93.6 kg 93.7 kg    Examination:  General exam: asleep Respiratory system: Clear to auscultation. Decreased breath sounds in bases Cardiovascular system: S1 & S2 heard, rr, distant heart sounds, systolic murmur Gastrointestinal system: Abdomen is nondistended, soft and nontender. No organomegaly or masses felt. Normal bowel sounds heard. Central nervous system: Alert and oriented. No focal neurological deficits. Extremities: pitting edema up legs, improving Skin: interval improvement in erythema and swelling LLE, ulcer remains dorsum left foot Psychiatry: Judgement and insight appear normal. Mood & affect appropriate.     Data Reviewed: I have personally reviewed following labs and imaging studies  CBC: Recent Labs  Lab 10/22/22 1406 10/23/22 0124 10/24/22 0432 10/25/22 0435 10/28/22 0359  WBC 6.7 7.5 6.6 4.3 7.7  NEUTROABS 5.0  --  4.8  --   --   HGB 11.3* 11.3* 11.5* 12.1* 13.2  HCT 36.7* 37.1* 37.8* 39.9 43.3  MCV 100.5* 101.1* 101.3* 99.0 102.4*  PLT 183 170 165 171 124*   Basic  Metabolic Panel: Recent Labs  Lab 10/24/22 0432 10/25/22 0435 10/26/22 0428 10/27/22 0355 10/28/22 0359  NA 138 138 139 139 138  K 4.2 4.7 4.8 4.7 4.9  CL 100 98 99 102 102  CO2 27 26 27 26 25   GLUCOSE 87 236* 106* 162* 142*  BUN 68* 79* 88* 88* 83*  CREATININE 1.47* 1.69* 1.68* 1.60* 1.66*  CALCIUM 8.1* 8.5* 8.3* 8.2* 8.5*  MG 2.4 2.6*  --   --   --   PHOS 5.6* 6.4*  --   --   --    GFR: Estimated Creatinine Clearance: 37.4 mL/min (A) (by C-G formula based on SCr of 1.66 mg/dL (H)). Liver Function Tests: Recent Labs  Lab 10/22/22 1406 10/23/22 0124  AST 38 34  ALT 32 31  ALKPHOS 57 54  BILITOT 1.1 1.0  PROT 6.7 6.6  ALBUMIN 3.4* 3.2*   No results for input(s): "LIPASE", "AMYLASE" in the last 168 hours. Recent Labs  Lab 10/28/22 1122  AMMONIA 26   Coagulation Profile: Recent Labs  Lab 10/26/22 0913  INR 1.6*   Cardiac Enzymes: No results for input(s): "CKTOTAL", "CKMB", "CKMBINDEX", "TROPONINI" in the last 168 hours. BNP (last 3 results) No  results for input(s): "PROBNP" in the last 8760 hours. HbA1C: No results for input(s): "HGBA1C" in the last 72 hours. CBG: Recent Labs  Lab 10/27/22 1734 10/27/22 2108 10/28/22 0825 10/28/22 1019 10/28/22 1135  GLUCAP 85 90 144* 142* 170*   Lipid Profile: No results for input(s): "CHOL", "HDL", "LDLCALC", "TRIG", "CHOLHDL", "LDLDIRECT" in the last 72 hours. Thyroid Function Tests: No results for input(s): "TSH", "T4TOTAL", "FREET4", "T3FREE", "THYROIDAB" in the last 72 hours. Anemia Panel: No results for input(s): "VITAMINB12", "FOLATE", "FERRITIN", "TIBC", "IRON", "RETICCTPCT" in the last 72 hours. Urine analysis:    Component Value Date/Time   COLORURINE YELLOW (A) 10/22/2022 2251   APPEARANCEUR CLEAR (A) 10/22/2022 2251   LABSPEC 1.012 10/22/2022 2251   PHURINE 5.0 10/22/2022 2251   GLUCOSEU 150 (A) 10/22/2022 2251   HGBUR NEGATIVE 10/22/2022 2251   BILIRUBINUR NEGATIVE 10/22/2022 2251   KETONESUR  NEGATIVE 10/22/2022 2251   PROTEINUR NEGATIVE 10/22/2022 2251   NITRITE NEGATIVE 10/22/2022 2251   LEUKOCYTESUR NEGATIVE 10/22/2022 2251   Sepsis Labs: @LABRCNTIP (procalcitonin:4,lacticidven:4)  ) Recent Results (from the past 240 hour(s))  Culture, blood (Routine X 2) w Reflex to ID Panel     Status: None   Collection Time: 10/23/22  2:43 PM   Specimen: BLOOD  Result Value Ref Range Status   Specimen Description BLOOD BLOOD LEFT HAND  Final   Special Requests BOTTLES DRAWN AEROBIC AND ANAEROBIC BCLV  Final   Culture   Final    NO GROWTH 5 DAYS Performed at [facility], [address] [place], [place] [address]    Report Status 10/28/2022 FINAL  Final  Culture, blood (Routine X 2) w Reflex to ID Panel     Status: None   Collection Time: 10/23/22  3:30 PM   Specimen: BLOOD  Result Value Ref Range Status   Specimen Description BLOOD BLOOD RIGHT HAND  Final   Special Requests BOTTLES DRAWN AEROBIC AND ANAEROBIC BCLV  Final   Culture   Final    NO GROWTH 5 DAYS Performed at [facility], [address], [place], [place] [address]    Report Status 10/28/2022 FINAL  Final  Aerobic Culture w Gram Stain (superficial specimen)     Status: None   Collection Time: 10/23/22  4:44 PM   Specimen: Wound  Result Value Ref Range Status   Specimen Description   Final    WOUND Performed at [facility], [address] [place], [place] [address]    Special Requests   Final    FOOT Performed at [facility], [address] [place], [place] [address]    Gram Stain   Final    FEW WBC PRESENT, PREDOMINANTLY PMN FEW GRAM POSITIVE COCCI IN PAIRS RARE BUDDING YEAST SEEN RARE GRAM NEGATIVE RODS Performed at [facility], [place] [address] [place], [place] [address]    Culture   Final    MODERATE ENTEROBACTER CLOACAE MODERATE STENOTROPHOMONAS MALTOPHILIA MODERATE CANDIDA GUILLIERMONDII    Report Status 10/28/2022 FINAL  Final   Organism ID,  Bacteria ENTEROBACTER CLOACAE  Final   Organism ID, Bacteria STENOTROPHOMONAS MALTOPHILIA  Final      Susceptibility   Enterobacter cloacae - MIC*    CEFAZOLIN >=64 RESISTANT Resistant     CEFEPIME <=0.12 SENSITIVE Sensitive     CEFTAZIDIME <=1 SENSITIVE Sensitive     CIPROFLOXACIN <=0.25 SENSITIVE Sensitive     GENTAMICIN <=1 SENSITIVE Sensitive     IMIPENEM 0.5 SENSITIVE Sensitive  TRIMETH/SULFA <=20 SENSITIVE Sensitive     PIP/TAZO <=4 SENSITIVE Sensitive     * MODERATE ENTEROBACTER CLOACAE   Stenotrophomonas maltophilia - MIC*    LEVOFLOXACIN 0.5 SENSITIVE Sensitive     TRIMETH/SULFA <=20 SENSITIVE Sensitive     * MODERATE STENOTROPHOMONAS MALTOPHILIA         Radiology Studies: DG Chest Port 1 View  Result Date: 10/28/2022 CLINICAL DATA:  Encephalopathy EXAM: PORTABLE CHEST 1 VIEW COMPARISON:  10/22/2022, 10/01/2022 FINDINGS: Loop recorder. Stable cardiomegaly. Aortic atherosclerosis. Chronic post treatment changes within the right hemithorax with volume loss and fibrosis. Moderate right pleural effusion, similar to prior. Slightly increasing interstitial markings within the left lung. No left-sided effusion. No pneumothorax. IMPRESSION: 1. Slightly increasing interstitial markings within the left lung, which may reflect edema or atypical/viral infection. 2. Otherwise, no significant interval change. Electronically Signed   By: Davina Poke D.O.   On: 10/28/2022 10:59   CT HEAD WO CONTRAST (5MM)  Result Date: 10/28/2022 CLINICAL DATA:  Encephalopathy EXAM: CT HEAD WITHOUT CONTRAST TECHNIQUE: Contiguous axial images were obtained from the base of the skull through the vertex without intravenous contrast. RADIATION DOSE REDUCTION: This exam was performed according to the departmental dose-optimization program which includes automated exposure control, adjustment of the mA and/or kV according to patient size and/or use of iterative reconstruction technique. COMPARISON:   06/11/2017, 04/07/2019 FINDINGS: Brain: No evidence of acute infarction, hemorrhage, hydrocephalus, extra-axial collection or mass lesion/mass effect. Moderate low-density changes within the periventricular and subcortical white matter compatible with chronic microvascular ischemic change. Moderate diffuse cerebral volume loss. Vascular: Atherosclerotic calcifications involving the large vessels of the skull base. No unexpected hyperdense vessel. Skull: Normal. Negative for fracture or focal lesion. Sinuses/Orbits: No acute finding. Other: None. IMPRESSION: 1. No acute intracranial findings. 2. Chronic microvascular ischemic change and cerebral volume loss. Electronically Signed   By: Davina Poke D.O.   On: 10/28/2022 10:53        Scheduled Meds:  vitamin C  500 mg Oral BID   feeding supplement  237 mL Oral TID BM   furosemide  40 mg Intravenous BID   insulin aspart  0-15 Units Subcutaneous TID WC   insulin aspart  0-5 Units Subcutaneous QHS   insulin glargine-yfgn  5 Units Subcutaneous QHS   multivitamin  1 tablet Oral Daily   polyethylene glycol  17 g Oral Daily   pravastatin  80 mg Oral QHS   pregabalin  50 mg Oral BID   senna  1 tablet Oral Daily   sodium chloride flush  3 mL Intravenous Q12H   Continuous Infusions:  sodium chloride     ceFEPime (MAXIPIME) IV 2 g (10/28/22 0402)   heparin 750 Units/hr (10/27/22 9379)   vancomycin 750 mg (10/28/22 0004)     LOS: 5 days   Critical care time: 82 min  Desma Maxim, MD [facility]   If 7PM-7AM, please contact night-coverage [email or web address] Password [email or web address] 10/28/2022, 2:40 PM

## 2022-10-28 NOTE — Progress Notes (Signed)
PT Cancellation Note  Patient Details Name: Carlos Levine MRN: [id number] DOB: [birth year]   Cancelled Treatment:    Reason Eval/Treat Not Completed: Medical issues which prohibited therapy Rapid response called this AM for unresponsiveness. Awaiting transfer to progressive care and being placed on BiPap. Will hold and await updated activity orders.   Modell Fendrick A. Gilford Rile PT, [name] [facility]    Shiri Hodapp A Dannette Kinkaid 10/28/2022, 11:34 AM

## 2022-10-28 NOTE — Progress Notes (Signed)
[place] NOTE       Patient ID: Carlos Levine MRN: [id number] DOB/AGE: January 27, 1941 82 y.o.  Admit date: 10/22/2022 Referring Physician Dr. Laurey Arrow Primary Physician Dr. Caryl Comes Primary Cardiologist Dr. Nehemiah Massed Reason for Consultation AoCHF  HPI: Carlos Levine is an 80yoM with a PMH of HFrEF (20%, global hypo), ?low flow AS (peak grad 24.57mmHg, 2.34m/s peak vel), paroxysmal AF on dose reduced eliquis per pt preference, SSS s/p Medtronic Micra leadless PPM, CKD 3, DM2, PAD s/p L common and ext iliac stents, hx prostate cancer, hx lung cancer s/p XRT who presented to [facility] ED 10/22/22 with worsening left foot swelling and redness at the request of the heart failure clinic.  He was recently admitted from 12/13 - 12/22 for decompensated HFrEF c/b orthostasis and cellulitis of bilateral LE. Cardiology was consulted for assistance with his heart failure. He was diuresed with IV lasix and clinically improved. Echo this admission showed improved EF 35-40%, global hypo, severe 4 chamber dilation (valve eval limited). He underwent LE angiography on 1/5 which revealed extensive calcific plaques of bilateral common femoral arteries. Vascular is recommending open endarterectomy + stenting.   Interval History:  - rapid response called this AM for increased lethargy, ABG with hypercarbia, CXR with increased vascular congestion + redemonstrated moderate R pleural effusion, head CT without acute findings. Put on BIPAP + lasix 60mg  IV x1  - at my time of evaluation he is resting on BIPAP, minimally interactive. Not hypoxic, in rate controlled AF on tele (80s-90s).   Review of systems complete and found to be negative unless listed above     Past Medical History:  Diagnosis Date   Arrhythmia    PAF   Arthritis    Cancer ([place])    prostate   CHF (congestive heart failure) (HCC)    Chronic kidney disease    COPD (chronic obstructive pulmonary disease) (HCC)    Coronary artery  disease    Diabetes mellitus without complication ([place])    Dyspnea    with minimal exertion   Dysrhythmia    history of SVT   History of kidney stones    History of radiation therapy    lung   Hyperkalemia 11/19/2017   Hyperlipidemia    Hypertension    Peripheral vascular disease (HCC)    Prostate cancer ([place])    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   also has history of skin cancer    Past Surgical History:  Procedure Laterality Date   AMPUTATION FINGER / THUMB Right    BACK SURGERY  1975   removed 2 discs and fused others   LOWER EXTREMITY ANGIOGRAPHY Left 10/24/2022   Procedure: Lower Extremity Angiography;  Surgeon: Katha Cabal, MD;  Location: [place] [facility];  Service: Cardiovascular;  Laterality: Left;   PACEMAKER LEADLESS INSERTION N/A 07/25/2020   Procedure: PACEMAKER LEADLESS INSERTION;  Surgeon: Isaias Cowman, MD;  Location: [place] [facility];  Service: Cardiovascular;  Laterality: N/A;   PROSTATE CRYOABLATION     SPINAL FUSION     STENT PLACEMENT ILIAC ([place] HX) Left 2014   Left common iliac and left external iliac by Dr. Delana Meyer   VIDEO BRONCHOSCOPY WITH ENDOBRONCHIAL NAVIGATION N/A 05/30/2019   Procedure: [place], DIABETIC;  Surgeon: Ottie Glazier, MD;  Location: [facility];  Service: Thoracic;  Laterality: N/A;   VIDEO BRONCHOSCOPY WITH ENDOBRONCHIAL NAVIGATION N/A 05/28/2020   Procedure: VIDEO BRONCHOSCOPY WITH ENDOBRONCHIAL NAVIGATION;  Surgeon: Ottie Glazier,  MD;  Location: [facility];  Service: Thoracic;  Laterality: N/A;   VIDEO BRONCHOSCOPY WITH ENDOBRONCHIAL ULTRASOUND N/A 05/30/2019   Procedure: VIDEO BRONCHOSCOPY WITH ENDOBRONCHIAL ULTRASOUND, DIABETIC;  Surgeon: Ottie Glazier, MD;  Location: [facility];  Service: Thoracic;  Laterality: N/A;   VIDEO BRONCHOSCOPY WITH ENDOBRONCHIAL ULTRASOUND N/A 05/28/2020   Procedure: VIDEO BRONCHOSCOPY WITH ENDOBRONCHIAL ULTRASOUND;  Surgeon: Ottie Glazier, MD;   Location: [facility];  Service: Thoracic;  Laterality: N/A;    Medications Prior to Admission  Medication Sig Dispense Refill Last Dose   apixaban (ELIQUIS) 2.5 MG TABS tablet Take 1 tablet (2.5 mg total) by mouth every 12 (twelve) hours. 60 tablet 3 10/22/2022   ascorbic acid (VITAMIN C) 500 MG tablet Take 1 tablet (500 mg total) by mouth 2 (two) times daily. 60 tablet 1 10/22/2022   bacitracin ointment Apply topically 2 (two) times daily. Apply to blisters on legs 120 g 0 10/22/2022   bicalutamide (CASODEX) 50 MG tablet Take 1 tablet (50 mg total) by mouth daily at 12 noon. 90 tablet 3 10/22/2022   dapagliflozin propanediol (FARXIGA) 10 MG TABS tablet Take 1 tablet (10 mg total) by mouth daily. 30 tablet 1 10/22/2022   feeding supplement (ENSURE ENLIVE / ENSURE PLUS) LIQD Take 237 mLs by mouth 3 (three) times daily between meals. 237 mL 12 10/22/2022   ipratropium-albuterol (DUONEB) 0.5-2.5 (3) MG/3ML SOLN Take 3 mLs by nebulization 2 (two) times daily.   10/22/2022   Multiple Vitamin (MULTIVITAMIN WITH MINERALS) TABS tablet Take 1 tablet by mouth daily.   10/22/2022   pravastatin (PRAVACHOL) 80 MG tablet Take 80 mg by mouth at bedtime.    10/21/2022   pregabalin (LYRICA) 50 MG capsule Take 1 capsule (50 mg total) by mouth 2 (two) times daily. 10 capsule 0 10/22/2022   traZODone (DESYREL) 50 MG tablet Take 0.5 tablets (25 mg total) by mouth at bedtime as needed for sleep. 10 tablet 0 10/21/2022   zinc sulfate 220 (50 Zn) MG capsule Take 1 capsule (220 mg total) by mouth daily. 30 capsule 1 10/22/2022   bisacodyl (DULCOLAX) 5 MG EC tablet Take 1 tablet (5 mg total) by mouth daily as needed for moderate constipation. 30 tablet 0 prn at prn   furosemide (LASIX) 20 MG tablet Take 1 tablet (20 mg total) by mouth 2 (two) times daily. 60 tablet 1    midodrine (PROAMATINE) 10 MG tablet Take 1 tablet (10 mg total) by mouth 3 (three) times daily with meals. (Patient not taking: Reported on 10/22/2022) 90 tablet 1 Not Taking    polyethylene glycol (MIRALAX / GLYCOLAX) 17 g packet Take 17 g by mouth daily. (Patient not taking: Reported on 10/22/2022) 14 each 0 Not Taking   valACYclovir (VALTREX) 500 MG tablet Take 500 mg by mouth 3 (three) times daily as needed. (Patient not taking: Reported on 10/22/2022)   Not Taking    Social History   Socioeconomic History   Marital status: Married    Spouse name: [name]   Number of children: 4   Years of education: Not on file   Highest education level: Not on file  Occupational History   Occupation: Games developer    Comment: retired  Tobacco Use   Smoking status: Former    Years: 30.00    Types: Cigarettes    Quit date: 10/20/2009    Years since quitting: 13.0   Smokeless tobacco: Never  Vaping Use   Vaping Use: Never used  Substance and Sexual Activity  Alcohol use: No   Drug use: No   Sexual activity: Yes    Birth control/protection: None  Other Topics Concern   Not on file  Social History Narrative   Patient lives with wife and occasionally daughter and granddaughter.   Social Determinants of Health   Financial Resource Strain: Low Risk  (04/13/2019)   Overall Financial Resource Strain (CARDIA)    Difficulty of Paying Living Expenses: Not hard at all  Food Insecurity: No Food Insecurity (10/23/2022)   Hunger Vital Sign    Worried About Running Out of Food in the Last Year: Never true    Ran Out of Food in the Last Year: Never true  Transportation Needs: No Transportation Needs (10/23/2022)   PRAPARE - Hydrologist (Medical): No    Lack of Transportation (Non-Medical): No  Physical Activity: Unknown (04/13/2019)   Exercise Vital Sign    Days of Exercise per Week: 0 days    Minutes of Exercise per Session: Not on file  Stress: Stress Concern Present (04/13/2019)   [place]    Feeling of Stress : To some extent  Social Connections: Moderately Isolated (04/13/2019)    Social Connection and Isolation Panel [NHANES]    Frequency of Communication with Friends and Family: More than three times a week    Frequency of Social Gatherings with Friends and Family: Not on file    Attends Religious Services: Never    Active Member of Clubs or Organizations: No    Attends Archivist Meetings: Never    Marital Status: Married  Human resources officer Violence: Not At Risk (10/23/2022)   Humiliation, Afraid, Rape, and Kick questionnaire    Fear of Current or Ex-Partner: No    Emotionally Abused: No    Physically Abused: No    Sexually Abused: No    Family History  Problem Relation Age of Onset   Lung cancer Father    Pancreatic cancer Father    Brain cancer Mother    Stomach cancer Paternal Aunt    Stomach cancer Paternal Uncle    Cancer Maternal Grandmother    Kidney cancer Maternal Grandfather       Intake/Output Summary (Last 24 hours) at 10/28/2022 1017 Last data filed at 10/28/2022 0300 Gross per 24 hour  Intake 557 ml  Output 400 ml  Net 157 ml     Vitals:   10/27/22 1947 10/28/22 0157 10/28/22 0332 10/28/22 0821  BP: 98/83  105/67 119/68  Pulse: 73  82 76  Resp: 20  20 20   Temp: 97.8 F (36.6 C)  (!) 96.7 F (35.9 C) 98.2 F (36.8 C)  TempSrc: Axillary  Axillary   SpO2: 100%  93% 100%  Weight:  93.7 kg    Height:        PHYSICAL EXAM General: Elderly and ill-appearing Caucasian male, laying in bed on BIPAP HEENT:  Normocephalic and atraumatic. Neck:  + JVD.  Lungs: on bipap, decreased bibasilar breath sounds Heart: irregularly irregular with controlled rate .  Distant heart sounds. normal S1 and S2 with 2/6 systolic murmur best heard at RUSB Abdomen: Mildly distended appearing.  Msk: Normal strength and tone for age. Extremities:  trace bilateral LE edema, kerlix dressing on both ankles. Edema and erythema much improved  Neuro: somnolent, minimally interactive. Blinks eyes open  Psych: unable to assess  Labs: Basic Metabolic  Panel: Recent Labs    10/27/22 0355 10/28/22 0359  NA 139 138  K 4.7 4.9  CL 102 102  CO2 26 25  GLUCOSE 162* 142*  BUN 88* 83*  CREATININE 1.60* 1.66*  CALCIUM 8.2* 8.5*    Liver Function Tests: No results for input(s): "AST", "ALT", "ALKPHOS", "BILITOT", "PROT", "ALBUMIN" in the last 72 hours.  No results for input(s): "LIPASE", "AMYLASE" in the last 72 hours. CBC: Recent Labs    10/28/22 0359  WBC 7.7  HGB 13.2  HCT 43.3  MCV 102.4*  PLT 124*    Cardiac Enzymes: No results for input(s): "CKTOTAL", "CKMB", "CKMBINDEX", "TROPONINIHS" in the last 72 hours. BNP: No results for input(s): "BNP" in the last 72 hours.  D-Dimer: No results for input(s): "DDIMER" in the last 72 hours. Hemoglobin A1C: No results for input(s): "HGBA1C" in the last 72 hours. Fasting Lipid Panel: No results for input(s): "CHOL", "HDL", "LDLCALC", "TRIG", "CHOLHDL", "LDLDIRECT" in the last 72 hours. Thyroid Function Tests: No results for input(s): "TSH", "T4TOTAL", "T3FREE", "THYROIDAB" in the last 72 hours.  Invalid input(s): "FREET3" Anemia Panel: No results for input(s): "VITAMINB12", "FOLATE", "FERRITIN", "TIBC", "IRON", "RETICCTPCT" in the last 72 hours.   Radiology: ECHOCARDIOGRAM COMPLETE  Result Date: 10/25/2022    ECHOCARDIOGRAM REPORT   Patient Name:   Carlos Levine Date of Exam: 10/24/2022 Medical Rec #:  [id number]      Height:       68.0 in Accession #:    [id number]     Weight:       201.5 lb Date of Birth:  01-01-1941      BSA:          2.050 m Patient Age:    47 years       BP:           92/72 mmHg Patient Gender: M              HR:           94 bpm. Exam Location:  [facility] Procedure: 2D Echo, Cardiac Doppler and Color Doppler Indications:     CHF--acute systolic J69.67  History:         Patient has prior history of Echocardiogram examinations, most                  recent 05/29/2019. CHF, COPD; Risk Factors:Hypertension.  Sonographer:     Sherrie Sport Referring Phys:  [id number] [place] Lylia Karn Diagnosing Phys: Neoma Laming  Sonographer Comments: Technically difficult study due to poor echo windows, no apical window and no parasternal window. Image acquisition challenging due to uncooperative patient. IMPRESSIONS  1. Left ventricular ejection fraction, by estimation, is 35 to 40%. The left ventricle has moderately decreased function. The left ventricle demonstrates global hypokinesis. The left ventricular internal cavity size was severely dilated. There is mild left ventricular hypertrophy. Left ventricular diastolic function could not be evaluated.  2. Right ventricular systolic function is moderately reduced. The right ventricular size is severely enlarged. Mildly increased right ventricular wall thickness.  3. Left atrial size was severely dilated.  4. Right atrial size was severely dilated.  5. The mitral valve was not well visualized. No evidence of mitral valve regurgitation.  6. The aortic valve was not well visualized. Aortic valve regurgitation is not visualized. Aortic valve sclerosis/calcification is present, without any evidence of aortic stenosis.  7. Aortic unable to see. FINDINGS  Left Ventricle: Left ventricular ejection fraction, by estimation, is 35 to 40%. The left ventricle has moderately decreased function. The left  ventricle demonstrates global hypokinesis. The left ventricular internal cavity size was severely dilated. There is mild left ventricular hypertrophy. Left ventricular diastolic function could not be evaluated. Right Ventricle: The right ventricular size is severely enlarged. Mildly increased right ventricular wall thickness. Right ventricular systolic function is moderately reduced. Left Atrium: Left atrial size was severely dilated. Right Atrium: Right atrial size was severely dilated. Pericardium: The pericardium was not assessed. Mitral Valve: The mitral valve was not well visualized. No evidence of mitral valve regurgitation. Tricuspid Valve: The  tricuspid valve is not assessed. Tricuspid valve regurgitation is not demonstrated. Aortic Valve: The aortic valve was not well visualized. Aortic valve regurgitation is not visualized. Aortic valve sclerosis/calcification is present, without any evidence of aortic stenosis. Pulmonic Valve: The pulmonic valve was not assessed. Pulmonic valve regurgitation is not visualized. Aorta: Unable to see. IAS/Shunts: The interatrial septum was not assessed.  LEFT VENTRICLE PLAX 2D LVIDd:         4.50 cm LVIDs:         3.70 cm LV PW:         1.20 cm LV IVS:        1.10 cm  LEFT ATRIUM         Index LA diam:    5.10 cm 2.49 cm/m Neoma Laming Electronically signed by Neoma Laming Signature Date/Time: 10/25/2022/9:08:14 AM    Final    PERIPHERAL VASCULAR CATHETERIZATION  Result Date: 10/24/2022 See surgical note for result.  US ARTERIAL LOWER EXTREMITY DUPLEX BILATERAL  Result Date: 10/23/2022 CLINICAL DATA:  82 year old male with a infection EXAM: NONINVASIVE PHYSIOLOGIC VASCULAR STUDY OF BILATERAL LOWER EXTREMITIES TECHNIQUE: Evaluation of both lower extremities was performed at rest, including calculation of ankle-brachial indices, directed duplex COMPARISON:  None Available. FINDINGS: Right ABI:  Not acquired Left ABI:  Not acquired Right Lower Extremity: Directed duplex of the right lower extremity demonstrates triphasic waveform of the common femoral artery, profunda femoris and proximal SFA. Distal SFA and popliteal artery demonstrate monophasic waveform. Posterior tibial artery and anterior tibial artery monophasic. No elevated velocity identified. Left Lower Extremity: Directed duplex of the left lower extremity demonstrates monophasic postobstructive waveform of the common femoral artery, profunda femoris, SFA, popliteal artery. Monophasic posterior tibial artery and anterior tibial artery. The imaged portions of the SFA are patent. No elevated velocities. IMPRESSION: Right: Directed duplex of the right lower  extremity demonstrates evidence of multifocal SFA disease, contributing to monophasic popliteal and tibial artery waveforms. Left: Directed duplex of the left lower extremity demonstrates occlusive disease of the iliac system, with patency of the left femoropopliteal system, monophasic waveforms throughout. Signed, Dulcy Fanny. Nadene Rubins, RPVI [facility] Electronically Signed   By: Corrie Mckusick D.O.   On: 10/23/2022 14:12   DG Foot 2 Views Left  Result Date: 10/23/2022 CLINICAL DATA:  Left foot blisters and skin breakdown. EXAM: LEFT FOOT - 2 VIEW COMPARISON:  None Available. FINDINGS: No bony destruction or periosteal reaction. No acute fracture or dislocation. Joint spaces are preserved. Bone mineralization is normal. Dorsal foot soft tissue swelling. IMPRESSION: 1. Dorsal foot soft tissue swelling. No acute osseous abnormality. Electronically Signed   By: Titus Dubin M.D.   On: 10/23/2022 11:30   CT CHEST WO CONTRAST  Result Date: 10/23/2022 CLINICAL DATA:  Right-sided pleural effusion EXAM: CT CHEST WITHOUT CONTRAST TECHNIQUE: Multidetector CT imaging of the chest was performed following the standard protocol without IV contrast. RADIATION DOSE REDUCTION: This exam was performed  according to the departmental dose-optimization program which includes automated exposure control, adjustment of the mA and/or kV according to patient size and/or use of iterative reconstruction technique. COMPARISON:  CT 04/01/2022, chest x-ray from the previous day. FINDINGS: Cardiovascular: Limited due to lack of IV contrast. Atherosclerotic calcifications are noted. Heavy coronary calcifications are seen. Heart is not significantly enlarged in size. Lead less pacemaker is noted within the distal aspect of the right ventricle. Mediastinum/Nodes: Thoracic inlet is within normal limits. No hilar or mediastinal adenopathy is noted. The esophagus as visualized is  within normal limits. Lungs/Pleura: Mild emphysematous changes are noted in the left lung. No focal infiltrate or sizable effusion is seen. No sizable parenchymal nodules are noted. On the right there is a large pleural effusion which has increased in the interval from the prior exam. Lower lobe consolidation is noted as well as some upper lobe consolidation likely related to prior treatment. Fullness in the right hilum is noted stable from previous exams. Upper Abdomen: Visualized upper abdomen is within normal limits. Musculoskeletal: No rib abnormality is noted. Degenerative changes of thoracic spine are seen. IMPRESSION: Increasing consolidation in the right lower and right upper lobes when compared with the prior exam from June of 2023. Increasing right-sided pleural effusion is noted as well. Persistent volume loss in fullness in the right hilum is noted related to prior therapy. No other focal abnormality is noted. Aortic Atherosclerosis (ICD10-I70.0) and Emphysema (ICD10-J43.9). Electronically Signed   By: [name] M.D.   On: 10/23/2022 01:47   DG Chest Portable 1 View  Result Date: 10/22/2022 CLINICAL DATA:  Nonhealing left foot wound and shortness of breath. EXAM: PORTABLE CHEST 1 VIEW COMPARISON:  October 01, 2022 FINDINGS: The cardiac silhouette is mildly enlarged and unchanged in size. There is marked severity calcification of the thoracic aorta. There is stable opacification of the mid right lung and right lung base with subsequent right-sided volume loss. Mild, stable right upper lobe scarring and/or airspace disease is also seen. Chronic appearing increased lung markings are seen throughout the left lung. A stable moderate sized right-sided pleural effusion is noted. No pneumothorax is identified. Multilevel degenerative changes are seen throughout the thoracic spine. IMPRESSION: 1. Stable post treatment changes throughout the right lung with subsequent right-sided volume loss. Electronically  Signed   By: Virgina Norfolk M.D.   On: 10/22/2022 21:08   US Venous Img Lower Bilateral (DVT)  Result Date: 10/01/2022 CLINICAL DATA:  Leg pain and swelling. EXAM: BILATERAL LOWER EXTREMITY VENOUS DOPPLER ULTRASOUND TECHNIQUE: Gray-scale sonography with compression, as well as color and duplex ultrasound, were performed to evaluate the deep venous system(s) from the level of the common femoral vein through the popliteal and proximal calf veins. COMPARISON:  None Available. FINDINGS: VENOUS Normal compressibility of the common femoral, superficial femoral, and popliteal veins, as well as the visualized calf veins. Visualized portions of profunda femoral vein and great saphenous vein unremarkable. No filling defects to suggest DVT on grayscale or color Doppler imaging. Doppler waveforms show normal direction of venous flow, normal respiratory plasticity and response to augmentation. OTHER Subcutaneous soft tissue edema bilateral calf. Limitations: none IMPRESSION: Negative. Electronically Signed   By: Keane Police D.O.   On: 10/01/2022 22:48   DG Chest 2 View  Result Date: 10/01/2022 CLINICAL DATA:  Leg swelling. EXAM: CHEST - 2 VIEW COMPARISON:  06/29/2022 FINDINGS: Stable significant post treatment changes involving the right hemithorax with loss of volume and changes of radiation fibrosis. Associated right pleural effusion.  The left lung remains relatively clear. No pulmonary lesions, overt pulmonary edema or left pleural effusion. IMPRESSION: 1. Stable significant post treatment changes involving the right hemithorax. 2. The left lung remains relatively clear. No left-sided pleural effusion. Electronically Signed   By: Marijo Sanes M.D.   On: 10/01/2022 11:58    ECHO 09/08/2022 ECHOCARDIOGRAPHIC MEASUREMENTS  2D DIMENSIONS  AORTA                  Values   Normal Range   MAIN PA         Values    Normal Range                Annulus: nm*          [2.3-2.9]         PA Main: nm*       [1.5-2.1]               Aorta Sin: nm*          [3.1-3.7]    RIGHT VENTRICLE            ST Junction: nm*          [2.6-3.2]         RV Base: nm*       [<4.2]             Asc.Aorta: nm*          [2.6-3.4]          RV Mid: nm*       [<3.5]  LEFT VENTRICLE                                      RV Length: nm*       [<8.6]                 LVIDd: 5.0 cm       [4.2-5.9]    INFERIOR VENA CAVA                  LVIDs: 4.7 cm                        Max. IVC: nm*       [<=2.1]                    FS: 5.4 %        [>25]            Min. IVC: nm*                    SWT: 0.83 cm      [0.6-1.0]    ------------------                    PWT: 0.83 cm      [0.6-1.0]    nm* - not measured  LEFT ATRIUM                LA Diam: 4.6 cm       [3.0-4.0]            LA A4C Area: nm*          [<20]             LA Volume: nm*          [18-58]  _________________________________________________________________________________________  ECHOCARDIOGRAPHIC DESCRIPTIONS  AORTIC ROOT  Size: Normal             Dissection: INDETERM FOR DISSECTION  AORTIC VALVE               Leaflets: Tricuspid                   Morphology: SEVERELY THICKENED               Mobility: PARTIALLY MOBILE  LEFT VENTRICLE                   Size: Normal                        Anterior: HYPOCONTRACTILE            Contraction: Normal                         Lateral: HYPOCONTRACTILE             Closest EF: 20% (Estimated)                 Septal: HYPOCONTRACTILE              LV Masses: No Masses                       Apical: HYPOCONTRACTILE                    LVH: None                          Inferior: HYPOCONTRACTILE                                                      Posterior: HYPOCONTRACTILE           Dias.FxClass: N/A  MITRAL VALVE               Leaflets: Normal                        Mobility: Fully mobile             Morphology: THICKENED LEAFLET(S)  LEFT ATRIUM                   Size: MILDLY ENLARGED              LA Masses: No masses               IA Septum: Normal IAS  MAIN PA                   Size: Normal  PULMONIC VALVE             Morphology: Normal                        Mobility: Fully mobile  RIGHT VENTRICLE              RV Masses: No Masses                         Size: Normal              Free Wall: Normal  Contraction: Normal  TRICUSPID VALVE               Leaflets: Normal                        Mobility: Fully mobile             Morphology: Normal  RIGHT ATRIUM                   Size: Normal                        RA Other: None                RA Mass: No masses  PERICARDIUM                 Fluid: No effusion  INFERIOR VENACAVA                   Size: Normal Normal respiratory collapse  _________________________________________________________________________________________   DOPPLER ECHO and OTHER SPECIAL PROCEDURES                 Aortic: MILD AR                    SEVERE AS                         249.0 cm/sec peak vel      24.8 mmHg peak grad                         14.0 mmHg mean grad        0.80 cm^2 by DOPPLER                 Mitral: MILD MR                    No MS                         MV Inflow E Vel = nm*      MV Annulus E'Vel = nm*                         E/E'Ratio = nm*              Tricuspid: MILD TR                    No TS                         313.0 cm/sec peak TR vel   54.2 mmHg peak RV pressure              Pulmonary: MILD PR                    No PS  _________________________________________________________________________________________  INTERPRETATION  SEVERE LV SYSTOLIC DYSFUNCTION (See above)  NORMAL RIGHT VENTRICULAR SYSTOLIC FUNCTION  MILD VALVULAR REGURGITATION (See above)  SEVERE VALVULAR STENOSIS (See above)  Poor sound wave transmission significantly limited this study - pt with significant AS  and MS  _________________________________________________________________________________________  Electronically signed by      Rusty Aus, MD on  09/09/2022 08: 49 PM           Performed By: Avanell Shackleton  M, RCS     Ordering Physician: Ramonita Lab   TELEMETRY reviewed by me (LT) 10/28/2022 : AF 80s-90s  EKG reviewed by me: Ectopic atrial rhythm with rate 98 bpm  Data reviewed by me (LT) 10/28/2022: hospitalist progress note, nursing notes, cbc, bmp, bnp, cxr head ct   Principal Problem:   Cellulitis in diabetic foot ([place]) Active Problems:   Benign essential hypertension   Stage 3b chronic kidney disease (CKD) ([place])   Type 2 diabetes mellitus with diabetic chronic kidney disease ([place])   Pure hypercholesterolemia   Squamous cell lung cancer, right ([place])   Sick sinus syndrome (HCC)   PAF (paroxysmal atrial fibrillation) (HCC)   Normocytic anemia   Cellulitis   HFrEF (heart failure with reduced ejection fraction) ([place])   Peripheral edema    ASSESSMENT AND PLAN:  Carlos Locket. Whitesel is an 31yoM with a PMH of HFrEF (20%, global hypo), ?low flow AS (peak grad 24.66mmHg, 2.50m/s peak vel), paroxysmal AF on dose reduced eliquis per pt preference, SSS s/p [employer] Micra leadless PPM, CKD 3, DM2, PAD s/p L common and ext iliac stents, hx prostate cancer, hx lung cancer s/p XRT who presented to [facility] ED 10/22/22 with worsening left foot swelling and redness at the request of the heart failure clinic.  He was recently admitted from 12/13 - 12/22 for decompensated HFrEF c/b orthostasis and cellulitis of bilateral LE. Cardiology was consulted for assistance with his heart failure. He was diuresed with IV lasix and clinically improved. Echo this admission showed improved EF 35-40%, global hypo, severe 4 chamber dilation (valve eval limited). He underwent LE angiography on 1/5 which revealed extensive calcific plaques of bilateral common femoral arteries. Vascular is recommending open endarterectomy + stenting.   # acute encephalopathy  Lethargic and minimally responsive the AM of 1/9, rapid response called. ABG with hypercarbia, CXR with increased pulm  edema, head CT neg for acute finding. Diuretics were held for a day following initial clinical improvement and worsening Cr, possibly contributory to hypercarbia.  -wean bipap / O2 as tolerated, will continue diuresis below  # acute on chronic HFrEF (35-40%, prev<20%, global hypo) # dilated CM  # ?low flow AS Presents for his second admission in 1 month with worsening peripheral edema c/b LE cellulitis and nonhealing ulcers. Echo 08/2022 at his PCP's office showed an EF <20%, with mild-mod AS, possibly low flow/gradient with poor LV output. Repeat this admission showed higher EF, global hypo, and assessment of valves technically limited.  BNP 1400 on admission, clinically improved following IV diuresis and transitioned to PO lasix. Diuretics were held yesterday d/t increasing Cr. Cxr today following rapid response with worsening interstitial edema, repeat BNP 1300.  - s/p IV lasix 40mg  x 1, 20mg  Iv x 3 with initial clinical improvement.  - s/p IV lasix 60 x1 following rapid response this AM, reordered 40mg  IV BID for now  - GDMT initiation/titration limited by hypotension. Was given a dose of metoprolol XL 12.5mg  once daily on 1/3, but BP too low for more - salt and fluid restriction, strict I/O, daily weights   # LE cellulitis # severe PAD s/p left common and ext iliac stents - on abx per primary team -vascular planning for open endarterectomy + stenting. Following rapid response the AM of 1/9 requiring BIPAP, appropriateness for vascular surgery / [place] will need to be reassessed. Would recommend diuresis today and tomorrow AM and better compensation from a HF standpoint prior to proceeding with vascular procedure.   #  paroxysmal AF RVR # SSS s/p Micra PPM  In NSR with short runs of AF on tele, rate controlled in the 80s-90s.  -unable to tolerate metop -eliquis switched to heparin on 1/7 in anticipation of vascular procedure. On 2.5mg  BID at home, per the patient's preference although he does  not meet criteria for this dosing.   # AKI on CKD 3  Baseline from discharge on 12/22 44/1.16, GFR >60. On admission Cr. 1.44 and GFR 48. With Cr trent 1.69 --1.68 -- 1.60, current GFR 42  This patient's plan of care was discussed and created with Dr. Clayborn Bigness and he is in agreement.  Signed: Tristan Schroeder , PA-C 10/28/2022, 10:17 AM [facility] Cardiology

## 2022-10-28 NOTE — Progress Notes (Signed)
Pt difficult to arouse. MD notified. Vitals: 119/58, MAP 85, 76 BPM, 100% 2L, 20 RR.

## 2022-10-28 NOTE — Significant Event (Signed)
Rapid Response Event Note   Reason for Call :  unresponsive  Initial Focused Assessment:  Rapid response arrived in patient's room with patient lying in bed (head of bed elevated) surrounded by [facility] staff. RT arrived with rapid nurse and Dr. Si Raider arrived shortly after. Patient with eyes closed, only arousable to painful stimuli and even then not verbal, just scrunched face. Patient with intermittent twitching in shoulders. Able to cough. Currently on heparin infusion. Lungs diminished with bilateral rhonchi. Vital signs showed BP 133/107 MAP 115, HR 88-110 irregular, oxygen saturations 100% on 2L nasal cannula, breathing unlabored. Some upper airway congestion heard. Per [place] staff no recent opiates.  Interventions:  CBG 142. Took patient to CT. Patient 100% on room air just before left for CT. MD reviewing orders and making changes including chest xray, head CT, and ABG. No change in patient's mentation when patient back in 215.  Plan of Care:  MD updating plan of care based on test results.  Event Summary:   MD Notified: Dr. Si Raider Call Time: 10:19 Arrival Time: 10:21 End Time: 11:06  Stephanie Acre, RN

## 2022-10-28 NOTE — Progress Notes (Signed)
Circle for heparin infusion initiation and monitoring Indication: atrial fibrillation  Allergies  Allergen Reactions   Contrast Media [Iodinated Contrast Media] Hives and Rash   Ace Inhibitors Other (See Comments)    Unknown reaction type   Latex Rash    Patient Measurements: Height: 5\' 8"  (172.7 cm) Weight: 93.7 kg (206 lb 9.1 oz) IBW/kg (Calculated) : 68.4 Heparin Dosing Weight: 87.3 kg  Vital Signs: Temp: 96.7 F (35.9 C) (01/09 0332) Temp Source: Axillary (01/09 0332) BP: 105/67 (01/09 0332) Pulse Rate: 82 (01/09 0332)  Labs: Recent Labs    10/26/22 0428 10/26/22 0913 10/26/22 1813 10/27/22 0355 10/27/22 1436 10/27/22 2239 10/28/22 0359  HGB  --   --   --   --   --   --  13.2  HCT  --   --   --   --   --   --  43.3  PLT  --   --   --   --   --   --  124*  APTT  --  41*   < > 145* 81* 81* 74*  LABPROT  --  19.3*  --   --   --   --   --   INR  --  1.6*  --   --   --   --   --   HEPARINUNFRC  --   --   --  >1.10*  --   --  0.64  CREATININE 1.68*  --   --  1.60*  --   --  1.66*   < > = values in this interval not displayed.     Estimated Creatinine Clearance: 37.4 mL/min (A) (by C-G formula based on SCr of 1.66 mg/dL (H)).   Medical History: Past Medical History:  Diagnosis Date   Arrhythmia    PAF   Arthritis    Cancer (HCC)    prostate   CHF (congestive heart failure) (HCC)    Chronic kidney disease    COPD (chronic obstructive pulmonary disease) (HCC)    Coronary artery disease    Diabetes mellitus without complication (HCC)    Dyspnea    with minimal exertion   Dysrhythmia    history of SVT   History of kidney stones    History of radiation therapy    lung   Hyperkalemia 11/19/2017   Hyperlipidemia    Hypertension    Peripheral vascular disease (HCC)    Prostate cancer (HCC)    Squamous cell lung cancer ([place]) 04/2020   also has history of skin cancer    Medications:  Scheduled:   vitamin C   500 mg Oral BID   feeding supplement  237 mL Oral TID BM   insulin aspart  0-15 Units Subcutaneous TID WC   insulin aspart  0-5 Units Subcutaneous QHS   insulin glargine-yfgn  5 Units Subcutaneous QHS   multivitamin  1 tablet Oral Daily   polyethylene glycol  17 g Oral Daily   pravastatin  80 mg Oral QHS   pregabalin  50 mg Oral BID   senna  1 tablet Oral Daily   sodium chloride flush  3 mL Intravenous Q12H    Assessment: 82 y/o male with h/o CHF, COPD, DM, HLD, HTN, CAD, pacemaker, prostate cancer, SCLC s/p XRT, CKD III and blisters on bilateral feet who is admitted with foot infection and cellulitis. Patient is being transitioned to heparin from Eliquis after speaking with primary service  to facilitate option of surgery   apixaban last dose 10/25/22 2157  Goal of Therapy:  aPTT 66 - 102  seconds Monitor platelets by anticoagulation protocol: Yes   1/7 @ 1813: aPTT >130 sec, supratherapeutic @ 1300 u/hr 1/8 @ 0355: aPTT= 145 sec, HL = > 1.10 1/8 @ 1436: aPTT= 81 sec 1/8 2239 aPTT = 81 1/9 0359 aPTT 74, therapeutic x 3, HL 0.64  Plan:  Therapeutic x 3 - Will continue current heparin infusion at 750 units/hr.  - HL is elevated from Eliquis PTA so will continue to use aPTT to guide dosing - aPTT daily with AM labs while therapeutic - Continue to monitor H&H and platelets  Renda Rolls, PharmD, [facility] 10/28/2022 5:15 AM

## 2022-10-28 NOTE — Progress Notes (Signed)
Patient is a transfer from [facility]; patient came to 246 on BIPAP. Vital signs are stable and with patient baseline; telemetry monitor and continuous pulse oxymetry in place and verified with monitor tech; bedside report given to [place], Therapist, sports.

## 2022-10-29 ENCOUNTER — Encounter
Admission: EM | Disposition: A | Discharge status: Hospice | Payer: Self-pay | Source: Home / Self Care | Attending: Internal Medicine

## 2022-10-29 DIAGNOSIS — E11628 Type 2 diabetes mellitus with other skin complications: Secondary | ICD-10-CM | POA: Diagnosis not present

## 2022-10-29 DIAGNOSIS — L03119 Cellulitis of unspecified part of limb: Secondary | ICD-10-CM | POA: Diagnosis not present

## 2022-10-29 LAB — BLOOD GAS, VENOUS
Acid-Base Excess: 4.2 mmol/L — ABNORMAL HIGH (ref 0.0–2.0)
Acid-Base Excess: 5.7 mmol/L — ABNORMAL HIGH (ref 0.0–2.0)
Bicarbonate: 30.3 mmol/L — ABNORMAL HIGH (ref 20.0–28.0)
Bicarbonate: 35.4 mmol/L — ABNORMAL HIGH (ref 20.0–28.0)
O2 Saturation: 49.4 %
O2 Saturation: 88.7 %
Patient temperature: 37
Patient temperature: 37
pCO2, Ven: 50 mmHg (ref 44–60)
pCO2, Ven: 77 mmHg (ref 44–60)
pH, Ven: 7.27 (ref 7.25–7.43)
pH, Ven: 7.39 (ref 7.25–7.43)
pO2, Ven: 39 mmHg (ref 32–45)
pO2, Ven: 58 mmHg — ABNORMAL HIGH (ref 32–45)

## 2022-10-29 LAB — GLUCOSE, CAPILLARY
Glucose-Capillary: 126 mg/dL — ABNORMAL HIGH (ref 70–99)
Glucose-Capillary: 142 mg/dL — ABNORMAL HIGH (ref 70–99)
Glucose-Capillary: 165 mg/dL — ABNORMAL HIGH (ref 70–99)
Glucose-Capillary: 205 mg/dL — ABNORMAL HIGH (ref 70–99)
Glucose-Capillary: 68 mg/dL — ABNORMAL LOW (ref 70–99)
Glucose-Capillary: 82 mg/dL (ref 70–99)
Glucose-Capillary: 84 mg/dL (ref 70–99)
Glucose-Capillary: 96 mg/dL (ref 70–99)

## 2022-10-29 LAB — BASIC METABOLIC PANEL
Anion gap: 10 (ref 5–15)
BUN: 84 mg/dL — ABNORMAL HIGH (ref 8–23)
CO2: 28 mmol/L (ref 22–32)
Calcium: 8.3 mg/dL — ABNORMAL LOW (ref 8.9–10.3)
Chloride: 106 mmol/L (ref 98–111)
Creatinine, Ser: 1.65 mg/dL — ABNORMAL HIGH (ref 0.61–1.24)
GFR, Estimated: 41 mL/min — ABNORMAL LOW (ref >60)
Glucose, Bld: 90 mg/dL (ref 70–99)
Potassium: 4.5 mmol/L (ref 3.5–5.1)
Sodium: 144 mmol/L (ref 135–145)

## 2022-10-29 LAB — CBC
HCT: 42.3 % (ref 39.0–52.0)
Hemoglobin: 12.7 g/dL — ABNORMAL LOW (ref 13.0–17.0)
MCH: 30.7 pg (ref 26.0–34.0)
MCHC: 30 g/dL (ref 30.0–36.0)
MCV: 102.2 fL — ABNORMAL HIGH (ref 80.0–100.0)
Platelets: 145 10*3/uL — ABNORMAL LOW (ref 150–400)
RBC: 4.14 MIL/uL — ABNORMAL LOW (ref 4.22–5.81)
RDW: 16.9 % — ABNORMAL HIGH (ref 11.5–15.5)
WBC: 8.1 10*3/uL (ref 4.0–10.5)
nRBC: 0 % (ref 0.0–0.2)

## 2022-10-29 LAB — HEPARIN LEVEL (UNFRACTIONATED): Heparin Unfractionated: 0.55 IU/mL (ref 0.30–0.70)

## 2022-10-29 LAB — APTT: aPTT: 80 seconds — ABNORMAL HIGH (ref 24–36)

## 2022-10-29 SURGERY — ENDARTERECTOMY, FEMORAL
Anesthesia: General

## 2022-10-29 MED ORDER — DEXTROSE 10 % IV SOLN: INTRAVENOUS | Status: DC

## 2022-10-29 MED ORDER — DEXTROSE 50 % IV SOLN
12.5000 g | INTRAVENOUS | Status: AC
  Administered 2022-10-29: 12.5 g via INTRAVENOUS
  Filled 2022-10-29: qty 50

## 2022-10-29 MED ORDER — BISACODYL 5 MG PO TBEC
5.0000 mg | DELAYED_RELEASE_TABLET | Freq: Once | ORAL | Status: AC
  Administered 2022-10-29: 5 mg via ORAL
  Filled 2022-10-29: qty 1

## 2022-10-29 NOTE — Progress Notes (Signed)
[place] NOTE       Patient ID: ELVEN LABOY MRN: [id number] DOB/AGE: 27-Aug-1941 82 y.o.  Admit date: 10/22/2022 Referring Physician Dr. Laurey Arrow Primary Physician Dr. Caryl Comes Primary Cardiologist Dr. Nehemiah Massed Reason for Consultation AoCHF  HPI: Carlos Levine. Ribaudo is an 44yoM with a PMH of HFrEF (20%, global hypo), ?low flow AS (peak grad 24.79mmHg, 2.52m/s peak vel), paroxysmal AF on dose reduced eliquis per pt preference, SSS s/p Medtronic Micra leadless PPM, CKD 3, DM2, PAD s/p L common and ext iliac stents, hx prostate cancer, hx lung cancer s/p XRT who presented to [facility] ED 10/22/22 with worsening left foot swelling and redness at the request of the heart failure clinic.  He was recently admitted from 12/13 - 12/22 for decompensated HFrEF c/b orthostasis and cellulitis of bilateral LE. Cardiology was consulted for assistance with his heart failure. He was diuresed with IV lasix and clinically improved. Echo this admission showed improved EF 35-40%, global hypo, severe 4 chamber dilation (valve eval limited). He underwent LE angiography on 1/5 which revealed extensive calcific plaques of bilateral common femoral arteries. Vascular is recommending open endarterectomy + stenting.   Interval History:  - awake and alert this AM, still on BIPAP - mumbles unintelligibly. Hypercarbia improved. Net neg ~978mL and renal function stable after lasix   Review of systems limited by mental status    Past Medical History:  Diagnosis Date   Arrhythmia    PAF   Arthritis    Cancer (HCC)    prostate   CHF (congestive heart failure) (HCC)    Chronic kidney disease    COPD (chronic obstructive pulmonary disease) (HCC)    Coronary artery disease    Diabetes mellitus without complication ([place])    Dyspnea    with minimal exertion   Dysrhythmia    history of SVT   History of kidney stones    History of radiation therapy    lung   Hyperkalemia 11/19/2017   Hyperlipidemia     Hypertension    Peripheral vascular disease (HCC)    Prostate cancer ([place])    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   also has history of skin cancer    Past Surgical History:  Procedure Laterality Date   AMPUTATION FINGER / THUMB Right    BACK SURGERY  1975   removed 2 discs and fused others   LOWER EXTREMITY ANGIOGRAPHY Left 10/24/2022   Procedure: Lower Extremity Angiography;  Surgeon: Katha Cabal, MD;  Location: [place] [facility];  Service: Cardiovascular;  Laterality: Left;   PACEMAKER LEADLESS INSERTION N/A 07/25/2020   Procedure: PACEMAKER LEADLESS INSERTION;  Surgeon: Isaias Cowman, MD;  Location: [place] [facility];  Service: Cardiovascular;  Laterality: N/A;   PROSTATE CRYOABLATION     SPINAL FUSION     STENT PLACEMENT ILIAC ([place] HX) Left 2014   Left common iliac and left external iliac by Dr. Delana Meyer   VIDEO BRONCHOSCOPY WITH ENDOBRONCHIAL NAVIGATION N/A 05/30/2019   Procedure: VIDEO BRONCHOSCOPY WITH ENDOBRONCHIAL NAVIGATION, DIABETIC;  Surgeon: Ottie Glazier, MD;  Location: [facility];  Service: Thoracic;  Laterality: N/A;   VIDEO BRONCHOSCOPY WITH ENDOBRONCHIAL NAVIGATION N/A 05/28/2020   Procedure: VIDEO BRONCHOSCOPY WITH ENDOBRONCHIAL NAVIGATION;  Surgeon: Ottie Glazier, MD;  Location: [facility];  Service: Thoracic;  Laterality: N/A;   VIDEO BRONCHOSCOPY WITH ENDOBRONCHIAL ULTRASOUND N/A 05/30/2019   Procedure: VIDEO BRONCHOSCOPY WITH ENDOBRONCHIAL ULTRASOUND, DIABETIC;  Surgeon: Ottie Glazier, MD;  Location: [facility];  Service: Thoracic;  Laterality: N/A;   VIDEO BRONCHOSCOPY WITH ENDOBRONCHIAL ULTRASOUND N/A 05/28/2020   Procedure: VIDEO BRONCHOSCOPY WITH ENDOBRONCHIAL ULTRASOUND;  Surgeon: Ottie Glazier, MD;  Location: [facility];  Service: Thoracic;  Laterality: N/A;    Medications Prior to Admission  Medication Sig Dispense Refill Last Dose   apixaban (ELIQUIS) 2.5 MG TABS tablet Take 1 tablet (2.5 mg total) by mouth every 12 (twelve) hours. 60  tablet 3 10/22/2022   ascorbic acid (VITAMIN C) 500 MG tablet Take 1 tablet (500 mg total) by mouth 2 (two) times daily. 60 tablet 1 10/22/2022   bacitracin ointment Apply topically 2 (two) times daily. Apply to blisters on legs 120 g 0 10/22/2022   bicalutamide (CASODEX) 50 MG tablet Take 1 tablet (50 mg total) by mouth daily at 12 noon. 90 tablet 3 10/22/2022   dapagliflozin propanediol (FARXIGA) 10 MG TABS tablet Take 1 tablet (10 mg total) by mouth daily. 30 tablet 1 10/22/2022   feeding supplement (ENSURE ENLIVE / ENSURE PLUS) LIQD Take 237 mLs by mouth 3 (three) times daily between meals. 237 mL 12 10/22/2022   ipratropium-albuterol (DUONEB) 0.5-2.5 (3) MG/3ML SOLN Take 3 mLs by nebulization 2 (two) times daily.   10/22/2022   Multiple Vitamin (MULTIVITAMIN WITH MINERALS) TABS tablet Take 1 tablet by mouth daily.   10/22/2022   pravastatin (PRAVACHOL) 80 MG tablet Take 80 mg by mouth at bedtime.    10/21/2022   pregabalin (LYRICA) 50 MG capsule Take 1 capsule (50 mg total) by mouth 2 (two) times daily. 10 capsule 0 10/22/2022   traZODone (DESYREL) 50 MG tablet Take 0.5 tablets (25 mg total) by mouth at bedtime as needed for sleep. 10 tablet 0 10/21/2022   zinc sulfate 220 (50 Zn) MG capsule Take 1 capsule (220 mg total) by mouth daily. 30 capsule 1 10/22/2022   bisacodyl (DULCOLAX) 5 MG EC tablet Take 1 tablet (5 mg total) by mouth daily as needed for moderate constipation. 30 tablet 0 prn at prn   furosemide (LASIX) 20 MG tablet Take 1 tablet (20 mg total) by mouth 2 (two) times daily. 60 tablet 1    midodrine (PROAMATINE) 10 MG tablet Take 1 tablet (10 mg total) by mouth 3 (three) times daily with meals. (Patient not taking: Reported on 10/22/2022) 90 tablet 1 Not Taking   polyethylene glycol (MIRALAX / GLYCOLAX) 17 g packet Take 17 g by mouth daily. (Patient not taking: Reported on 10/22/2022) 14 each 0 Not Taking   valACYclovir (VALTREX) 500 MG tablet Take 500 mg by mouth 3 (three) times daily as needed. (Patient not  taking: Reported on 10/22/2022)   Not Taking    Social History   Socioeconomic History   Marital status: Married    Spouse name: [name]   Number of children: 4   Years of education: Not on file   Highest education level: Not on file  Occupational History   Occupation: Games developer    Comment: retired  Tobacco Use   Smoking status: Former    Years: 30.00    Types: Cigarettes    Quit date: 10/20/2009    Years since quitting: 13.0   Smokeless tobacco: Never  Vaping Use   Vaping Use: Never used  Substance and Sexual Activity   Alcohol use: No   Drug use: No   Sexual activity: Yes    Birth control/protection: None  Other Topics Concern   Not on file  Social History Narrative   Patient lives with wife and occasionally daughter and  granddaughter.   Social Determinants of Health   Financial Resource Strain: Low Risk  (04/13/2019)   Overall Financial Resource Strain (CARDIA)    Difficulty of Paying Living Expenses: Not hard at all  Food Insecurity: No Food Insecurity (10/23/2022)   Hunger Vital Sign    Worried About Running Out of Food in the Last Year: Never true    Ran Out of Food in the Last Year: Never true  Transportation Needs: No Transportation Needs (10/23/2022)   PRAPARE - Hydrologist (Medical): No    Lack of Transportation (Non-Medical): No  Physical Activity: Unknown (04/13/2019)   Exercise Vital Sign    Days of Exercise per Week: 0 days    Minutes of Exercise per Session: Not on file  Stress: Stress Concern Present (04/13/2019)   [place]    Feeling of Stress : To some extent  Social Connections: Moderately Isolated (04/13/2019)   Social Connection and Isolation Panel [NHANES]    Frequency of Communication with Friends and Family: More than three times a week    Frequency of Social Gatherings with Friends and Family: Not on file    Attends Religious Services: Never    Active  Member of Clubs or Organizations: No    Attends Archivist Meetings: Never    Marital Status: Married  Human resources officer Violence: Not At Risk (10/23/2022)   Humiliation, Afraid, Rape, and Kick questionnaire    Fear of Current or Ex-Partner: No    Emotionally Abused: No    Physically Abused: No    Sexually Abused: No    Family History  Problem Relation Age of Onset   Lung cancer Father    Pancreatic cancer Father    Brain cancer Mother    Stomach cancer Paternal Aunt    Stomach cancer Paternal Uncle    Cancer Maternal Grandmother    Kidney cancer Maternal Grandfather       Intake/Output Summary (Last 24 hours) at 10/29/2022 0825 Last data filed at 10/29/2022 0616 Gross per 24 hour  Intake 385.91 ml  Output 2100 ml  Net -1714.09 ml     Vitals:   10/28/22 2324 10/29/22 0447 10/29/22 0700 10/29/22 0750  BP: 101/67 106/64  125/64  Pulse: 78 88 90 95  Resp: 16 (!) 21 (!) 27 15  Temp: (!) 97.5 F (36.4 C) 97.7 F (36.5 C)  (!) 96.7 F (35.9 C)  TempSrc: Oral     SpO2: 100% 93%  96%  Weight:      Height:        PHYSICAL EXAM General: Elderly and ill-appearing Caucasian male, laying in bed on BIPAP HEENT:  Normocephalic and atraumatic. Neck:  + JVD.  Lungs: on bipap, decreased bibasilar breath sounds Heart: irregularly irregular with controlled rate .  Distant heart sounds. normal S1 and S2 with 2/6 systolic murmur best heard at RUSB Abdomen: Mildly distended appearing.  Msk: Normal strength and tone for age. Extremities:  trace bilateral LE edema, kerlix dressing on both ankles. Edema and erythema much improved  Neuro: awake and alert, mumbles unintelligibly  Psych: unable to assess  Labs: Basic Metabolic Panel: Recent Labs    10/28/22 0359 10/29/22 0039  NA 138 144  K 4.9 4.5  CL 102 106  CO2 25 28  GLUCOSE 142* 90  BUN 83* 84*  CREATININE 1.66* 1.65*  CALCIUM 8.5* 8.3*    Liver Function Tests: No results for input(s): "  AST", "ALT", "ALKPHOS",  "BILITOT", "PROT", "ALBUMIN" in the last 72 hours.  No results for input(s): "LIPASE", "AMYLASE" in the last 72 hours. CBC: Recent Labs    10/28/22 0359 10/29/22 0039  WBC 7.7 8.1  HGB 13.2 12.7*  HCT 43.3 42.3  MCV 102.4* 102.2*  PLT 124* 145*    Cardiac Enzymes: Recent Labs    10/28/22 1122  TROPONINIHS 86*   BNP: Recent Labs    10/28/22 1122  BNP 1,345.4*    D-Dimer: No results for input(s): "DDIMER" in the last 72 hours. Hemoglobin A1C: No results for input(s): "HGBA1C" in the last 72 hours. Fasting Lipid Panel: No results for input(s): "CHOL", "HDL", "LDLCALC", "TRIG", "CHOLHDL", "LDLDIRECT" in the last 72 hours. Thyroid Function Tests: No results for input(s): "TSH", "T4TOTAL", "T3FREE", "THYROIDAB" in the last 72 hours.  Invalid input(s): "FREET3" Anemia Panel: No results for input(s): "VITAMINB12", "FOLATE", "FERRITIN", "TIBC", "IRON", "RETICCTPCT" in the last 72 hours.   Radiology: [facility] Chest Port 1 View  Result Date: 10/28/2022 CLINICAL DATA:  Encephalopathy EXAM: PORTABLE CHEST 1 VIEW COMPARISON:  10/22/2022, 10/01/2022 FINDINGS: Loop recorder. Stable cardiomegaly. Aortic atherosclerosis. Chronic post treatment changes within the right hemithorax with volume loss and fibrosis. Moderate right pleural effusion, similar to prior. Slightly increasing interstitial markings within the left lung. No left-sided effusion. No pneumothorax. IMPRESSION: 1. Slightly increasing interstitial markings within the left lung, which may reflect edema or atypical/viral infection. 2. Otherwise, no significant interval change. Electronically Signed   By: Davina Poke D.O.   On: 10/28/2022 10:59   CT HEAD WO CONTRAST (5MM)  Result Date: 10/28/2022 CLINICAL DATA:  Encephalopathy EXAM: CT HEAD WITHOUT CONTRAST TECHNIQUE: Contiguous axial images were obtained from the base of the skull through the vertex without intravenous contrast. RADIATION DOSE REDUCTION: This exam was performed  according to the departmental dose-optimization program which includes automated exposure control, adjustment of the mA and/or kV according to patient size and/or use of iterative reconstruction technique. COMPARISON:  06/11/2017, 04/07/2019 FINDINGS: Brain: No evidence of acute infarction, hemorrhage, hydrocephalus, extra-axial collection or mass lesion/mass effect. Moderate low-density changes within the periventricular and subcortical white matter compatible with chronic microvascular ischemic change. Moderate diffuse cerebral volume loss. Vascular: Atherosclerotic calcifications involving the large vessels of the skull base. No unexpected hyperdense vessel. Skull: Normal. Negative for fracture or focal lesion. Sinuses/Orbits: No acute finding. Other: None. IMPRESSION: 1. No acute intracranial findings. 2. Chronic microvascular ischemic change and cerebral volume loss. Electronically Signed   By: Davina Poke D.O.   On: 10/28/2022 10:53   ECHOCARDIOGRAM COMPLETE  Result Date: 10/25/2022    ECHOCARDIOGRAM REPORT   Patient Name:   EMIT KUENZEL Date of Exam: 10/24/2022 Medical Rec #:  [id number]      Height:       68.0 in Accession #:    [id number]     Weight:       201.5 lb Date of Birth:  1941/03/13      BSA:          2.050 m Patient Age:    89 years       BP:           92/72 mmHg Patient Gender: M              HR:           94 bpm. Exam Location:  [facility] Procedure: 2D Echo, Cardiac Doppler and Color Doppler Indications:     CHF--acute systolic K44.01  History:  Patient has prior history of Echocardiogram examinations, most                  recent 05/29/2019. CHF, COPD; Risk Factors:Hypertension.  Sonographer:     Sherrie Sport Referring Phys:  [id number] [place] Stevi [place] Diagnosing Phys: Neoma Laming  Sonographer Comments: Technically difficult study due to poor echo windows, no apical window and no parasternal window. Image acquisition challenging due to uncooperative patient. IMPRESSIONS  1. Left  ventricular ejection fraction, by estimation, is 35 to 40%. The left ventricle has moderately decreased function. The left ventricle demonstrates global hypokinesis. The left ventricular internal cavity size was severely dilated. There is mild left ventricular hypertrophy. Left ventricular diastolic function could not be evaluated.  2. Right ventricular systolic function is moderately reduced. The right ventricular size is severely enlarged. Mildly increased right ventricular wall thickness.  3. Left atrial size was severely dilated.  4. Right atrial size was severely dilated.  5. The mitral valve was not well visualized. No evidence of mitral valve regurgitation.  6. The aortic valve was not well visualized. Aortic valve regurgitation is not visualized. Aortic valve sclerosis/calcification is present, without any evidence of aortic stenosis.  7. Aortic unable to see. FINDINGS  Left Ventricle: Left ventricular ejection fraction, by estimation, is 35 to 40%. The left ventricle has moderately decreased function. The left ventricle demonstrates global hypokinesis. The left ventricular internal cavity size was severely dilated. There is mild left ventricular hypertrophy. Left ventricular diastolic function could not be evaluated. Right Ventricle: The right ventricular size is severely enlarged. Mildly increased right ventricular wall thickness. Right ventricular systolic function is moderately reduced. Left Atrium: Left atrial size was severely dilated. Right Atrium: Right atrial size was severely dilated. Pericardium: The pericardium was not assessed. Mitral Valve: The mitral valve was not well visualized. No evidence of mitral valve regurgitation. Tricuspid Valve: The tricuspid valve is not assessed. Tricuspid valve regurgitation is not demonstrated. Aortic Valve: The aortic valve was not well visualized. Aortic valve regurgitation is not visualized. Aortic valve sclerosis/calcification is present, without any  evidence of aortic stenosis. Pulmonic Valve: The pulmonic valve was not assessed. Pulmonic valve regurgitation is not visualized. Aorta: Unable to see. IAS/Shunts: The interatrial septum was not assessed.  LEFT VENTRICLE PLAX 2D LVIDd:         4.50 cm LVIDs:         3.70 cm LV PW:         1.20 cm LV IVS:        1.10 cm  LEFT ATRIUM         Index LA diam:    5.10 cm 2.49 cm/m Neoma Laming Electronically signed by Neoma Laming Signature Date/Time: 10/25/2022/9:08:14 AM    Final    PERIPHERAL VASCULAR CATHETERIZATION  Result Date: 10/24/2022 See surgical note for result.  US ARTERIAL LOWER EXTREMITY DUPLEX BILATERAL  Result Date: 10/23/2022 CLINICAL DATA:  82 year old male with a infection EXAM: NONINVASIVE PHYSIOLOGIC VASCULAR STUDY OF BILATERAL LOWER EXTREMITIES TECHNIQUE: Evaluation of both lower extremities was performed at rest, including calculation of ankle-brachial indices, directed duplex COMPARISON:  None Available. FINDINGS: Right ABI:  Not acquired Left ABI:  Not acquired Right Lower Extremity: Directed duplex of the right lower extremity demonstrates triphasic waveform of the common femoral artery, profunda femoris and proximal SFA. Distal SFA and popliteal artery demonstrate monophasic waveform. Posterior tibial artery and anterior tibial artery monophasic. No elevated velocity identified. Left Lower Extremity: Directed duplex of the left lower extremity demonstrates  monophasic postobstructive waveform of the common femoral artery, profunda femoris, SFA, popliteal artery. Monophasic posterior tibial artery and anterior tibial artery. The imaged portions of the SFA are patent. No elevated velocities. IMPRESSION: Right: Directed duplex of the right lower extremity demonstrates evidence of multifocal SFA disease, contributing to monophasic popliteal and tibial artery waveforms. Left: Directed duplex of the left lower extremity demonstrates occlusive disease of the iliac system, with patency of the left  femoropopliteal system, monophasic waveforms throughout. Signed, Dulcy Fanny. Nadene Rubins, RPVI [facility] Radiology Electronically Signed   By: Corrie Mckusick D.O.   On: 10/23/2022 14:12   DG Foot 2 Views Left  Result Date: 10/23/2022 CLINICAL DATA:  Left foot blisters and skin breakdown. EXAM: LEFT FOOT - 2 VIEW COMPARISON:  None Available. FINDINGS: No bony destruction or periosteal reaction. No acute fracture or dislocation. Joint spaces are preserved. Bone mineralization is normal. Dorsal foot soft tissue swelling. IMPRESSION: 1. Dorsal foot soft tissue swelling. No acute osseous abnormality. Electronically Signed   By: Titus Dubin M.D.   On: 10/23/2022 11:30   CT CHEST WO CONTRAST  Result Date: 10/23/2022 CLINICAL DATA:  Right-sided pleural effusion EXAM: CT CHEST WITHOUT CONTRAST TECHNIQUE: Multidetector CT imaging of the chest was performed following the standard protocol without IV contrast. RADIATION DOSE REDUCTION: This exam was performed according to the departmental dose-optimization program which includes automated exposure control, adjustment of the mA and/or kV according to patient size and/or use of iterative reconstruction technique. COMPARISON:  CT 04/01/2022, chest x-ray from the previous day. FINDINGS: Cardiovascular: Limited due to lack of IV contrast. Atherosclerotic calcifications are noted. Heavy coronary calcifications are seen. Heart is not significantly enlarged in size. Lead less pacemaker is noted within the distal aspect of the right ventricle. Mediastinum/Nodes: Thoracic inlet is within normal limits. No hilar or mediastinal adenopathy is noted. The esophagus as visualized is within normal limits. Lungs/Pleura: Mild emphysematous changes are noted in the left lung. No focal infiltrate or sizable effusion is seen. No sizable parenchymal nodules are noted. On the right there is a large pleural effusion which has increased in  the interval from the prior exam. Lower lobe consolidation is noted as well as some upper lobe consolidation likely related to prior treatment. Fullness in the right hilum is noted stable from previous exams. Upper Abdomen: Visualized upper abdomen is within normal limits. Musculoskeletal: No rib abnormality is noted. Degenerative changes of thoracic spine are seen. IMPRESSION: Increasing consolidation in the right lower and right upper lobes when compared with the prior exam from June of 2023. Increasing right-sided pleural effusion is noted as well. Persistent volume loss in fullness in the right hilum is noted related to prior therapy. No other focal abnormality is noted. Aortic Atherosclerosis (ICD10-I70.0) and Emphysema (ICD10-J43.9). Electronically Signed   By: [name] M.D.   On: 10/23/2022 01:47   DG Chest Portable 1 View  Result Date: 10/22/2022 CLINICAL DATA:  Nonhealing left foot wound and shortness of breath. EXAM: PORTABLE CHEST 1 VIEW COMPARISON:  October 01, 2022 FINDINGS: The cardiac silhouette is mildly enlarged and unchanged in size. There is marked severity calcification of the thoracic aorta. There is stable opacification of the mid right lung and right lung base with subsequent right-sided volume loss. Mild, stable right upper lobe scarring and/or airspace disease is also seen. Chronic appearing increased lung markings are seen throughout the left lung. A stable moderate sized right-sided pleural effusion is noted. No pneumothorax is identified.  Multilevel degenerative changes are seen throughout the thoracic spine. IMPRESSION: 1. Stable post treatment changes throughout the right lung with subsequent right-sided volume loss. Electronically Signed   By: Virgina Norfolk M.D.   On: 10/22/2022 21:08   US Venous Img Lower Bilateral (DVT)  Result Date: 10/01/2022 CLINICAL DATA:  Leg pain and swelling. EXAM: BILATERAL LOWER EXTREMITY VENOUS DOPPLER ULTRASOUND TECHNIQUE: Gray-scale  sonography with compression, as well as color and duplex ultrasound, were performed to evaluate the deep venous system(s) from the level of the common femoral vein through the popliteal and proximal calf veins. COMPARISON:  None Available. FINDINGS: VENOUS Normal compressibility of the common femoral, superficial femoral, and popliteal veins, as well as the visualized calf veins. Visualized portions of profunda femoral vein and great saphenous vein unremarkable. No filling defects to suggest DVT on grayscale or color Doppler imaging. Doppler waveforms show normal direction of venous flow, normal respiratory plasticity and response to augmentation. OTHER Subcutaneous soft tissue edema bilateral calf. Limitations: none IMPRESSION: Negative. Electronically Signed   By: Keane Police D.O.   On: 10/01/2022 22:48   DG Chest 2 View  Result Date: 10/01/2022 CLINICAL DATA:  Leg swelling. EXAM: CHEST - 2 VIEW COMPARISON:  06/29/2022 FINDINGS: Stable significant post treatment changes involving the right hemithorax with loss of volume and changes of radiation fibrosis. Associated right pleural effusion. The left lung remains relatively clear. No pulmonary lesions, overt pulmonary edema or left pleural effusion. IMPRESSION: 1. Stable significant post treatment changes involving the right hemithorax. 2. The left lung remains relatively clear. No left-sided pleural effusion. Electronically Signed   By: Marijo Sanes M.D.   On: 10/01/2022 11:58    ECHO 09/08/2022 ECHOCARDIOGRAPHIC MEASUREMENTS  2D DIMENSIONS  AORTA                  Values   Normal Range   MAIN PA         Values    Normal Range                Annulus: nm*          [2.3-2.9]         PA Main: nm*       [1.5-2.1]              Aorta Sin: nm*          [3.1-3.7]    RIGHT VENTRICLE            ST Junction: nm*          [2.6-3.2]         RV Base: nm*       [<4.2]             Asc.Aorta: nm*          [2.6-3.4]          RV Mid: nm*       [<3.5]  LEFT VENTRICLE                                       RV Length: nm*       [<8.6]                 LVIDd: 5.0 cm       [4.2-5.9]    INFERIOR VENA CAVA                  LVIDs: 4.7  cm                        Max. IVC: nm*       [<=2.1]                    FS: 5.4 %        [>25]            Min. IVC: nm*                    SWT: 0.83 cm      [0.6-1.0]    ------------------                    PWT: 0.83 cm      [0.6-1.0]    nm* - not measured  LEFT ATRIUM                LA Diam: 4.6 cm       [3.0-4.0]            LA A4C Area: nm*          [<20]             LA Volume: nm*          [18-58]  _________________________________________________________________________________________  ECHOCARDIOGRAPHIC DESCRIPTIONS  AORTIC ROOT                   Size: Normal             Dissection: INDETERM FOR DISSECTION  AORTIC VALVE               Leaflets: Tricuspid                   Morphology: SEVERELY THICKENED               Mobility: PARTIALLY MOBILE  LEFT VENTRICLE                   Size: Normal                        Anterior: HYPOCONTRACTILE            Contraction: Normal                         Lateral: HYPOCONTRACTILE             Closest EF: 20% (Estimated)                 Septal: HYPOCONTRACTILE              LV Masses: No Masses                       Apical: HYPOCONTRACTILE                    LVH: None                          Inferior: HYPOCONTRACTILE                                                      Posterior: HYPOCONTRACTILE  Dias.FxClass: N/A  MITRAL VALVE               Leaflets: Normal                        Mobility: Fully mobile             Morphology: THICKENED LEAFLET(S)  LEFT ATRIUM                   Size: MILDLY ENLARGED              LA Masses: No masses              IA Septum: Normal IAS  MAIN PA                   Size: Normal  PULMONIC VALVE             Morphology: Normal                        Mobility: Fully mobile  RIGHT VENTRICLE              RV Masses: No Masses                         Size:  Normal              Free Wall: Normal                     Contraction: Normal  TRICUSPID VALVE               Leaflets: Normal                        Mobility: Fully mobile             Morphology: Normal  RIGHT ATRIUM                   Size: Normal                        RA Other: None                RA Mass: No masses  PERICARDIUM                 Fluid: No effusion  INFERIOR VENACAVA                   Size: Normal Normal respiratory collapse  _________________________________________________________________________________________   DOPPLER ECHO and OTHER SPECIAL PROCEDURES                 Aortic: MILD AR                    SEVERE AS                         249.0 cm/sec peak vel      24.8 mmHg peak grad                         14.0 mmHg mean grad        0.80 cm^2 by DOPPLER                 Mitral: MILD MR  No MS                         MV Inflow E Vel = nm*      MV Annulus E'Vel = nm*                         E/E'Ratio = nm*              Tricuspid: MILD TR                    No TS                         313.0 cm/sec peak TR vel   54.2 mmHg peak RV pressure              Pulmonary: MILD PR                    No PS  _________________________________________________________________________________________  INTERPRETATION  SEVERE LV SYSTOLIC DYSFUNCTION (See above)  NORMAL RIGHT VENTRICULAR SYSTOLIC FUNCTION  MILD VALVULAR REGURGITATION (See above)  SEVERE VALVULAR STENOSIS (See above)  Poor sound wave transmission significantly limited this study - pt with significant AS  and MS  _________________________________________________________________________________________  Electronically signed by      Rusty Aus, MD on 09/09/2022 08: 55 PM           Performed By: Scherrie November, RCS     Ordering Physician: Ramonita Lab   TELEMETRY reviewed by me (LT) 10/29/2022 : AF 80s-90s  EKG reviewed by me: Ectopic atrial rhythm with rate 98 bpm  Data reviewed by me (LT)  10/29/2022: hospitalist progress note, nursing notes, cbc, bmp, bnp, cxr I/O  Principal Problem:   Cellulitis in diabetic foot ([place]) Active Problems:   Benign essential hypertension   Stage 3b chronic kidney disease (CKD) ([place])   Type 2 diabetes mellitus with diabetic chronic kidney disease ([place])   Pure hypercholesterolemia   Squamous cell lung cancer, right ([place])   Sick sinus syndrome (HCC)   PAF (paroxysmal atrial fibrillation) (HCC)   Normocytic anemia   Cellulitis   HFrEF (heart failure with reduced ejection fraction) (HCC)   Peripheral edema    ASSESSMENT AND PLAN:  Carlos Levine. Carlos Levine is an 48yoM with a PMH of HFrEF (20%, global hypo), ?low flow AS (peak grad 24.91mmHg, 2.75m/s peak vel), paroxysmal AF on dose reduced eliquis per pt preference, SSS s/p [employer] Micra leadless PPM, CKD 3, DM2, PAD s/p L common and ext iliac stents, hx prostate cancer, hx lung cancer s/p XRT who presented to [facility] ED 10/22/22 with worsening left foot swelling and redness at the request of the heart failure clinic.  He was recently admitted from 12/13 - 12/22 for decompensated HFrEF c/b orthostasis and cellulitis of bilateral LE. Cardiology was consulted for assistance with his heart failure. He was diuresed with IV lasix and clinically improved. Echo this admission showed improved EF 35-40%, global hypo, severe 4 chamber dilation (valve eval limited). He underwent LE angiography on 1/5 which revealed extensive calcific plaques of bilateral common femoral arteries. Vascular is recommending open endarterectomy + stenting.   # acute encephalopathy  Lethargic and minimally responsive the AM of 1/9, rapid response called. ABG with hypercarbia, CXR with increased pulm edema, head CT neg for acute finding. Diuretics were held for a day following initial clinical improvement and worsening Cr, possibly  contributory to hypercarbia.  -wean bipap / O2 as tolerated, will continue diuresis below  # acute on chronic HFrEF  (35-40%, prev<20%, global hypo) # dilated CM  # ?low flow AS Presents for his second admission in 1 month with worsening peripheral edema c/b LE cellulitis and nonhealing ulcers. Echo 08/2022 at his PCP's office showed an EF <20%, with mild-mod AS, possibly low flow/gradient with poor LV output. Repeat this admission showed higher EF, global hypo, and assessment of valves technically limited.  BNP 1400 on admission, clinically improved following IV diuresis and transitioned to PO lasix. Diuretics were held yesterday d/t increasing Cr. Cxr 1/9 following rapid response with worsening interstitial edema, repeat BNP 1300.  - s/p IV lasix 40mg  x 1, 20mg  IV x 3 with initial clinical improvement.  - s/p IV lasix 60 x1 on 1/9, continue 40mg  IV BID for now  - GDMT initiation/titration limited by hypotension. Was given a dose of metoprolol XL 12.5mg  once daily on 1/3, but BP too low for more - salt and fluid restriction, strict I/O, daily weights   # LE cellulitis # severe PAD s/p left common and ext iliac stents - on abx per primary team -vascular planning for open endarterectomy + stenting. Following rapid response the AM of 1/9 requiring BIPAP, appropriateness for vascular surgery / [place] will need to be reassessed. Would recommend diuresis today and tomorrow AM and better compensation from a HF standpoint prior to proceeding with vascular procedure.   # paroxysmal AF RVR # SSS s/p Micra PPM  In NSR with short runs of AF on tele, rate controlled in the 80s-90s.  -unable to tolerate metop -eliquis switched to heparin on 1/7 in anticipation of vascular procedure. On 2.5mg  BID at home, per the patient's preference although he does not meet criteria for this dosing, will need to revisit this   # AKI on CKD 3  Baseline from discharge on 12/22 44/1.16, GFR >60. On admission Cr. 1.44 and GFR 48. With Cr trend 1.69 --1.68 -- 1.60--1.65, current GFR 41  This patient's plan of care was discussed and created with  Dr. Clayborn Bigness and he is in agreement.  Signed: Tristan Schroeder , PA-C 10/29/2022, 8:25 AM [facility] Cardiology

## 2022-10-29 NOTE — Progress Notes (Signed)
[place] [facility] [place] NAME: Carlos Levine    MR#:  [id number]  DATE OF BIRTH:  1941/09/17  SUBJECTIVE:  No family at bedside patient still confused. His awake he is on nasal cannula oxygen. No respiratory distress. No fever. Currently getting IV heparin drip. Received some IV Lasix and good urine output yesterday. Not sure about patient's baseline mentation   VITALS:  Blood pressure 108/84, pulse 80, temperature 97.7 F (36.5 C), temperature source Oral, resp. rate 10, height 5\' 8"  (1.727 m), weight 93.7 kg, SpO2 97 %.  PHYSICAL EXAMINATION:   GENERAL:  82 y.o.-year-old patient lying in the bed with no acute distress. Confused on [place] oxygen LUNGS: decreased breath sounds bilaterally, no wheezing CARDIOVASCULAR: S1, S2 normal. No murmur   ABDOMEN: Soft, nontender, nondistended. Bowel sounds present.  EXTREMITIES: No  edema b/l.    NEUROLOGIC: nonfocal  patient is awake but confused   LABORATORY PANEL:  CBC Recent Labs  Lab 10/29/22 0039  WBC 8.1  HGB 12.7*  HCT 42.3  PLT 145*    Chemistries  Recent Labs  Lab 10/23/22 0124 10/24/22 0432 10/25/22 0435 10/26/22 0428 10/29/22 0039  NA 139   < > 138   < > 144  K 4.6   < > 4.7   < > 4.5  CL 98   < > 98   < > 106  CO2 31   < > 26   < > 28  GLUCOSE 131*   < > 236*   < > 90  BUN 72*   < > 79*   < > 84*  CREATININE 1.44*   < > 1.69*   < > 1.65*  CALCIUM 8.4*   < > 8.5*   < > 8.3*  MG  --    < > 2.6*  --   --   AST 34  --   --   --   --   ALT 31  --   --   --   --   ALKPHOS 54  --   --   --   --   BILITOT 1.0  --   --   --   --    < > = values in this interval not displayed.   Cardiac Enzymes No results for input(s): "TROPONINI" in the last 168 hours. RADIOLOGY:  DG Chest Port 1 View  Result Date: 10/28/2022 CLINICAL DATA:  Encephalopathy EXAM: PORTABLE CHEST 1 VIEW COMPARISON:  10/22/2022, 10/01/2022 FINDINGS: Loop recorder. Stable cardiomegaly. Aortic atherosclerosis.  Chronic post treatment changes within the right hemithorax with volume loss and fibrosis. Moderate right pleural effusion, similar to prior. Slightly increasing interstitial markings within the left lung. No left-sided effusion. No pneumothorax. IMPRESSION: 1. Slightly increasing interstitial markings within the left lung, which may reflect edema or atypical/viral infection. 2. Otherwise, no significant interval change. Electronically Signed   By: Davina Poke D.O.   On: 10/28/2022 10:59   CT HEAD WO CONTRAST (5MM)  Result Date: 10/28/2022 CLINICAL DATA:  Encephalopathy EXAM: CT HEAD WITHOUT CONTRAST TECHNIQUE: Contiguous axial images were obtained from the base of the skull through the vertex without intravenous contrast. RADIATION DOSE REDUCTION: This exam was performed according to the departmental dose-optimization program which includes automated exposure control, adjustment of the mA and/or kV according to patient size and/or use of iterative reconstruction technique. COMPARISON:  06/11/2017, 04/07/2019 FINDINGS: Brain: No evidence of acute infarction, hemorrhage, hydrocephalus, extra-axial collection or  mass lesion/mass effect. Moderate low-density changes within the periventricular and subcortical white matter compatible with chronic microvascular ischemic change. Moderate diffuse cerebral volume loss. Vascular: Atherosclerotic calcifications involving the large vessels of the skull base. No unexpected hyperdense vessel. Skull: Normal. Negative for fracture or focal lesion. Sinuses/Orbits: No acute finding. Other: None. IMPRESSION: 1. No acute intracranial findings. 2. Chronic microvascular ischemic change and cerebral volume loss. Electronically Signed   By: Davina Poke D.O.   On: 10/28/2022 10:53    Assessment and Plan Carlos Levine is a 82 y.o. male with medical history significant of hypertension, hyperlipidemia, Dm2 with CKD stage 3, sick sinus syndrome, chronic CHFrEF, Pafib, Elevated  troponin, hx of prostate cancer w recent admission for cellulitis apparently presents due to worsening foot swelling and redness.  Pt was told by cardiologist to come to ED.     Acute metabolic encephalopathy suspected due to hypercarbic respiratory failure with acidosis ?cognitive decline -- patient was placed on BiPAP yesterday currently on 2 L nasal cannula oxygen. Mentation is still waxing -- PRN nebs -- IV Lasix BID good diuresis. Currently new to monitor creatinine.  Diabetic foot infection severe peripheral arterial disease chronic lower extremity edema -- continue IV cefepime (known to cause mental status changes)-- will change to IV Levaquin if no improvement by tomorrow --will d/w ID  RPH  Severe PAD -- vascular surgery consultation appreciated -- angiograms show severe bilateral lower extremity disease -- vascular recommending bilateral femoral endarterectomy with standing of SFA and above knee popliteal artery. -- Lengthy surgery will require patient's cardiac and pulmonary status be stable -- continue IV heparin drip  Severe cardiomyopathy -- EF currently 35 to 40% with more related use Re: function -- continue IV Lasix, monitor input output and metabolic panel -- [facility] cardiology input appreciated --hold Home farxiga   A-fib, paroxysmal HR currently controlled - holding apixaban, started heparin 1/7, in anticipation of vascular surgery    CAD No chest pain - cont home statin   T2DM Glucose mild elevation - continue SSI, semglee 5 qhs   Peripheral Neuropathy - cont home pregabalin   CKD 3b Kidney function at baseline though cr rising a bit - will proceed w/ lasix given above processes   H/o Sick sinus Has pacemaker   h/o  Lung cancer In remission, surveilled by oncology  Nutrition Status: Nutrition Problem: Moderate Malnutrition Etiology: chronic illness (COPD, CHF, prostate cancer, lung cancer) Signs/Symptoms: moderate fat depletion, severe fat  depletion, moderate muscle depletion, severe muscle depletion   consider palliative care consultation if needed  Procedures: Family communication :wife on the phone Consults :cardiology, Vascular CODE STATUS: DNR DVT Prophylaxis :heparin gtt Level of care: Progressive Status is: Inpatient Remains inpatient appropriate because: AMS,Resp failure, severe PAD    TOTAL TIME TAKING CARE OF THIS PATIENT: 35 minutes.  >50% time spent on counselling and coordination of care  Note: This dictation was prepared with Dragon dictation along with smaller phrase technology. Any transcriptional errors that result from this process are unintentional.  Fritzi Mandes M.D    [facility]   CC: Primary care physician; [name], [place] at [facility]

## 2022-10-29 NOTE — Progress Notes (Signed)
PT Cancellation Note  Patient Details Name: Carlos Levine MRN: [id number] DOB: 1941/02/12   Cancelled Treatment:    Reason Eval/Treat Not Completed: Medical issues which prohibited therapy Patient transferred to higher level of care and currently on BiPAP with minimal responsiveness. Will hold and await updated activity orders.   [name], [name] [name]    [name] 10/29/2022, 8:46 AM

## 2022-10-29 NOTE — Progress Notes (Signed)
Nutrition Follow-up  DOCUMENTATION CODES:   Non-severe (moderate) malnutrition in context of chronic illness  INTERVENTION:   -RD will follow for diet advancement and add supplements as appropriate -If pt desires aggressive care and is within goals of care, consider initiation of nutrition support:   Initiate Osmolite 1.5 @ 20 ml/hr and increase by 10 ml every 12 hours to goal rate of 50 ml/hr.   60 ml Prostat daily  165 ml free water flush very 4 hours  Tube feeding regimen provides 1880 kcal (100% of needs), 95 grams of protein, and 914 ml of H2O.  Total free water: 1904 ml daily  NUTRITION DIAGNOSIS:   Moderate Malnutrition related to chronic illness (COPD, CHF, prostate cancer, lung cancer) as evidenced by moderate fat depletion, severe fat depletion, moderate muscle depletion, severe muscle depletion.  Ongoing  GOAL:   Patient will meet greater than or equal to 90% of their needs  Unmet  MONITOR:   PO intake, Supplement acceptance, Labs, Weight trends, Skin, I & O's  REASON FOR ASSESSMENT:   Consult Assessment of nutrition requirement/status  ASSESSMENT:   82 y/o male with h/o CHF, COPD, DM, HLD, HTN, CAD, pacemaker, prostate cancer, SCLC s/p XRT, CKD III and blisters on bilateral feet who is admitted with foot infection and cellulitis.  1/5- s/p aortogram  Reviewed I/O's: -1.7 L x 24 hours and -959 ml since admission  UOP: 2.1 L x 24 hours   Pt has been minimally responsive and on bi-pap since 10/28/22. Pt on bi-pap and time of visit and did not respond to name being called.   Per cardiology notes, pt is medically optimized for procedure, however, needs to participate in goals of care discussions if not better tomorrow to determine how medical course will proceed.  Prior to acute event, pt with good appetite, consuming 25-100% of meals. Pt currently NPO due to lethargy and bi-pap. If aggressive measures are desired, may need to consider NGT placement for  nutrition.  Medications reviewed and include lasix, miralax, senokot, and dextrose 10% infusion @ 10 ml/hr.   Labs reviewed: CBGS: 84-126 (inpatient orders for glycemic control are 0-15 units insulin aspart TID with meals, 0-5 units insulin aspart daily at bedtime, and 5 units insulin glargine-yfgn daily).    Diet Order:   Diet Order             Diet heart healthy/carb modified Room service appropriate? Yes; Fluid consistency: Thin  Diet effective now                   EDUCATION NEEDS:   Not appropriate for education at this time  Skin:  Skin Assessment: Reviewed RN Assessment (R foot blister, L foot wound)  Last BM:  10/24/22  Height:   Ht Readings from Last 1 Encounters:  10/22/22 5\' 8"  (1.727 m)    Weight:   Wt Readings from Last 1 Encounters:  10/28/22 93.7 kg    Ideal Body Weight:  70 kg  BMI:  Body mass index is 31.41 kg/m.  Estimated Nutritional Needs:   Kcal:  1900-2200kcal/day  Protein:  95-110g/day  Fluid:  1.8-2.1L/day    Loistine Chance, RD, LDN, [place] Registered Dietitian II Certified Diabetes Care and Education Specialist Please refer to AMION for RD and/or RD on-call/weekend/after hours pager

## 2022-10-29 NOTE — Progress Notes (Signed)
  Progress Note    10/29/2022 10:50 AM 5 Days Post-Op  Subjective:  Mr ISAIS KLIPFEL    Vitals:   10/29/22 0700 10/29/22 0750  BP:  125/64  Pulse: 90 95  Resp: (!) 27 15  Temp:  (!) 96.7 F (35.9 C)  SpO2:  96%   Physical Exam: Cardiac:  *** Lungs:  *** Incisions:  *** Extremities:  *** Abdomen:  *** Neurologic: ***  CBC    Component Value Date/Time   WBC 8.1 10/29/2022 0039   RBC 4.14 (L) 10/29/2022 0039   HGB 12.7 (L) 10/29/2022 0039   HCT 42.3 10/29/2022 0039   PLT 145 (L) 10/29/2022 0039   MCV 102.2 (H) 10/29/2022 0039   MCH 30.7 10/29/2022 0039   MCHC 30.0 10/29/2022 0039   RDW 16.9 (H) 10/29/2022 0039   LYMPHSABS 0.8 10/24/2022 0432   MONOABS 0.8 10/24/2022 0432   EOSABS 0.0 10/24/2022 0432   BASOSABS 0.1 10/24/2022 0432    BMET    Component Value Date/Time   NA 144 10/29/2022 0039   NA 139 11/30/2012 0914   K 4.5 10/29/2022 0039   K 5.2 (H) 11/30/2012 0914   CL 106 10/29/2022 0039   CL 107 11/30/2012 0914   CO2 28 10/29/2022 0039   CO2 24 11/30/2012 0914   GLUCOSE 90 10/29/2022 0039   GLUCOSE 197 (H) 11/30/2012 0914   BUN 84 (H) 10/29/2022 0039   BUN 20 (H) 11/30/2012 0914   CREATININE 1.65 (H) 10/29/2022 0039   CREATININE 1.31 (H) 11/30/2012 0914   CALCIUM 8.3 (L) 10/29/2022 0039   CALCIUM 9.1 11/30/2012 0914   GFRNONAA 41 (L) 10/29/2022 0039   GFRNONAA 54 (L) 11/30/2012 0914   GFRAA 46 (L) 11/28/2019 0934   GFRAA >60 11/30/2012 0914    INR    Component Value Date/Time   INR 1.6 (H) 10/26/2022 0913     Intake/Output Summary (Last 24 hours) at 10/29/2022 1050 Last data filed at 10/29/2022 0616 Gross per 24 hour  Intake 385.91 ml  Output 2100 ml  Net -1714.09 ml     Assessment/Plan:  82 y.o. male is s/p *** 5 Days Post-Op  BiPAP at 12 PIP DVT prophylaxis:  ***   Arelie Kuzel R Ruslan Mccabe [facility] 10/29/2022 10:50 AM

## 2022-10-29 NOTE — Progress Notes (Signed)
Inpatient Diabetes Program Recommendations  AACE/ADA: New Consensus Statement on Inpatient Glycemic Control (2015)  Target Ranges:  Prepandial:   less than 140 mg/dL      Peak postprandial:   less than 180 mg/dL (1-2 hours)      Critically ill patients:  140 - 180 mg/dL   Lab Results  Component Value Date   GLUCAP 96 10/29/2022   HGBA1C 7.1 (H) 10/02/2022    Review of Glycemic Control  Latest Reference Range & Units 10/29/22 02:44 10/29/22 06:11 10/29/22 07:16 10/29/22 07:48  Glucose-Capillary 70 - 99 mg/dL 82 68 (L) 84 96   Diabetes history: DM Outpatient Diabetes medications:  Farxiga 10 mg daily Current orders for Inpatient glycemic control:  Novolog moderate tid with meals and HS D/10 50 ml/hr  Inpatient Diabetes Program Recommendations:    Note low blood sugars. Consider reducing Novolog to "very sensitive" 0-6 units tid with meals.   Thanks,  Adah Perl, RN, BC-ADM Inpatient Diabetes Coordinator Pager [phone]  (8a-5p)

## 2022-10-29 NOTE — Progress Notes (Addendum)
       CROSS COVER NOTE  NAME: KAIYON HYNES MRN: [id number] DOB : [birth year] ATTENDING PHYSICIAN: Gwynne Edinger, MD    Date of Service   10/29/2022   HPI/Events of Note   Message received from RN requesting follow up blood gas be ordered on patient that transferred to progressive shortly after shift change for Bipap.  0640: Mr Wiseman has had 2 hypoglycemic events overnight while NPO. D10 started.  Interventions   Assessment/Plan:  VBG- pH 7.27 pCO2 77 pO2 39 Bicarbonate 35.4 O2 Sat 49.4% Increase IPAP 0600AM Repeat VBG - pH 7.39 pCO2 50 pO2 58 Bicarbonate 30.3 O2 Sat 88.7%      To reach the provider On-Call:   7AM- 7PM see care teams to locate the attending and reach out to them via [email or web address]. 7PM-7AM contact night-coverage If you still have difficulty reaching the appropriate provider, please page the [facility] (Director on Call) for [facility] on amion for assistance  This document was prepared using Set designer software and may include unintentional dictation errors.  Neomia Glass DNP, MBA, FNP-BC Nurse Practitioner [facility] Pager [phone]

## 2022-10-29 NOTE — Progress Notes (Signed)
[place] for heparin infusion initiation and monitoring Indication: atrial fibrillation  Allergies  Allergen Reactions   Contrast Media [Iodinated Contrast Media] Hives and Rash   Ace Inhibitors Other (See Comments)    Unknown reaction type   Latex Rash    Patient Measurements: Height: 5\' 8"  (172.7 cm) Weight: 93.7 kg (206 lb 9.1 oz) IBW/kg (Calculated) : 68.4 Heparin Dosing Weight: 87.3 kg  Vital Signs: Temp: 97.7 F (36.5 C) (01/10 0447) Temp Source: Oral (01/09 2324) BP: 106/64 (01/10 0447) Pulse Rate: 88 (01/10 0447)  Labs: Recent Labs    10/26/22 0913 10/26/22 1813 10/27/22 0355 10/27/22 1436 10/27/22 2239 10/28/22 0359 10/28/22 1122 10/29/22 0039  HGB  --   --   --   --   --  13.2  --  12.7*  HCT  --   --   --   --   --  43.3  --  42.3  PLT  --   --   --   --   --  124*  --  145*  APTT 41*   < > 145*   < > 81* 74*  --  80*  LABPROT 19.3*  --   --   --   --   --   --   --   INR 1.6*  --   --   --   --   --   --   --   HEPARINUNFRC  --   --  >1.10*  --   --  0.64  --  0.55  CREATININE  --   --  1.60*  --   --  1.66*  --  1.65*  TROPONINIHS  --   --   --   --   --   --  86*  --    < > = values in this interval not displayed.     Estimated Creatinine Clearance: 37.7 mL/min (A) (by C-G formula based on SCr of 1.65 mg/dL (H)).   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   also has history of skin cancer    Medications:  Scheduled:   vitamin C  500 mg Oral BID   feeding supplement  237 mL Oral TID BM   furosemide  40 mg Intravenous BID   insulin aspart  0-15 Units Subcutaneous TID WC   insulin aspart  0-5 Units Subcutaneous QHS   insulin glargine-yfgn  5 Units Subcutaneous QHS   multivitamin  1 tablet Oral Daily   polyethylene glycol  17 g Oral Daily   pravastatin  80 mg Oral QHS   pregabalin  50 mg Oral BID   senna  1 tablet Oral Daily   sodium chloride flush  3 mL  Intravenous Q12H    Assessment: 82 y/o male with h/o CHF, COPD, DM, HLD, HTN, CAD, pacemaker, prostate cancer, SCLC s/p XRT, CKD III and blisters on bilateral feet who is admitted with foot infection and cellulitis. Patient is being transitioned to heparin from Eliquis after speaking with primary service to facilitate option of surgery   apixaban last dose 10/25/22 2157  Goal of Therapy:  aPTT 66 - 102  seconds Monitor platelets by anticoagulation protocol: Yes   1/7 @ 1813: aPTT >130 sec, supratherapeutic @ 1300 u/hr 1/8 @ 0355: aPTT= 145 sec, HL = > 1.10 1/8 @ 1436: aPTT= 81 sec 1/8 2239 aPTT = 81 1/9 0359 aPTT 74, therapeutic x 3, HL 0.64 1/10 0039 aPTT 80, therapeutic x 4, HL 0.55  Plan:  Therapeutic x 4 - Continue current heparin infusion at 750 units/hr.  - Recheck HL w/ AM labs to confirm correlation - aPTT daily with AM labs while therapeutic - Continue to monitor H&H and platelets  Renda Rolls, PharmD, MBA 10/29/2022 5:00 AM

## 2022-10-30 DIAGNOSIS — E11628 Type 2 diabetes mellitus with other skin complications: Secondary | ICD-10-CM | POA: Diagnosis not present

## 2022-10-30 DIAGNOSIS — L03119 Cellulitis of unspecified part of limb: Secondary | ICD-10-CM | POA: Diagnosis not present

## 2022-10-30 LAB — BASIC METABOLIC PANEL
Anion gap: 13 (ref 5–15)
BUN: 82 mg/dL — ABNORMAL HIGH (ref 8–23)
CO2: 27 mmol/L (ref 22–32)
Calcium: 8.3 mg/dL — ABNORMAL LOW (ref 8.9–10.3)
Chloride: 106 mmol/L (ref 98–111)
Creatinine, Ser: 1.67 mg/dL — ABNORMAL HIGH (ref 0.61–1.24)
GFR, Estimated: 40 mL/min — ABNORMAL LOW (ref >60)
Glucose, Bld: 151 mg/dL — ABNORMAL HIGH (ref 70–99)
Potassium: 4 mmol/L (ref 3.5–5.1)
Sodium: 146 mmol/L — ABNORMAL HIGH (ref 135–145)

## 2022-10-30 LAB — GLUCOSE, CAPILLARY
Glucose-Capillary: 168 mg/dL — ABNORMAL HIGH (ref 70–99)
Glucose-Capillary: 168 mg/dL — ABNORMAL HIGH (ref 70–99)
Glucose-Capillary: 119 mg/dL — ABNORMAL HIGH (ref 70–99)
Glucose-Capillary: 107 mg/dL — ABNORMAL HIGH (ref 70–99)
Glucose-Capillary: 125 mg/dL — ABNORMAL HIGH (ref 70–99)
Glucose-Capillary: 97 mg/dL (ref 70–99)
Glucose-Capillary: 81 mg/dL (ref 70–99)

## 2022-10-30 LAB — CBC
HCT: 38.3 % — ABNORMAL LOW (ref 39.0–52.0)
Hemoglobin: 11.9 g/dL — ABNORMAL LOW (ref 13.0–17.0)
MCH: 30.7 pg (ref 26.0–34.0)
MCHC: 31.1 g/dL (ref 30.0–36.0)
MCV: 98.7 fL (ref 80.0–100.0)
Platelets: 144 10*3/uL — ABNORMAL LOW (ref 150–400)
RBC: 3.88 MIL/uL — ABNORMAL LOW (ref 4.22–5.81)
RDW: 16.8 % — ABNORMAL HIGH (ref 11.5–15.5)
WBC: 7.5 10*3/uL (ref 4.0–10.5)
nRBC: 0 % (ref 0.0–0.2)

## 2022-10-30 LAB — HEPARIN LEVEL (UNFRACTIONATED): Heparin Unfractionated: 0.38 IU/mL (ref 0.30–0.70)

## 2022-10-30 LAB — MAGNESIUM: Magnesium: 2.4 mg/dL (ref 1.7–2.4)

## 2022-10-30 LAB — APTT: aPTT: 66 seconds — ABNORMAL HIGH (ref 24–36)

## 2022-10-30 MED ORDER — OXYCODONE HCL 5 MG PO TABS: 5.0000 mg | ORAL_TABLET | Freq: Three times a day (TID) | ORAL | Status: DC | PRN

## 2022-10-30 NOTE — Progress Notes (Signed)
I spoke with the patient's wife and daughter.  Patient is clearly confused and was not participating in the conversation although he is alert.  From a vascular standpoint his overall condition prohibits surgical intervention at this time.  In fact I am not even certain that he would tolerate bilateral amputations.  Given this current situation I believe that local wound care is indicated understanding that his wounds are unlikely to heal.  The family is quite certain that if he gets to go home he will improved dramatically.  They describe that the last time he was discharged from the hospital once he was home he became much stronger and was actually going up and down stairs.  I told them should he make a significant improvement and gain back strength and have a improvement in the pulmonary status that we could always revisit the idea of femoral endarterectomy for revascularization and subsequently expect that his wounds would heal.  Unfortunately, at this point in time I do not believe that that is a reasonable course of action.  Again, I recommend local wound care understanding that this is a stopgap and that his wounds are not likely to heal.

## 2022-10-30 NOTE — Progress Notes (Signed)
Nutrition Follow-up  DOCUMENTATION CODES:   Non-severe (moderate) malnutrition in context of chronic illness  INTERVENTION:   -RD will follow for diet advancement and add supplements as appropriate -If pt desires aggressive care and is within goals of care, consider initiation of nutrition support:    Initiate Osmolite 1.5 @ 20 ml/hr and increase by 10 ml every 12 hours to goal rate of 50 ml/hr.    60 ml Prostat daily   165 ml free water flush every 4 hours   Tube feeding regimen provides 1880 kcal (100% of needs), 95 grams of protein, and 914 ml of H2O.  Total free water: 1904 ml daily  NUTRITION DIAGNOSIS:   Moderate Malnutrition related to chronic illness (COPD, CHF, prostate cancer, lung cancer) as evidenced by moderate fat depletion, severe fat depletion, moderate muscle depletion, severe muscle depletion.  Ongoing  GOAL:   Patient will meet greater than or equal to 90% of their needs  Progressing   MONITOR:   PO intake, Supplement acceptance, Labs, Weight trends, Skin, I & O's  REASON FOR ASSESSMENT:   Consult Assessment of nutrition requirement/status  ASSESSMENT:   82 y/o male with h/o CHF, COPD, DM, HLD, HTN, CAD, pacemaker, prostate cancer, SCLC s/p XRT, CKD III and blisters on bilateral feet who is admitted with foot infection and cellulitis.  1/10- advanced to heart healthy, carb modified diet  Reviewed I/O's: -1.7 L x 24 hours and -2.7 L since admission  UOP: 2.7 L x 24 hours   Pt lying in bed at time of visit. He did not respond to voice.   Pt just advanced to a heart healthy, carb modified diet. Noted meal completions 10-25%. Per RN, pt having difficulty swallowing pills and is awaiting swallow evaluation. Palliative care consult also pending.   Medications reviewed and include vitamin C and miralax.   Labs reviewed: Na: 146, Phos: 6.4, CBGS: 107-125 (inpatient orders for glycemic control are 0-5 units insulin aspart daily at bedtime, 0-15 units  insulin aspart TID with meals, 5 units inuslin glargine-yfgn daily).    Diet Order:   Diet Order             Diet heart healthy/carb modified Room service appropriate? Yes; Fluid consistency: Thin  Diet effective now                   EDUCATION NEEDS:   Not appropriate for education at this time  Skin:  Skin Assessment: Reviewed RN Assessment (R foot blister, L foot wound)  Last BM:  10/24/22  Height:   Ht Readings from Last 1 Encounters:  10/22/22 5\' 8"  (1.727 m)    Weight:   Wt Readings from Last 1 Encounters:  10/28/22 93.7 kg    Ideal Body Weight:  70 kg  BMI:  Body mass index is 31.41 kg/m.  Estimated Nutritional Needs:   Kcal:  1900-2200kcal/day  Protein:  95-110g/day  Fluid:  1.8-2.1L/day    Loistine Chance, RD, LDN, [place] Registered Dietitian II Certified Diabetes Care and Education Specialist Please refer to [facility] for RD and/or RD on-call/weekend/after hours pager

## 2022-10-30 NOTE — Progress Notes (Signed)
Patient not alert enough righ now to take PO meds or eat breakfast.

## 2022-10-30 NOTE — Progress Notes (Signed)
[place] at [place] NAME: Carlos Levine    MR#:  [id number]  DATE OF BIRTH:  [birth year]  SUBJECTIVE:  No family at bedside patient still confused.he is on nasal cannula oxygen. No respiratory distress. No fever. Currently getting IV heparin drip. Received some IV Lasix and good urine output yesterday. Per wife pt has been having issues with mentation at home for several months Trouble swallowing pills per RN   VITALS:  Blood pressure 110/71, pulse (!) 101, temperature 98 F (36.7 C), resp. rate 12, height 5\' 8"  (1.727 m), weight 93.7 kg, SpO2 100 %.  PHYSICAL EXAMINATION:   GENERAL:  82 y.o.-year-old patient lying in the bed with no acute distress. Confused on Carlos Levine oxygen LUNGS: decreased breath sounds bilaterally, no wheezing CARDIOVASCULAR: S1, S2 normal. No murmur   ABDOMEN: Soft, nontender, nondistended. EXTREMITIES:   NEUROLOGIC: unable to assess   LABORATORY PANEL:  CBC Recent Labs  Lab 10/30/22 0313  WBC 7.5  HGB 11.9*  HCT 38.3*  PLT 144*     Chemistries  Recent Labs  Lab 10/30/22 0313  NA 146*  K 4.0  CL 106  CO2 27  GLUCOSE 151*  BUN 82*  CREATININE 1.67*  CALCIUM 8.3*  MG 2.4    Cardiac Enzymes No results for input(s): "TROPONINI" in the last 168 hours. RADIOLOGY:  No results found.  Assessment and Plan Carlos Levine is a 82 y.o. male with medical history significant of hypertension, hyperlipidemia, Dm2 with CKD stage 3, sick sinus syndrome, chronic CHFrEF, Pafib, Elevated troponin, hx of prostate cancer w recent admission for cellulitis apparently presents due to worsening foot swelling and redness.  Pt was told by cardiologist to come to ED.     Acute metabolic encephalopathy suspected due to hypercarbic respiratory failure with acidosis ?cognitive decline -- patient was placed on BiPAP yesterday currently on 2 L nasal cannula oxygen. Mentation is still waxing -- PRN nebs -- IV Lasix BID good  diuresis. Currently new to monitor creatinine. --hold further lasix --d/c Lyrica/sedating meds  Diabetic foot infection severe peripheral arterial disease chronic lower extremity edema -- continue IV cefepime (known to cause mental status changes)-- d/w ID  RPH--completed 7 days of IV abxs --d/c for now and monitor --wbc normal, no fever  Severe PAD -- vascular surgery consultation appreciated -- angiograms show severe bilateral lower extremity disease -- vascular recommending bilateral femoral endarterectomy with standing of SFA and above knee popliteal artery. -- Lengthy surgery will require patient's cardiac and pulmonary status be stable -- continue IV heparin drip --spoke with pt's wife Carlos Levine on the phone--she understands overall a poor prognosis. Will await d/w Carlos Levine Delana Meyer later today.  Severe cardiomyopathy -- EF currently 35 to 40% with global hypokinesis -- received few IV Lasix, monitor input output and metabolic panel --Clinically appears a bit dry--Holding lasix today -- [facility] cardiology input appreciated --hold Home farxiga   A-fib, paroxysmal HR currently controlled - holding apixaban, started heparin 1/7, in anticipation of possible vascular surgery    CAD - cont home statin   T2DM Glucose mild elevation - continue SSI   Peripheral Neuropathy - cont home pregabalin   CKD 3b Kidney function at baseline though cr rising a bit - will proceed w/ lasix given above processes   H/o Sick sinus Has pacemaker   h/o  Lung cancer In remission, surveilled by oncology  Nutrition Status: Nutrition Problem: Moderate Malnutrition Etiology: chronic illness (COPD, CHF, prostate cancer, lung  cancer) Signs/Symptoms: moderate fat depletion, severe fat depletion, moderate muscle depletion, severe muscle depletion  ST to see due to poor inability to swallow   consider palliative care consultation.  Procedures: angiogram LE Family communication :wife on the phone  1/11 Consults :cardiology, Vascular CODE STATUS: DNR DVT Prophylaxis :heparin gtt Level of care: Progressive Status is: Inpatient Remains inpatient appropriate because: AMS,Resp failure, severe PAD    TOTAL TIME TAKING CARE OF THIS PATIENT: 35 minutes.  >50% time spent on counselling and coordination of care  Note: This dictation was prepared with Dragon dictation along with smaller phrase technology. Any transcriptional errors that result from this process are unintentional.  Carlos Levine M.D    [facility]   CC: Primary care physician; Carlos Levine, [place] at [facility]

## 2022-10-30 NOTE — Progress Notes (Signed)
Physical Therapy Re-Evaluation Patient Details Name: Carlos Levine MRN: [id number] DOB: [birth year] Today's Date: 10/30/2022  History of Present Illness  Carlos Levine is a 82 y.o. male with medical history significant of hypertension, hyperlipidemia, Dm2 with CKD stage 3, sick sinus syndrome, chronic CHFrEF, Pafib, Elevated troponin, hx of prostate cancer w recent admission for cellulitis apparently presents due to worsening foot swelling and redness.  Pt was told by cardiologist to come to ED.  Note that patient declined rehab during recent admission and went home.  Note prior ultrasound 10/01/2022 during recent admission negative for DVT. Rapid response called 1/9 for unresponsiveness and transferred to progressive care.  Clinical Impression  Patient seen for re-evaluation. Continues to be lethargic but does open eyes intermittently through session with stimulation. Not following commands throughout session. NT exiting on PT arrival after attempting to feed patient. Patient found with food in both sides of mouth and inability to swallow with coughing noted. Attempted to scoop food out of mouth to prevent aspiration, notified RN of need to suction and swallow screen. TotalA+2 to reposition higher in bed with HOB elevated into semi chair position. Continue to recommend SNF for ongoing Physical Therapy.         Recommendations for follow up therapy are one component of a multi-disciplinary discharge planning process, led by the attending physician.  Recommendations may be updated based on patient status, additional functional criteria and insurance authorization.  Follow Up Recommendations Skilled nursing-short term rehab (<3 hours/day) Can patient physically be transported by private vehicle: No    Assistance Recommended at Discharge Frequent or constant Supervision/Assistance  Patient can return home with the following  Two people to help with walking and/or transfers;Two people to help with  bathing/dressing/bathroom;Assistance with cooking/housework;Assistance with feeding;Direct supervision/assist for medications management;Direct supervision/assist for financial management;Assist for transportation;Help with stairs or ramp for entrance    Equipment Recommendations Hospital bed;Wheelchair cushion (measurements PT);Wheelchair (measurements PT)  Recommendations for Other Services       Functional Status Assessment       Precautions / Restrictions Precautions Precautions: Fall Restrictions Weight Bearing Restrictions: No      Mobility  Bed Mobility Overal bed mobility: Needs Assistance Bed Mobility: Rolling Rolling: Total assist, +2 for physical assistance, +2 for safety/equipment         General bed mobility comments: totalA+2 to reposition in bed and patient not following commands. Asterixis movements noted throughout session    Transfers                        Ambulation/Gait                  Stairs            Wheelchair Mobility    Modified Rankin (Stroke Patients Only)       Balance                                             Pertinent Vitals/Pain Pain Assessment Pain Assessment: PAINAD Breathing: occasional labored breathing, short period of hyperventilation Negative Vocalization: none Facial Expression: smiling or inexpressive Body Language: tense, distressed pacing, fidgeting Consolability: no need to console PAINAD Score: 2    Home Living  Prior Function                       Hand Dominance        Extremity/Trunk Assessment                Communication      Cognition Arousal/Alertness: Lethargic Behavior During Therapy: Flat affect Overall Cognitive Status: Difficult to assess         General Comments: not following commands. Eyes open intermittently but not on command        General Comments General comments (skin  integrity, edema, etc.): NT exiting on arrival after attempting to feed patient. Patient found with food in mouth and inability to swallow. Attempted to scoop food out of mouth to prevent aspiration. RN present and aware    Exercises     Assessment/Plan    PT Assessment Patient needs continued PT services  PT Problem List Decreased strength;Decreased activity tolerance;Decreased balance;Decreased knowledge of use of DME;Decreased mobility;Decreased safety awareness;Decreased skin integrity       PT Treatment Interventions      PT Goals (Current goals can be found in the Care Plan section)  Acute Rehab PT Goals Patient Stated Goal: unable to state PT Goal Formulation: Patient unable to participate in goal setting Time For Goal Achievement: 11/08/22 Potential to Achieve Goals: Poor    Frequency Min 2X/week     Co-evaluation               AM-PAC PT "6 Clicks" Mobility  Outcome Measure Help needed turning from your back to your side while in a flat bed without using bedrails?: Total Help needed moving from lying on your back to sitting on the side of a flat bed without using bedrails?: Total Help needed moving to and from a bed to a chair (including a wheelchair)?: Total Help needed standing up from a chair using your arms (e.g., wheelchair or bedside chair)?: Total Help needed to walk in hospital room?: Total Help needed climbing 3-5 steps with a railing? : Total 6 Click Score: 6    End of Session Equipment Utilized During Treatment: Oxygen Activity Tolerance: Patient limited by lethargy Patient left: in bed;with call bell/phone within reach;with bed alarm set Nurse Communication: Mobility status PT Visit Diagnosis: Difficulty in walking, not elsewhere classified (R26.2);Muscle weakness (generalized) (M62.81);Unsteadiness on feet (R26.81)    Time: 1003-1016 PT Time Calculation (min) (ACUTE ONLY): 13 min   Charges:   PT Evaluation $PT Re-evaluation: 1 Re-eval           Tereso Unangst A. Gilford Rile PT, [name] [facility] - Acute Rehabilitation Services   Caryl Manas A Tiahna Cure 10/30/2022, 10:26 AM

## 2022-10-30 NOTE — TOC Progression Note (Signed)
Transition of Care [facility]) - Progression Note    Patient Details  Name: Carlos Levine MRN: [id number] Date of Birth: 05/25/1941  Transition of Care [facility]) CM/SW [place], [place] Phone Number: 10/30/2022, 9:29 AM  Clinical Narrative:      CSW notes pending vascular procedure once more medically stable, then will need PT to reassess patient's dispo recommendations- current recs for snf but patient/family have hx of declining snf. TOC will continue to follow.       Expected Discharge Plan and Services                                               Social Determinants of Health (SDOH) Interventions SDOH Screenings   Food Insecurity: No Food Insecurity (10/23/2022)  Housing: Low Risk  (10/23/2022)  Transportation Needs: No Transportation Needs (10/23/2022)  Utilities: Not At Risk (10/23/2022)  Financial Resource Strain: Low Risk  (04/13/2019)  Physical Activity: Unknown (04/13/2019)  Social Connections: Moderately Isolated (04/13/2019)  Stress: Stress Concern Present (04/13/2019)  Tobacco Use: Medium Risk (10/24/2022)    Readmission Risk Interventions    10/24/2022    3:03 PM  Readmission Risk Prevention Plan  Transportation Screening Complete  Medication Review (RN Care Manager) Complete  SW Recovery Care/Counseling Consult Complete  Palliative Care Screening Complete

## 2022-10-30 NOTE — Progress Notes (Addendum)
SLP Cancellation Note  Patient Details Name: Carlos Levine MRN: [id number] DOB: [birth year]   Cancelled treatment:       Reason Eval/Treat Not Completed: Patient not medically ready;Medical issues which prohibited therapy (chart reviewed; consulted NSG and MD re: pt's status)  Upon entering pt's room this PM, pt was constantly shouting out unintelligible words/phrases -- incoherent. Pt did not appear to recognize his name nor this Clinician nor follow a basic command when given MAX verbal/tactile/visual cues -- there was no ceasing in the shouting out.  NSG reported pt has holding his food orally this morning, and it had to be removed/suctioned from his mouth.  In the setting of pt's Cognitive decline and presentation currently, he is not coherent to safely participate in po tasks/intake. Per NSG report, pt has not been meeting his nutrition/hydration needs during this admit -- it was described as being "work to get in a few bites b/f he starting spitting it at me", per NSG. (This is not functional nor safe for oral intake.)  He and Family may benefit from a Palliative Care consult to discuss GOC re: being able to meet nutrition/hydration needs safely. ST services will be available for assessment of swallowing, education. MD and NSG updated, agreed.  Recommend frequent oral care for hygiene and stimulation of swallowing. NPO status recommended; diet order removed in chart. NSG/MD updated.      Jerilynn Som, MS, CCC-SLP Speech Language Pathologist Rehab Services; [facility] [phone] (ascom) Carlos Levine 10/30/2022, 5:00 PM

## 2022-10-30 NOTE — Progress Notes (Signed)
[place] for heparin infusion initiation and monitoring Indication: atrial fibrillation  Allergies  Allergen Reactions   Contrast Media [Iodinated Contrast Media] Hives and Rash   Ace Inhibitors Other (See Comments)    Unknown reaction type   Latex Rash    Patient Measurements: Height: 5\' 8"  (172.7 cm) Weight: 93.7 kg (206 lb 9.1 oz) IBW/kg (Calculated) : 68.4 Heparin Dosing Weight: 87.3 kg  Vital Signs: Temp: 98.3 F (36.8 C) (01/11 0414) Temp Source: Oral (01/11 0414) BP: 111/60 (01/11 0414) Pulse Rate: 101 (01/11 0414)  Labs: Recent Labs    10/28/22 0359 10/28/22 1122 10/29/22 0039 10/30/22 0313  HGB 13.2  --  12.7* 11.9*  HCT 43.3  --  42.3 38.3*  PLT 124*  --  145* 144*  APTT 74*  --  80* 66*  HEPARINUNFRC 0.64  --  0.55 0.38  CREATININE 1.66*  --  1.65* 1.67*  TROPONINIHS  --  86*  --   --      Estimated Creatinine Clearance: 37.2 mL/min (A) (by C-G formula based on SCr of 1.67 mg/dL (H)).   Medical History: Past Medical History:  Diagnosis Date   Arrhythmia    PAF   Arthritis    Cancer ([place])    prostate   CHF (congestive heart failure) (HCC)    Chronic kidney disease    COPD (chronic obstructive pulmonary disease) (HCC)    Coronary artery disease    Diabetes mellitus without complication (HCC)    Dyspnea    with minimal exertion   Dysrhythmia    history of SVT   History of kidney stones    History of radiation therapy    lung   Hyperkalemia 11/19/2017   Hyperlipidemia    Hypertension    Peripheral vascular disease (HCC)    Prostate cancer (HCC)    Squamous cell lung cancer ([place]) 04/2020   also has history of skin cancer    Medications:  Scheduled:   vitamin C  500 mg Oral BID   feeding supplement  237 mL Oral TID BM   furosemide  40 mg Intravenous BID   insulin aspart  0-15 Units Subcutaneous TID WC   insulin aspart  0-5 Units Subcutaneous QHS   insulin glargine-yfgn  5 Units Subcutaneous  QHS   multivitamin  1 tablet Oral Daily   polyethylene glycol  17 g Oral Daily   pravastatin  80 mg Oral QHS   pregabalin  50 mg Oral BID   senna  1 tablet Oral Daily   sodium chloride flush  3 mL Intravenous Q12H    Assessment: 82 y/o male with h/o CHF, COPD, DM, HLD, HTN, CAD, pacemaker, prostate cancer, SCLC s/p XRT, CKD III and blisters on bilateral feet who is admitted with foot infection and cellulitis. Patient is being transitioned to heparin from Eliquis after speaking with primary service to facilitate option of surgery   apixaban last dose 10/25/22 2157  Goal of Therapy:  aPTT 66 - 102  seconds Monitor platelets by anticoagulation protocol: Yes   1/7 @ 1813: aPTT >130 sec, supratherapeutic @ 1300 u/hr 1/8 @ 0355: aPTT= 145 sec, HL = > 1.10 1/8 @ 1436: aPTT= 81 sec 1/8 2239 aPTT = 81 1/9 0359 aPTT 74, therapeutic x 3, HL 0.64 1/10 0039 aPTT 80, therapeutic x 4, HL 0.55 1/11 0313 HL  0.38/aPTT 66, therapeutic x 5  Plan:  Therapeutic x 4 - Continue current heparin infusion at 750 units/hr.  -  Recheck HL w/ AM labs daily while therapeutic - Continue to monitor H&H and platelets  Renda Rolls, PharmD, [facility] 10/30/2022 4:16 AM

## 2022-10-30 NOTE — Progress Notes (Signed)
[facility] NOTE       Patient ID: Carlos Levine MRN: [id number] DOB/AGE: Sep 07, 1941 82 y.o.  Admit date: 10/22/2022 Referring Physician Dr. Laurey Arrow Primary Physician Dr. Caryl Comes Primary Cardiologist Dr. Nehemiah Massed Reason for Consultation AoCHF  HPI: Carlos Levine is an 54yoM with a PMH of HFrEF (20%, global hypo), ?low flow AS (peak grad 24.82mmHg, 2.17m/s peak vel), paroxysmal AF on dose reduced eliquis per pt preference, SSS s/p Medtronic Micra leadless PPM, CKD 3, DM2, PAD s/p L common and ext iliac stents, hx prostate cancer, hx lung cancer s/p XRT who presented to [facility] ED 10/22/22 with worsening left foot swelling and redness at the request of the heart failure clinic.  He was recently admitted from 12/13 - 12/22 for decompensated HFrEF c/b orthostasis and cellulitis of bilateral LE. Cardiology was consulted for assistance with his heart failure. He was diuresed with IV lasix and clinically improved. Echo this admission showed improved EF 35-40%, global hypo, severe 4 chamber dilation (valve eval limited). He underwent LE angiography on 1/5 which revealed extensive calcific plaques of bilateral common femoral arteries. Vascular is recommending open endarterectomy + stenting, but procedure postponed due to worsening mentation & respiratory failure requiring bipap and further IV diuresis.   Interval History:  - somnolent and minimally interactive this AM. Gurgling respirations and coughing. Seen with nurse at bedside attempting to feed + admin meds. Patient pocketing them in his mouth/spitting them out - Weaned from BIPAP to [place], slightly hypernatremic, renal function ok after diuresis.   Review of systems limited by mental status / somnolence    Past Medical History:  Diagnosis Date   Arrhythmia    PAF   Arthritis    Cancer ([place])    prostate   CHF (congestive heart failure) (HCC)    Chronic kidney disease    COPD (chronic obstructive pulmonary disease) (HCC)     Coronary artery disease    Diabetes mellitus without complication ([place])    Dyspnea    with minimal exertion   Dysrhythmia    history of SVT   History of kidney stones    History of radiation therapy    lung   Hyperkalemia 11/19/2017   Hyperlipidemia    Hypertension    Peripheral vascular disease (HCC)    Prostate cancer ([place])    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   also has history of skin cancer    Past Surgical History:  Procedure Laterality Date   AMPUTATION FINGER / THUMB Right    BACK SURGERY  1975   removed 2 discs and fused others   LOWER EXTREMITY ANGIOGRAPHY Left 10/24/2022   Procedure: Lower Extremity Angiography;  Surgeon: Katha Cabal, MD;  Location: [place] [facility];  Service: Cardiovascular;  Laterality: Left;   PACEMAKER LEADLESS INSERTION N/A 07/25/2020   Procedure: PACEMAKER LEADLESS INSERTION;  Surgeon: Isaias Cowman, MD;  Location: [place] [facility];  Service: Cardiovascular;  Laterality: N/A;   PROSTATE CRYOABLATION     SPINAL FUSION     STENT PLACEMENT ILIAC ([place] HX) Left 2014   Left common iliac and left external iliac by Dr. Delana Meyer   VIDEO BRONCHOSCOPY WITH ENDOBRONCHIAL NAVIGATION N/A 05/30/2019   Procedure: [place], DIABETIC;  Surgeon: Ottie Glazier, MD;  Location: [facility] ORS;  Service: Thoracic;  Laterality: N/A;   VIDEO BRONCHOSCOPY WITH ENDOBRONCHIAL NAVIGATION N/A 05/28/2020   Procedure: VIDEO BRONCHOSCOPY WITH ENDOBRONCHIAL NAVIGATION;  Surgeon: Ottie Glazier, MD;  Location: [facility]  ORS;  Service: Thoracic;  Laterality: N/A;   VIDEO BRONCHOSCOPY WITH ENDOBRONCHIAL ULTRASOUND N/A 05/30/2019   Procedure: VIDEO BRONCHOSCOPY WITH ENDOBRONCHIAL ULTRASOUND, DIABETIC;  Surgeon: Ottie Glazier, MD;  Location: [facility] ORS;  Service: Thoracic;  Laterality: N/A;   VIDEO BRONCHOSCOPY WITH ENDOBRONCHIAL ULTRASOUND N/A 05/28/2020   Procedure: VIDEO BRONCHOSCOPY WITH ENDOBRONCHIAL ULTRASOUND;  Surgeon:  Ottie Glazier, MD;  Location: [facility] ORS;  Service: Thoracic;  Laterality: N/A;    Medications Prior to Admission  Medication Sig Dispense Refill Last Dose   apixaban (ELIQUIS) 2.5 MG TABS tablet Take 1 tablet (2.5 mg total) by mouth every 12 (twelve) hours. 60 tablet 3 10/22/2022   ascorbic acid (VITAMIN C) 500 MG tablet Take 1 tablet (500 mg total) by mouth 2 (two) times daily. 60 tablet 1 10/22/2022   bacitracin ointment Apply topically 2 (two) times daily. Apply to blisters on legs 120 g 0 10/22/2022   bicalutamide (CASODEX) 50 MG tablet Take 1 tablet (50 mg total) by mouth daily at 12 noon. 90 tablet 3 10/22/2022   dapagliflozin propanediol (FARXIGA) 10 MG TABS tablet Take 1 tablet (10 mg total) by mouth daily. 30 tablet 1 10/22/2022   feeding supplement (ENSURE ENLIVE / ENSURE PLUS) LIQD Take 237 mLs by mouth 3 (three) times daily between meals. 237 mL 12 10/22/2022   ipratropium-albuterol (DUONEB) 0.5-2.5 (3) MG/3ML SOLN Take 3 mLs by nebulization 2 (two) times daily.   10/22/2022   Multiple Vitamin (MULTIVITAMIN WITH MINERALS) TABS tablet Take 1 tablet by mouth daily.   10/22/2022   pravastatin (PRAVACHOL) 80 MG tablet Take 80 mg by mouth at bedtime.    10/21/2022   pregabalin (LYRICA) 50 MG capsule Take 1 capsule (50 mg total) by mouth 2 (two) times daily. 10 capsule 0 10/22/2022   traZODone (DESYREL) 50 MG tablet Take 0.5 tablets (25 mg total) by mouth at bedtime as needed for sleep. 10 tablet 0 10/21/2022   zinc sulfate 220 (50 Zn) MG capsule Take 1 capsule (220 mg total) by mouth daily. 30 capsule 1 10/22/2022   bisacodyl (DULCOLAX) 5 MG EC tablet Take 1 tablet (5 mg total) by mouth daily as needed for moderate constipation. 30 tablet 0 prn at prn   furosemide (LASIX) 20 MG tablet Take 1 tablet (20 mg total) by mouth 2 (two) times daily. 60 tablet 1    midodrine (PROAMATINE) 10 MG tablet Take 1 tablet (10 mg total) by mouth 3 (three) times daily with meals. (Patient not taking: Reported on 10/22/2022) 90 tablet  1 Not Taking   polyethylene glycol (MIRALAX / GLYCOLAX) 17 g packet Take 17 g by mouth daily. (Patient not taking: Reported on 10/22/2022) 14 each 0 Not Taking   valACYclovir (VALTREX) 500 MG tablet Take 500 mg by mouth 3 (three) times daily as needed. (Patient not taking: Reported on 10/22/2022)   Not Taking    Social History   Socioeconomic History   Marital status: Married    Spouse name: [name]   Number of children: 4   Years of education: Not on file   Highest education level: Not on file  Occupational History   Occupation: Games developer    Comment: retired  Tobacco Use   Smoking status: Former    Years: 30.00    Types: Cigarettes    Quit date: 10/20/2009    Years since quitting: 13.0   Smokeless tobacco: Never  Vaping Use   Vaping Use: Never used  Substance and Sexual Activity   Alcohol use: No  Drug use: No   Sexual activity: Yes    Birth control/protection: None  Other Topics Concern   Not on file  Social History Narrative   Patient lives with wife and occasionally daughter and granddaughter.   Social Determinants of Health   Financial Resource Strain: Low Risk  (04/13/2019)   Overall Financial Resource Strain (CARDIA)    Difficulty of Paying Living Expenses: Not hard at all  Food Insecurity: No Food Insecurity (10/23/2022)   Hunger Vital Sign    Worried About Running Out of Food in the Last Year: Never true    Ran Out of Food in the Last Year: Never true  Transportation Needs: No Transportation Needs (10/23/2022)   PRAPARE - Hydrologist (Medical): No    Lack of Transportation (Non-Medical): No  Physical Activity: Unknown (04/13/2019)   Exercise Vital Sign    Days of Exercise per Week: 0 days    Minutes of Exercise per Session: Not on file  Stress: Stress Concern Present (04/13/2019)   [place]    Feeling of Stress : To some extent  Social Connections: Moderately Isolated  (04/13/2019)   Social Connection and Isolation Panel [NHANES]    Frequency of Communication with Friends and Family: More than three times a week    Frequency of Social Gatherings with Friends and Family: Not on file    Attends Religious Services: Never    Active Member of Clubs or Organizations: No    Attends Archivist Meetings: Never    Marital Status: Married  Human resources officer Violence: Not At Risk (10/23/2022)   Humiliation, Afraid, Rape, and Kick questionnaire    Fear of Current or Ex-Partner: No    Emotionally Abused: No    Physically Abused: No    Sexually Abused: No    Family History  Problem Relation Age of Onset   Lung cancer Father    Pancreatic cancer Father    Brain cancer Mother    Stomach cancer Paternal Aunt    Stomach cancer Paternal Uncle    Cancer Maternal Grandmother    Kidney cancer Maternal Grandfather       Intake/Output Summary (Last 24 hours) at 10/30/2022 0818 Last data filed at 10/30/2022 0417 Gross per 24 hour  Intake 925.72 ml  Output 2670 ml  Net -1744.28 ml     Vitals:   10/29/22 1700 10/29/22 1956 10/29/22 2338 10/30/22 0414  BP:  116/64 (!) 104/59 111/60  Pulse:  78 99 99  Resp: 18 20 16 18   Temp:  97.6 F (36.4 C) 97.7 F (36.5 C) 98.3 F (36.8 C)  TempSrc:   Oral Oral  SpO2:  100% 100% 100%  Weight:      Height:        PHYSICAL EXAM General: Elderly and ill-appearing Caucasian male, sitting upright in bed with eyes closed. Moving extremities spontaneously, but cannot stay awake to answer questions HEENT:  Normocephalic and atraumatic. Neck:  + JVD.  Lungs: coarse respirations anteriorly, decreased breath sounds Heart: irregularly irregular with controlled rate .  Distant heart sounds. normal S1 and S2 with 2/6 systolic murmur best heard at RUSB Abdomen: Mildly distended appearing.  Msk: generalized weakness Extremities: 2+ dependent edema in both LE, kerlix dressing on both ankle. erythema resolved Neuro: somnolent,  moves all extremities. General upper extremity tremulousness  Psych: unable to assess  Labs: Basic Metabolic Panel: Recent Labs    10/29/22 0039 10/30/22  0313  NA 144 146*  K 4.5 4.0  CL 106 106  CO2 28 27  GLUCOSE 90 151*  BUN 84* 82*  CREATININE 1.65* 1.67*  CALCIUM 8.3* 8.3*    Liver Function Tests: No results for input(s): "AST", "ALT", "ALKPHOS", "BILITOT", "PROT", "ALBUMIN" in the last 72 hours.  No results for input(s): "LIPASE", "AMYLASE" in the last 72 hours. CBC: Recent Labs    10/29/22 0039 10/30/22 0313  WBC 8.1 7.5  HGB 12.7* 11.9*  HCT 42.3 38.3*  MCV 102.2* 98.7  PLT 145* 144*    Cardiac Enzymes: Recent Labs    10/28/22 1122  TROPONINIHS 86*    BNP: Recent Labs    10/28/22 1122  BNP 1,345.4*    D-Dimer: No results for input(s): "DDIMER" in the last 72 hours. Hemoglobin A1C: No results for input(s): "HGBA1C" in the last 72 hours. Fasting Lipid Panel: No results for input(s): "CHOL", "HDL", "LDLCALC", "TRIG", "CHOLHDL", "LDLDIRECT" in the last 72 hours. Thyroid Function Tests: No results for input(s): "TSH", "T4TOTAL", "T3FREE", "THYROIDAB" in the last 72 hours.  Invalid input(s): "FREET3" Anemia Panel: No results for input(s): "VITAMINB12", "FOLATE", "FERRITIN", "TIBC", "IRON", "RETICCTPCT" in the last 72 hours.   Radiology: [facility] Chest Port 1 View  Result Date: 10/28/2022 CLINICAL DATA:  Encephalopathy EXAM: PORTABLE CHEST 1 VIEW COMPARISON:  10/22/2022, 10/01/2022 FINDINGS: Loop recorder. Stable cardiomegaly. Aortic atherosclerosis. Chronic post treatment changes within the right hemithorax with volume loss and fibrosis. Moderate right pleural effusion, similar to prior. Slightly increasing interstitial markings within the left lung. No left-sided effusion. No pneumothorax. IMPRESSION: 1. Slightly increasing interstitial markings within the left lung, which may reflect edema or atypical/viral infection. 2. Otherwise, no significant interval  change. Electronically Signed   By: Davina Poke D.O.   On: 10/28/2022 10:59   CT HEAD WO CONTRAST (5MM)  Result Date: 10/28/2022 CLINICAL DATA:  Encephalopathy EXAM: CT HEAD WITHOUT CONTRAST TECHNIQUE: Contiguous axial images were obtained from the base of the skull through the vertex without intravenous contrast. RADIATION DOSE REDUCTION: This exam was performed according to the departmental dose-optimization program which includes automated exposure control, adjustment of the mA and/or kV according to patient size and/or use of iterative reconstruction technique. COMPARISON:  06/11/2017, 04/07/2019 FINDINGS: Brain: No evidence of acute infarction, hemorrhage, hydrocephalus, extra-axial collection or mass lesion/mass effect. Moderate low-density changes within the periventricular and subcortical white matter compatible with chronic microvascular ischemic change. Moderate diffuse cerebral volume loss. Vascular: Atherosclerotic calcifications involving the large vessels of the skull base. No unexpected hyperdense vessel. Skull: Normal. Negative for fracture or focal lesion. Sinuses/Orbits: No acute finding. Other: None. IMPRESSION: 1. No acute intracranial findings. 2. Chronic microvascular ischemic change and cerebral volume loss. Electronically Signed   By: Davina Poke D.O.   On: 10/28/2022 10:53   ECHOCARDIOGRAM COMPLETE  Result Date: 10/25/2022    ECHOCARDIOGRAM REPORT   Patient Name:   ISSA KOSMICKI Date of Exam: 10/24/2022 Medical Rec #:  [id number]      Height:       68.0 in Accession #:    [id number]     Weight:       201.5 lb Date of Birth:  08-31-1941      BSA:          2.050 m Patient Age:    69 years       BP:           92/72 mmHg Patient Gender: M  HR:           94 bpm. Exam Location:  [facility] Procedure: 2D Echo, Cardiac Doppler and Color Doppler Indications:     CHF--acute systolic E95.28  History:         Patient has prior history of Echocardiogram examinations, most                   recent 05/29/2019. CHF, COPD; Risk Factors:Hypertension.  Sonographer:     Sherrie Sport Referring Phys:  [id number] Kulm Editha Bridgeforth Diagnosing Phys: Neoma Laming  Sonographer Comments: Technically difficult study due to poor echo windows, no apical window and no parasternal window. Image acquisition challenging due to uncooperative patient. IMPRESSIONS  1. Left ventricular ejection fraction, by estimation, is 35 to 40%. The left ventricle has moderately decreased function. The left ventricle demonstrates global hypokinesis. The left ventricular internal cavity size was severely dilated. There is mild left ventricular hypertrophy. Left ventricular diastolic function could not be evaluated.  2. Right ventricular systolic function is moderately reduced. The right ventricular size is severely enlarged. Mildly increased right ventricular wall thickness.  3. Left atrial size was severely dilated.  4. Right atrial size was severely dilated.  5. The mitral valve was not well visualized. No evidence of mitral valve regurgitation.  6. The aortic valve was not well visualized. Aortic valve regurgitation is not visualized. Aortic valve sclerosis/calcification is present, without any evidence of aortic stenosis.  7. Aortic unable to see. FINDINGS  Left Ventricle: Left ventricular ejection fraction, by estimation, is 35 to 40%. The left ventricle has moderately decreased function. The left ventricle demonstrates global hypokinesis. The left ventricular internal cavity size was severely dilated. There is mild left ventricular hypertrophy. Left ventricular diastolic function could not be evaluated. Right Ventricle: The right ventricular size is severely enlarged. Mildly increased right ventricular wall thickness. Right ventricular systolic function is moderately reduced. Left Atrium: Left atrial size was severely dilated. Right Atrium: Right atrial size was severely dilated. Pericardium: The pericardium was not assessed. Mitral  Valve: The mitral valve was not well visualized. No evidence of mitral valve regurgitation. Tricuspid Valve: The tricuspid valve is not assessed. Tricuspid valve regurgitation is not demonstrated. Aortic Valve: The aortic valve was not well visualized. Aortic valve regurgitation is not visualized. Aortic valve sclerosis/calcification is present, without any evidence of aortic stenosis. Pulmonic Valve: The pulmonic valve was not assessed. Pulmonic valve regurgitation is not visualized. Aorta: Unable to see. IAS/Shunts: The interatrial septum was not assessed.  LEFT VENTRICLE PLAX 2D LVIDd:         4.50 cm LVIDs:         3.70 cm LV PW:         1.20 cm LV IVS:        1.10 cm  LEFT ATRIUM         Index LA diam:    5.10 cm 2.49 cm/m Neoma Laming Electronically signed by Neoma Laming Signature Date/Time: 10/25/2022/9:08:14 AM    Final    PERIPHERAL VASCULAR CATHETERIZATION  Result Date: 10/24/2022 See surgical note for result.  US ARTERIAL LOWER EXTREMITY DUPLEX BILATERAL  Result Date: 10/23/2022 CLINICAL DATA:  82 year old male with a infection EXAM: NONINVASIVE PHYSIOLOGIC VASCULAR STUDY OF BILATERAL LOWER EXTREMITIES TECHNIQUE: Evaluation of both lower extremities was performed at rest, including calculation of ankle-brachial indices, directed duplex COMPARISON:  None Available. FINDINGS: Right ABI:  Not acquired Left ABI:  Not acquired Right Lower Extremity: Directed duplex of the right lower extremity demonstrates  triphasic waveform of the common femoral artery, profunda femoris and proximal SFA. Distal SFA and popliteal artery demonstrate monophasic waveform. Posterior tibial artery and anterior tibial artery monophasic. No elevated velocity identified. Left Lower Extremity: Directed duplex of the left lower extremity demonstrates monophasic postobstructive waveform of the common femoral artery, profunda femoris, SFA, popliteal artery. Monophasic posterior tibial artery and anterior tibial artery. The imaged  portions of the SFA are patent. No elevated velocities. IMPRESSION: Right: Directed duplex of the right lower extremity demonstrates evidence of multifocal SFA disease, contributing to monophasic popliteal and tibial artery waveforms. Left: Directed duplex of the left lower extremity demonstrates occlusive disease of the iliac system, with patency of the left femoropopliteal system, monophasic waveforms throughout. Signed, Dulcy Fanny. Nadene Rubins, RPVI [facility] Radiology Electronically Signed   By: Corrie Mckusick D.O.   On: 10/23/2022 14:12   DG Foot 2 Views Left  Result Date: 10/23/2022 CLINICAL DATA:  Left foot blisters and skin breakdown. EXAM: LEFT FOOT - 2 VIEW COMPARISON:  None Available. FINDINGS: No bony destruction or periosteal reaction. No acute fracture or dislocation. Joint spaces are preserved. Bone mineralization is normal. Dorsal foot soft tissue swelling. IMPRESSION: 1. Dorsal foot soft tissue swelling. No acute osseous abnormality. Electronically Signed   By: Titus Dubin M.D.   On: 10/23/2022 11:30   CT CHEST WO CONTRAST  Result Date: 10/23/2022 CLINICAL DATA:  Right-sided pleural effusion EXAM: CT CHEST WITHOUT CONTRAST TECHNIQUE: Multidetector CT imaging of the chest was performed following the standard protocol without IV contrast. RADIATION DOSE REDUCTION: This exam was performed according to the departmental dose-optimization program which includes automated exposure control, adjustment of the mA and/or kV according to patient size and/or use of iterative reconstruction technique. COMPARISON:  CT 04/01/2022, chest x-ray from the previous day. FINDINGS: Cardiovascular: Limited due to lack of IV contrast. Atherosclerotic calcifications are noted. Heavy coronary calcifications are seen. Heart is not significantly enlarged in size. Lead less pacemaker is noted within the distal aspect of the right ventricle. Mediastinum/Nodes: Thoracic  inlet is within normal limits. No hilar or mediastinal adenopathy is noted. The esophagus as visualized is within normal limits. Lungs/Pleura: Mild emphysematous changes are noted in the left lung. No focal infiltrate or sizable effusion is seen. No sizable parenchymal nodules are noted. On the right there is a large pleural effusion which has increased in the interval from the prior exam. Lower lobe consolidation is noted as well as some upper lobe consolidation likely related to prior treatment. Fullness in the right hilum is noted stable from previous exams. Upper Abdomen: Visualized upper abdomen is within normal limits. Musculoskeletal: No rib abnormality is noted. Degenerative changes of thoracic spine are seen. IMPRESSION: Increasing consolidation in the right lower and right upper lobes when compared with the prior exam from June of 2023. Increasing right-sided pleural effusion is noted as well. Persistent volume loss in fullness in the right hilum is noted related to prior therapy. No other focal abnormality is noted. Aortic Atherosclerosis (ICD10-I70.0) and Emphysema (ICD10-J43.9). Electronically Signed   By: [name] M.D.   On: 10/23/2022 01:47   DG Chest Portable 1 View  Result Date: 10/22/2022 CLINICAL DATA:  Nonhealing left foot wound and shortness of breath. EXAM: PORTABLE CHEST 1 VIEW COMPARISON:  October 01, 2022 FINDINGS: The cardiac silhouette is mildly enlarged and unchanged in size. There is marked severity calcification of the thoracic aorta. There is stable opacification of the mid right lung and right  lung base with subsequent right-sided volume loss. Mild, stable right upper lobe scarring and/or airspace disease is also seen. Chronic appearing increased lung markings are seen throughout the left lung. A stable moderate sized right-sided pleural effusion is noted. No pneumothorax is identified. Multilevel degenerative changes are seen throughout the thoracic spine. IMPRESSION: 1.  Stable post treatment changes throughout the right lung with subsequent right-sided volume loss. Electronically Signed   By: Virgina Norfolk M.D.   On: 10/22/2022 21:08   US Venous Img Lower Bilateral (DVT)  Result Date: 10/01/2022 CLINICAL DATA:  Leg pain and swelling. EXAM: BILATERAL LOWER EXTREMITY VENOUS DOPPLER ULTRASOUND TECHNIQUE: Gray-scale sonography with compression, as well as color and duplex ultrasound, were performed to evaluate the deep venous system(s) from the level of the common femoral vein through the popliteal and proximal calf veins. COMPARISON:  None Available. FINDINGS: VENOUS Normal compressibility of the common femoral, superficial femoral, and popliteal veins, as well as the visualized calf veins. Visualized portions of profunda femoral vein and great saphenous vein unremarkable. No filling defects to suggest DVT on grayscale or color Doppler imaging. Doppler waveforms show normal direction of venous flow, normal respiratory plasticity and response to augmentation. OTHER Subcutaneous soft tissue edema bilateral calf. Limitations: none IMPRESSION: Negative. Electronically Signed   By: Keane Police D.O.   On: 10/01/2022 22:48   DG Chest 2 View  Result Date: 10/01/2022 CLINICAL DATA:  Leg swelling. EXAM: CHEST - 2 VIEW COMPARISON:  06/29/2022 FINDINGS: Stable significant post treatment changes involving the right hemithorax with loss of volume and changes of radiation fibrosis. Associated right pleural effusion. The left lung remains relatively clear. No pulmonary lesions, overt pulmonary edema or left pleural effusion. IMPRESSION: 1. Stable significant post treatment changes involving the right hemithorax. 2. The left lung remains relatively clear. No left-sided pleural effusion. Electronically Signed   By: Marijo Sanes M.D.   On: 10/01/2022 11:58    ECHO 09/08/2022 ECHOCARDIOGRAPHIC MEASUREMENTS  2D DIMENSIONS  AORTA                  Values   Normal Range   MAIN PA          Values    Normal Range                Annulus: nm*          [2.3-2.9]         PA Main: nm*       [1.5-2.1]              Aorta Sin: nm*          [3.1-3.7]    RIGHT VENTRICLE            ST Junction: nm*          [2.6-3.2]         RV Base: nm*       [<4.2]             Asc.Aorta: nm*          [2.6-3.4]          RV Mid: nm*       [<3.5]  LEFT VENTRICLE                                      RV Length: nm*       [<8.6]  LVIDd: 5.0 cm       [4.2-5.9]    INFERIOR VENA CAVA                  LVIDs: 4.7 cm                        Max. IVC: nm*       [<=2.1]                    FS: 5.4 %        [>25]            Min. IVC: nm*                    SWT: 0.83 cm      [0.6-1.0]    ------------------                    PWT: 0.83 cm      [0.6-1.0]    nm* - not measured  LEFT ATRIUM                LA Diam: 4.6 cm       [3.0-4.0]            LA A4C Area: nm*          [<20]             LA Volume: nm*          [18-58]  _________________________________________________________________________________________  ECHOCARDIOGRAPHIC DESCRIPTIONS  AORTIC ROOT                   Size: Normal             Dissection: INDETERM FOR DISSECTION  AORTIC VALVE               Leaflets: Tricuspid                   Morphology: SEVERELY THICKENED               Mobility: PARTIALLY MOBILE  LEFT VENTRICLE                   Size: Normal                        Anterior: HYPOCONTRACTILE            Contraction: Normal                         Lateral: HYPOCONTRACTILE             Closest EF: 20% (Estimated)                 Septal: HYPOCONTRACTILE              LV Masses: No Masses                       Apical: HYPOCONTRACTILE                    LVH: None                          Inferior: HYPOCONTRACTILE  Posterior: HYPOCONTRACTILE           Dias.FxClass: N/A  MITRAL VALVE               Leaflets: Normal                        Mobility: Fully mobile             Morphology:  THICKENED LEAFLET(S)  LEFT ATRIUM                   Size: MILDLY ENLARGED              LA Masses: No masses              IA Septum: Normal IAS  MAIN PA                   Size: Normal  PULMONIC VALVE             Morphology: Normal                        Mobility: Fully mobile  RIGHT VENTRICLE              RV Masses: No Masses                         Size: Normal              Free Wall: Normal                     Contraction: Normal  TRICUSPID VALVE               Leaflets: Normal                        Mobility: Fully mobile             Morphology: Normal  RIGHT ATRIUM                   Size: Normal                        RA Other: None                RA Mass: No masses  PERICARDIUM                 Fluid: No effusion  INFERIOR VENACAVA                   Size: Normal Normal respiratory collapse  _________________________________________________________________________________________   DOPPLER ECHO and OTHER SPECIAL PROCEDURES                 Aortic: MILD AR                    SEVERE AS                         249.0 cm/sec peak vel      24.8 mmHg peak grad                         14.0 mmHg mean grad        0.80 cm^2 by DOPPLER                 Mitral:  MILD MR                    No MS                         MV Inflow E Vel = nm*      MV Annulus E'Vel = nm*                         E/E'Ratio = nm*              Tricuspid: MILD TR                    No TS                         313.0 cm/sec peak TR vel   54.2 mmHg peak RV pressure              Pulmonary: MILD PR                    No PS  _________________________________________________________________________________________  INTERPRETATION  SEVERE LV SYSTOLIC DYSFUNCTION (See above)  NORMAL RIGHT VENTRICULAR SYSTOLIC FUNCTION  MILD VALVULAR REGURGITATION (See above)  SEVERE VALVULAR STENOSIS (See above)  Poor sound wave transmission significantly limited this study - pt with significant AS  and MS   _________________________________________________________________________________________  Electronically signed by      Rusty Aus, MD on 09/09/2022 08: 36 PM           Performed By: Scherrie November, RCS     Ordering Physician: Ramonita Lab   TELEMETRY reviewed by me (LT) 10/30/2022 : AF 80s-90s  EKG reviewed by me: Ectopic atrial rhythm with rate 98 bpm  Data reviewed by me (LT) 10/30/2022: hospitalist progress note, nursing notes, cbc, bmp, bnp, cxr I/O  Principal Problem:   Cellulitis in diabetic foot ([place]) Active Problems:   Benign essential hypertension   Stage 3b chronic kidney disease (CKD) ([place])   Type 2 diabetes mellitus with diabetic chronic kidney disease ([place])   Pure hypercholesterolemia   Squamous cell lung cancer, right ([place])   Sick sinus syndrome (HCC)   PAF (paroxysmal atrial fibrillation) (HCC)   Normocytic anemia   Cellulitis   HFrEF (heart failure with reduced ejection fraction) (HCC)   Peripheral edema    ASSESSMENT AND PLAN:  Carlos Locket. Bentler is an 36yoM with a PMH of HFrEF (20%, global hypo), ?low flow AS (peak grad 24.73mmHg, 2.40m/s peak vel), paroxysmal AF on dose reduced eliquis per pt preference, SSS s/p [employer] Micra leadless PPM, CKD 3, DM2, PAD s/p L common and ext iliac stents, hx prostate cancer, hx lung cancer s/p XRT who presented to [facility] ED 10/22/22 with worsening left foot swelling and redness at the request of the heart failure clinic.  He was recently admitted from 12/13 - 12/22 for decompensated HFrEF c/b orthostasis and cellulitis of bilateral LE. Cardiology was consulted for assistance with his heart failure. He was diuresed with IV lasix and clinically improved. Echo this admission showed improved EF 35-40%, global hypo, severe 4 chamber dilation (valve eval limited). He underwent LE angiography on 1/5 which revealed extensive calcific plaques of bilateral common femoral arteries. Vascular is recommending open endarterectomy + stenting, but  procedure postponed due to worsening mentation & respiratory failure requiring bipap and further IV diuresis.   # acute encephalopathy  Lethargic  and minimally responsive the AM of 1/9, rapid response called. ABG with hypercarbia, CXR with increased pulm edema, head CT neg for acute finding. Diuretics were held for a day following initial clinical improvement and worsening Cr, possibly contributory to hypercarbia.  -weaned from bipap and back on [place] oxygen, although still encephalopathic today.  -lyrica discontinued today  -consider further [place] conversations / palliative involvement.   # acute on chronic HFrEF (35-40%, prev<20%, global hypo) # dilated CM  # ?low flow AS Presents for his second admission in 1 month with worsening peripheral edema c/b LE cellulitis and nonhealing ulcers. Echo 08/2022 at his PCP's office showed an EF <20%, with mild-mod AS, possibly low flow/gradient with poor LV output. Repeat this admission showed higher EF, global hypo, and assessment of valves technically limited.  BNP 1400 on admission, clinically improved following IV diuresis and transitioned to PO lasix. Diuretics were held d/t increasing Cr. Cxr 1/9 following rapid response with worsening interstitial edema, repeat BNP 1300.  - s/p IV lasix 60 x1 on 1/9 and 40mg  IV x 3 and net neg 2.7L. clinically hypervolemic with dependent LE edema, although improved.  - GDMT initiation/titration limited by hypotension. Was given a dose of metoprolol XL 12.5mg  once daily on 1/3, but BP too low for more. - salt and fluid restriction, strict I/O, daily weights   # LE cellulitis # severe PAD s/p left common and ext iliac stents - on abx per primary team -vascular planning for open endarterectomy + stenting. Following rapid response the AM of 1/9 requiring BIPAP, appropriateness for vascular surgery / [place] will need to be reassessed. Would recommend diuresis today and tomorrow AM and better compensation from a HF standpoint  prior to proceeding with vascular procedure.   # paroxysmal AF RVR # SSS s/p Micra PPM  In AF on tele, rate controlled in the 90s to low 100s -unable to tolerate metop -eliquis switched to heparin on 1/7 in anticipation of vascular procedure. On 2.5mg  BID at home, per the patient's preference although he does not meet criteria for this dosing, will need to revisit this   # AKI on CKD 3  Baseline from discharge on 12/22 44/1.16, GFR >60. On admission Cr. 1.44 and GFR 48. With Cr trend 1.69 --1.68 -- 1.60--1.65, current GFR 41  This patient's plan of care was discussed and created with Dr. Clayborn Bigness and he is in agreement.  Signed: Tristan Schroeder , PA-C 10/30/2022, 8:18 AM [facility]

## 2022-10-30 NOTE — Care Management Important Message (Signed)
Important Message  Patient Details  Name: Carlos Levine MRN: [id number] Date of Birth: [birth year]   Medicare Important Message Given:  Yes     [name] 10/30/2022, 1:45 PM

## 2022-10-31 ENCOUNTER — Inpatient Hospital Stay: Payer: Medicare Other

## 2022-10-31 DIAGNOSIS — Z7189 Other specified counseling: Secondary | ICD-10-CM

## 2022-10-31 DIAGNOSIS — E11628 Type 2 diabetes mellitus with other skin complications: Secondary | ICD-10-CM | POA: Diagnosis not present

## 2022-10-31 DIAGNOSIS — L03119 Cellulitis of unspecified part of limb: Secondary | ICD-10-CM | POA: Diagnosis not present

## 2022-10-31 LAB — BASIC METABOLIC PANEL
Anion gap: 10 (ref 5–15)
BUN: 70 mg/dL — ABNORMAL HIGH (ref 8–23)
CO2: 30 mmol/L (ref 22–32)
Calcium: 8.5 mg/dL — ABNORMAL LOW (ref 8.9–10.3)
Chloride: 107 mmol/L (ref 98–111)
Creatinine, Ser: 1.51 mg/dL — ABNORMAL HIGH (ref 0.61–1.24)
GFR, Estimated: 46 mL/min — ABNORMAL LOW (ref >60)
Glucose, Bld: 101 mg/dL — ABNORMAL HIGH (ref 70–99)
Potassium: 3.9 mmol/L (ref 3.5–5.1)
Sodium: 147 mmol/L — ABNORMAL HIGH (ref 135–145)

## 2022-10-31 LAB — CBC
HCT: 41.4 % (ref 39.0–52.0)
Hemoglobin: 12.4 g/dL — ABNORMAL LOW (ref 13.0–17.0)
MCH: 30.3 pg (ref 26.0–34.0)
MCHC: 30 g/dL (ref 30.0–36.0)
MCV: 101.2 fL — ABNORMAL HIGH (ref 80.0–100.0)
Platelets: 134 10*3/uL — ABNORMAL LOW (ref 150–400)
RBC: 4.09 MIL/uL — ABNORMAL LOW (ref 4.22–5.81)
RDW: 17.2 % — ABNORMAL HIGH (ref 11.5–15.5)
WBC: 7.2 10*3/uL (ref 4.0–10.5)
nRBC: 0 % (ref 0.0–0.2)

## 2022-10-31 LAB — GLUCOSE, CAPILLARY
Glucose-Capillary: 98 mg/dL (ref 70–99)
Glucose-Capillary: 102 mg/dL — ABNORMAL HIGH (ref 70–99)
Glucose-Capillary: 135 mg/dL — ABNORMAL HIGH (ref 70–99)
Glucose-Capillary: 127 mg/dL — ABNORMAL HIGH (ref 70–99)
Glucose-Capillary: 128 mg/dL — ABNORMAL HIGH (ref 70–99)

## 2022-10-31 LAB — HEPARIN LEVEL (UNFRACTIONATED): Heparin Unfractionated: 0.39 IU/mL (ref 0.30–0.70)

## 2022-10-31 MED ORDER — ENOXAPARIN SODIUM 150 MG/ML IJ SOSY
1.5000 mg/kg | PREFILLED_SYRINGE | Freq: Every day | INTRAMUSCULAR | Status: DC
  Administered 2022-10-31 – 2022-11-03 (×4): 141 mg via SUBCUTANEOUS
  Filled 2022-10-31 (×5): qty 0.94

## 2022-10-31 NOTE — Progress Notes (Signed)
Modified Barium Swallow Progress Note  Patient Details  Name: Carlos Levine MRN: [id number] Date of Birth: 13-Jul-1941  Today's Date: 10/31/2022  Modified Barium Swallow completed.  Full report located under Chart Review in the Imaging Section.  Brief recommendations include the following:  Clinical Impression  Pt presents with cognition based severe oral phase dysphagia that is c/b boluses escaping out of pt's mouth, oral holding without any response to multi-modal assistance to swallow. Pt able to produce 1 swallow with difficulty fulling observing pt's swallow d/t pt's inability to maintain his position in chair. Study determined d/t pt's increased frustration and inability to consistently produce a swallow. At this time, pt's prognosis for sustaining nutrition and hydration with PO intake are considered poor. Given pt's current cognitive state, he is unable to participate in skilled ST services. Recommend continued NPO. MD made aware of these results.   Swallow Evaluation Recommendations       SLP Diet Recommendations: NPO       Medication Administration: Via alternative means               Oral Care Recommendations: Oral care QID        Carlos Levine 10/31/2022,9:59 PM

## 2022-10-31 NOTE — Progress Notes (Signed)
ANTICOAGULATION CONSULT NOTE  Pharmacy Consult for heparin infusion initiation and monitoring Indication: atrial fibrillation  Allergies  Allergen Reactions   Contrast Media [Iodinated Contrast Media] Hives and Rash   Ace Inhibitors Other (See Comments)    Unknown reaction type   Latex Rash    Patient Measurements: Height: 5\' 8"  (172.7 cm) Weight: 93.7 kg (206 lb 9.1 oz) IBW/kg (Calculated) : 68.4 Heparin Dosing Weight: 87.3 kg  Vital Signs: Temp: 97.6 F (36.4 C) (01/12 0355) Temp Source: Axillary (01/12 0355) BP: 104/77 (01/12 0355) Pulse Rate: 93 (01/12 0355)  Labs: Recent Labs    10/28/22 1122 10/29/22 0039 10/29/22 0039 10/30/22 0313 10/31/22 0538  HGB  --  12.7*   < > 11.9* 12.4*  HCT  --  42.3  --  38.3* 41.4  PLT  --  145*  --  144* 134*  APTT  --  80*  --  66*  --   HEPARINUNFRC  --  0.55  --  0.38 0.39  CREATININE  --  1.65*  --  1.67* 1.51*  TROPONINIHS 86*  --   --   --   --    < > = values in this interval not displayed.     Estimated Creatinine Clearance: 41.2 mL/min (A) (by C-G formula based on SCr of 1.51 mg/dL (H)).   Medical History: Past Medical History:  Diagnosis Date   Arrhythmia    PAF   Arthritis    Cancer (HCC)    prostate   CHF (congestive heart failure) (HCC)    Chronic kidney disease    COPD (chronic obstructive pulmonary disease) (HCC)    Coronary artery disease    Diabetes mellitus without complication (HCC)    Dyspnea    with minimal exertion   Dysrhythmia    history of SVT   History of kidney stones    History of radiation therapy    lung   Hyperkalemia 11/19/2017   Hyperlipidemia    Hypertension    Peripheral vascular disease (HCC)    Prostate cancer (HCC)    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   also has history of skin cancer    Medications:  Scheduled:   vitamin C  500 mg Oral BID   feeding supplement  237 mL Oral TID BM   insulin aspart  0-15 Units Subcutaneous TID WC   insulin aspart  0-5 Units  Subcutaneous QHS   multivitamin  1 tablet Oral Daily   polyethylene glycol  17 g Oral Daily   pravastatin  80 mg Oral QHS   senna  1 tablet Oral Daily   sodium chloride flush  3 mL Intravenous Q12H    Assessment: 82 y/o male with h/o CHF, COPD, DM, HLD, HTN, CAD, pacemaker, prostate cancer, SCLC s/p XRT, CKD III and blisters on bilateral feet who is admitted with foot infection and cellulitis. Patient is being transitioned to heparin from Eliquis after speaking with primary service to facilitate option of surgery   apixaban last dose 10/25/22 2157  Goal of Therapy:  aPTT 66 - 102  seconds Monitor platelets by anticoagulation protocol: Yes   1/7 @ 1813: aPTT >130 sec, supratherapeutic @ 1300 u/hr 1/8 @ 0355: aPTT= 145 sec, HL = > 1.10 1/8 @ 1436: aPTT= 81 sec 1/8 2239 aPTT = 81 1/9 0359 aPTT 74, therapeutic x 3, HL 0.64 1/10 0039 aPTT 80, therapeutic x 4, HL 0.55 1/11 0313 HL  0.38/aPTT 66, therapeutic x 5 1/12 0538 HL  0.39, therapeutic x 6  Plan:  - Continue current heparin infusion at 750 units/hr.  - Recheck HL w/ AM labs daily while therapeutic - Continue to monitor H&H and platelets  Otelia Sergeant, PharmD, [facility] 10/31/2022 6:22 AM

## 2022-10-31 NOTE — Plan of Care (Signed)

## 2022-10-31 NOTE — Progress Notes (Signed)
[facility]  -  at [facility]   PATIENT NAME: Carlos Levine    MR#:  [id number]  DATE OF BIRTH:  [birth year]  SUBJECTIVE:  No family at bedside patient still confused. Not able to hold a meaningful conversation NPO due to high risk for aspiration and pocketing food in mouth per RN  VITALS:  Blood pressure 126/74, pulse (!) 48, temperature 97.8 F (36.6 C), temperature source Oral, resp. rate 20, height 5\' 8"  (1.727 m), weight 93.7 kg, SpO2 92 %.  PHYSICAL EXAMINATION:   GENERAL:  82 y.o.-year-old patient lying in the bed with no acute distress. Confused on [place] oxygen LUNGS: decreased breath sounds bilaterally, no wheezing CARDIOVASCULAR: S1, S2 normal. No murmur   ABDOMEN: Soft, nontender, nondistended. EXTREMITIES:   NEUROLOGIC: unable to assess   LABORATORY PANEL:  CBC Recent Labs  Lab 10/31/22 0538  WBC 7.2  HGB 12.4*  HCT 41.4  PLT 134*     Chemistries  Recent Labs  Lab 10/30/22 0313 10/31/22 0538  NA 146* 147*  K 4.0 3.9  CL 106 107  CO2 27 30  GLUCOSE 151* 101*  BUN 82* 70*  CREATININE 1.67* 1.51*  CALCIUM 8.3* 8.5*  MG 2.4  --     Cardiac Enzymes No results for input(s): "TROPONINI" in the last 168 hours. RADIOLOGY:  No results found.  Assessment and Plan Carlos Levine is a 82 y.o. male with medical history significant of hypertension, hyperlipidemia, Dm2 with CKD stage 3, sick sinus syndrome, chronic CHFrEF, Pafib, Elevated troponin, hx of prostate cancer w recent admission for cellulitis apparently presents due to worsening foot swelling and redness.  Pt was told by cardiologist to come to ED.     Acute metabolic encephalopathy suspected due to hypercarbic respiratory failure with acidosis ?cognitive decline -- patient was placed on BiPAP yesterday currently on 2 L nasal cannula oxygen. Mentation is still waxing -- PRN nebs -- IV Lasix BID good diuresis.  --hold further lasix due to sodium trending up and pt appears dry  clincally --d/c Lyrica/sedating meds --still remains confused --CT head--moderate atrophy  Diabetic foot infection severe peripheral arterial disease chronic lower extremity edema -- continue IV cefepime (known to cause mental status changes)-- d/w ID  RPH--completed 7 days of IV abxs --d/c for now and monitor --wbc normal, no fever  Severe PAD -- vascular surgery consultation appreciated -- angiograms show severe bilateral lower extremity disease -- vascular recommending bilateral femoral endarterectomy with standing of SFA and above knee popliteal artery. -- Lengthy surgery will require patient's cardiac and pulmonary status be stable -- continue IV heparin drip --spoke with pt's wife 84 on the phone--she understands overall a poor prognosis. Will await d/w Dr [name] later today. --Dr Gilda Crease d/w family (wife and dter0 on 10/30/22--and at present cont dressing changes. He thinks pt likely will not do good with either endarterectomy or bilateral amputation.  Severe cardiomyopathy -- EF currently 35 to 40% with global hypokinesis -- received few IV Lasix, monitor input output and metabolic panel --Clinically appears a bit dry--Holding lasix today -- [facility] cardiology input appreciated --hold Home farxiga   A-fib, paroxysmal HR currently controlled - holding apixaban, started heparin 1/7, in anticipation of possible vascular surgery --will change to Lovenox for now till pt able to take po meds safely (d/w RPH)    CAD - cont home statin   T2DM Glucose mild elevation - continue SSI   Peripheral Neuropathy - cont home pregabalin   CKD 3b Kidney  function at baseline though cr rising a bit --cont IVF   H/o Sick sinus Has pacemaker   h/o  Lung cancer In remission, surveilled by oncology  Nutrition Status: Nutrition Problem: Moderate Malnutrition Etiology: chronic illness (COPD, CHF, prostate cancer, lung cancer) Signs/Symptoms: moderate fat depletion, severe fat  depletion, moderate muscle depletion, severe muscle depletion  ST to see due to poor inability to swallow/MBSS    palliative care consultation. D/w Wife on the phone and she does understand overall poor prognosis however she feels if he comes home he will do better. Discussed that pt needs to eat, work with PT before e I can decide on dc. She is in agreement with Palliative care discussion. Spoke with dter Carlos Levine on the phone and she wants pt's wife to make decisions.  Procedures: angiogram LE Family communication :wife on the phone 1/12 Consults :cardiology, Vascular CODE STATUS: DNR DVT Prophylaxis lovenox Level of care: Progressive Status is: Inpatient Remains inpatient appropriate because: AMS,Resp failure, severe PAD    TOTAL TIME TAKING CARE OF THIS PATIENT: 35 minutes.  >50% time spent on counselling and coordination of care  Note: This dictation was prepared with Dragon dictation along with smaller phrase technology. Any transcriptional errors that result from this process are unintentional.  [name] M.D    [facility]   CC: Primary care physician; Lynnea Ferrier, MD[facility]  - [place] at [facility]

## 2022-10-31 NOTE — Evaluation (Signed)
Clinical/Bedside Swallow Evaluation Patient Details  Name: Carlos Levine MRN: [id number] Date of Birth: 1941/06/12  Today's Date: 10/31/2022 Time: SLP Start Time (ACUTE ONLY): 0920 SLP Stop Time (ACUTE ONLY): 1000 SLP Time Calculation (min) (ACUTE ONLY): 40 min  Past Medical History:  Past Medical History:  Diagnosis Date   Arrhythmia    PAF   Arthritis    Cancer (HCC)    prostate   CHF (congestive heart failure) (HCC)    Chronic kidney disease    COPD (chronic obstructive pulmonary disease) (HCC)    Coronary artery disease    Diabetes mellitus without complication (HCC)    Dyspnea    with minimal exertion   Dysrhythmia    history of SVT   History of kidney stones    History of radiation therapy    lung   Hyperkalemia 11/19/2017   Hyperlipidemia    Hypertension    Peripheral vascular disease (HCC)    Prostate cancer (HCC)    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   also has history of skin cancer   Past Surgical History:  Past Surgical History:  Procedure Laterality Date   AMPUTATION FINGER / THUMB Right    BACK SURGERY  1975   removed 2 discs and fused others   LOWER EXTREMITY ANGIOGRAPHY Left 10/24/2022   Procedure: Lower Extremity Angiography;  Surgeon: Renford Dills, MD;  Location: [facility];  Service: Cardiovascular;  Laterality: Left;   PACEMAKER LEADLESS INSERTION N/A 07/25/2020   Procedure: PACEMAKER LEADLESS INSERTION;  Surgeon: Marcina Millard, MD;  Location: [facility];  Service: Cardiovascular;  Laterality: N/A;   PROSTATE CRYOABLATION     SPINAL FUSION     STENT PLACEMENT ILIAC ([facility] HX) Left 2014   Left common iliac and left external iliac by Dr. Gilda Crease   VIDEO BRONCHOSCOPY WITH ENDOBRONCHIAL NAVIGATION N/A 05/30/2019   Procedure: VIDEO BRONCHOSCOPY WITH ENDOBRONCHIAL NAVIGATION, DIABETIC;  Surgeon: Vida Rigger, MD;  Location: [facility];  Service: Thoracic;  Laterality: N/A;   VIDEO BRONCHOSCOPY WITH ENDOBRONCHIAL  NAVIGATION N/A 05/28/2020   Procedure: VIDEO BRONCHOSCOPY WITH ENDOBRONCHIAL NAVIGATION;  Surgeon: Vida Rigger, MD;  Location: [facility];  Service: Thoracic;  Laterality: N/A;   VIDEO BRONCHOSCOPY WITH ENDOBRONCHIAL ULTRASOUND N/A 05/30/2019   Procedure: VIDEO BRONCHOSCOPY WITH ENDOBRONCHIAL ULTRASOUND, DIABETIC;  Surgeon: Vida Rigger, MD;  Location: [facility];  Service: Thoracic;  Laterality: N/A;   VIDEO BRONCHOSCOPY WITH ENDOBRONCHIAL ULTRASOUND N/A 05/28/2020   Procedure: VIDEO BRONCHOSCOPY WITH ENDOBRONCHIAL ULTRASOUND;  Surgeon: Vida Rigger, MD;  Location: [facility];  Service: Thoracic;  Laterality: N/A;   HPI:  Pt is a 82 y.o. male with medical history unsure of his Cognitive status but w/ significant of hypertension, hyperlipidemia, Dm2 with CKD stage 3, sick sinus syndrome, chronic CHFrEF, Pafib, Elevated troponin, hx of prostate cancer w Recent admission for cellulitis in 09/2022 apparently presents due to worsening foot swelling and redness.  Note that patient declined Rehab during recent admission and went home instead.  Pt has apparent Cognitive decline, phonations only and yells out often during the afternoons.  PT is holding on him d/t being inappropriate medically at this time.   CXR: Slightly increasing interstitial markings within the left lung,  which may reflect edema or atypical/viral infection.  Head CT: No acute intracranial findings.  2. Chronic microvascular ischemic change and cerebral volume loss.  CT of Chest this admit: Increasing consolidation in the right lower and right upper lobes  when compared with the prior  exam from June of 2023. Increasing  right-sided pleural effusion is noted as well. Persistent volume  loss in fullness in the right hilum is noted.    Assessment / Plan / Recommendation  Clinical Impression   Pt seen for BSE this morning. Pt awake w/ eyes open and turned head to his name called for ~2 seconds. He did not give his name nor follow commands.  phonations only, no verbalizations. Congested breathing at Baseline.  On Devine O2 support 2-3L; afebrile. WBC not elevated.   Pt exhibited SEVERE s/s of oropharyngeal phase dysphagia and sensorimotor deficits impacting his swallowing. He appears at HIGH risk for aspiration/aspiration pneumonia at this time. Pt exhibits apparent Cognitive Decline (currently vs baseline?) -- suspect this is directly impacting safe swallowing and ability to be placed on an oral diet.   During po trials, tsps of Nectar liquids and puree, pt exhibited MOD-SEVERE oral phase deficits c/b reduced labial seal, reduced lingual movements/coordination though gumming behaviors noted, poor awareness of bolus trials overall. A-P transit was not timely. During the pharyngeal swallowing, suspect delayed pharyngeal swallowing, noted decreased hyolaryngeal excursion, and Significant Congested Coughing was noted after ~3 trials each consistency.  No further po trials given d/t concern of aspiration and pulmonary decline.   Recommend strict NPO status w/ f/u w/ objective swallow assessment, MBSS(scheduled today), to determine status of functional swallowing and ability to take a least restrictive oral diet.  NSG/MD updated and agreed. Recommend frequent oral care for hygiene and stimulation of swallowing. Aspiration precautions.  SLP Visit Diagnosis: Dysphagia, oropharyngeal phase (R13.12)    Aspiration Risk  Severe aspiration risk;Risk for inadequate nutrition/hydration    Diet Recommendation   NPO w/ frequent oral care; aspiration precautions.   Medication Administration: Via alternative means    Other  Recommendations Recommended Consults:  (Palliative Care following) Oral Care Recommendations: Oral care QID;Staff/trained caregiver to provide oral care Other Recommendations:  (TBD)    Recommendations for follow up therapy are one component of a multi-disciplinary discharge planning process, led by the attending physician.   Recommendations may be updated based on patient status, additional functional criteria and insurance authorization.  Follow up Recommendations Skilled nursing-short term rehab (<3 hours/day) (TBD)      Assistance Recommended at Discharge  full  Functional Status Assessment Patient has had a recent decline in their functional status and/or demonstrates limited ability to make significant improvements in function in a reasonable and predictable amount of time  Frequency and Duration  (TBD)   (TBD)       Prognosis Prognosis for Safe Diet Advancement: Guarded Barriers to Reach Goals: Cognitive deficits;Language deficits;Time post onset;Severity of deficits;Behavior Barriers/Prognosis Comment: cognitive decline      Swallow Study   General Date of Onset: 10/22/22 HPI: Pt is a 82 y.o. male with medical history unsure of his Cognitive status but w/ significant of hypertension, hyperlipidemia, Dm2 with CKD stage 3, sick sinus syndrome, chronic CHFrEF, Pafib, Elevated troponin, hx of prostate cancer w Recent admission for cellulitis in 09/2022 apparently presents due to worsening foot swelling and redness.  Note that patient declined Rehab during recent admission and went home instead.  Pt has apparent Cognitive decline, phonations only and yells out often during the afternoons.  PT is holding on him d/t being inappropriate medically at this time.   CXR: Slightly increasing interstitial markings within the left lung,  which may reflect edema or atypical/viral infection.  Head CT: No acute intracranial findings.  2. Chronic microvascular ischemic change and  cerebral volume loss.  CT of Chest this admit: Increasing consolidation in the right lower and right upper lobes  when compared with the prior exam from June of 2023. Increasing  right-sided pleural effusion is noted as well. Persistent volume  loss in fullness in the right hilum is noted. Type of Study: Bedside Swallow Evaluation Previous Swallow  Assessment: none Diet Prior to this Study: NPO Temperature Spikes Noted: No (wbc 7.2) Respiratory Status: Nasal cannula (2-3L) History of Recent Intubation: No Behavior/Cognition: Alert;Cooperative;Confused;Requires cueing;Doesn't follow directions (phonations only) Oral Cavity Assessment: Within Functional Limits Oral Care Completed by SLP: Yes Oral Cavity - Dentition: Missing dentition Vision:  (n/a) Self-Feeding Abilities: Total assist Patient Positioning: Upright in bed (full positioning assistance) Baseline Vocal Quality:  (phonations, muttterings) Volitional Cough: Cognitively unable to elicit Volitional Swallow: Unable to elicit    Oral/Motor/Sensory Function Overall Oral Motor/Sensory Function: Generalized oral weakness (no unilateral oral weakness noted)   Ice Chips Ice chips: Not tested   Thin Liquid Thin Liquid: Not tested    Nectar Thick Nectar Thick Liquid: Impaired Presentation: Spoon (6 trials) Oral Phase Impairments: Reduced labial seal;Reduced lingual movement/coordination;Poor awareness of bolus Oral phase functional implications: Prolonged oral transit Pharyngeal Phase Impairments: Suspected delayed Swallow;Decreased hyoid-laryngeal movement;Cough - Delayed (congested)   Honey Thick Honey Thick Liquid: Not tested   Puree Puree: Impaired Presentation: Spoon (fed; 5 trials) Oral Phase Impairments: Reduced labial seal;Reduced lingual movement/coordination;Poor awareness of bolus Oral Phase Functional Implications: Prolonged oral transit;Oral holding Pharyngeal Phase Impairments: Suspected delayed Swallow;Decreased hyoid-laryngeal movement;Cough - Delayed (congested)   Solid     Solid: Not tested         Jerilynn Som, MS, CCC-SLP Speech Language Pathologist Rehab Services; [facility] [phone] (ascom) Sabriya Yono 10/31/2022,11:34 AM

## 2022-10-31 NOTE — Progress Notes (Signed)
[facility] CLINIC CARDIOLOGY CONSULT NOTE       Patient ID: Carlos Levine MRN: [id number] DOB/AGE: 84-Jan-1942 84 y.o.  Admit date: 10/22/2022 Referring Physician Dr. Shonna Chock Primary Physician Dr. Graciela Husbands Primary Cardiologist Dr. Gwen Pounds Reason for Consultation AoCHF  HPI: Carlos Levine. Dargis is an 31yoM with a PMH of HFrEF (20%, global hypo), ?low flow AS (peak grad 24.51mmHg, 2.3m/s peak vel), paroxysmal AF on dose reduced eliquis per pt preference, SSS s/p Medtronic Micra leadless PPM, CKD 3, DM2, PAD s/p L common and ext iliac stents, hx prostate cancer, hx lung cancer s/p XRT who presented to [facility] ED 10/22/22 with worsening left foot swelling and redness at the request of the heart failure clinic.  He was recently admitted from 12/13 - 12/22 for decompensated HFrEF c/b orthostasis and cellulitis of bilateral LE. Cardiology was consulted for assistance with his heart failure. He was diuresed with IV lasix and clinically improved. Echo this admission showed improved EF 35-40%, global hypo, severe 4 chamber dilation (valve eval limited). He underwent LE angiography on 1/5 which revealed extensive calcific plaques of bilateral common femoral arteries. Vascular recommended open endarterectomy + stenting, but procedure canceled due to worsening mentation & respiratory failure requiring bipap and further IV diuresis.  Remains encephalopathic, ongoing GOC discussions.   Interval History:  -Seen at the bedside this morning, he blinks his eyes open to voice but does not follow commands, answer questions or verbally respond to me, or interact at all. -Intermittently coughing/clearing his throat.  On [place] oxygen. -Seen by speech therapy yesterday and this morning, not safe for swallowing.   Review of systems limited by mental status / somnolence    Past Medical History:  Diagnosis Date   Arrhythmia    PAF   Arthritis    Cancer (HCC)    prostate   CHF (congestive heart failure) (HCC)    Chronic kidney  disease    COPD (chronic obstructive pulmonary disease) (HCC)    Coronary artery disease    Diabetes mellitus without complication (HCC)    Dyspnea    with minimal exertion   Dysrhythmia    history of SVT   History of kidney stones    History of radiation therapy    lung   Hyperkalemia 11/19/2017   Hyperlipidemia    Hypertension    Peripheral vascular disease (HCC)    Prostate cancer (HCC)    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   also has history of skin cancer    Past Surgical History:  Procedure Laterality Date   AMPUTATION FINGER / THUMB Right    BACK SURGERY  1975   removed 2 discs and fused others   LOWER EXTREMITY ANGIOGRAPHY Left 10/24/2022   Procedure: Lower Extremity Angiography;  Surgeon: Renford Dills, MD;  Location: [facility] INVASIVE CV LAB;  Service: Cardiovascular;  Laterality: Left;   PACEMAKER LEADLESS INSERTION N/A 07/25/2020   Procedure: PACEMAKER LEADLESS INSERTION;  Surgeon: Marcina Millard, MD;  Location: [facility] INVASIVE CV LAB;  Service: Cardiovascular;  Laterality: N/A;   PROSTATE CRYOABLATION     SPINAL FUSION     STENT PLACEMENT ILIAC ([facility] HX) Left 2014   Left common iliac and left external iliac by Dr. Gilda Crease   VIDEO BRONCHOSCOPY WITH ENDOBRONCHIAL NAVIGATION N/A 05/30/2019   Procedure: VIDEO BRONCHOSCOPY WITH ENDOBRONCHIAL NAVIGATION, DIABETIC;  Surgeon: Vida Rigger, MD;  Location: [facility] ORS;  Service: Thoracic;  Laterality: N/A;   VIDEO BRONCHOSCOPY WITH ENDOBRONCHIAL NAVIGATION N/A 05/28/2020   Procedure: VIDEO BRONCHOSCOPY  WITH ENDOBRONCHIAL NAVIGATION;  Surgeon: Ottie Glazier, MD;  Location: [facility] ORS;  Service: Thoracic;  Laterality: N/A;   VIDEO BRONCHOSCOPY WITH ENDOBRONCHIAL ULTRASOUND N/A 05/30/2019   Procedure: VIDEO BRONCHOSCOPY WITH ENDOBRONCHIAL ULTRASOUND, DIABETIC;  Surgeon: Ottie Glazier, MD;  Location: [facility] ORS;  Service: Thoracic;  Laterality: N/A;   VIDEO BRONCHOSCOPY WITH ENDOBRONCHIAL ULTRASOUND N/A 05/28/2020   Procedure:  VIDEO BRONCHOSCOPY WITH ENDOBRONCHIAL ULTRASOUND;  Surgeon: Ottie Glazier, MD;  Location: [facility] ORS;  Service: Thoracic;  Laterality: N/A;    Medications Prior to Admission  Medication Sig Dispense Refill Last Dose   apixaban (ELIQUIS) 2.5 MG TABS tablet Take 1 tablet (2.5 mg total) by mouth every 12 (twelve) hours. 60 tablet 3 10/22/2022   ascorbic acid (VITAMIN C) 500 MG tablet Take 1 tablet (500 mg total) by mouth 2 (two) times daily. 60 tablet 1 10/22/2022   bacitracin ointment Apply topically 2 (two) times daily. Apply to blisters on legs 120 g 0 10/22/2022   bicalutamide (CASODEX) 50 MG tablet Take 1 tablet (50 mg total) by mouth daily at 12 noon. 90 tablet 3 10/22/2022   dapagliflozin propanediol (FARXIGA) 10 MG TABS tablet Take 1 tablet (10 mg total) by mouth daily. 30 tablet 1 10/22/2022   feeding supplement (ENSURE ENLIVE / ENSURE PLUS) LIQD Take 237 mLs by mouth 3 (three) times daily between meals. 237 mL 12 10/22/2022   ipratropium-albuterol (DUONEB) 0.5-2.5 (3) MG/3ML SOLN Take 3 mLs by nebulization 2 (two) times daily.   10/22/2022   Multiple Vitamin (MULTIVITAMIN WITH MINERALS) TABS tablet Take 1 tablet by mouth daily.   10/22/2022   pravastatin (PRAVACHOL) 80 MG tablet Take 80 mg by mouth at bedtime.    10/21/2022   pregabalin (LYRICA) 50 MG capsule Take 1 capsule (50 mg total) by mouth 2 (two) times daily. 10 capsule 0 10/22/2022   traZODone (DESYREL) 50 MG tablet Take 0.5 tablets (25 mg total) by mouth at bedtime as needed for sleep. 10 tablet 0 10/21/2022   zinc sulfate 220 (50 Zn) MG capsule Take 1 capsule (220 mg total) by mouth daily. 30 capsule 1 10/22/2022   bisacodyl (DULCOLAX) 5 MG EC tablet Take 1 tablet (5 mg total) by mouth daily as needed for moderate constipation. 30 tablet 0 prn at prn   furosemide (LASIX) 20 MG tablet Take 1 tablet (20 mg total) by mouth 2 (two) times daily. 60 tablet 1    midodrine (PROAMATINE) 10 MG tablet Take 1 tablet (10 mg total) by mouth 3 (three) times daily with  meals. (Patient not taking: Reported on 10/22/2022) 90 tablet 1 Not Taking   polyethylene glycol (MIRALAX / GLYCOLAX) 17 g packet Take 17 g by mouth daily. (Patient not taking: Reported on 10/22/2022) 14 each 0 Not Taking   valACYclovir (VALTREX) 500 MG tablet Take 500 mg by mouth 3 (three) times daily as needed. (Patient not taking: Reported on 10/22/2022)   Not Taking    Social History   Socioeconomic History   Marital status: Married    Spouse name: [name]   Number of children: 4   Years of education: Not on file   Highest education level: Not on file  Occupational History   Occupation: Games developer    Comment: retired  Tobacco Use   Smoking status: Former    Years: 30.00    Types: Cigarettes    Quit date: 10/20/2009    Years since quitting: 13.0   Smokeless tobacco: Never  Vaping Use   Vaping Use: Never used  Substance and Sexual Activity   Alcohol use: No   Drug use: No   Sexual activity: Yes    Birth control/protection: None  Other Topics Concern   Not on file  Social History Narrative   Patient lives with wife and occasionally daughter and granddaughter.   Social Determinants of Health   Financial Resource Strain: Low Risk  (04/13/2019)   Overall Financial Resource Strain (CARDIA)    Difficulty of Paying Living Expenses: Not hard at all  Food Insecurity: No Food Insecurity (10/23/2022)   Hunger Vital Sign    Worried About Running Out of Food in the Last Year: Never true    Ran Out of Food in the Last Year: Never true  Transportation Needs: No Transportation Needs (10/23/2022)   PRAPARE - Administrator, Civil Service (Medical): No    Lack of Transportation (Non-Medical): No  Physical Activity: Unknown (04/13/2019)   Exercise Vital Sign    Days of Exercise per Week: 0 days    Minutes of Exercise per Session: Not on file  Stress: Stress Concern Present (04/13/2019)   [employer] of Occupational Health - Occupational Stress Questionnaire    Feeling of Stress :  To some extent  Social Connections: Moderately Isolated (04/13/2019)   Social Connection and Isolation Panel [NHANES]    Frequency of Communication with Friends and Family: More than three times a week    Frequency of Social Gatherings with Friends and Family: Not on file    Attends Religious Services: Never    Active Member of Clubs or Organizations: No    Attends Banker Meetings: Never    Marital Status: Married  Catering manager Violence: Not At Risk (10/23/2022)   Humiliation, Afraid, Rape, and Kick questionnaire    Fear of Current or Ex-Partner: No    Emotionally Abused: No    Physically Abused: No    Sexually Abused: No    Family History  Problem Relation Age of Onset   Lung cancer Father    Pancreatic cancer Father    Brain cancer Mother    Stomach cancer Paternal Aunt    Stomach cancer Paternal Uncle    Cancer Maternal Grandmother    Kidney cancer Maternal Grandfather       Intake/Output Summary (Last 24 hours) at 10/31/2022 1018 Last data filed at 10/31/2022 0328 Gross per 24 hour  Intake 249.13 ml  Output 500 ml  Net -250.87 ml     Vitals:   10/30/22 2024 10/30/22 2341 10/31/22 0355 10/31/22 0827  BP: 105/60 101/66 104/77 126/74  Pulse: 84 67 93 (!) 48  Resp: 20 19 20 20   Temp: 97.6 F (36.4 C) (!) 97.5 F (36.4 C) 97.6 F (36.4 C) 97.8 F (36.6 C)  TempSrc: Oral Oral Axillary Oral  SpO2: 100% 100% 92% 92%  Weight:      Height:        PHYSICAL EXAM General: Elderly and ill-appearing Caucasian male, sitting upright in bed with eyes closed but blinks them open. Moving extremities spontaneously HEENT:  Normocephalic and atraumatic. Neck:  no JVD.  Lungs: coarse respirations anteriorly, decreased breath sounds Heart: irregularly irregular with controlled rate .  Distant heart sounds. normal S1 and S2 with 2/6 systolic murmur best heard at RUSB Abdomen: Mildly distended appearing.  Msk: generalized weakness Extremities: trace dependent edema  in both LE, kerlix dressing on both ankle. erythema resolved Neuro: somnolent, moves all extremities.  Unable to follow commands, does not verbally respond when  asked questions  Psych: unable to assess  Labs: Basic Metabolic Panel: Recent Labs    10/30/22 0313 10/31/22 0538  NA 146* 147*  K 4.0 3.9  CL 106 107  CO2 27 30  GLUCOSE 151* 101*  BUN 82* 70*  CREATININE 1.67* 1.51*  CALCIUM 8.3* 8.5*  MG 2.4  --     Liver Function Tests: No results for input(s): "AST", "ALT", "ALKPHOS", "BILITOT", "PROT", "ALBUMIN" in the last 72 hours.  No results for input(s): "LIPASE", "AMYLASE" in the last 72 hours. CBC: Recent Labs    10/30/22 0313 10/31/22 0538  WBC 7.5 7.2  HGB 11.9* 12.4*  HCT 38.3* 41.4  MCV 98.7 101.2*  PLT 144* 134*    Cardiac Enzymes: Recent Labs    10/28/22 1122  TROPONINIHS 86*    BNP: Recent Labs    10/28/22 1122  BNP 1,345.4*    D-Dimer: No results for input(s): "DDIMER" in the last 72 hours. Hemoglobin A1C: No results for input(s): "HGBA1C" in the last 72 hours. Fasting Lipid Panel: No results for input(s): "CHOL", "HDL", "LDLCALC", "TRIG", "CHOLHDL", "LDLDIRECT" in the last 72 hours. Thyroid Function Tests: No results for input(s): "TSH", "T4TOTAL", "T3FREE", "THYROIDAB" in the last 72 hours.  Invalid input(s): "FREET3" Anemia Panel: No results for input(s): "VITAMINB12", "FOLATE", "FERRITIN", "TIBC", "IRON", "RETICCTPCT" in the last 72 hours.   Radiology: [facility] Chest Port 1 View  Result Date: 10/28/2022 CLINICAL DATA:  Encephalopathy EXAM: PORTABLE CHEST 1 VIEW COMPARISON:  10/22/2022, 10/01/2022 FINDINGS: Loop recorder. Stable cardiomegaly. Aortic atherosclerosis. Chronic post treatment changes within the right hemithorax with volume loss and fibrosis. Moderate right pleural effusion, similar to prior. Slightly increasing interstitial markings within the left lung. No left-sided effusion. No pneumothorax. IMPRESSION: 1. Slightly increasing  interstitial markings within the left lung, which may reflect edema or atypical/viral infection. 2. Otherwise, no significant interval change. Electronically Signed   By: Duanne Guess D.O.   On: 10/28/2022 10:59   CT HEAD WO CONTRAST ( )  Result Date: 10/28/2022 CLINICAL DATA:  Encephalopathy EXAM: CT HEAD WITHOUT CONTRAST TECHNIQUE: Contiguous axial images were obtained from the base of the skull through the vertex without intravenous contrast. RADIATION DOSE REDUCTION: This exam was performed according to the departmental dose-optimization program which includes automated exposure control, adjustment of the mA and/or kV according to patient size and/or use of iterative reconstruction technique. COMPARISON:  06/11/2017, 04/07/2019 FINDINGS: Brain: No evidence of acute infarction, hemorrhage, hydrocephalus, extra-axial collection or mass lesion/mass effect. Moderate low-density changes within the periventricular and subcortical white matter compatible with chronic microvascular ischemic change. Moderate diffuse cerebral volume loss. Vascular: Atherosclerotic calcifications involving the large vessels of the skull base. No unexpected hyperdense vessel. Skull: Normal. Negative for fracture or focal lesion. Sinuses/Orbits: No acute finding. Other: None. IMPRESSION: 1. No acute intracranial findings. 2. Chronic microvascular ischemic change and cerebral volume loss. Electronically Signed   By: Duanne Guess D.O.   On: 10/28/2022 10:53   ECHOCARDIOGRAM COMPLETE  Result Date: 10/25/2022    ECHOCARDIOGRAM REPORT   Patient Name:   MIRANDA GARBER Date of Exam: 10/24/2022 Medical Rec #:  [id number]      Height:       68.0 in Accession #:    [id number]     Weight:       201.5 lb Date of Birth:  [birth year]      BSA:          2.050 m Patient Age:    33 years  BP:           92/72 mmHg Patient Gender: M              HR:           94 bpm. Exam Location:  [facility] Procedure: 2D Echo, Cardiac Doppler and Color Doppler  Indications:     CHF--acute systolic Z61.09  History:         Patient has prior history of Echocardiogram examinations, most                  recent 05/29/2019. CHF, COPD; Risk Factors:Hypertension.  Sonographer:     Sherrie Sport Referring Phys:  [id number] Garfield Edith Lord Diagnosing Phys: Neoma Laming  Sonographer Comments: Technically difficult study due to poor echo windows, no apical window and no parasternal window. Image acquisition challenging due to uncooperative patient. IMPRESSIONS  1. Left ventricular ejection fraction, by estimation, is 35 to 40%. The left ventricle has moderately decreased function. The left ventricle demonstrates global hypokinesis. The left ventricular internal cavity size was severely dilated. There is mild left ventricular hypertrophy. Left ventricular diastolic function could not be evaluated.  2. Right ventricular systolic function is moderately reduced. The right ventricular size is severely enlarged. Mildly increased right ventricular wall thickness.  3. Left atrial size was severely dilated.  4. Right atrial size was severely dilated.  5. The mitral valve was not well visualized. No evidence of mitral valve regurgitation.  6. The aortic valve was not well visualized. Aortic valve regurgitation is not visualized. Aortic valve sclerosis/calcification is present, without any evidence of aortic stenosis.  7. Aortic unable to see. FINDINGS  Left Ventricle: Left ventricular ejection fraction, by estimation, is 35 to 40%. The left ventricle has moderately decreased function. The left ventricle demonstrates global hypokinesis. The left ventricular internal cavity size was severely dilated. There is mild left ventricular hypertrophy. Left ventricular diastolic function could not be evaluated. Right Ventricle: The right ventricular size is severely enlarged. Mildly increased right ventricular wall thickness. Right ventricular systolic function is moderately reduced. Left Atrium: Left atrial  size was severely dilated. Right Atrium: Right atrial size was severely dilated. Pericardium: The pericardium was not assessed. Mitral Valve: The mitral valve was not well visualized. No evidence of mitral valve regurgitation. Tricuspid Valve: The tricuspid valve is not assessed. Tricuspid valve regurgitation is not demonstrated. Aortic Valve: The aortic valve was not well visualized. Aortic valve regurgitation is not visualized. Aortic valve sclerosis/calcification is present, without any evidence of aortic stenosis. Pulmonic Valve: The pulmonic valve was not assessed. Pulmonic valve regurgitation is not visualized. Aorta: Unable to see. IAS/Shunts: The interatrial septum was not assessed.  LEFT VENTRICLE PLAX 2D LVIDd:         4.50 cm LVIDs:         3.70 cm LV PW:         1.20 cm LV IVS:        1.10 cm  LEFT ATRIUM         Index LA diam:    5.10 cm 2.49 cm/m Neoma Laming Electronically signed by Neoma Laming Signature Date/Time: 10/25/2022/9:08:14 AM    Final    PERIPHERAL VASCULAR CATHETERIZATION  Result Date: 10/24/2022 See surgical note for result.  US ARTERIAL LOWER EXTREMITY DUPLEX BILATERAL  Result Date: 10/23/2022 CLINICAL DATA:  82 year old male with a infection EXAM: NONINVASIVE PHYSIOLOGIC VASCULAR STUDY OF BILATERAL LOWER EXTREMITIES TECHNIQUE: Evaluation of both lower extremities was performed at rest, including calculation of ankle-brachial  indices, directed duplex COMPARISON:  None Available. FINDINGS: Right ABI:  Not acquired Left ABI:  Not acquired Right Lower Extremity: Directed duplex of the right lower extremity demonstrates triphasic waveform of the common femoral artery, profunda femoris and proximal SFA. Distal SFA and popliteal artery demonstrate monophasic waveform. Posterior tibial artery and anterior tibial artery monophasic. No elevated velocity identified. Left Lower Extremity: Directed duplex of the left lower extremity demonstrates monophasic postobstructive waveform of the  common femoral artery, profunda femoris, SFA, popliteal artery. Monophasic posterior tibial artery and anterior tibial artery. The imaged portions of the SFA are patent. No elevated velocities. IMPRESSION: Right: Directed duplex of the right lower extremity demonstrates evidence of multifocal SFA disease, contributing to monophasic popliteal and tibial artery waveforms. Left: Directed duplex of the left lower extremity demonstrates occlusive disease of the iliac system, with patency of the left femoropopliteal system, monophasic waveforms throughout. Signed, Yvone Neu. Miachel Roux, RPVI [facility] Radiology Electronically Signed   By: Gilmer Mor D.O.   On: 10/23/2022 14:12   DG Foot 2 Views Left  Result Date: 10/23/2022 CLINICAL DATA:  Left foot blisters and skin breakdown. EXAM: LEFT FOOT - 2 VIEW COMPARISON:  None Available. FINDINGS: No bony destruction or periosteal reaction. No acute fracture or dislocation. Joint spaces are preserved. Bone mineralization is normal. Dorsal foot soft tissue swelling. IMPRESSION: 1. Dorsal foot soft tissue swelling. No acute osseous abnormality. Electronically Signed   By: Obie Dredge M.D.   On: 10/23/2022 11:30   CT CHEST WO CONTRAST  Result Date: 10/23/2022 CLINICAL DATA:  Right-sided pleural effusion EXAM: CT CHEST WITHOUT CONTRAST TECHNIQUE: Multidetector CT imaging of the chest was performed following the standard protocol without IV contrast. RADIATION DOSE REDUCTION: This exam was performed according to the departmental dose-optimization program which includes automated exposure control, adjustment of the mA and/or kV according to patient size and/or use of iterative reconstruction technique. COMPARISON:  CT 04/01/2022, chest x-ray from the previous day. FINDINGS: Cardiovascular: Limited due to lack of IV contrast. Atherosclerotic calcifications are noted. Heavy coronary calcifications are seen. Heart is not  significantly enlarged in size. Lead less pacemaker is noted within the distal aspect of the right ventricle. Mediastinum/Nodes: Thoracic inlet is within normal limits. No hilar or mediastinal adenopathy is noted. The esophagus as visualized is within normal limits. Lungs/Pleura: Mild emphysematous changes are noted in the left lung. No focal infiltrate or sizable effusion is seen. No sizable parenchymal nodules are noted. On the right there is a large pleural effusion which has increased in the interval from the prior exam. Lower lobe consolidation is noted as well as some upper lobe consolidation likely related to prior treatment. Fullness in the right hilum is noted stable from previous exams. Upper Abdomen: Visualized upper abdomen is within normal limits. Musculoskeletal: No rib abnormality is noted. Degenerative changes of thoracic spine are seen. IMPRESSION: Increasing consolidation in the right lower and right upper lobes when compared with the prior exam from June of 2023. Increasing right-sided pleural effusion is noted as well. Persistent volume loss in fullness in the right hilum is noted related to prior therapy. No other focal abnormality is noted. Aortic Atherosclerosis (ICD10-I70.0) and Emphysema (ICD10-J43.9). Electronically Signed   By: Alcide Clever M.D.   On: 10/23/2022 01:47   DG Chest Portable 1 View  Result Date: 10/22/2022 CLINICAL DATA:  Nonhealing left foot wound and shortness of breath. EXAM: PORTABLE CHEST 1 VIEW COMPARISON:  October 01, 2022 FINDINGS: The  cardiac silhouette is mildly enlarged and unchanged in size. There is marked severity calcification of the thoracic aorta. There is stable opacification of the mid right lung and right lung base with subsequent right-sided volume loss. Mild, stable right upper lobe scarring and/or airspace disease is also seen. Chronic appearing increased lung markings are seen throughout the left lung. A stable moderate sized right-sided pleural  effusion is noted. No pneumothorax is identified. Multilevel degenerative changes are seen throughout the thoracic spine. IMPRESSION: 1. Stable post treatment changes throughout the right lung with subsequent right-sided volume loss. Electronically Signed   By: Aram Candela M.D.   On: 10/22/2022 21:08   US Venous Img Lower Bilateral (DVT)  Result Date: 10/01/2022 CLINICAL DATA:  Leg pain and swelling. EXAM: BILATERAL LOWER EXTREMITY VENOUS DOPPLER ULTRASOUND TECHNIQUE: Gray-scale sonography with compression, as well as color and duplex ultrasound, were performed to evaluate the deep venous system(s) from the level of the common femoral vein through the popliteal and proximal calf veins. COMPARISON:  None Available. FINDINGS: VENOUS Normal compressibility of the common femoral, superficial femoral, and popliteal veins, as well as the visualized calf veins. Visualized portions of profunda femoral vein and great saphenous vein unremarkable. No filling defects to suggest DVT on grayscale or color Doppler imaging. Doppler waveforms show normal direction of venous flow, normal respiratory plasticity and response to augmentation. OTHER Subcutaneous soft tissue edema bilateral calf. Limitations: none IMPRESSION: Negative. Electronically Signed   By: Larose Hires D.O.   On: 10/01/2022 22:48   DG Chest 2 View  Result Date: 10/01/2022 CLINICAL DATA:  Leg swelling. EXAM: CHEST - 2 VIEW COMPARISON:  06/29/2022 FINDINGS: Stable significant post treatment changes involving the right hemithorax with loss of volume and changes of radiation fibrosis. Associated right pleural effusion. The left lung remains relatively clear. No pulmonary lesions, overt pulmonary edema or left pleural effusion. IMPRESSION: 1. Stable significant post treatment changes involving the right hemithorax. 2. The left lung remains relatively clear. No left-sided pleural effusion. Electronically Signed   By: Rudie Meyer M.D.   On: 10/01/2022  11:58    ECHO 09/08/2022 ECHOCARDIOGRAPHIC MEASUREMENTS  2D DIMENSIONS  AORTA                  Values   Normal Range   MAIN PA         Values    Normal Range                Annulus: nm*          [2.3-2.9]         PA Main: nm*       [1.5-2.1]              Aorta Sin: nm*          [3.1-3.7]    RIGHT VENTRICLE            ST Junction: nm*          [2.6-3.2]         RV Base: nm*       [<4.2]             Asc.Aorta: nm*          [2.6-3.4]          RV Mid: nm*       [<3.5]  LEFT VENTRICLE  RV Length: nm*       [<8.6]                 LVIDd: 5.0 cm       [4.2-5.9]    INFERIOR VENA CAVA                  LVIDs: 4.7 cm                        Max. IVC: nm*       [<=2.1]                    FS: 5.4 %        [>25]            Min. IVC: nm*                    SWT: 0.83 cm      [0.6-1.0]    ------------------                    PWT: 0.83 cm      [0.6-1.0]    nm* - not measured  LEFT ATRIUM                LA Diam: 4.6 cm       [3.0-4.0]            LA A4C Area: nm*          [<20]             LA Volume: nm*          [18-58]  _________________________________________________________________________________________  ECHOCARDIOGRAPHIC DESCRIPTIONS  AORTIC ROOT                   Size: Normal             Dissection: INDETERM FOR DISSECTION  AORTIC VALVE               Leaflets: Tricuspid                   Morphology: SEVERELY THICKENED               Mobility: PARTIALLY MOBILE  LEFT VENTRICLE                   Size: Normal                        Anterior: HYPOCONTRACTILE            Contraction: Normal                         Lateral: HYPOCONTRACTILE             Closest EF: 20% (Estimated)                 Septal: HYPOCONTRACTILE              LV Masses: No Masses                       Apical: HYPOCONTRACTILE                    LVH: None                          Inferior: HYPOCONTRACTILE  Posterior: HYPOCONTRACTILE            Dias.FxClass: N/A  MITRAL VALVE               Leaflets: Normal                        Mobility: Fully mobile             Morphology: THICKENED LEAFLET(S)  LEFT ATRIUM                   Size: MILDLY ENLARGED              LA Masses: No masses              IA Septum: Normal IAS  MAIN PA                   Size: Normal  PULMONIC VALVE             Morphology: Normal                        Mobility: Fully mobile  RIGHT VENTRICLE              RV Masses: No Masses                         Size: Normal              Free Wall: Normal                     Contraction: Normal  TRICUSPID VALVE               Leaflets: Normal                        Mobility: Fully mobile             Morphology: Normal  RIGHT ATRIUM                   Size: Normal                        RA Other: None                RA Mass: No masses  PERICARDIUM                 Fluid: No effusion  INFERIOR VENACAVA                   Size: Normal Normal respiratory collapse  _________________________________________________________________________________________   DOPPLER ECHO and OTHER SPECIAL PROCEDURES                 Aortic: MILD AR                    SEVERE AS                         249.0 cm/sec peak vel      24.8 mmHg peak grad                         14.0 mmHg mean grad        0.80 cm^2 by DOPPLER                 Mitral:  MILD MR                    No MS                         MV Inflow E Vel = nm*      MV Annulus E'Vel = nm*                         E/E'Ratio = nm*              Tricuspid: MILD TR                    No TS                         313.0 cm/sec peak TR vel   54.2 mmHg peak RV pressure              Pulmonary: MILD PR                    No PS  _________________________________________________________________________________________  INTERPRETATION  SEVERE LV SYSTOLIC DYSFUNCTION (See above)  NORMAL RIGHT VENTRICULAR SYSTOLIC FUNCTION  MILD VALVULAR REGURGITATION (See above)  SEVERE VALVULAR STENOSIS (See  above)  Poor sound wave transmission significantly limited this study - pt with significant AS  and MS  _________________________________________________________________________________________  Electronically signed by      Danella Penton, MD on 09/09/2022 08: 49 PM           Performed By: Rayetta Humphrey, RCS     Ordering Physician: Daniel Nones   TELEMETRY reviewed by me (LT) 10/31/2022 : AF 90s  EKG reviewed by me: Ectopic atrial rhythm with rate 98 bpm  Data reviewed by me (LT) 10/31/2022: hospitalist progress note, vascular surgery note, SLP note, nursing notes, cbc, bmp,   Principal Problem:   Cellulitis in diabetic foot (HCC) Active Problems:   Benign essential hypertension   Stage 3b chronic kidney disease (CKD) (HCC)   Type 2 diabetes mellitus with diabetic chronic kidney disease (HCC)   Pure hypercholesterolemia   Squamous cell lung cancer, right (HCC)   Sick sinus syndrome (HCC)   PAF (paroxysmal atrial fibrillation) (HCC)   Normocytic anemia   Cellulitis   HFrEF (heart failure with reduced ejection fraction) (HCC)   Peripheral edema    ASSESSMENT AND PLAN:  Carlos Levine. Kelm is an 15yoM with a PMH of HFrEF (20%, global hypo), ?low flow AS (peak grad 24.47mmHg, 2.41m/s peak vel), paroxysmal AF on dose reduced eliquis per pt preference, SSS s/p [employer] Micra leadless PPM, CKD 3, DM2, PAD s/p L common and ext iliac stents, hx prostate cancer, hx lung cancer s/p XRT who presented to [facility] ED 10/22/22 with worsening left foot swelling and redness at the request of the heart failure clinic.  He was recently admitted from 12/13 - 12/22 for decompensated HFrEF c/b orthostasis and cellulitis of bilateral LE. Cardiology was consulted for assistance with his heart failure. He was diuresed with IV lasix and clinically improved. Echo this admission showed improved EF 35-40%, global hypo, severe 4 chamber dilation (valve eval limited). He underwent LE angiography on 1/5 which revealed  extensive calcific plaques of bilateral common femoral arteries. Vascular recommended open endarterectomy + stenting, but procedure canceled due to worsening mentation & respiratory failure requiring bipap and further IV diuresis.  Remains encephalopathic, ongoing GOC  discussions.   # acute encephalopathy  Lethargic and minimally responsive the AM of 1/9, rapid response called. ABG with hypercarbia, CXR with increased pulm edema, head CT neg for acute finding. Diuretics were held for a day following initial clinical improvement and worsening Cr, possibly contributory to hypercarbia.  -weaned from bipap and back on [place] oxygen, remains encephalopathic.  -lyrica discontinued   -recommend further GOC conversations / palliative involvement.   # acute on chronic HFrEF (35-40%, prev<20%, global hypo) # dilated CM  # ?low flow AS Presents for his second admission in 1 month with worsening peripheral edema c/b LE cellulitis and nonhealing ulcers. Echo 08/2022 at his PCP's office showed an EF <20%, with mild-mod AS, possibly low flow/gradient with poor LV output. Repeat this admission showed higher EF, global hypo, and assessment of valves technically limited.  BNP 1400 on admission, clinically improved following IV diuresis and transitioned to PO lasix. Diuretics were held d/t increasing Cr. Cxr 1/9 following rapid response with worsening interstitial edema, repeat BNP 1300.  - s/p IV lasix 60 x1 on 1/9 and 40mg  IV x 3 and net neg 2.7L. Not grossly hypervolemic appearing. Defer further diuresis for now.  - GDMT initiation/titration limited by hypotension.  - salt and fluid restriction, strict I/O, daily weights   # LE cellulitis # severe PAD s/p left common and ext iliac stents - on abx per primary team -vascular initially planned for open endarterectomy + stenting. Following rapid response the AM of 1/9 requiring BIPAP,  and ongoing encephalopathy / decline this procedure was postponed/cancelled.   #  paroxysmal AF RVR # SSS s/p Micra PPM  In AF on tele, rate controlled in the 90s to low 100s -unable to tolerate metop -eliquis switched to heparin on 1/7 in anticipation of vascular procedure. On 2.5mg  BID at home, per the patient's preference although he does not meet criteria for this dosing, will need to revisit this   # AKI on CKD 3  Baseline from discharge on 12/22 44/1.16, GFR >60. On admission Cr. 1.44 and GFR 48. Stable now Cr 1.51, GFR 46.   Cardiology will sign off. Please haiku with questions or re-engage if needed.    This patient's plan of care was discussed and created with Dr. 04-12-2004 and he is in agreement.  Signed: Juliann Pares , PA-C 10/31/2022, 10:18 AM [facility]

## 2022-10-31 NOTE — Consult Note (Signed)
Consultation Note Date: 10/31/2022   Patient Name: Carlos Levine  DOB: 1941/06/24  MRN: [id number]  Age / Sex: 82 y.o., male  PCP: Lynnea Ferrier, MD Referring Physician: [name], MD  Reason for Consultation: Establishing goals of care  HPI/Patient Profile: Carlos Levine is a 82 y.o. male with medical history significant of hypertension, hyperlipidemia, Dm2 with CKD stage 3, sick sinus syndrome, chronic CHFrEF, Pafib, Elevated troponin, hx of prostate cancer w recent admission for cellulitis apparently presents due to worsening foot swelling and redness.  Pt was told by cardiologist to come to ED.  Note that patient declined rehab during recent admission and went home.  No medication change since discharge other than couldn't get midodrine at the pharmacy.   Note prior ultrasound 10/01/2022 during recent admission negative for DVT  Clinical Assessment and Goals of Care: Notes and labs reviewed.  Update by attending.  Into see patient, he is unable to participate in goals of care conversation, no family at bedside.  Called to speak to patient's wife.  Wife discusses previous hospitalization, and that with time he had improved from where he was at discharge.  She discusses that he was independent.  Wife states the patient did not want to come to the hospital.    We discussed his diagnosis, prognosis, GOC, EOL wishes disposition and options.  Created space and opportunity for patient  to explore thoughts and feelings regarding current medical information.   A detailed discussion was had today regarding advanced directives.  Concepts specific to code status, artifical feeding and hydration, IV antibiotics and rehospitalization were discussed.  The difference between an aggressive medical intervention path and a comfort care path was discussed.  Values and goals of care important to patient and family were  attempted to be elicited.  Discussed limitations of medical interventions to prolong quality of life in some situations and discussed the concept of human mortality.  We discussed pain and suffering.  Wife has been well updated and is able to articulate her husband's status and issues.  We discussed his wishes moving forward.  We discussed the need for a feeding tube, and the care involved with a feeding tube.  She states she will speak to her family about this.  She states she is hopeful that his status will improve so that he can have endarterectomy of his legs.  She states that she is hopeful based on her conversation with vascular surgery.  She states he would not want amputation of his legs.  She states that he and she are people of faith and trust in God's will.   I discussed that I would follow-up Tuesday on my return.   SUMMARY OF RECOMMENDATIONS   Wife considering a feeding tube.  Confirmed DNR status.  Wife is considering other boundaries to care but does state he would not want amputations of his legs.  Discussed that I would follow-up on Tuesday.  Prognosis:  Poor overall      Primary Diagnoses: Present on Admission:  Cellulitis in diabetic foot (HCC)  Benign essential hypertension  Stage 3b chronic kidney disease (CKD) (HCC)  Type 2 diabetes mellitus with diabetic chronic kidney disease (HCC)  Pure hypercholesterolemia  PAF (paroxysmal atrial fibrillation) (HCC)  Normocytic anemia  Cellulitis  Squamous cell lung cancer, right (HCC)  Sick sinus syndrome (HCC)   I have reviewed the medical record, interviewed the patient and family, and examined the patient. The following aspects are pertinent.  Past Medical History:  Diagnosis Date   Arrhythmia    PAF   Arthritis    Cancer (HCC)    prostate   CHF (congestive heart failure) (HCC)    Chronic kidney disease    COPD (chronic obstructive pulmonary disease) (HCC)    Coronary artery disease    Diabetes mellitus  without complication (HCC)    Dyspnea    with minimal exertion   Dysrhythmia    history of SVT   History of kidney stones    History of radiation therapy    lung   Hyperkalemia 11/19/2017   Hyperlipidemia    Hypertension    Peripheral vascular disease (HCC)    Prostate cancer (HCC)    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   also has history of skin cancer   Social History   Socioeconomic History   Marital status: Married    Spouse name: [name]   Number of children: 4   Years of education: Not on file   Highest education level: Not on file  Occupational History   Occupation: Music therapist    Comment: retired  Tobacco Use   Smoking status: Former    Years: 30.00    Types: Cigarettes    Quit date: 10/20/2009    Years since quitting: 13.0   Smokeless tobacco: Never  Vaping Use   Vaping Use: Never used  Substance and Sexual Activity   Alcohol use: No   Drug use: No   Sexual activity: Yes    Birth control/protection: None  Other Topics Concern   Not on file  Social History Narrative   Patient lives with wife and occasionally daughter and granddaughter.   Social Determinants of Health   Financial Resource Strain: Low Risk  (04/13/2019)   Overall Financial Resource Strain (CARDIA)    Difficulty of Paying Living Expenses: Not hard at all  Food Insecurity: No Food Insecurity (10/23/2022)   Hunger Vital Sign    Worried About Running Out of Food in the Last Year: Never true    Ran Out of Food in the Last Year: Never true  Transportation Needs: No Transportation Needs (10/23/2022)   PRAPARE - Administrator, Civil Service (Medical): No    Lack of Transportation (Non-Medical): No  Physical Activity: Unknown (04/13/2019)   Exercise Vital Sign    Days of Exercise per Week: 0 days    Minutes of Exercise per Session: Not on file  Stress: Stress Concern Present (04/13/2019)   [employer] of Occupational Health - Occupational Stress Questionnaire    Feeling of Stress  : To some extent  Social Connections: Moderately Isolated (04/13/2019)   Social Connection and Isolation Panel [NHANES]    Frequency of Communication with Friends and Family: More than three times a week    Frequency of Social Gatherings with Friends and Family: Not on file    Attends Religious Services: Never    Database administrator or Organizations: No    Attends Banker Meetings: Never    Marital Status:  Married   Family History  Problem Relation Age of Onset   Lung cancer Father    Pancreatic cancer Father    Brain cancer Mother    Stomach cancer Paternal Aunt    Stomach cancer Paternal Uncle    Cancer Maternal Grandmother    Kidney cancer Maternal Grandfather    Scheduled Meds:  vitamin C  500 mg Oral BID   enoxaparin (LOVENOX) injection  1.5 mg/kg Subcutaneous Daily   feeding supplement  237 mL Oral TID BM   insulin aspart  0-15 Units Subcutaneous TID WC   insulin aspart  0-5 Units Subcutaneous QHS   multivitamin  1 tablet Oral Daily   polyethylene glycol  17 g Oral Daily   pravastatin  80 mg Oral QHS   senna  1 tablet Oral Daily   sodium chloride flush  3 mL Intravenous Q12H   Continuous Infusions:  sodium chloride     dextrose 40 mL/hr at 10/31/22 0923   PRN Meds:.sodium chloride, acetaminophen **OR** acetaminophen, ipratropium-albuterol, ondansetron (ZOFRAN) IV, oxyCODONE, sodium chloride flush Medications Prior to Admission:  Prior to Admission medications   Medication Sig Start Date End Date Taking? Authorizing Provider  apixaban (ELIQUIS) 2.5 MG TABS tablet Take 1 tablet (2.5 mg total) by mouth every 12 (twelve) hours. 10/10/22  Yes Hollice Espy, MD  ascorbic acid (VITAMIN C) 500 MG tablet Take 1 tablet (500 mg total) by mouth 2 (two) times daily. 10/10/22  Yes Hollice Espy, MD  bacitracin ointment Apply topically 2 (two) times daily. Apply to blisters on legs 10/10/22  Yes Hollice Espy, MD  bicalutamide (CASODEX) 50 MG tablet  Take 1 tablet (50 mg total) by mouth daily at 12 noon. 11/28/21  Yes Stoioff, Verna Czech, MD  dapagliflozin propanediol (FARXIGA) 10 MG TABS tablet Take 1 tablet (10 mg total) by mouth daily. 10/10/22  Yes Hollice Espy, MD  feeding supplement (ENSURE ENLIVE / ENSURE PLUS) LIQD Take 237 mLs by mouth 3 (three) times daily between meals. 10/10/22  Yes Hollice Espy, MD  ipratropium-albuterol (DUONEB) 0.5-2.5 (3) MG/3ML SOLN Take 3 mLs by nebulization 2 (two) times daily. 09/24/20  Yes [provider]  Multiple Vitamin (MULTIVITAMIN WITH MINERALS) TABS tablet Take 1 tablet by mouth daily. 10/10/22  Yes Hollice Espy, MD  pravastatin (PRAVACHOL) 80 MG tablet Take 80 mg by mouth at bedtime.  01/02/19  Yes [provider]  pregabalin (LYRICA) 50 MG capsule Take 1 capsule (50 mg total) by mouth 2 (two) times daily. 10/10/22  Yes Hollice Espy, MD  traZODone (DESYREL) 50 MG tablet Take 0.5 tablets (25 mg total) by mouth at bedtime as needed for sleep. 10/10/22  Yes Hollice Espy, MD  zinc sulfate 220 (50 Zn) MG capsule Take 1 capsule (220 mg total) by mouth daily. 10/10/22  Yes Hollice Espy, MD  bisacodyl (DULCOLAX) 5 MG EC tablet Take 1 tablet (5 mg total) by mouth daily as needed for moderate constipation. 10/10/22   Hollice Espy, MD  furosemide (LASIX) 20 MG tablet Take 1 tablet (20 mg total) by mouth 2 (two) times daily. 10/10/22   Hollice Espy, MD  midodrine (PROAMATINE) 10 MG tablet Take 1 tablet (10 mg total) by mouth 3 (three) times daily with meals. Patient not taking: Reported on 10/22/2022 10/10/22   Hollice Espy, MD  polyethylene glycol (MIRALAX / GLYCOLAX) 17 g packet Take 17 g by mouth daily. Patient not taking: Reported on 10/22/2022  10/10/22   Hollice Espy, MD  valACYclovir (VALTREX) 500 MG tablet Take 500 mg by mouth 3 (three) times daily as needed. Patient not taking: Reported on 10/22/2022 06/13/22   [provider]    Allergies  Allergen Reactions   Contrast Media [Iodinated Contrast Media] Hives and Rash   Ace Inhibitors Other (See Comments)    Unknown reaction type   Latex Rash   Review of Systems  Unable to perform ROS   Physical Exam Pulmonary:     Effort: Pulmonary effort is normal.  Neurological:     Mental Status: He is alert.     Vital Signs: BP 114/68 (BP Location: Right Arm)   Pulse 90   Temp 97.6 F (36.4 C) (Oral)   Resp 18   Ht 5\' 8"  (1.727 m)   Wt 93.7 kg   SpO2 100%   BMI 31.41 kg/m  Pain Scale: PAINAD   Pain Score: Asleep   SpO2: SpO2: 100 % O2 Device:SpO2: 100 % O2 Flow Rate: .O2 Flow Rate (L/min): 2 L/min  IO: Intake/output summary:  Intake/Output Summary (Last 24 hours) at 10/31/2022 1645 Last data filed at 10/31/2022 1037 Gross per 24 hour  Intake 249.13 ml  Output 300 ml  Net -50.87 ml    LBM: Last BM Date : 10/24/22 Baseline Weight: Weight: 91.4 kg Most recent weight: Weight: 93.7 kg         Signed by: 12/23/22, NP   Please contact Palliative Medicine Team phone at [phone] for questions and concerns.  For individual provider: See [phone]

## 2022-10-31 NOTE — Progress Notes (Signed)
Pt blood glucose 98. Checked glucose due to pt being NPO  , current orders are for achs. Provider B. Jon Billings aware.

## 2022-11-01 DIAGNOSIS — L03119 Cellulitis of unspecified part of limb: Secondary | ICD-10-CM | POA: Diagnosis not present

## 2022-11-01 DIAGNOSIS — E11628 Type 2 diabetes mellitus with other skin complications: Secondary | ICD-10-CM | POA: Diagnosis not present

## 2022-11-01 LAB — BASIC METABOLIC PANEL
Anion gap: 8 (ref 5–15)
BUN: 64 mg/dL — ABNORMAL HIGH (ref 8–23)
CO2: 30 mmol/L (ref 22–32)
Calcium: 8.6 mg/dL — ABNORMAL LOW (ref 8.9–10.3)
Chloride: 109 mmol/L (ref 98–111)
Creatinine, Ser: 1.42 mg/dL — ABNORMAL HIGH (ref 0.61–1.24)
GFR, Estimated: 49 mL/min — ABNORMAL LOW (ref >60)
Glucose, Bld: 159 mg/dL — ABNORMAL HIGH (ref 70–99)
Potassium: 4 mmol/L (ref 3.5–5.1)
Sodium: 147 mmol/L — ABNORMAL HIGH (ref 135–145)

## 2022-11-01 LAB — CBC
HCT: 39.6 % (ref 39.0–52.0)
Hemoglobin: 12 g/dL — ABNORMAL LOW (ref 13.0–17.0)
MCH: 30.8 pg (ref 26.0–34.0)
MCHC: 30.3 g/dL (ref 30.0–36.0)
MCV: 101.5 fL — ABNORMAL HIGH (ref 80.0–100.0)
Platelets: 120 10*3/uL — ABNORMAL LOW (ref 150–400)
RBC: 3.9 MIL/uL — ABNORMAL LOW (ref 4.22–5.81)
RDW: 17.3 % — ABNORMAL HIGH (ref 11.5–15.5)
WBC: 6.7 10*3/uL (ref 4.0–10.5)
nRBC: 0 % (ref 0.0–0.2)

## 2022-11-01 LAB — GLUCOSE, CAPILLARY
Glucose-Capillary: 160 mg/dL — ABNORMAL HIGH (ref 70–99)
Glucose-Capillary: 165 mg/dL — ABNORMAL HIGH (ref 70–99)
Glucose-Capillary: 155 mg/dL — ABNORMAL HIGH (ref 70–99)
Glucose-Capillary: 140 mg/dL — ABNORMAL HIGH (ref 70–99)

## 2022-11-01 MED ORDER — CHLORHEXIDINE GLUCONATE 4 % EX LIQD: 60.0000 mL | Freq: Once | CUTANEOUS | Status: DC

## 2022-11-01 MED ORDER — HYDROMORPHONE HCL 1 MG/ML IJ SOLN: 1.0000 mg | Freq: Once | INTRAMUSCULAR | Status: DC | PRN

## 2022-11-01 MED ORDER — MORPHINE SULFATE (PF) 2 MG/ML IV SOLN: 0.5000 mg | Freq: Four times a day (QID) | INTRAVENOUS | Status: DC | PRN

## 2022-11-01 MED ORDER — SODIUM CHLORIDE 0.9 % IV SOLN: INTRAVENOUS | Status: DC

## 2022-11-01 MED ORDER — ONDANSETRON HCL 4 MG/2ML IJ SOLN: 4.0000 mg | Freq: Four times a day (QID) | INTRAMUSCULAR | Status: DC | PRN

## 2022-11-01 MED ORDER — DIPHENHYDRAMINE HCL 50 MG/ML IJ SOLN: 50.0000 mg | Freq: Once | INTRAMUSCULAR | Status: DC | PRN

## 2022-11-01 MED ORDER — FAMOTIDINE 20 MG PO TABS: 40.0000 mg | ORAL_TABLET | Freq: Once | ORAL | Status: DC | PRN

## 2022-11-01 MED ORDER — FENTANYL CITRATE PF 50 MCG/ML IJ SOSY: 12.5000 ug | PREFILLED_SYRINGE | Freq: Once | INTRAMUSCULAR | Status: DC | PRN

## 2022-11-01 MED ORDER — CEFAZOLIN SODIUM-DEXTROSE 2-4 GM/100ML-% IV SOLN
2.0000 g | INTRAVENOUS | Status: DC
  Filled 2022-11-01: qty 100

## 2022-11-01 MED ORDER — METHYLPREDNISOLONE SODIUM SUCC 125 MG IJ SOLR: 125.0000 mg | Freq: Once | INTRAMUSCULAR | Status: DC | PRN

## 2022-11-01 MED ORDER — ORAL CARE MOUTH RINSE: 15.0000 mL | OROMUCOSAL | Status: DC | PRN

## 2022-11-01 MED ORDER — MIDAZOLAM HCL 2 MG/ML PO SYRP: 8.0000 mg | ORAL_SOLUTION | Freq: Once | ORAL | Status: DC | PRN

## 2022-11-01 NOTE — Progress Notes (Signed)
Physical Therapy Treatment Patient Details Name: Carlos Levine MRN: [id number] DOB: 16-Nov-1940 Today's Date: 11/01/2022   History of Present Illness Carlos Levine is a 82 y.o. male with medical history significant of hypertension, hyperlipidemia, Dm2 with CKD stage 3, sick sinus syndrome, chronic CHFrEF, Pafib, Elevated troponin, hx of prostate cancer w recent admission for cellulitis apparently presents due to worsening foot swelling and redness.  Pt was told by cardiologist to come to ED.  Note that patient declined rehab during recent admission and went home.  Note prior ultrasound 10/01/2022 during recent admission negative for DVT. Rapid response called 1/9 for unresponsiveness and transferred to progressive care.    PT Comments    Pt tolerated treatment fair today. Pt able to demonstrate improvements in command following, assist levels, lethargy, and seated balance. Required max assist for trunk and BLE facilitation to perform supine<>sit transfers. Able to maintain static seated balance with CGA for safety, but quickly progressed to "poor" balance with fatigue and posterior lean. Assisted with pericare and linen change in supine, requiring min assist for bil rolling. Pt with increased erythema, tenderness, and skin breakdown along sacrum above mepilex; RN notified. While pt demonstrates improvements in command following and alertness, he continues to require 1-step commands with max step by step directions with multimodal cues. While pt is making progress, he is limited with meeting goals secondary to confusion, impaired processing, lethargy, generalized weakness, decreased activity tolerance, and decreased gross balance. Pt will continue to benefit from skilled acute PT services to address deficits for return to baseline function. Will continue to recommend SNF at DC.     Recommendations for follow up therapy are one component of a multi-disciplinary discharge planning process, led by the  attending physician.  Recommendations may be updated based on patient status, additional functional criteria and insurance authorization.  Follow Up Recommendations  Skilled nursing-short term rehab (<3 hours/day) Can patient physically be transported by private vehicle: No   Assistance Recommended at Discharge Frequent or constant Supervision/Assistance  Patient can return home with the following Two people to help with walking and/or transfers;Two people to help with bathing/dressing/bathroom;Assistance with cooking/housework;Assistance with feeding;Direct supervision/assist for medications management;Direct supervision/assist for financial management;Assist for transportation;Help with stairs or ramp for entrance   Equipment Recommendations  Hospital bed;Wheelchair cushion (measurements PT);Wheelchair (measurements PT)       Precautions / Restrictions Precautions Precautions: Fall Restrictions Weight Bearing Restrictions: No     Mobility  Bed Mobility Overal bed mobility: Needs Assistance Bed Mobility: Rolling, Supine to Sit, Sit to Supine Rolling: Mod assist   Supine to sit: Max assist, HOB elevated Sit to supine: Max assist   General bed mobility comments: mod assist for bil rolling for pericare and linen change; max assist for BLE and trunk facilitation with for supine<>sit transfer    Transfers                   General transfer comment: deferred due to perseveration of pulling on lines/leads and increased lethargy with time       Balance Overall balance assessment: Needs assistance Sitting-balance support: Feet supported, Bilateral upper extremity supported Sitting balance-Leahy Scale: Fair Sitting balance - Comments: initially poor, progressing to fair. Returning to poor with fatigue       Standing balance comment: deferred due to increased lethargy and assist with time  Cognition Arousal/Alertness:  Lethargic Behavior During Therapy: Flat affect Overall Cognitive Status: Difficult to assess                                 General Comments: intermittently follows 1-step commands, but requires hand over hand cues for attention to task and processing. Grossly non-verbal throughout session. Increased alertness with visual stimuli and while sitting EOB.        Exercises Other Exercises Other Exercises: Participates in bed mobility and seated balance at EOB. Assisted with pericare and linen change. Increased erythema, tenderness, and skin breakdown noted at sacrum, below mepilex;  RN notified. Other Exercises: Pt educated re: PT role/POC and safety with mobility.    General Comments General comments (skin integrity, edema, etc.): mitten restraints removed during session but donned again after perseveration on pulling on pulse ox line. Assisted with pericare and linen change in supine.      Pertinent Vitals/Pain Pain Assessment Pain Assessment: Faces Faces Pain Scale: Hurts little more Pain Location: legs Pain Intervention(s): Monitored during session, Repositioned     PT Goals (current goals can now be found in the care plan section) Acute Rehab PT Goals Patient Stated Goal: unable to state PT Goal Formulation: Patient unable to participate in goal setting Time For Goal Achievement: 11/08/22 Potential to Achieve Goals: Poor Progress towards PT goals: Progressing toward goals    Frequency    Min 2X/week      PT Plan Current plan remains appropriate       AM-PAC PT "6 Clicks" Mobility   Outcome Measure  Help needed turning from your back to your side while in a flat bed without using bedrails?: A Lot Help needed moving from lying on your back to sitting on the side of a flat bed without using bedrails?: A Lot Help needed moving to and from a bed to a chair (including a wheelchair)?: Total Help needed standing up from a chair using your arms (e.g.,  wheelchair or bedside chair)?: Total Help needed to walk in hospital room?: Total Help needed climbing 3-5 steps with a railing? : Total 6 Click Score: 8    End of Session Equipment Utilized During Treatment: Oxygen Activity Tolerance: Patient limited by lethargy Patient left: in bed;with call bell/phone within reach;with bed alarm set Nurse Communication: Mobility status PT Visit Diagnosis: Difficulty in walking, not elsewhere classified (R26.2);Muscle weakness (generalized) (M62.81);Unsteadiness on feet (R26.81)     Time: 8519-0347 PT Time Calculation (min) (ACUTE ONLY): 25 min  Charges:  $Therapeutic Activity: 23-37 mins                      Vira Blanco, PT, [name] 11:40 AM,11/01/22 Physical Therapist - [facility]

## 2022-11-01 NOTE — Plan of Care (Signed)

## 2022-11-01 NOTE — Progress Notes (Signed)
[facility]  - [place] at [facility]   PATIENT NAME: Carlos Levine    MR#:  [id number]  DATE OF BIRTH:  1941/02/02  SUBJECTIVE:  No family at bedside patient still confused. Repeated 'Good morning' after me Not able to hold a meaningful conversation NPO due to high risk for aspiration and pocketing food in mouth per RN/ST  VITALS:  Blood pressure 113/80, pulse 93, temperature 97.7 F (36.5 C), temperature source Oral, resp. rate 19, height 5\' 8"  (1.727 m), weight 93.7 kg, SpO2 100 %.  PHYSICAL EXAMINATION:   GENERAL:  82 y.o.-year-old patient lying in the bed with no acute distress. Confused on [place] oxygen LUNGS: decreased breath sounds bilaterally, no wheezing CARDIOVASCULAR: S1, S2 normal. No murmur   ABDOMEN: Soft, nontender, nondistended. EXTREMITIES:   NEUROLOGIC: unable to assess. Moves extremities spontaneously   LABORATORY PANEL:  CBC Recent Labs  Lab 11/01/22 0530  WBC 6.7  HGB 12.0*  HCT 39.6  PLT 120*     Chemistries  Recent Labs  Lab 10/30/22 0313 10/31/22 0538 11/01/22 0530  NA 146*   < > 147*  K 4.0   < > 4.0  CL 106   < > 109  CO2 27   < > 30  GLUCOSE 151*   < > 159*  BUN 82*   < > 64*  CREATININE 1.67*   < > 1.42*  CALCIUM 8.3*   < > 8.6*  MG 2.4  --   --    < > = values in this interval not displayed.    Cardiac Enzymes No results for input(s): "TROPONINI" in the last 168 hours. RADIOLOGY:  DG Swallowing Func-Speech Pathology  Result Date: 10/31/2022 Table formatting from the original result was not included. Objective Swallowing Evaluation: Type of Study: MBS-Modified Barium Swallow Study  Patient Details Name: WOLF BOULAY MRN: Neldon [place] Date of Birth: 1941/08/24 Today's Date: 10/31/2022 Time: SLP Start Time (ACUTE ONLY): 1430 -SLP Stop Time (ACUTE ONLY): 1450 SLP Time Calculation (min) (ACUTE ONLY): 20 min Past Medical History: Past Medical History: Diagnosis Date  Arrhythmia   PAF  Arthritis   Cancer (HCC)   prostate  CHF  (congestive heart failure) (HCC)   Chronic kidney disease   COPD (chronic obstructive pulmonary disease) (HCC)   Coronary artery disease   Diabetes mellitus without complication (HCC)   Dyspnea   with minimal exertion  Dysrhythmia   history of SVT  History of kidney stones   History of radiation therapy   lung  Hyperkalemia 11/19/2017  Hyperlipidemia   Hypertension   Peripheral vascular disease (HCC)   Prostate cancer (HCC)   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  also has history of skin cancer Past Surgical History: Past Surgical History: Procedure Laterality Date  AMPUTATION FINGER / THUMB Right   BACK SURGERY  1975  removed 2 discs and fused others  LOWER EXTREMITY ANGIOGRAPHY Left 10/24/2022  Procedure: Lower Extremity Angiography;  Surgeon: 12/23/2022, MD;  Location: [facility] INVASIVE CV LAB;  Service: Cardiovascular;  Laterality: Left;  PACEMAKER LEADLESS INSERTION N/A 07/25/2020  Procedure: PACEMAKER LEADLESS INSERTION;  Surgeon: 09/24/2020, MD;  Location: [facility] INVASIVE CV LAB;  Service: Cardiovascular;  Laterality: N/A;  PROSTATE CRYOABLATION    SPINAL FUSION    STENT PLACEMENT ILIAC ([facility] HX) Left 2014  Left common iliac and left external iliac by Dr. Marcina Millard  VIDEO BRONCHOSCOPY WITH ENDOBRONCHIAL NAVIGATION N/A 05/30/2019  Procedure: VIDEO BRONCHOSCOPY WITH ENDOBRONCHIAL NAVIGATION, DIABETIC;  Surgeon: 07/30/2019,  MD;  Location: [facility] ORS;  Service: Thoracic;  Laterality: N/A;  VIDEO BRONCHOSCOPY WITH ENDOBRONCHIAL NAVIGATION N/A 05/28/2020  Procedure: VIDEO BRONCHOSCOPY WITH ENDOBRONCHIAL NAVIGATION;  Surgeon: Ottie Glazier, MD;  Location: [facility] ORS;  Service: Thoracic;  Laterality: N/A;  VIDEO BRONCHOSCOPY WITH ENDOBRONCHIAL ULTRASOUND N/A 05/30/2019  Procedure: VIDEO BRONCHOSCOPY WITH ENDOBRONCHIAL ULTRASOUND, DIABETIC;  Surgeon: Ottie Glazier, MD;  Location: [facility] ORS;  Service: Thoracic;  Laterality: N/A;  VIDEO BRONCHOSCOPY WITH ENDOBRONCHIAL ULTRASOUND N/A 05/28/2020  Procedure: VIDEO  BRONCHOSCOPY WITH ENDOBRONCHIAL ULTRASOUND;  Surgeon: Ottie Glazier, MD;  Location: [facility] ORS;  Service: Thoracic;  Laterality: N/A; HPI: Pt is a 82 y.o. male with medical history unsure of his Cognitive status but w/ significant of hypertension, hyperlipidemia, Dm2 with CKD stage 3, sick sinus syndrome, chronic CHFrEF, Pafib, Elevated troponin, hx of prostate cancer w Recent admission for cellulitis in 09/2022 apparently presents due to worsening foot swelling and redness.  Note that patient declined Rehab during recent admission and went home instead.  Pt has apparent Cognitive decline, phonations only and yells out often during the afternoons.  PT is holding on him d/t being inappropriate medically at this time.   CXR: Slightly increasing interstitial markings within the left lung,  which may reflect edema or atypical/viral infection.  Head CT: No acute intracranial findings.  2. Chronic microvascular ischemic change and cerebral volume loss.  CT of Chest this admit: Increasing consolidation in the right lower and right upper lobes  when compared with the prior exam from June of 2023. Increasing  right-sided pleural effusion is noted as well. Persistent volume  loss in fullness in the right hilum is noted.  Subjective: Pt required maximal stimulation for inital arousal  Recommendations for follow up therapy are one component of a multi-disciplinary discharge planning process, led by the attending physician.  Recommendations may be updated based on patient status, additional functional criteria and insurance authorization. Assessment / Plan / Recommendation   10/31/2022   9:00 PM Clinical Impressions Clinical Impression Pt presents with cognition based severe oral phase dysphagia that is c/b boluses escaping out of pt's mouth, oral holding without any response to multi-modal assistance to swallow. Pt able to produce 1 swallow with difficulty fulling observing pt's swallow d/t pt's inability to maintain his  position in chair. Study determined d/t pt's increased frustration and inability to consistently produce a swallow. At this time, pt's prognosis for sustaining nutrition and hydration with PO intake are considered poor. Given pt's current cognitive state, he is unable to participate in skilled ST services. Recommend continued NPO. MD made aware of these results. SLP Visit Diagnosis Dysphagia, oropharyngeal phase (R13.12) Impact on safety and function Severe aspiration risk;Risk for inadequate nutrition/hydration     10/31/2022   9:00 PM Treatment Recommendations Treatment Recommendations No treatment recommended at this time     10/31/2022   9:00 PM Prognosis Prognosis for Safe Diet Advancement -- Barriers to Reach Goals Cognitive deficits;Language deficits;Time post onset;Severity of deficits;Behavior Barriers/Prognosis Comment cognitive decline   10/31/2022   9:00 PM Diet Recommendations SLP Diet Recommendations NPO Medication Administration Via alternative means     10/31/2022   9:00 PM Other Recommendations Oral Care Recommendations Oral care QID Follow Up Recommendations -- Functional Status Assessment Patient has had a recent decline in their functional status and/or demonstrates limited ability to make significant improvements in function in a reasonable and predictable amount of time   10/31/2022  10:00 AM Frequency and Duration  Speech Therapy Frequency (ACUTE ONLY) -- Treatment Duration --  10/31/2022   9:00 PM Oral Phase Oral Phase Impaired    10/31/2022   9:00 PM Pharyngeal Phase Pharyngeal Phase Impaired    10/31/2022   9:00 PM Cervical Esophageal Phase  Cervical Esophageal Phase [facility] Happi Overton 10/31/2022, 10:00 PM                      Assessment and Plan ANIRUDH BAIZ is a 82 y.o. male with medical history significant of hypertension, hyperlipidemia, Dm2 with CKD stage 3, sick sinus syndrome, chronic CHFrEF, Pafib, Elevated troponin, hx of prostate cancer w recent admission for cellulitis apparently  presents due to worsening foot swelling and redness.  Pt was told by cardiologist to come to ED.     Acute metabolic encephalopathy suspected due to hypercarbic respiratory failure with acidosis ?cognitive decline -- patient was placed on BiPAP yesterday currently on 2 L nasal cannula oxygen. Mentation is still waxing -- PRN nebs -- IV Lasix BID good diuresis.  --hold further lasix due to sodium trending up and pt appears dry clincally --d/c Lyrica/sedating meds --still remains confused --CT head--moderate atrophy  Diabetic foot infection severe peripheral arterial disease chronic lower extremity edema -- continue IV cefepime (known to cause mental status changes)-- d/w ID  RPH--completed 7 days of IV abxs --d/c for now and monitor --wbc normal, no fever  Severe PAD -- vascular surgery consultation appreciated -- angiograms show severe bilateral lower extremity disease -- vascular recommending bilateral femoral endarterectomy with standing of SFA and above knee popliteal artery. -- Lengthy surgery will require patient's cardiac and pulmonary status be stable -- continue IV heparin drip --spoke with pt's wife [name] on the phone--she understands overall a poor prognosis. Will await d/w Dr Gilda Crease later today. --Dr Gilda Crease d/w family (wife and dter0 on 10/30/22--and at present cont dressing changes. He thinks pt likely will not do good with either endarterectomy or bilateral amputation.  Severe cardiomyopathy -- EF currently 35 to 40% with global hypokinesis -- received few IV Lasix, monitor input output and metabolic panel --Clinically appears a bit dry--Holding lasix today -- [facility] cardiology input appreciated --hold Home farxiga   A-fib, paroxysmal HR currently controlled - holding apixaban, started heparin 1/7, in anticipation of possible vascular surgery --will change to Lovenox for now till pt able to take po meds safely (d/w RPH)    CAD - cont home statin   T2DM Glucose  mild elevation - continue SSI   Peripheral Neuropathy - cont home pregabalin   CKD 3b Kidney function at baseline though cr rising a bit --cont IVF   H/o Sick sinus Has pacemaker   h/o  Lung cancer In remission, surveilled by oncology  Nutrition Status: Nutrition Problem: Moderate Malnutrition Etiology: chronic illness (COPD, CHF, prostate cancer, lung cancer) Signs/Symptoms: moderate fat depletion, severe fat depletion, moderate muscle depletion, severe muscle depletion  PEr ST--pt did NOT do well. Significant cognitive decline and at high risk for aspiration. Keep NPO    palliative care consultation appreciated.  Pt's wife to d/w her son  Procedures: angiogram LE Family communication :wife on the phone 1/12 Consults :cardiology, Vascular CODE STATUS: DNR DVT Prophylaxis lovenox Level of care: Progressive Status is: Inpatient Remains inpatient appropriate because: AMS,Resp failure, severe PAD    TOTAL TIME TAKING CARE OF THIS PATIENT: 35 minutes.  >50% time spent on counselling and coordination of care  Note: This dictation was prepared with Dragon dictation along with smaller phrase technology. Any transcriptional errors that result from this process  are unintentional.  [name] M.D    [facility]   CC: Primary care physician; Lynnea Ferrier, MD[facility]  - [place] at [facility]

## 2022-11-02 DIAGNOSIS — N1832 Chronic kidney disease, stage 3b: Secondary | ICD-10-CM

## 2022-11-02 DIAGNOSIS — E43 Unspecified severe protein-calorie malnutrition: Secondary | ICD-10-CM | POA: Diagnosis not present

## 2022-11-02 DIAGNOSIS — I480 Paroxysmal atrial fibrillation: Secondary | ICD-10-CM | POA: Diagnosis not present

## 2022-11-02 DIAGNOSIS — I739 Peripheral vascular disease, unspecified: Secondary | ICD-10-CM

## 2022-11-02 LAB — CULTURE, BLOOD (ROUTINE X 2)
Culture: NO GROWTH
Culture: NO GROWTH
Special Requests: ADEQUATE
Special Requests: ADEQUATE

## 2022-11-02 LAB — GLUCOSE, CAPILLARY
Glucose-Capillary: 149 mg/dL — ABNORMAL HIGH (ref 70–99)
Glucose-Capillary: 130 mg/dL — ABNORMAL HIGH (ref 70–99)
Glucose-Capillary: 111 mg/dL — ABNORMAL HIGH (ref 70–99)
Glucose-Capillary: 148 mg/dL — ABNORMAL HIGH (ref 70–99)

## 2022-11-02 MED ORDER — MORPHINE SULFATE (PF) 2 MG/ML IV SOLN: 0.5000 mg | Freq: Three times a day (TID) | INTRAVENOUS | Status: DC | PRN

## 2022-11-02 NOTE — Progress Notes (Addendum)
[facility]  - [place] at [facility]   PATIENT NAME: Carlos Levine    MR#:  [id number]  DATE OF BIRTH:  [birth year]  SUBJECTIVE:  No family at bedside patient still confused.  Not able to hold a meaningful conversation NPO due to high risk for aspiration and pocketing food in mouth per RN/ST  VITALS:  Blood pressure 111/69, pulse 89, temperature 97.7 F (36.5 C), temperature source Oral, resp. rate 16, height 5\' 8"  (1.727 m), weight 93.7 kg, SpO2 98 %.  PHYSICAL EXAMINATION:   GENERAL:  82 y.o.-year-old patient lying in the bed with no acute distress. Confused on [place] oxygen LUNGS: decreased breath sounds bilaterally, no wheezing CARDIOVASCULAR: S1, S2 normal. No murmur   ABDOMEN: Soft, nontender, nondistended. EXTREMITIES:   NEUROLOGIC: unable to assess. Moves extremities spontaneously Dependent edema+  LABORATORY PANEL:  CBC Recent Labs  Lab 11/01/22 0530  WBC 6.7  HGB 12.0*  HCT 39.6  PLT 120*     Chemistries  Recent Labs  Lab 10/30/22 0313 10/31/22 0538 11/01/22 0530  NA 146*   < > 147*  K 4.0   < > 4.0  CL 106   < > 109  CO2 27   < > 30  GLUCOSE 151*   < > 159*  BUN 82*   < > 64*  CREATININE 1.67*   < > 1.42*  CALCIUM 8.3*   < > 8.6*  MG 2.4  --   --    < > = values in this interval not displayed.    Cardiac Enzymes No results for input(s): "TROPONINI" in the last 168 hours. RADIOLOGY:  DG Swallowing Func-Speech Pathology  Result Date: 10/31/2022 Table formatting from the original result was not included. Objective Swallowing Evaluation: Type of Study: MBS-Modified Barium Swallow Study  Patient Details Name: NAZIM KADLEC MRN: Neldon Newport Date of Birth: 01/19/1941 Today's Date: 10/31/2022 Time: SLP Start Time (ACUTE ONLY): 1430 -SLP Stop Time (ACUTE ONLY): 1450 SLP Time Calculation (min) (ACUTE ONLY): 20 min Past Medical History: Past Medical History: Diagnosis Date  Arrhythmia   PAF  Arthritis   Cancer (HCC)   prostate  CHF (congestive  heart failure) (HCC)   Chronic kidney disease   COPD (chronic obstructive pulmonary disease) (HCC)   Coronary artery disease   Diabetes mellitus without complication (HCC)   Dyspnea   with minimal exertion  Dysrhythmia   history of SVT  History of kidney stones   History of radiation therapy   lung  Hyperkalemia 11/19/2017  Hyperlipidemia   Hypertension   Peripheral vascular disease (HCC)   Prostate cancer (HCC)   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  also has history of skin cancer Past Surgical History: Past Surgical History: Procedure Laterality Date  AMPUTATION FINGER / THUMB Right   BACK SURGERY  1975  removed 2 discs and fused others  LOWER EXTREMITY ANGIOGRAPHY Left 10/24/2022  Procedure: Lower Extremity Angiography;  Surgeon: 12/23/2022, MD;  Location: [facility] INVASIVE CV LAB;  Service: Cardiovascular;  Laterality: Left;  PACEMAKER LEADLESS INSERTION N/A 07/25/2020  Procedure: PACEMAKER LEADLESS INSERTION;  Surgeon: 09/24/2020, MD;  Location: [facility] INVASIVE CV LAB;  Service: Cardiovascular;  Laterality: N/A;  PROSTATE CRYOABLATION    SPINAL FUSION    STENT PLACEMENT ILIAC ([facility] HX) Left 2014  Left common iliac and left external iliac by Dr. Marcina Millard  VIDEO BRONCHOSCOPY WITH ENDOBRONCHIAL NAVIGATION N/A 05/30/2019  Procedure: VIDEO BRONCHOSCOPY WITH ENDOBRONCHIAL NAVIGATION, DIABETIC;  Surgeon: 07/30/2019, MD;  Location:  [facility] ORS;  Service: Thoracic;  Laterality: N/A;  VIDEO BRONCHOSCOPY WITH ENDOBRONCHIAL NAVIGATION N/A 05/28/2020  Procedure: VIDEO BRONCHOSCOPY WITH ENDOBRONCHIAL NAVIGATION;  Surgeon: Vida Rigger, MD;  Location: [facility] ORS;  Service: Thoracic;  Laterality: N/A;  VIDEO BRONCHOSCOPY WITH ENDOBRONCHIAL ULTRASOUND N/A 05/30/2019  Procedure: VIDEO BRONCHOSCOPY WITH ENDOBRONCHIAL ULTRASOUND, DIABETIC;  Surgeon: Vida Rigger, MD;  Location: [facility] ORS;  Service: Thoracic;  Laterality: N/A;  VIDEO BRONCHOSCOPY WITH ENDOBRONCHIAL ULTRASOUND N/A 05/28/2020  Procedure: VIDEO BRONCHOSCOPY  WITH ENDOBRONCHIAL ULTRASOUND;  Surgeon: Vida Rigger, MD;  Location: [facility] ORS;  Service: Thoracic;  Laterality: N/A; HPI: Pt is a 82 y.o. male with medical history unsure of his Cognitive status but w/ significant of hypertension, hyperlipidemia, Dm2 with CKD stage 3, sick sinus syndrome, chronic CHFrEF, Pafib, Elevated troponin, hx of prostate cancer w Recent admission for cellulitis in 09/2022 apparently presents due to worsening foot swelling and redness.  Note that patient declined Rehab during recent admission and went home instead.  Pt has apparent Cognitive decline, phonations only and yells out often during the afternoons.  PT is holding on him d/t being inappropriate medically at this time.   CXR: Slightly increasing interstitial markings within the left lung,  which may reflect edema or atypical/viral infection.  Head CT: No acute intracranial findings.  2. Chronic microvascular ischemic change and cerebral volume loss.  CT of Chest this admit: Increasing consolidation in the right lower and right upper lobes  when compared with the prior exam from June of 2023. Increasing  right-sided pleural effusion is noted as well. Persistent volume  loss in fullness in the right hilum is noted.  Subjective: Pt required maximal stimulation for inital arousal  Recommendations for follow up therapy are one component of a multi-disciplinary discharge planning process, led by the attending physician.  Recommendations may be updated based on patient status, additional functional criteria and insurance authorization. Assessment / Plan / Recommendation   10/31/2022   9:00 PM Clinical Impressions Clinical Impression Pt presents with cognition based severe oral phase dysphagia that is c/b boluses escaping out of pt's mouth, oral holding without any response to multi-modal assistance to swallow. Pt able to produce 1 swallow with difficulty fulling observing pt's swallow d/t pt's inability to maintain his position in chair.  Study determined d/t pt's increased frustration and inability to consistently produce a swallow. At this time, pt's prognosis for sustaining nutrition and hydration with PO intake are considered poor. Given pt's current cognitive state, he is unable to participate in skilled ST services. Recommend continued NPO. MD made aware of these results. SLP Visit Diagnosis Dysphagia, oropharyngeal phase (R13.12) Impact on safety and function Severe aspiration risk;Risk for inadequate nutrition/hydration     10/31/2022   9:00 PM Treatment Recommendations Treatment Recommendations No treatment recommended at this time     10/31/2022   9:00 PM Prognosis Prognosis for Safe Diet Advancement -- Barriers to Reach Goals Cognitive deficits;Language deficits;Time post onset;Severity of deficits;Behavior Barriers/Prognosis Comment cognitive decline   10/31/2022   9:00 PM Diet Recommendations SLP Diet Recommendations NPO Medication Administration Via alternative means     10/31/2022   9:00 PM Other Recommendations Oral Care Recommendations Oral care QID Follow Up Recommendations -- Functional Status Assessment Patient has had a recent decline in their functional status and/or demonstrates limited ability to make significant improvements in function in a reasonable and predictable amount of time   10/31/2022  10:00 AM Frequency and Duration  Speech Therapy Frequency (ACUTE ONLY) -- Treatment Duration --  10/31/2022   9:00 PM Oral Phase Oral Phase Impaired    10/31/2022   9:00 PM Pharyngeal Phase Pharyngeal Phase Impaired    10/31/2022   9:00 PM Cervical Esophageal Phase  Cervical Esophageal Phase [facility] Happi Overton 10/31/2022, 10:00 PM                      Assessment and Plan WYNDELL CARDIFF is a 82 y.o. male with medical history significant of hypertension, hyperlipidemia, Dm2 with CKD stage 3, sick sinus syndrome, chronic CHFrEF, Pafib, Elevated troponin, hx of prostate cancer w recent admission for cellulitis apparently presents due to  worsening foot swelling and redness.  Pt was told by cardiologist to come to ED.     Acute metabolic encephalopathy suspected due to hypercarbic respiratory failure with acidosis ?cognitive decline -- patient was placed on BiPAP transiently  --currently on 2 L nasal cannula oxygen. Mentation is still waxing -- PRN nebs -- did received IV Lasix BID with good diuresis.  --hold further lasix due to sodium trending up and pt appears dry clincally --d/c Lyrica/sedating meds --still remains confused --CT head--moderate atrophy  Diabetic foot infection severe peripheral arterial disease chronic lower extremity edema -- continue IV cefepime (known to cause mental status changes)-- d/w ID  RPH--completed 7 days of IV abxs --d/c for now and monitor --wbc normal, no fever  Severe PAD -- vascular surgery consultation appreciated -- angiograms show severe bilateral lower extremity disease -- vascular recommending bilateral femoral endarterectomy with standing of SFA and above knee popliteal artery. -- Lengthy surgery will require patient's cardiac and pulmonary status be stable --Dr Gilda Crease d/w family (wife and dter0 on 10/30/22--and at present cont dressing changes. He thinks pt likely will not do good with either endarterectomy or bilateral amputation. --cont dressing changes   Severe cardiomyopathy -- EF currently 35 to 40% with global hypokinesis -- received few IV Lasix, monitor input output and metabolic panel --Clinically appears a bit dry--Holding lasix today -- [facility] cardiology input appreciated --hold Home farxiga   A-fib, paroxysmal HR currently controlled - holding apixaban, started heparin 1/7, in anticipation of possible vascular surgery --will change to Lovenox for now till pt able to take po meds safely (d/w RPH)    CAD - cont home statin   T2DM Glucose mild elevation - continue SSI   Peripheral Neuropathy - cont home pregabalin   CKD 3b Kidney function at baseline  though cr rising a bit --cont IVF   H/o Sick sinus Has pacemaker   h/o  Lung cancer In remission, surveilled by oncology  Nutrition Status: Nutrition Problem: Moderate Malnutrition Etiology: chronic illness (COPD, CHF, prostate cancer, lung cancer) Signs/Symptoms: moderate fat depletion, severe fat depletion, moderate muscle depletion, severe muscle depletion  PEr ST--pt did NOT do well. Significant cognitive decline and at high risk for aspiration. Keep NPO --1/14--spoke with wife betty and she wishes to proceed with NG tube feeding. She understands the risks and complications and also that this is a temporary mode of feeding. --will have IR place Dobhoff in am    palliative care consultation appreciated.   Addendum: spoke with son Lorin Picket Bastyr at bedside and discussed of above  Procedures: angiogram LE Family communication :wife on the phone 1/14 Consults :cardiology, Vascular CODE STATUS: DNR DVT Prophylaxis lovenox Level of care: Progressive Status is: Inpatient Remains inpatient appropriate because: AMS,Resp failure, severe PAD, malnutrition    TOTAL TIME TAKING CARE OF THIS PATIENT: 35 minutes.  >50% time spent on  counselling and coordination of care  Note: This dictation was prepared with Dragon dictation along with smaller phrase technology. Any transcriptional errors that result from this process are unintentional.  [name] M.D    [facility]   CC: Primary care physician; Lynnea Ferrier, MD[facility]  - [place] at [facility]

## 2022-11-02 NOTE — Progress Notes (Signed)
Physical Therapy Discharge Patient Details Name: WINSTON MISNER MRN: [id number] DOB: 10/07/1941 Today's Date: 11/02/2022 Time:  -     Patient discharged from PT services secondary to MD orders  Please see latest therapy progress note for current level of functioning and progress toward goals.     GP     Danielle Dess 11/02/2022, 11:55 AM

## 2022-11-02 NOTE — Progress Notes (Signed)
NUTRITION NOTE RD working remotely.  Patient was last assessed by a RD on 1/10. Patient remains NPO. Consult for TF initiation and management received. Able to communicate with RN via secure chat who shares that plan is for small bore NGT placement tomorrow, 1/15, as patient is high risk of pulling out tube.  Will defer ordering TF until 1/15.    Trenton Gammon, MS, RD, LDN, CNSC Clinical Dietitian PRN/Relief staff On-call/weekend pager # available in [facility]

## 2022-11-03 ENCOUNTER — Inpatient Hospital Stay: Payer: Medicare Other

## 2022-11-03 DIAGNOSIS — E43 Unspecified severe protein-calorie malnutrition: Secondary | ICD-10-CM | POA: Diagnosis not present

## 2022-11-03 DIAGNOSIS — I739 Peripheral vascular disease, unspecified: Secondary | ICD-10-CM | POA: Diagnosis not present

## 2022-11-03 DIAGNOSIS — I480 Paroxysmal atrial fibrillation: Secondary | ICD-10-CM | POA: Diagnosis not present

## 2022-11-03 DIAGNOSIS — N1832 Chronic kidney disease, stage 3b: Secondary | ICD-10-CM | POA: Diagnosis not present

## 2022-11-03 LAB — MAGNESIUM
Magnesium: 2.2 mg/dL (ref 1.7–2.4)
Magnesium: 2.3 mg/dL (ref 1.7–2.4)

## 2022-11-03 LAB — GLUCOSE, CAPILLARY
Glucose-Capillary: 167 mg/dL — ABNORMAL HIGH (ref 70–99)
Glucose-Capillary: 163 mg/dL — ABNORMAL HIGH (ref 70–99)
Glucose-Capillary: 130 mg/dL — ABNORMAL HIGH (ref 70–99)
Glucose-Capillary: 76 mg/dL (ref 70–99)
Glucose-Capillary: 107 mg/dL — ABNORMAL HIGH (ref 70–99)

## 2022-11-03 LAB — PHOSPHORUS
Phosphorus: 3.1 mg/dL (ref 2.5–4.6)
Phosphorus: 2.9 mg/dL (ref 2.5–4.6)

## 2022-11-03 MED ORDER — DEXTROSE 10 % IV SOLN: INTRAVENOUS | Status: DC

## 2022-11-03 MED ORDER — MINERAL OIL RE ENEM
1.0000 | ENEMA | Freq: Once | RECTAL | Status: AC
  Administered 2022-11-03: 1 via RECTAL

## 2022-11-03 MED ORDER — PREGABALIN 50 MG PO CAPS
50.0000 mg | ORAL_CAPSULE | Freq: Two times a day (BID) | ORAL | Status: DC
  Administered 2022-11-04 – 2022-11-07 (×8): 50 mg
  Filled 2022-11-03 (×10): qty 1

## 2022-11-03 MED ORDER — VITAL HIGH PROTEIN PO LIQD: 1000.0000 mL | ORAL | Status: DC

## 2022-11-03 MED ORDER — VITAMIN C 500 MG PO TABS
500.0000 mg | ORAL_TABLET | Freq: Two times a day (BID) | ORAL | Status: DC
  Administered 2022-11-04 – 2022-11-06 (×6): 500 mg
  Filled 2022-11-03 (×6): qty 1

## 2022-11-03 MED ORDER — POLYETHYLENE GLYCOL 3350 17 G PO PACK
17.0000 g | PACK | Freq: Every day | ORAL | Status: DC
  Administered 2022-11-04 – 2022-11-06 (×3): 17 g
  Filled 2022-11-03 (×3): qty 1

## 2022-11-03 MED ORDER — OSMOLITE 1.5 CAL PO LIQD
1000.0000 mL | ORAL | Status: DC
  Administered 2022-11-03: 1000 mL
  Administered 2022-11-04: 30 mL
  Administered 2022-11-05 (×2): 1000 mL

## 2022-11-03 MED ORDER — APIXABAN 5 MG PO TABS
5.0000 mg | ORAL_TABLET | Freq: Two times a day (BID) | ORAL | Status: DC
  Administered 2022-11-04 – 2022-11-07 (×8): 5 mg
  Filled 2022-11-03 (×9): qty 1

## 2022-11-03 MED ORDER — FREE WATER
165.0000 mL | Status: DC
  Administered 2022-11-04 (×3): 165 mL

## 2022-11-03 MED ORDER — ADULT MULTIVITAMIN W/MINERALS CH
1.0000 | ORAL_TABLET | Freq: Every day | ORAL | Status: DC
  Administered 2022-11-03 – 2022-11-06 (×4): 1
  Filled 2022-11-03 (×4): qty 1

## 2022-11-03 MED ORDER — PROSOURCE TF20 ENFIT COMPATIBL EN LIQD
60.0000 mL | Freq: Every day | ENTERAL | Status: DC
  Administered 2022-11-03 – 2022-11-07 (×5): 60 mL
  Filled 2022-11-03 (×4): qty 60

## 2022-11-03 NOTE — Progress Notes (Addendum)
Nutrition Follow-up  DOCUMENTATION CODES:   Severe malnutrition in context of chronic illness  INTERVENTION:   -TF via NGT:   Initiate Osmolite 1.5 @ 20 ml/hr and increase by 10 ml every 12 hours to goal rate of 50 ml/hr.    60 ml Prostat daily   165 ml free water flush every 4 hours   Tube feeding regimen provides 1880 kcal (100% of needs), 95 grams of protein, and 914 ml of H2O.  Total free water: 1904 ml daily  -Monitor Mg, K, and Phos and replete as needed secondary to high refeeding risk\  -500 mg vitamin BID via tube -MVI with minerals daily via tube  NUTRITION DIAGNOSIS:   Severe Malnutrition related to chronic illness (COPD, CHF, prostate cancer, lung cancer) as evidenced by moderate fat depletion, severe fat depletion, moderate muscle depletion, severe muscle depletion.  Ongoing  GOAL:   Patient will meet greater than or equal to 90% of their needs  Progressing   MONITOR:   Diet advancement, TF tolerance  REASON FOR ASSESSMENT:   Consult Assessment of nutrition requirement/status  ASSESSMENT:   82 y/o male with h/o CHF, COPD, DM, HLD, HTN, CAD, pacemaker, prostate cancer, SCLC s/p XRT, CKD III and blisters on bilateral feet who is admitted with foot infection and cellulitis.  1/12- s/p BSE- recommend NPO; s/p MBSS- NPO 1/15- NGT placed; tip of tube confirmed in gastric body  Reviewed I/O's: +1.1 L x 24 hours and -1.3 L since admission  UOP: 550 ml x 24 hours   Per vascular surgery, pt is not a surgical candidate at this time.   Pt remains NPO. Noted pt with NGT placed in rt nare. Pt awoke briefly when RD called his name, but unable to awaken enough to interact with this RD. No family at bedside.   Case discussed with MD; plan to start TF today.   Reviewed wt hx; no wt loss noted since admission, however, suspect edema may be masking true weight loss.   Palliative care following for goals of care discussions.   Medications reviewed and include  miralax and dextrose 10% infusion @ 40 ml/hr.   Labs reviewed: CBGS: 111-167 (inpatient orders for glycemic control are 0-15 units insulin aspart TID with meals and 0-5 units insulin aspart daily at bedtime).    NUTRITION - FOCUSED PHYSICAL EXAM:  Flowsheet Row Most Recent Value  Orbital Region Severe depletion  Upper Arm Region Severe depletion  Thoracic and Lumbar Region Mild depletion  Buccal Region Severe depletion  Temple Region Severe depletion  Clavicle Bone Region Severe depletion  Clavicle and Acromion Bone Region Severe depletion  Scapular Bone Region Severe depletion  Dorsal Hand Unable to assess  Patellar Region Mild depletion  Anterior Thigh Region Mild depletion  Posterior Calf Region Mild depletion  Edema (RD Assessment) Mild  Hair Reviewed  Eyes Reviewed  Mouth Reviewed  Skin Reviewed  Nails Reviewed       Diet Order:   Diet Order             Diet NPO time specified  Diet effective now                   EDUCATION NEEDS:   Not appropriate for education at this time  Skin:  Skin Assessment: Reviewed RN Assessment (R foot blister, L foot wound)  Last BM:  11/01/22  Height:   Ht Readings from Last 1 Encounters:  10/22/22 5\' 8"  (1.727 m)    Weight:  Wt Readings from Last 1 Encounters:  10/28/22 93.7 kg    Ideal Body Weight:  70 kg  BMI:  Body mass index is 31.41 kg/m.  Estimated Nutritional Needs:   Kcal:  1900-2200kcal/day  Protein:  95-110g/day  Fluid:  > 1.9 L    Levada Schilling, RD, LDN, CDCES Registered Dietitian II Certified Diabetes Care and Education Specialist Please refer to [facility] for RD and/or RD on-call/weekend/after hours pager

## 2022-11-03 NOTE — Progress Notes (Signed)
Wife called - very anxious about pt disposition re: discharge vs SNF.  Asks for MD to call on 16th.

## 2022-11-03 NOTE — Progress Notes (Signed)
Patient's CBG 76. Per on-call provider, restarted D10 @ 40 mL until NG tube can be restarted.

## 2022-11-03 NOTE — Progress Notes (Addendum)
[facility]  - [place] at [facility]   PATIENT NAME: Carlos Levine    MR#:  [id number]  DATE OF BIRTH:  03-Jan-1941  SUBJECTIVE:  No family at bedside in the morning patient still confused.  Not able to hold a meaningful conversation Per wife--pt spoke with his son and dter yday  VITALS:  Blood pressure 112/81, pulse 89, temperature 97.7 F (36.5 C), temperature source Oral, resp. rate (!) 22, height 5\' 8"  (1.727 m), weight 93.7 kg, SpO2 99 %.  PHYSICAL EXAMINATION:   GENERAL:  82 y.o.-year-old patient lying in the bed with no acute distress. Confused on [place] oxygen LUNGS: decreased breath sounds bilaterally, no wheezing CARDIOVASCULAR: S1, S2 normal. No murmur   ABDOMEN: Soft, nontender, nondistended. EXTREMITIES:   NEUROLOGIC: unable to assess. Moves extremities spontaneously Dependent edema+  LABORATORY PANEL:  CBC Recent Labs  Lab 11/01/22 0530  WBC 6.7  HGB 12.0*  HCT 39.6  PLT 120*     Chemistries  Recent Labs  Lab 11/01/22 0530 11/03/22 1122  NA 147*  --   K 4.0  --   CL 109  --   CO2 30  --   GLUCOSE 159*  --   BUN 64*  --   CREATININE 1.42*  --   CALCIUM 8.6*  --   MG  --  2.2    Cardiac Enzymes No results for input(s): "TROPONINI" in the last 168 hours. RADIOLOGY:  DG Abd 1 View  Result Date: 11/03/2022 CLINICAL DATA:  Feeding tube placement EXAM: ABDOMEN - 1 VIEW COMPARISON:  PET-CT 12/20/2021 FINDINGS: A feeding tube is noted with tip in the stomach body. Hazy opacity at the right lung base, some of which may be from pleural effusion. Thoracolumbar spondylosis and degenerative loss of intervertebral disc height. Gallstones noted. No dilated upper abdominal bowel. Contrast medium in the vicinity of the cecum. IMPRESSION: 1. Feeding tube tip: Stomach body. 2. Cholelithiasis. 3. Hazy opacity at the right lung base, some of which may be from pleural effusion. 4. Thoracolumbar spondylosis and degenerative loss of intervertebral disc  height. Electronically Signed   By: 02/19/2022 M.D.   On: 11/03/2022 09:26    Assessment and Plan MARIAH HARN is a 82 y.o. male with medical history significant of hypertension, hyperlipidemia, Dm2 with CKD stage 3, sick sinus syndrome, chronic CHFrEF, Pafib, Elevated troponin, hx of prostate cancer w recent admission for cellulitis apparently presents due to worsening foot swelling and redness.  Pt was told by cardiologist to come to ED.     Acute metabolic encephalopathy suspected due to hypercarbic respiratory failure with acidosis ?cognitive decline -- patient was placed on BiPAP transiently  --currently on 2 L nasal cannula oxygen. Mentation is still waxing -- PRN nebs -- did received IV Lasix BID with good diuresis.  --hold further lasix due to sodium trending up and pt appears dry clincally --d/c Lyrica/sedating meds --still remains confused --CT head--moderate atrophy  Diabetic foot infection severe peripheral arterial disease chronic lower extremity edema -- IV cefepime (known to cause mental status changes)-- d/w ID  RPH--completed 7 days of IV abxs --d/c for now and monitor --wbc normal, no fever  Severe PAD -- vascular surgery consultation appreciated -- angiograms show severe bilateral lower extremity disease -- vascular recommending bilateral femoral endarterectomy with standing of SFA and above knee popliteal artery. -- Lengthy surgery will require patient's cardiac and pulmonary status be stable --Dr 91 d/w family (wife and dter0 on 10/30/22--and at present cont  dressing changes. He thinks pt likely will not do good with either endarterectomy or bilateral amputation. --cont dressing changes   Severe cardiomyopathy -- EF currently 35 to 40% with global hypokinesis -- received few IV Lasix, monitor input output and metabolic panel --Clinically appears a bit dry--Holding lasix  -- [facility] cardiology input appreciated--signed off --hold Home farxiga    A-fib, paroxysmal HR currently controlled - holding apixaban, started heparin 1/7, in anticipation of possible vascular surgery --will change to Lovenox for now till pt able to take po meds safely (d/w RPH) --1/15--now that pt has NG will give eliquis thru  NG    CAD - cont home statin   T2DM - continue SSI --holding farxiga   Peripheral Neuropathy - cont home pregabalin via NG   CKD 3b Kidney function at baseline though cr rising a bit --cont IVF   H/o Sick sinus Has pacemaker   h/o  Lung cancer H/o Prostate cancer In remission, surveilled by oncology Casodex held since per [facility] not crushable  Nutrition Status: Nutrition Problem: Severe Malnutrition Etiology: chronic illness (COPD, CHF, prostate cancer, lung cancer) Signs/Symptoms: moderate fat depletion, severe fat depletion, moderate muscle depletion, severe muscle depletion  PEr ST--pt did NOT do well with MBSS and ST eval due to cognitive decline and at high risk for aspiration. Keep NPO --1/14--spoke with wife betty and she wishes to proceed with NG tube feeding. She understands the risks and complications and also that this is a temporary mode of feeding. --1/15--s/p Dobhoff placement at bedside -- start NG feeding   palliative care consultation appreciated.   1/14--Addendum: spoke with son Lorin Picket Costanzo at bedside and discussed of above  Procedures: angiogram LE Family communication :wife on the phone 1/15 Consults :cardiology, Vascular CODE STATUS: DNR DVT Prophylaxis lovenox Level of care: Progressive Status is: Inpatient Remains inpatient appropriate because: AMS,Resp failure, severe PAD, malnutrition    TOTAL TIME TAKING CARE OF THIS PATIENT: 35 minutes.  >50% time spent on counselling and coordination of care  Note: This dictation was prepared with Dragon dictation along with smaller phrase technology. Any transcriptional errors that result from this process are unintentional.  [name] M.D     [facility]   CC: Primary care physician; Lynnea Ferrier, MDTri[facility] Hospital[facility]  - [place] at [facility]

## 2022-11-04 ENCOUNTER — Inpatient Hospital Stay: Payer: Medicare Other

## 2022-11-04 DIAGNOSIS — E43 Unspecified severe protein-calorie malnutrition: Secondary | ICD-10-CM | POA: Diagnosis not present

## 2022-11-04 DIAGNOSIS — Z7189 Other specified counseling: Secondary | ICD-10-CM | POA: Diagnosis not present

## 2022-11-04 DIAGNOSIS — N1832 Chronic kidney disease, stage 3b: Secondary | ICD-10-CM | POA: Diagnosis not present

## 2022-11-04 DIAGNOSIS — E11628 Type 2 diabetes mellitus with other skin complications: Secondary | ICD-10-CM | POA: Diagnosis not present

## 2022-11-04 DIAGNOSIS — I480 Paroxysmal atrial fibrillation: Secondary | ICD-10-CM | POA: Diagnosis not present

## 2022-11-04 DIAGNOSIS — L03119 Cellulitis of unspecified part of limb: Secondary | ICD-10-CM | POA: Diagnosis not present

## 2022-11-04 DIAGNOSIS — I739 Peripheral vascular disease, unspecified: Secondary | ICD-10-CM | POA: Diagnosis not present

## 2022-11-04 LAB — GLUCOSE, CAPILLARY
Glucose-Capillary: 118 mg/dL — ABNORMAL HIGH (ref 70–99)
Glucose-Capillary: 138 mg/dL — ABNORMAL HIGH (ref 70–99)
Glucose-Capillary: 140 mg/dL — ABNORMAL HIGH (ref 70–99)
Glucose-Capillary: 145 mg/dL — ABNORMAL HIGH (ref 70–99)
Glucose-Capillary: 155 mg/dL — ABNORMAL HIGH (ref 70–99)
Glucose-Capillary: 221 mg/dL — ABNORMAL HIGH (ref 70–99)
Glucose-Capillary: 533 mg/dL (ref 70–99)

## 2022-11-04 LAB — PHOSPHORUS
Phosphorus: 3.6 mg/dL (ref 2.5–4.6)
Phosphorus: 3.7 mg/dL (ref 2.5–4.6)

## 2022-11-04 LAB — MAGNESIUM
Magnesium: 2.4 mg/dL (ref 1.7–2.4)
Magnesium: 2.4 mg/dL (ref 1.7–2.4)

## 2022-11-04 LAB — BASIC METABOLIC PANEL
Anion gap: 6 (ref 5–15)
BUN: 55 mg/dL — ABNORMAL HIGH (ref 8–23)
CO2: 31 mmol/L (ref 22–32)
Calcium: 8.2 mg/dL — ABNORMAL LOW (ref 8.9–10.3)
Chloride: 110 mmol/L (ref 98–111)
Creatinine, Ser: 1.23 mg/dL (ref 0.61–1.24)
GFR, Estimated: 58 mL/min — ABNORMAL LOW (ref >60)
Glucose, Bld: 151 mg/dL — ABNORMAL HIGH (ref 70–99)
Potassium: 4.1 mmol/L (ref 3.5–5.1)
Sodium: 147 mmol/L — ABNORMAL HIGH (ref 135–145)

## 2022-11-04 MED ORDER — INSULIN ASPART 100 UNIT/ML IJ SOLN
0.0000 [IU] | INTRAMUSCULAR | Status: DC
  Administered 2022-11-04: 1 [IU] via SUBCUTANEOUS
  Administered 2022-11-04: 3 [IU] via SUBCUTANEOUS
  Administered 2022-11-04: 1 [IU] via SUBCUTANEOUS
  Administered 2022-11-04: 2 [IU] via SUBCUTANEOUS
  Administered 2022-11-04: 1 [IU] via SUBCUTANEOUS
  Administered 2022-11-05 (×2): 3 [IU] via SUBCUTANEOUS
  Administered 2022-11-05: 2 [IU] via SUBCUTANEOUS
  Administered 2022-11-05 – 2022-11-06 (×3): 1 [IU] via SUBCUTANEOUS
  Administered 2022-11-06: 9 [IU] via SUBCUTANEOUS
  Administered 2022-11-06: 1 [IU] via SUBCUTANEOUS
  Filled 2022-11-04 (×13): qty 1

## 2022-11-04 MED ORDER — FREE WATER
200.0000 mL | Status: DC
  Administered 2022-11-04 – 2022-11-06 (×11): 200 mL

## 2022-11-04 MED ORDER — NEPRO/CARBSTEADY PO LIQD
237.0000 mL | Freq: Three times a day (TID) | ORAL | Status: DC
  Administered 2022-11-04 – 2022-11-07 (×8): 237 mL via ORAL

## 2022-11-04 NOTE — Progress Notes (Signed)
Pt transferred to the unit approx. 21:15 PM. Pt needed soft bore NG tube placed, ICU called and CN asked if she could help,with this task as no one on the unit has been trained to place this type of tube. Tube placed and placement verified. Tube feeding started approx 2:00 AM at 20 mls per hr, Pt. Tolerating. IV fluids stopped per order.

## 2022-11-04 NOTE — Progress Notes (Signed)
End of shift note:   Pt's feeding tube rate was increased to 30 ml/hr. Diet transitioned to Dysphagia 3 and pt tolerated diet changed. Palliative was consulted and plan of care was reviewed with family. Pt remained on 3L [place] since pt dropped to mid 80s while being on RA (MD was notified).

## 2022-11-04 NOTE — Progress Notes (Signed)
Nutrition Follow-up  DOCUMENTATION CODES:   Severe malnutrition in context of chronic illness  INTERVENTION:   Nepro Shake po TID, each supplement provides 425 kcal and 19 grams protein  Magic cup TID with meals, each supplement provides 290 kcal and 9 grams of protein  MVI po daily   Vitamin C 500mg  BID  Osmolite 1.5@50ml /hr continuous + ProSource TF 20- 33ml daily via tube  Free water flushes 252ml q4 hours   Regimen provides 1880kcal/day, 95g/day protein and 2157ml/day of free water.   Pt at high refeed risk; recommend monitor potassium, magnesium and phosphorus labs daily until stable  NUTRITION DIAGNOSIS:   Severe Malnutrition related to chronic illness (COPD, CHF, prostate cancer, lung cancer) as evidenced by moderate fat depletion, severe fat depletion, moderate muscle depletion, severe muscle depletion. -ongoing   GOAL:   Patient will meet greater than or equal to 90% of their needs -progressing with tube feeds   MONITOR:   PO intake, Supplement acceptance, Labs, Weight trends, TF tolerance, I & O's, Skin  ASSESSMENT:   82 y/o male with h/o CHF, COPD, DM, HLD, HTN, CAD, pacemaker, prostate cancer, SCLC s/p XRT, CKD III and blisters on bilateral feet who is admitted with foot infection and cellulitis.  Pt removed NGT yesterday and tube was replaced overnight. Tube feeds were resumed and are being advanced to goal rate. Pt more alert today. Pt evaluated by SLP and was placed on a dysphagia 1/nectar thick diet. RD suspects pt will not be able to eat enough to meet his estimated needs. Family does not want the NGT replaced if its removed again. Family will need to decide about G-tube if pt's oral intake remains poor and if they want to continue with full care. Pt remains at high refeed risk. RD will add oral supplements. Pt with hypernatremia; RD will increase free water. Per chart, pt remains weight stable since admission. Palliative care is following.   Medications  reviewed and include: vitamin C, insulin, MVI, senokot  Labs reviewed: Na 147(H), K 4.1 wnl, BUN 55(H), P 3.6 wnl, Mg 2.4 wnl  Cbgs- 138, 140, 145, 155 x 24 hrs  Diet Order:   Diet Order             DIET - DYS 1 Room service appropriate? Yes with Assist; Fluid consistency: Nectar Thick  Diet effective now                  EDUCATION NEEDS:   Not appropriate for education at this time  Skin:  Skin Assessment: Reviewed RN Assessment (R foot blister, L foot wound)  Last BM:  1/16- TYPE 5  Height:   Ht Readings from Last 1 Encounters:  10/22/22 5\' 8"  (1.727 m)    Weight:   Wt Readings from Last 1 Encounters:  11/04/22 90.6 kg    Ideal Body Weight:  70 kg  BMI:  Body mass index is 30.37 kg/m.  Estimated Nutritional Needs:   Kcal:  1900-2200kcal/day  Protein:  95-110g/day  Fluid:  > 1.9 L  Koleen Distance MS, RD, LDN Please refer to [facility] for RD and/or RD on-call/weekend/after hours pager

## 2022-11-04 NOTE — Progress Notes (Signed)
Speech Language Pathology Treatment: Dysphagia  Patient Details Name: Carlos Levine MRN: [id number] DOB: 1941/03/16 Today's Date: 11/04/2022 Time: 1410-1455 SLP Time Calculation (min) (ACUTE ONLY): 45 min  Assessment / Plan / Recommendation Clinical Impression  Pt seen for ongoing assessment of swallowing -- MD reached out to ST services to request f/u now that pt is more alert and engages more appropriately. Upon entering room, he responded verbally, was alert and engaged in tasks and conversation w/ SLP; He asked for "water". Pt could not recall having had a MBSS last week addressing his swallowing. Oral care completed - noted dried secretions on tongue. Pt has an NGT in place for TFs, on 3L [place] O2 support. WBC and temp WNL.   Pt explained general aspiration precautions and agreed verbally to the need for following them especially sitting upright for all oral intake -- supported behind the back more for full upright sitting. Pt was only given trials of nectar liquids via tsp/cup and puree in setting of deconditioning and illness/dysphagia as seen on the MBSS. No overt clinical s/s of aspiration were noted w/ the trials; respiratory status remained calm and unlabored, vocal quality clear b/t trials; no coughing. Pharyngeal swallowing appeared timely and hyolaryngeal excursion appeared adequate. Oral phase appeared [facility] for bolus management and timely A-P transfer for swallowing; oral clearing achieved w/ the consistencies.    Pt appears at increased risk for aspiration in setting of current illness/hospitalization, current NGT for nutrition support. When following general aspiration precautions and using a dysphagia level 1 diet w/ Nectar liquids, this risk appears reduced.  Recommend initiation of this dysphagia diet w/ aspiration precautions and feeding support/Supervision; Pills Crushed in Puree; tray setup and positioning assistance for meals. ST services will continue to follow for ongoing  assessment and will monitor for appropriateness to upgrade diet over next few days. Will monitor for NGT removal over next few days also.  MD and Palliative Care Team updated; NSG updated. Precautions posted at bedside.      HPI HPI: Pt is a 82 y.o. male with medical history unsure of his Cognitive status but w/ significant of hypertension, hyperlipidemia, Dm2 with CKD stage 3, sick sinus syndrome, chronic CHFrEF, Pafib, Elevated troponin, hx of prostate cancer w Recent admission for cellulitis in 09/2022 apparently presents due to worsening foot swelling and redness.  Note that patient declined Rehab during recent admission and went home instead.  Pt has apparent Cognitive decline, phonations only and yells out often during the afternoons.  PT is holding on him d/t being inappropriate medically at this time.   CXR: Slightly increasing interstitial markings within the left lung,  which may reflect edema or atypical/viral infection.  Head CT: No acute intracranial findings.  2. Chronic microvascular ischemic change and cerebral volume loss.  CT of Chest this admit: Increasing consolidation in the right lower and right upper lobes  when compared with the prior exam from June of 2023. Increasing  right-sided pleural effusion is noted as well. Persistent volume  loss in fullness in the right hilum is noted.      SLP Plan  Continue with current plan of care      Recommendations for follow up therapy are one component of a multi-disciplinary discharge planning process, led by the attending physician.  Recommendations may be updated based on patient status, additional functional criteria and insurance authorization.    Recommendations  Diet recommendations: Dysphagia 1 (puree);Nectar-thick liquid Liquids provided via: Teaspoon;Cup Medication Administration: Crushed with puree (or via  NGT) Supervision: Staff to assist with self feeding;Full supervision/cueing for compensatory strategies Compensations:  Minimize environmental distractions;Slow rate;Small sips/bites;Lingual sweep for clearance of pocketing;Follow solids with liquid Postural Changes and/or Swallow Maneuvers: Out of bed for meals;Seated upright 90 degrees;Upright 30-60 min after meal                General recommendations:  (Dietician f/u; Palliative Care f/u) Oral Care Recommendations: Oral care BID;Oral care before and after PO;Staff/trained caregiver to provide oral care Follow Up Recommendations: Skilled nursing-short term rehab (<3 hours/day) (TBD) Assistance recommended at discharge: Frequent or constant Supervision/Assistance SLP Visit Diagnosis: Dysphagia, oropharyngeal phase (R13.12) Plan: Continue with current plan of care             Carlos Som, MS, CCC-SLP Speech Language Pathologist Rehab Services; [facility] [phone] (ascom) [place]  11/04/2022, 5:13 PM

## 2022-11-04 NOTE — Progress Notes (Signed)
[facility]  - [place] at [facility]   PATIENT NAME: Carlos Levine    MR#:  [id number]  DATE OF BIRTH:  [birth year]  SUBJECTIVE:  No family at bedside  patient  a bit more alert and did consume some apple sauce with ST today. NGT was placed back after pt pulled it out yday. Will cont TF for now  No fever No respiratory distress noted VITALS:  Blood pressure 98/73, pulse 66, temperature 98.2 F (36.8 C), resp. rate 20, height 5\' 8"  (1.727 m), weight 90.6 kg, SpO2 95 %.  PHYSICAL EXAMINATION:   GENERAL:  82 y.o.-year-old patient lying in the bed with no acute distress. On [place] oxygen no resp distress at present LUNGS: decreased breath sounds bilaterally, no wheezing CARDIOVASCULAR: S1, S2 normal. No murmur   ABDOMEN: Soft, nontender, nondistended. EXTREMITIES:  bilateral decreased edema and chronic ulcers NEUROLOGIC: unable to assess but moves extremetis spontaneously Dependent edema+  LABORATORY PANEL:  CBC Recent Labs  Lab 11/01/22 0530  WBC 6.7  HGB 12.0*  HCT 39.6  PLT 120*     Chemistries  Recent Labs  Lab 11/04/22 0610 11/04/22 0851  NA  --  147*  K  --  4.1  CL  --  110  CO2  --  31  GLUCOSE  --  151*  BUN  --  55*  CREATININE  --  1.23  CALCIUM  --  8.2*  MG 2.4  --     Cardiac Enzymes No results for input(s): "TROPONINI" in the last 168 hours. RADIOLOGY:  DG Abd 1 View  Result Date: 11/04/2022 CLINICAL DATA:  Feeding tube placement EXAM: ABDOMEN - 1 VIEW COMPARISON:  11/03/2022 FINDINGS: Feeding tube in the stomach, unchanged since prior study. IMPRESSION: Feeding tube in the stomach. Electronically Signed   By: 11/05/2022 M.D.   On: 11/04/2022 00:57   DG Abd 1 View  Result Date: 11/03/2022 CLINICAL DATA:  Feeding tube placement EXAM: ABDOMEN - 1 VIEW COMPARISON:  PET-CT 12/20/2021 FINDINGS: A feeding tube is noted with tip in the stomach body. Hazy opacity at the right lung base, some of which may be from pleural effusion.  Thoracolumbar spondylosis and degenerative loss of intervertebral disc height. Gallstones noted. No dilated upper abdominal bowel. Contrast medium in the vicinity of the cecum. IMPRESSION: 1. Feeding tube tip: Stomach body. 2. Cholelithiasis. 3. Hazy opacity at the right lung base, some of which may be from pleural effusion. 4. Thoracolumbar spondylosis and degenerative loss of intervertebral disc height. Electronically Signed   By: 02/19/2022 M.D.   On: 11/03/2022 09:26    Assessment and Plan Carlos Levine is a 82 y.o. male with medical history significant of hypertension, hyperlipidemia, Dm2 with CKD stage 3, sick sinus syndrome, chronic CHFrEF, Pafib, Elevated troponin, hx of prostate cancer w recent admission for cellulitis apparently presents due to worsening foot swelling and redness.  Pt was told by cardiologist to come to ED.     Acute metabolic encephalopathy suspected due to hypercarbic respiratory failure with acidosis ?cognitive decline -- patient was placed on BiPAP transiently  --currently on 2 L nasal cannula oxygen. Mentation is still waxing -- PRN nebs -- did received IV Lasix BID with good diuresis.  --hold further lasix due to sodium trending up and pt appears dry clincally --d/c Lyrica/sedating meds --more alert today 1/16 --CT head--moderate atrophy  Diabetic foot infection severe peripheral arterial disease chronic lower extremity edema -- IV cefepime (known to cause mental  status changes)-- d/w ID  RPH--completed 7 days of IV abxs --wbc normal, no fever  Severe PAD -- vascular surgery consultation appreciated -- angiograms show severe bilateral lower extremity disease -- vascular recommending bilateral femoral endarterectomy with standing of SFA and above knee popliteal artery. -- Lengthy surgery will require patient's cardiac and pulmonary status be stable --Dr Gilda Crease d/w family (wife and dter0 on 10/30/22--and at present cont dressing changes. He thinks pt  likely will not do good with either endarterectomy or bilateral amputation. --cont dressing changes   Severe cardiomyopathy -- EF currently 35 to 40% with global hypokinesis -- received few IV Lasix, monitor input output and metabolic panel --Clinically appears a bit dry--Holding lasix  -- [facility] cardiology input appreciated--signed off --hold Home farxiga   A-fib, paroxysmal HR currently controlled - holding apixaban, started heparin 1/7, in anticipation of possible vascular surgery --will change to Lovenox for now till pt able to take po meds safely (d/w RPH) --1/15--now that pt has NG will give eliquis thru  NG    CAD - cont home statin   T2DM - continue SSI --holding farxiga   Peripheral Neuropathy - cont home pregabalin via NG   CKD 3b Kidney function at baseline though cr rising a bit --cont IVF   H/o Sick sinus Has pacemaker   h/o  Lung cancer H/o Prostate cancer In remission, surveilled by oncology Casodex held since per [facility] not crushable  Nutrition Status: Nutrition Problem: Severe Malnutrition Etiology: chronic illness (COPD, CHF, prostate cancer, lung cancer) Signs/Symptoms: moderate fat depletion, severe fat depletion, moderate muscle depletion, severe muscle depletion  PEr ST--pt did NOT do well with MBSS and ST eval due to cognitive decline and at high risk for aspiration. Keep NPO --1/14--spoke with wife betty and she wishes to proceed with NG tube feeding. She understands the risks and complications and also that this is a temporary mode of feeding. --1/15--s/p Dobhoff placement at bedside -- start NG feeding --1/16--since pt was more alert--ST was able to get 3 oz apple sauce and some nectar thick liq today. Will cont NG feeding and encourage to take po as well and hopefully d/c NG if PO intake improves. --dietitian to follow along   palliative care consultation appreciated.    Procedures: angiogram LE Family communication :wife on the phone  1/16 Consults :cardiology, Vascular CODE STATUS: DNR DVT Prophylaxis lovenox Level of care: Med-Surg Status is: Inpatient Remains inpatient appropriate because: AMS,Resp failure, severe PAD, malnutrition    TOTAL TIME TAKING CARE OF THIS PATIENT: 35 minutes.  >50% time spent on counselling and coordination of care  Note: This dictation was prepared with Dragon dictation along with smaller phrase technology. Any transcriptional errors that result from this process are unintentional.  [name] M.D    [facility]   CC: Primary care physician; Lynnea Ferrier, MD[facility]  - [place] at [facility]

## 2022-11-04 NOTE — TOC Progression Note (Signed)
Transition of Care [facility]) - Progression Note    Patient Details  Name: Carlos Levine MRN: [id number] Date of Birth: 1940-12-19  Transition of Care [facility]) CM/SW Contact  Chapman Fitch, RN Phone Number: 11/04/2022, 11:12 AM  Clinical Narrative:     Patient currently with tube feeds through NG.  Per MD palliative to follow up with goals of care today        Expected Discharge Plan and Services                                               Social Determinants of Health (SDOH) Interventions SDOH Screenings   Food Insecurity: No Food Insecurity (10/23/2022)  Housing: Low Risk  (10/23/2022)  Transportation Needs: No Transportation Needs (10/23/2022)  Utilities: Not At Risk (10/23/2022)  Financial Resource Strain: Low Risk  (04/13/2019)  Physical Activity: Unknown (04/13/2019)  Social Connections: Moderately Isolated (04/13/2019)  Stress: Stress Concern Present (04/13/2019)  Tobacco Use: Medium Risk (10/24/2022)    Readmission Risk Interventions    10/24/2022    3:03 PM  Readmission Risk Prevention Plan  Transportation Screening Complete  Medication Review (RN Care Manager) Complete  SW Recovery Care/Counseling Consult Complete  Palliative Care Screening Complete

## 2022-11-04 NOTE — Progress Notes (Signed)
Provider Notification:  [name] MD   Pt's SpO2 was mid 80s on RA. Pt was placed back on 3L [place]

## 2022-11-04 NOTE — Progress Notes (Addendum)
Daily Progress Note   Patient Name: Carlos Levine       Date: 11/04/2022 DOB: 1941/04/15  Age: 82 y.o. MRN#: [id number] Attending Physician: [name], MD Primary Care Physician: Lynnea Ferrier, MD Admit Date: 10/22/2022  Reason for Consultation/Follow-up: Establishing goals of care  Subjective: Notes and labs reviewed.  Patient had a feeding tube placed due to swallow function, and he removed the feeding tube.  This was replaced by staff.  Upon entering room today, patient does not have family at bedside.  He is sitting in the bed with mittens in place.  He says the word "water" several times.  Attempted to discuss goals of care with him, but he just continues to state the word "water".  Called to speak to patient's wife.  She states she was aware that he had removed his feeding tube, but was unaware that the tube had been replaced.  She states that over the weekend he kept asking for food.  Discussed his desire for water.  Discussed the mittens which are in place to prevent him from removing the tube.  Discussed multiple scenarios and acceptable quality of life.  Discussed the swallowing, but also the overall big picture with his PAD and chronic health issues.    Ultimately, we discussed reaching out to speech therapy to see if they will reevaluate now that patient is more alert.    Discussed seeing how he does over the next couple of days as feeding tube has been replaced.  Discussed not replacing feeding tube if he removes it again, and discussing goals of care at that point.    Length of Stay: 12  Current Medications: Scheduled Meds:   apixaban  5 mg Per Tube BID   vitamin C  500 mg Per Tube BID   feeding supplement (PROSource TF20)  60 mL Per Tube Daily   free water  165 mL Per  Tube Q4H   insulin aspart  0-5 Units Subcutaneous QHS   insulin aspart  0-9 Units Subcutaneous Q4H   multivitamin with minerals  1 tablet Per Tube Daily   polyethylene glycol  17 g Per Tube Daily   pravastatin  80 mg Oral QHS   pregabalin  50 mg Per Tube BID   senna  1 tablet Oral Daily   sodium  chloride flush  3 mL Intravenous Q12H    Continuous Infusions:  sodium chloride     feeding supplement (OSMOLITE 1.5 CAL) 20 mL/hr at 11/04/22 0200    PRN Meds: sodium chloride, acetaminophen **OR** acetaminophen, ipratropium-albuterol, ondansetron (ZOFRAN) IV, mouth rinse, sodium chloride flush  Physical Exam Pulmonary:     Effort: Pulmonary effort is normal.  Neurological:     Mental Status: He is alert.             Vital Signs: BP 98/73 (BP Location: Left Arm)   Pulse 66   Temp 98.2 F (36.8 C)   Resp 20   Ht 5\' 8"  (1.727 m)   Wt 90.6 kg   SpO2 100%   BMI 30.37 kg/m  SpO2: SpO2: 100 % O2 Device: O2 Device: Nasal Cannula O2 Flow Rate: O2 Flow Rate (L/min): 3 L/min  Intake/output summary:  Intake/Output Summary (Last 24 hours) at 11/04/2022 1239 Last data filed at 11/04/2022 11/06/2022 Gross per 24 hour  Intake 265 ml  Output 350 ml  Net -85 ml   LBM: Last BM Date : 11/03/22 Baseline Weight: Weight: 91.4 kg Most recent weight: Weight: 90.6 kg         Patient Active Problem List   Diagnosis Date Noted   Protein-calorie malnutrition, severe 11/03/2022   Peripheral edema 10/24/2022   Cellulitis 10/23/2022   HFrEF (heart failure with reduced ejection fraction) (HCC) 10/23/2022   Normocytic anemia 10/22/2022   Hyponatremia 10/06/2022   Orthostatic hypotension 10/05/2022   Generalized weakness 10/05/2022   Malnutrition of moderate degree 10/03/2022   Constipation 10/03/2022   Cellulitis in diabetic foot (HCC) 10/02/2022   Acute on chronic HFrEF (heart failure with reduced ejection fraction) (HCC) 10/01/2022   Drug-induced gynecomastia 04/03/2022   PAF (paroxysmal  atrial fibrillation) (HCC) 04/03/2022   Loss of weight 12/18/2021   History of prostate cancer 12/18/2021   S/P placement of leadless cardiac pacemaker 08/09/2020   Sick sinus syndrome (HCC) 07/25/2020   Coronary artery disease involving native coronary artery of native heart 09/23/2019   SOB (shortness of breath) 05/26/2019   SVT (supraventricular tachycardia) 05/26/2019   Squamous cell lung cancer, right (HCC) 04/20/2019   Localized enlarged lymph nodes 03/29/2019   Goals of care, counseling/discussion 03/29/2019   Prostate cancer (HCC) 03/08/2019   Chronic bilateral low back pain without sciatica 02/21/2018   Chronic constipation 11/19/2017   Elevated troponin 06/11/2017   Dizziness 06/24/2016   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  Time for outcomes.  Wife does not want feeding tube replaced again if patient removes it. PMT will follow-up  Code Status:    Code Status Orders  (From admission, onward)           Start     Ordered   10/22/22 2356  Do not attempt resuscitation (DNR)  Continuous       Question Answer Comment  If patient has no pulse  and is not breathing Do Not Attempt Resuscitation   If patient has a pulse and/or is breathing: Medical Treatment Goals LIMITED ADDITIONAL INTERVENTIONS: Use medication/IV fluids and cardiac monitoring as indicated; Do not use  intubation or mechanical ventilation (DNI), also provide comfort medications.  Transfer to Progressive/Stepdown as indicated, avoid Intensive Care.   Consent: Discussion documented in EHR or advanced directives reviewed      10/23/22 0003           Code Status History     Date Active Date Inactive Code Status Order ID Comments User Context   10/07/2022 1841 10/11/2022 0103 DNR [id number]  Pennie Banter, DO Inpatient   10/01/2022 1559 10/07/2022 1841 Full Code [id number]  Cox, Amy Dorris Carnes, DO ED   07/25/2020 1113 07/26/2020 1622 Full Code [id number]  Marcina Millard, MD Inpatient   05/26/2019 1508 05/27/2019 1554 Full Code [id number]  Adrian Saran, MD Inpatient   06/11/2017 1937 06/13/2017 1630 Full Code [id number]  Gracelyn Nurse, MD ED       Care plan was discussed with attending  Thank you for allowing the Palliative Medicine Team to assist in the care of this patient.    Morton Stall, NP  Please contact Palliative Medicine Team phone at [phone] for questions and concerns.

## 2022-11-05 DIAGNOSIS — Z7189 Other specified counseling: Secondary | ICD-10-CM | POA: Diagnosis not present

## 2022-11-05 DIAGNOSIS — I10 Essential (primary) hypertension: Secondary | ICD-10-CM | POA: Diagnosis not present

## 2022-11-05 DIAGNOSIS — N1832 Chronic kidney disease, stage 3b: Secondary | ICD-10-CM | POA: Diagnosis not present

## 2022-11-05 DIAGNOSIS — E11628 Type 2 diabetes mellitus with other skin complications: Secondary | ICD-10-CM | POA: Diagnosis not present

## 2022-11-05 DIAGNOSIS — L03119 Cellulitis of unspecified part of limb: Secondary | ICD-10-CM | POA: Diagnosis not present

## 2022-11-05 DIAGNOSIS — I480 Paroxysmal atrial fibrillation: Secondary | ICD-10-CM | POA: Diagnosis not present

## 2022-11-05 LAB — GLUCOSE, CAPILLARY
Glucose-Capillary: 246 mg/dL — ABNORMAL HIGH (ref 70–99)
Glucose-Capillary: 137 mg/dL — ABNORMAL HIGH (ref 70–99)
Glucose-Capillary: 174 mg/dL — ABNORMAL HIGH (ref 70–99)
Glucose-Capillary: 208 mg/dL — ABNORMAL HIGH (ref 70–99)
Glucose-Capillary: 127 mg/dL — ABNORMAL HIGH (ref 70–99)
Glucose-Capillary: 230 mg/dL — ABNORMAL HIGH (ref 70–99)

## 2022-11-05 MED ORDER — DEXTROSE 5 % IV SOLN: INTRAVENOUS | Status: DC

## 2022-11-05 MED ORDER — INSULIN ASPART 100 UNIT/ML IJ SOLN
20.0000 [IU] | Freq: Once | INTRAMUSCULAR | Status: AC
  Administered 2022-11-05: 20 [IU] via SUBCUTANEOUS
  Filled 2022-11-05: qty 1

## 2022-11-05 NOTE — Progress Notes (Signed)
Progress Note   Patient: Carlos Levine WVX:[id number] DOB: Mar 06, 1941 DOA: 10/22/2022     13 DOS: the patient was seen and examined on 11/05/2022   Brief hospital course: Carlos Levine is a 82 y.o. male with medical history significant of hypertension, hyperlipidemia, Dm2 with CKD stage 3, sick sinus syndrome, chronic CHFrEF, Pafib, Elevated troponin, hx of prostate cancer w recent admission for cellulitis apparently presents due to worsening foot swelling and redness.  Pt was told by cardiologist to come to ED.      Assessment and Plan:  Acute metabolic encephalopathy suspected due to hypercarbic respiratory failure with acidosis ?cognitive decline -- patient was placed on BiPAP transiently  --currently on 3 L nasal cannula oxygen. Mentation is still waxing and waning -- PRN nebs -- Holding Lasix and starting D5 water as sodium trending up.  Could be dry -- His mental status seems close to baseline --CT head--moderate atrophy   Diabetic foot infection severe peripheral arterial disease chronic lower extremity edema -- IV cefepime (known to cause mental status changes)-- d/w ID  RPH--completed 7 days of IV abxs --wbc normal, no fever   Severe PAD -- vascular surgery consultation appreciated -- angiograms show severe bilateral lower extremity disease -- vascular recommending bilateral femoral endarterectomy with standing of SFA and above knee popliteal artery. -- Lengthy surgery will require patient's cardiac and pulmonary status be stable --Dr Gilda Crease d/w family (wife and dter on 10/30/22--and at present cont dressing changes. He thinks pt likely will not do good with either endarterectomy or bilateral amputation. --cont dressing changes    Severe cardiomyopathy -- EF currently 35 to 40% with global hypokinesis -- received few IV Lasix early on but now on hold as he clinically appears dry, monitor input output and metabolic panel -- [facility] cardiology input appreciated--signed  off --hold Home farxiga    A-fib, paroxysmal HR currently controlled -On Eliquis    CAD - cont home statin   T2DM - continue SSI --holding farxiga   Peripheral Neuropathy - cont home pregabalin via NG   CKD 3b Kidney function at baseline though cr rising a bit --cont IVF   H/o Sick sinus Has pacemaker    h/o  Lung cancer H/o Prostate cancer In remission, surveilled by oncology Casodex held since per [facility] not crushable   Nutrition Status: Nutrition Problem: Severe Malnutrition Etiology: chronic illness (COPD, CHF, prostate cancer, lung cancer) Signs/Symptoms: moderate fat depletion, severe fat depletion, moderate muscle depletion, severe muscle depletion   PEr ST--pt did NOT do well with MBSS and ST eval due to cognitive decline and at high risk for aspiration. Keep NPO --1/14--spoke with wife betty and she wishes to proceed with NG tube feeding. She understands the risks and complications and also that this is a temporary mode of feeding. --1/15--s/p Dobhoff placement at bedside -- start NG feeding --1/16--since pt was more alert--ST was able to get 3 oz apple sauce and some nectar thick liq today. Will cont NG feeding and encourage to take po as well and hopefully d/c NG if PO intake improves. --dietitian to follow along  Goals of care very poor prognosis, palliative care following, could be hospice appropriate at home     Subjective: Seems more alert.  Requesting to get NG tube out for possible  Physical Exam: Vitals:   11/04/22 2021 11/05/22 0413 11/05/22 0453 11/05/22 0805  BP: 100/65 101/61  93/66  Pulse: 87 (!) 50  72  Resp: 18 18  16   Temp: 98.7  F (37.1 C) 97.8 F (36.6 C)  98.9 F (37.2 C)  TempSrc: Oral     SpO2: 92% 92%  95%  Weight:   91.5 kg   Height:       GENERAL:  82 y.o.-year-old patient lying in the bed with no acute distress. On [place] oxygen no resp distress at present LUNGS: decreased breath sounds bilaterally, no  wheezing CARDIOVASCULAR: S1, S2 normal. No murmur   ABDOMEN: Soft, nontender, nondistended.  EXTREMITIES:  bilateral chronic ulcers, see pictures below NEUROLOGIC: Alert and awake, nonfocal       Data Reviewed:  There are no new results to review at this time.  Family Communication: None today  Disposition: Status is: Inpatient Remains inpatient appropriate because: Management of poor nutritional status and mental status.  NG tube/Dobbhoff needs removal before discharge and to make sure he is maintaining nutrition orally  Planned Discharge Destination: Home with Home Health   DVT prophylaxis-Eliquis Time spent: 35 minutes  Author: Delfino Lovett, MD 11/05/2022 2:27 PM  For on call review [email or web address].

## 2022-11-05 NOTE — Evaluation (Addendum)
Clinical Dysphagia Treatment Patient Details  Name: Carlos Levine MRN: [id number] Date of Birth: 05-Jul-1941  Today's Date: 11/05/2022 Time: SLP Start Time (ACUTE ONLY): 1320 SLP Stop Time (ACUTE ONLY): 1420 SLP Time Calculation (min) (ACUTE ONLY): 60 min  Past Medical History:  Past Medical History:  Diagnosis Date   Arrhythmia    PAF   Arthritis    Cancer (HCC)    prostate   CHF (congestive heart failure) (HCC)    Chronic kidney disease    COPD (chronic obstructive pulmonary disease) (HCC)    Coronary artery disease    Diabetes mellitus without complication (HCC)    Dyspnea    with minimal exertion   Dysrhythmia    history of SVT   History of kidney stones    History of radiation therapy    lung   Hyperkalemia 11/19/2017   Hyperlipidemia    Hypertension    Peripheral vascular disease (HCC)    Prostate cancer (HCC)    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   also has history of skin cancer   Past Surgical History:  Past Surgical History:  Procedure Laterality Date   AMPUTATION FINGER / THUMB Right    BACK SURGERY  1975   removed 2 discs and fused others   LOWER EXTREMITY ANGIOGRAPHY Left 10/24/2022   Procedure: Lower Extremity Angiography;  Surgeon: Renford Dills, MD;  Location: [facility] INVASIVE CV LAB;  Service: Cardiovascular;  Laterality: Left;   PACEMAKER LEADLESS INSERTION N/A 07/25/2020   Procedure: PACEMAKER LEADLESS INSERTION;  Surgeon: Marcina Millard, MD;  Location: [facility] INVASIVE CV LAB;  Service: Cardiovascular;  Laterality: N/A;   PROSTATE CRYOABLATION     SPINAL FUSION     STENT PLACEMENT ILIAC ([facility] HX) Left 2014   Left common iliac and left external iliac by Dr. Gilda Crease   VIDEO BRONCHOSCOPY WITH ENDOBRONCHIAL NAVIGATION N/A 05/30/2019   Procedure: VIDEO BRONCHOSCOPY WITH ENDOBRONCHIAL NAVIGATION, DIABETIC;  Surgeon: Vida Rigger, MD;  Location: [facility] ORS;  Service: Thoracic;  Laterality: N/A;   VIDEO BRONCHOSCOPY WITH ENDOBRONCHIAL NAVIGATION  N/A 05/28/2020   Procedure: VIDEO BRONCHOSCOPY WITH ENDOBRONCHIAL NAVIGATION;  Surgeon: Vida Rigger, MD;  Location: [facility] ORS;  Service: Thoracic;  Laterality: N/A;   VIDEO BRONCHOSCOPY WITH ENDOBRONCHIAL ULTRASOUND N/A 05/30/2019   Procedure: VIDEO BRONCHOSCOPY WITH ENDOBRONCHIAL ULTRASOUND, DIABETIC;  Surgeon: Vida Rigger, MD;  Location: [facility] ORS;  Service: Thoracic;  Laterality: N/A;   VIDEO BRONCHOSCOPY WITH ENDOBRONCHIAL ULTRASOUND N/A 05/28/2020   Procedure: VIDEO BRONCHOSCOPY WITH ENDOBRONCHIAL ULTRASOUND;  Surgeon: Vida Rigger, MD;  Location: [facility] ORS;  Service: Thoracic;  Laterality: N/A;   HPI:  Pt is a 82 y.o. male with medical history unsure of his Cognitive status but w/ significant of hypertension, hyperlipidemia, Dm2 with CKD stage 3, sick sinus syndrome, chronic CHFrEF, Pafib, Elevated troponin, hx of prostate cancer w Recent admission for cellulitis in 09/2022 apparently presents due to worsening foot swelling and redness.  Note that patient declined Rehab during recent admission and went home instead.  Pt has apparent Cognitive decline, phonations only and yells out often during the afternoons.  PT is holding on him d/t being inappropriate medically at this time.   CXR: Slightly increasing interstitial markings within the left lung,  which may reflect edema or atypical/viral infection.  Head CT: No acute intracranial findings.  2. Chronic microvascular ischemic change and cerebral volume loss.  CT of Chest this admit: Increasing consolidation in the right lower and right upper lobes  when compared with the prior  exam from June of 2023. Increasing  right-sided pleural effusion is noted as well. Persistent volume  loss in fullness in the right hilum is noted.    Assessment / Plan / Recommendation  Clinical Impression  Pt seen for ongoing dysphagia treatment today. Pt continues with NGT for TFs for nutrition support. Pt appeared calm and min. drowsy throughout treatment indicated by  soft vocal quality and requiring stimulation to gain attention to tasks. Pt was laying in bed with his eyes closed upon arrival. He turned his head on opened his eyes in response to his name. Pt made multiple requests for "water" and typically used gestures to communicate needs/wants. On [place] O2 support 3L; afebrile. WBC not elevated as of 11/01/22.   During po trials of thin liquids, pt exhibited mod/severe oropharyngeal dysphagia and high risk of aspiration characterized by wet vocal quality after trials, immediate/delayed weak cough, and min. effortful breathing b/t trials. Pt did not exhibit overt s/s of aspiration given nectar thick liquids and purees. Pt exhibited oral phase deficits given trials of puree and nectar thick liquids c/b oral residue, reduced lingual movement, prolonged AP transit, and tongue pumping. Noted weak anterior labial seal throughout treatment and one instance of drooling.  Alternating food/liquid trials and verbal cues appeared to aid pt's awareness to clear orally between trials. Trials were given with min/mod verbal, visual, and tactile cues. Pt attempted to hold cup to drink but was severely weak and tremorous.   Recommend continued Dysphagia 1 diet w/ Nectar liquids. Recommend this dysphagia diet w/ strict aspiration precautions and feeding support/Supervision; monitoring for oral clearing and respiratory status during feeding/meals; Pills Crushed in Puree; tray setup and positioning assistance for meals.  ST services will continue to monitor pt status while admitted and provide education to pt/wife/team. Do not recommend diet upgrade due to mod/severe risk for aspiration/aspiration pneumonia until pt's medical status improves to indicate appropriateness for such.   SLP Visit Diagnosis: Dysphagia, oropharyngeal phase (R13.12)    Aspiration Risk  Risk for inadequate nutrition/hydration;Moderate aspiration risk    Diet Recommendation  Recommend continued Dysphagia 1 diet w/  Nectar liquids. Recommend this dysphagia diet w/ strict aspiration precautions and feeding support/Supervision; monitoring for oral clearing and respiratory status during feeding/meals  Medication Administration: Crushed with puree    Other  Recommendations Recommended Consults:  (Palliative Care, Dietition) Oral Care Recommendations: Oral care BID;Oral care before and after PO;Staff/trained caregiver to provide oral care Other Recommendations: Order thickener from pharmacy;Prohibited food (jello, ice cream, thin soups);Have oral suction available;Remove water pitcher    Recommendations for follow up therapy are one component of a multi-disciplinary discharge planning process, led by the attending physician.  Recommendations may be updated based on patient status, additional functional criteria and insurance authorization.  Follow up Recommendations Skilled nursing-short term rehab (<3 hours/day)      Assistance Recommended at Discharge  Full  Functional Status Assessment Patient has had a recent decline in their functional status and/or demonstrates limited ability to make significant improvements in function in a reasonable and predictable amount of time  Frequency and Duration min 1 x/week  1 week       Prognosis Prognosis for Safe Diet Advancement: Guarded Barriers to Reach Goals: Cognitive deficits;Language deficits;Time post onset;Severity of deficits Barriers/Prognosis Comment: cognitive decline      Swallow Study   General Date of Onset: 10/22/22 HPI: Pt is a 82 y.o. male with medical history unsure of his Cognitive status but w/ significant of hypertension, hyperlipidemia, Dm2  with CKD stage 3, sick sinus syndrome, chronic CHFrEF, Pafib, Elevated troponin, hx of prostate cancer w Recent admission for cellulitis in 09/2022 apparently presents due to worsening foot swelling and redness.  Note that patient declined Rehab during recent admission and went home instead.  Pt has  apparent Cognitive decline, phonations only and yells out often during the afternoons.  PT is holding on him d/t being inappropriate medically at this time.   CXR: Slightly increasing interstitial markings within the left lung,  which may reflect edema or atypical/viral infection.  Head CT: No acute intracranial findings.  2. Chronic microvascular ischemic change and cerebral volume loss.  CT of Chest this admit: Increasing consolidation in the right lower and right upper lobes  when compared with the prior exam from June of 2023. Increasing  right-sided pleural effusion is noted as well. Persistent volume  loss in fullness in the right hilum is noted. Previous Swallow Assessment: BSe, MBSS 10/31/2022 (Pt exhibited SEVERE s/s of oropharyngeal phase dysphagia and recommended NPO diet.) Diet Prior to this Study: Dysphagia 1 (puree);Nectar-thick liquids;NG Tube Temperature Spikes Noted: No (WBC 6.7 on 11/01/22) Respiratory Status: Nasal cannula (3 L) History of Recent Intubation: No Behavior/Cognition: Lethargic/Drowsy;Requires cueing;Cooperative;Impulsive;Alert Oral Cavity Assessment: Dry;Dried secretions (On tongue-thrush?) Oral Care Completed by SLP: Yes Oral Cavity - Dentition: Edentulous Self-Feeding Abilities: Needs assist;Needs set up;Total assist Patient Positioning: Upright in bed (Needed positioning) Baseline Vocal Quality: Low vocal intensity Volitional Cough: Weak;Congested Volitional Swallow: Able to elicit    Oral/Motor/Sensory Function Overall Oral Motor/Sensory Function: Generalized oral weakness   Ice Chips Ice chips: Impaired Presentation: Spoon (x2) Oral Phase Impairments: Reduced labial seal;Reduced lingual movement/coordination;Poor awareness of bolus Oral Phase Functional Implications: Prolonged oral transit Pharyngeal Phase Impairments:  (No overt)   Thin Liquid Thin Liquid: Impaired (~ 2-3 oz) Presentation: Cup;Self Fed (with min assistance and min/mod cues) Oral Phase  Impairments: Reduced labial seal;Reduced lingual movement/coordination;Poor awareness of bolus Oral Phase Functional Implications: Right anterior spillage;Left anterior spillage;Prolonged oral transit Pharyngeal  Phase Impairments: Suspected delayed Swallow;Decreased hyoid-laryngeal movement;Wet Vocal Quality;Cough - Immediate (Effortful breathing)    Nectar Thick Nectar Thick Liquid: Impaired (~8-10 tsp) Presentation: Spoon Oral Phase Impairments: Poor awareness of bolus;Reduced labial seal;Reduced lingual movement/coordination Oral phase functional implications: Prolonged oral transit Pharyngeal Phase Impairments:  (No overt)   Honey Thick Honey Thick Liquid: Not tested   Puree Puree: Impaired Presentation: Spoon (10+) Oral Phase Impairments: Reduced lingual movement/coordination;Poor awareness of bolus;Reduced labial seal Oral Phase Functional Implications: Prolonged oral transit;Oral residue Pharyngeal Phase Impairments: Multiple swallows;Cough - Delayed (Cough x1)   Solid     Solid: Not tested     Dennie Fetters Graduate Clinician [facility], Speech Pathology   Dennie Fetters 11/05/2022,4:20 PM    The information in this patient note, response to treatment, and overall treatment plan developed has been reviewed and agreed upon after reviewing documentation. This session was performed under the supervision of this licensed clinician.  This information from the tx session was shared w/ the Palliative Care team for ongoing discussion of pt's GOC w/ pt/Wife. MD updated also.  Jerilynn Som, MS, CCC-SLP [phone] 11/06/2022, 6:16 PM

## 2022-11-05 NOTE — Progress Notes (Addendum)
Daily Progress Note   Patient Name: Carlos Levine       Date: 11/05/2022 DOB: 08-14-1941  Age: 82 y.o. MRN#: [id number] Attending Physician: Delfino Lovett, MD Primary Care Physician: Lynnea Ferrier, MD Admit Date: 10/22/2022  Reason for Consultation/Follow-up: Establishing goals of care  Subjective: Notes and labs reviewed.  Spoke with SLP who states patient did well with swallow evaluation yesterday.  In to see patient.  No family at bedside.  Currently, he has a bottle of Ensure, in addition to a cup with a lid at his bedside.  He states he loves the Ensure.  He states he is happy to be able to have something to drink.  While I was at bedside, patient had secretions and coughed to clear his throat, and appeared to move secretions effectively.  PMT will continue to follow, and provide time for outcomes as discussed with wife.  Length of Stay: 13  Current Medications: Scheduled Meds:   apixaban  5 mg Per Tube BID   vitamin C  500 mg Per Tube BID   feeding supplement (NEPRO CARB STEADY)  237 mL Oral TID BM   feeding supplement (PROSource TF20)  60 mL Per Tube Daily   free water  200 mL Per Tube Q4H   insulin aspart  0-9 Units Subcutaneous Q4H   multivitamin with minerals  1 tablet Per Tube Daily   polyethylene glycol  17 g Per Tube Daily   pravastatin  80 mg Oral QHS   pregabalin  50 mg Per Tube BID   senna  1 tablet Oral Daily   sodium chloride flush  3 mL Intravenous Q12H    Continuous Infusions:  sodium chloride     feeding supplement (OSMOLITE 1.5 CAL) 1,000 mL (11/05/22 1018)    PRN Meds: sodium chloride, acetaminophen **OR** acetaminophen, ipratropium-albuterol, ondansetron (ZOFRAN) IV, mouth rinse, sodium chloride flush  Physical Exam Pulmonary:     Effort: Pulmonary  effort is normal.  Neurological:     Mental Status: He is alert.             Vital Signs: BP 93/66 (BP Location: Left Arm)   Pulse 72   Temp 98.9 F (37.2 C)   Resp 16   Ht 5\' 8"  (1.727 m)   Wt 91.5 kg   SpO2  95%   BMI 30.67 kg/m  SpO2: SpO2: 95 % O2 Device: O2 Device: Nasal Cannula O2 Flow Rate: O2 Flow Rate (L/min): 3 L/min  Intake/output summary:  Intake/Output Summary (Last 24 hours) at 11/05/2022 1245 Last data filed at 11/04/2022 1624 Gross per 24 hour  Intake --  Output 200 ml  Net -200 ml   LBM: Last BM Date : 11/05/22 Baseline Weight: Weight: 91.4 kg Most recent weight: Weight: 91.5 kg   Patient Active Problem List   Diagnosis Date Noted   Protein-calorie malnutrition, severe 11/03/2022   Peripheral edema 10/24/2022   Cellulitis 10/23/2022   HFrEF (heart failure with reduced ejection fraction) (HCC) 10/23/2022   Normocytic anemia 10/22/2022   Hyponatremia 10/06/2022   Orthostatic hypotension 10/05/2022   Generalized weakness 10/05/2022   Malnutrition of moderate degree 10/03/2022   Constipation 10/03/2022   Cellulitis in diabetic foot (HCC) 10/02/2022   Acute on chronic HFrEF (heart failure with reduced ejection fraction) (HCC) 10/01/2022   Drug-induced gynecomastia 04/03/2022   PAF (paroxysmal atrial fibrillation) (HCC) 04/03/2022   Loss of weight 12/18/2021   History of prostate cancer 12/18/2021   S/P placement of leadless cardiac pacemaker 08/09/2020   Sick sinus syndrome (HCC) 07/25/2020   Coronary artery disease involving native coronary artery of native heart 09/23/2019   SOB (shortness of breath) 05/26/2019   SVT (supraventricular tachycardia) 05/26/2019   Squamous cell lung cancer, right (HCC) 04/20/2019   Localized enlarged lymph nodes 03/29/2019   Goals of care, counseling/discussion 03/29/2019   Prostate cancer (HCC) 03/08/2019   Chronic bilateral low back pain without sciatica 02/21/2018   Chronic constipation 11/19/2017   Elevated  troponin 06/11/2017   Dizziness 06/24/2016   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 PMT will follow and provide time for outcomes as discussed with wife yesterday.  Code Status:    Code Status Orders  (From admission, onward)           Start     Ordered   10/22/22 2356  Do not attempt resuscitation (DNR)  Continuous       Question Answer Comment  If patient has no pulse and is not breathing Do Not Attempt Resuscitation   If patient has a pulse and/or is breathing: Medical Treatment Goals LIMITED ADDITIONAL INTERVENTIONS: Use medication/IV fluids and cardiac monitoring as indicated; Do not use intubation or mechanical ventilation (DNI), also provide comfort medications.  Transfer to Progressive/Stepdown as indicated, avoid Intensive Care.   Consent: Discussion documented in EHR or advanced directives reviewed      10/23/22 0003           Code Status History     Date Active Date Inactive Code Status Order ID Comments User Context   10/07/2022 1841 10/11/2022 0103 DNR [id number]  Pennie Banter, DO Inpatient   10/01/2022 1559 10/07/2022 1841 Full Code [id number]  Cox, Amy Dorris Carnes, DO ED   07/25/2020 1113 07/26/2020 1622 Full Code [id number]  Marcina Millard, MD Inpatient   05/26/2019  1508 05/27/2019 1554 Full Code [id number]  Adrian Saran, MD Inpatient   06/11/2017 1937 06/13/2017 1630 Full Code [id number]  Gracelyn Nurse, MD ED      Care plan was discussed with SLP  Thank you for allowing the Palliative Medicine Team to assist in the care of this patient.     Morton Stall, NP  Please contact Palliative Medicine Team phone at [phone] for questions and concerns.

## 2022-11-06 DIAGNOSIS — C3491 Malignant neoplasm of unspecified part of right bronchus or lung: Secondary | ICD-10-CM | POA: Diagnosis not present

## 2022-11-06 DIAGNOSIS — N1832 Chronic kidney disease, stage 3b: Secondary | ICD-10-CM | POA: Diagnosis not present

## 2022-11-06 DIAGNOSIS — Z7189 Other specified counseling: Secondary | ICD-10-CM | POA: Diagnosis not present

## 2022-11-06 DIAGNOSIS — I480 Paroxysmal atrial fibrillation: Secondary | ICD-10-CM | POA: Diagnosis not present

## 2022-11-06 DIAGNOSIS — E11628 Type 2 diabetes mellitus with other skin complications: Secondary | ICD-10-CM | POA: Diagnosis not present

## 2022-11-06 DIAGNOSIS — E43 Unspecified severe protein-calorie malnutrition: Secondary | ICD-10-CM | POA: Diagnosis not present

## 2022-11-06 DIAGNOSIS — L03119 Cellulitis of unspecified part of limb: Secondary | ICD-10-CM | POA: Diagnosis not present

## 2022-11-06 LAB — GLUCOSE, CAPILLARY
Glucose-Capillary: 376 mg/dL — ABNORMAL HIGH (ref 70–99)
Glucose-Capillary: 141 mg/dL — ABNORMAL HIGH (ref 70–99)
Glucose-Capillary: 147 mg/dL — ABNORMAL HIGH (ref 70–99)
Glucose-Capillary: 300 mg/dL — ABNORMAL HIGH (ref 70–99)

## 2022-11-06 LAB — BASIC METABOLIC PANEL
Anion gap: 6 (ref 5–15)
BUN: 77 mg/dL — ABNORMAL HIGH (ref 8–23)
CO2: 31 mmol/L (ref 22–32)
Calcium: 8.5 mg/dL — ABNORMAL LOW (ref 8.9–10.3)
Chloride: 107 mmol/L (ref 98–111)
Creatinine, Ser: 1.34 mg/dL — ABNORMAL HIGH (ref 0.61–1.24)
GFR, Estimated: 53 mL/min — ABNORMAL LOW (ref >60)
Glucose, Bld: 135 mg/dL — ABNORMAL HIGH (ref 70–99)
Potassium: 4.8 mmol/L (ref 3.5–5.1)
Sodium: 144 mmol/L (ref 135–145)

## 2022-11-06 LAB — CBC
HCT: 37.5 % — ABNORMAL LOW (ref 39.0–52.0)
Hemoglobin: 11 g/dL — ABNORMAL LOW (ref 13.0–17.0)
MCH: 30.5 pg (ref 26.0–34.0)
MCHC: 29.3 g/dL — ABNORMAL LOW (ref 30.0–36.0)
MCV: 103.9 fL — ABNORMAL HIGH (ref 80.0–100.0)
Platelets: 116 10*3/uL — ABNORMAL LOW (ref 150–400)
RBC: 3.61 MIL/uL — ABNORMAL LOW (ref 4.22–5.81)
RDW: 17.1 % — ABNORMAL HIGH (ref 11.5–15.5)
WBC: 8.8 10*3/uL (ref 4.0–10.5)
nRBC: 0 % (ref 0.0–0.2)

## 2022-11-06 MED ORDER — GLYCOPYRROLATE 1 MG PO TABS: 1.0000 mg | ORAL_TABLET | ORAL | Status: DC | PRN

## 2022-11-06 MED ORDER — MORPHINE SULFATE (PF) 2 MG/ML IV SOLN: 1.0000 mg | INTRAVENOUS | Status: DC | PRN

## 2022-11-06 MED ORDER — ACETAMINOPHEN 325 MG PO TABS: 650.0000 mg | ORAL_TABLET | Freq: Four times a day (QID) | ORAL | Status: DC | PRN

## 2022-11-06 MED ORDER — POLYVINYL ALCOHOL 1.4 % OP SOLN: 1.0000 [drp] | Freq: Four times a day (QID) | OPHTHALMIC | Status: DC | PRN

## 2022-11-06 MED ORDER — LORAZEPAM 1 MG PO TABS: 1.0000 mg | ORAL_TABLET | ORAL | Status: DC | PRN

## 2022-11-06 MED ORDER — GLYCOPYRROLATE 0.2 MG/ML IJ SOLN
0.2000 mg | INTRAMUSCULAR | Status: DC | PRN
  Administered 2022-11-08: 0.2 mg via INTRAVENOUS
  Filled 2022-11-06 (×2): qty 1

## 2022-11-06 MED ORDER — GLYCOPYRROLATE 0.2 MG/ML IJ SOLN: 0.2000 mg | INTRAMUSCULAR | Status: DC | PRN

## 2022-11-06 MED ORDER — ACETAMINOPHEN 650 MG RE SUPP: 650.0000 mg | Freq: Four times a day (QID) | RECTAL | Status: DC | PRN

## 2022-11-06 NOTE — IPAL (Signed)
  Interdisciplinary Goals of Care Family Meeting   Date carried out: 11/06/2022  Location of the meeting: Phone conference  Member's involved: Physician and Family Member or next of kin  Durable Power of Attorney or Environmental health practitioner: Wife  Discussion: We discussed goals of care for Carlos Levine   Code status:   Code Status: DNR/comfort care  Disposition: Home with Hospice likely on Saturday  Time spent for the meeting: 35 minutes    Delfino Lovett, MD  11/06/2022, 11:25 AM

## 2022-11-06 NOTE — Hospital Course (Addendum)
FIONN STRACKE is a 82 y.o. male with medical history significant of hypertension, hyperlipidemia, Dm2 with CKD stage 3, sick sinus syndrome, chronic CHFrEF, Pafib, Elevated troponin, hx of prostate cancer w recent admission for cellulitis apparently presents due to worsening foot swelling and redness.  Pt was told by cardiologist to come to ED.       1/18: Wife requesting hospice at home 1/19: comfort care. Plan for D/C home with Hospice tomorrow

## 2022-11-06 NOTE — Care Management Important Message (Signed)
Important Message  Patient Details  Name: Carlos Levine MRN: [id number] Date of Birth: 1940-12-31   Medicare Important Message Given:  Other (see comment)  On comfort care measures.  Medicare IM withheld at this time out of respect for patient and family.     [name] 11/06/2022, 11:58 AM

## 2022-11-06 NOTE — Progress Notes (Signed)
  Progress Note   Patient: Carlos Levine DOB: February 11, 1941 DOA: 10/22/2022     14 DOS: the patient was seen and examined on 11/06/2022   Brief hospital course: Carlos Levine is a 82 y.o. male with medical history significant of hypertension, hyperlipidemia, Dm2 with CKD stage 3, sick sinus syndrome, chronic CHFrEF, Pafib, Elevated troponin, hx of prostate cancer w recent admission for cellulitis apparently presents due to worsening foot swelling and redness.  Pt was told by cardiologist to come to ED.       1/18: Wife requesting hospice at home  Assessment and Plan:  Acute metabolic encephalopathy suspected due to hypercarbic respiratory failure with acidosis Significant functional and cognitive decline Diabetic foot infection severe peripheral arterial disease chronic lower extremity edema Severe PAD Severe cardiomyopathy A-fib, paroxysmal CAD T2DM Peripheral Neuropathy CKD 3b H/o Sick sinus Has pacemaker h/o  Lung cancer H/o Prostate cancer  Nutrition Status: Nutrition Problem: Severe Malnutrition Etiology: chronic illness (COPD, CHF, prostate cancer, lung cancer) Signs/Symptoms: moderate fat depletion, severe fat depletion, moderate muscle depletion, severe muscle depletion   Patient full comfort care for care at this point.  Family in agreement.  Hospice to be set up for home with a likely discharge on Saturday     Subjective: Sleepy.  No new issues  Physical Exam: Vitals:   11/06/22 0000 11/06/22 0427 11/06/22 0500 11/06/22 0756  BP: 110/70 98/67  107/74  Pulse: 81 83  60  Resp: 19 19  18   Temp: 98.6 F (37 C) 97.7 F (36.5 C)  98.1 F (36.7 C)  TempSrc: Axillary Axillary  Oral  SpO2: 100% 100%  100%  Weight:   91.5 kg   Height:       GENERAL:  82 y.o.-year-old patient lying in the bed with no acute distress. On [place] oxygen no resp distress at present LUNGS: decreased breath sounds bilaterally, no wheezing CARDIOVASCULAR: S1, S2 normal. No murmur    ABDOMEN: Soft, nontender, nondistended.  EXTREMITIES:  bilateral chronic ulcers, see pictures below NEUROLOGIC: Alert and awake, nonfocal  Data Reviewed:  There are no new results to review at this time.  Family Communication: Updated wife over phone  Disposition: Status is: Inpatient Remains inpatient appropriate because: Full comfort care now.  Hospice at home being coordinated with the plan for discharge this Saturday  Planned Discharge Destination:  Hospice at home   DVT prophylaxis-Eliquis Time spent: 35 minutes  Author: Monday, MD 11/06/2022 11:26 AM  For on call review [email or web address].

## 2022-11-06 NOTE — Plan of Care (Signed)
  Problem: Coping: Goal: Ability to adjust to condition or change in health will improve Outcome: Progressing   

## 2022-11-06 NOTE — Progress Notes (Signed)
[facility]) Hospital Liaison Note  Received request from MD, Ulyses Amor, for hospice services at home after discharge. TOC/Gilda aware. Chart and patient information under review by [facility] physician.   Spoke with spouse/Betty to initiate education related to hospice philosophy, services, and team approach to care. [name] verbalized understanding of information given. Per discussion, the plan is for patient to discharge home via [facility] once cleared to DC.    DME needs discussed. Patient has the following equipment in the home: N/A Patient requests the following equipment for delivery: O2 @ 3L Hospital bed Bedside Table   Address verified and is correct in the chart. [name] is the family member to contact to arrange time of equipment delivery. Per family request (MD & TOC aware) DME to arrive Saturday AM.   Please send signed and completed DNR home with patient/family. Please provide prescriptions at discharge as needed to ensure ongoing symptom management.    [facility] information and contact numbers given to family & above information shared with TOC.   Please call with any questions/concerns.    Thank you for the opportunity to participate in this patient's care.   Eugenie Birks, MSW [facility] Hospital Liaison  [phone]

## 2022-11-06 NOTE — Progress Notes (Signed)
SLP Cancellation Note  Patient Details Name: Carlos Levine MRN: [id number] DOB: 1941-02-26   Cancelled treatment:       Reason Eval/Treat Not Completed:  (chart reviewed)  Per MD note today, "Patient full comfort care for care at this point. Family in agreement. Hospice to be set up for home with a likely discharge on Saturday.". ST services will sign off at this time w/ MD to reconsult if new needs arise.     Jerilynn Som, MS, CCC-SLP Speech Language Pathologist Rehab Services; [facility] [phone] (ascom) Sten Dematteo 11/06/2022, 4:37 PM

## 2022-11-07 DIAGNOSIS — E11628 Type 2 diabetes mellitus with other skin complications: Secondary | ICD-10-CM | POA: Diagnosis not present

## 2022-11-07 DIAGNOSIS — C3491 Malignant neoplasm of unspecified part of right bronchus or lung: Secondary | ICD-10-CM | POA: Diagnosis not present

## 2022-11-07 DIAGNOSIS — I495 Sick sinus syndrome: Secondary | ICD-10-CM | POA: Diagnosis not present

## 2022-11-07 DIAGNOSIS — I480 Paroxysmal atrial fibrillation: Secondary | ICD-10-CM | POA: Diagnosis not present

## 2022-11-07 LAB — GLUCOSE, CAPILLARY
Glucose-Capillary: 189 mg/dL — ABNORMAL HIGH (ref 70–99)
Glucose-Capillary: 182 mg/dL — ABNORMAL HIGH (ref 70–99)

## 2022-11-07 NOTE — Progress Notes (Signed)
[facility]) Hospice hospital liaison note  Liaison following for patient who plans to d/c home on Saturday by EMS. DME is planned for delivery Saturday morning per family request.   Please do not hesitate to reach out for any hospice related questions or concerns.   Thank you for the opportunity to participate in this patient's care Thea Gist, BSN, RN Hospice hospital liaison [phone]

## 2022-11-07 NOTE — Progress Notes (Signed)
Nutrition Brief Note  Chart reviewed. Pt now transitioning to comfort care. Plan to discharge home with hospice.  No further nutrition interventions planned at this time.  Please re-consult as needed.   Levada Schilling, RD, LDN, CDCES Registered Dietitian II Certified Diabetes Care and Education Specialist Please refer to [facility] for RD and/or RD on-call/weekend/after hours pager

## 2022-11-07 NOTE — Plan of Care (Signed)
  Problem: Education: Goal: Ability to describe self-care measures that may prevent or decrease complications (Diabetes Survival Skills Education) will improve Outcome: Progressing   Problem: Coping: Goal: Ability to adjust to condition or change in health will improve Outcome: Progressing   Problem: Health Behavior/Discharge Planning: Goal: Ability to manage health-related needs will improve Outcome: Progressing   Problem: Skin Integrity: Goal: Risk for impaired skin integrity will decrease Outcome: Progressing   Problem: Tissue Perfusion: Goal: Adequacy of tissue perfusion will improve Outcome: Progressing   Problem: Education: Goal: Knowledge of General Education information will improve Description: Including pain rating scale, medication(s)/side effects and non-pharmacologic comfort measures Outcome: Progressing   Problem: Health Behavior/Discharge Planning: Goal: Ability to manage health-related needs will improve Outcome: Progressing   Problem: Clinical Measurements: Goal: Will remain free from infection Outcome: Progressing Goal: Respiratory complications will improve Outcome: Progressing   Problem: Activity: Goal: Risk for activity intolerance will decrease Outcome: Progressing   Problem: Nutrition: Goal: Adequate nutrition will be maintained Outcome: Progressing   Problem: Coping: Goal: Level of anxiety will decrease Outcome: Progressing   Problem: Elimination: Goal: Will not experience complications related to urinary retention Outcome: Progressing   Problem: Pain Managment: Goal: General experience of comfort will improve Outcome: Progressing   Problem: Safety: Goal: Ability to remain free from injury will improve Outcome: Progressing   Problem: Skin Integrity: Goal: Risk for impaired skin integrity will decrease Outcome: Progressing   Problem: Activity: Goal: Ability to return to baseline activity level will improve Outcome: Progressing

## 2022-11-07 NOTE — Progress Notes (Signed)
  Progress Note   Patient: Carlos Levine JDF:[id number] DOB: [birth year] DOA: 10/22/2022     15 DOS: the patient was seen and examined on 11/07/2022   Brief hospital course: Carlos Levine is a 82 y.o. male with medical history significant of hypertension, hyperlipidemia, Dm2 with CKD stage 3, sick sinus syndrome, chronic CHFrEF, Pafib, Elevated troponin, hx of prostate cancer w recent admission for cellulitis apparently presents due to worsening foot swelling and redness.  Pt was told by cardiologist to come to ED.       1/18: Wife requesting hospice at home 1/19: comfort care. Plan for D/C home with Hospice tomorrow  Assessment and Plan: Acute metabolic encephalopathy suspected due to hypercarbic respiratory failure with acidosis Significant functional and cognitive decline Diabetic foot infection severe peripheral arterial disease chronic lower extremity edema Severe PAD Severe cardiomyopathy A-fib, paroxysmal CAD T2DM Peripheral Neuropathy CKD 3b H/o Sick sinus Has pacemaker h/o  Lung cancer H/o Prostate cancer   Nutrition Status: Nutrition Problem: Severe Malnutrition Etiology: chronic illness (COPD, CHF, prostate cancer, lung cancer) Signs/Symptoms: moderate fat depletion, severe fat depletion, moderate muscle depletion, severe muscle depletion   Patient full comfort care for care at this point.  Family in agreement.  Hospice to be set up for home with a likely discharge on Saturday      Subjective: comfortable. sleepy  Physical Exam: Vitals:   11/06/22 0756 11/06/22 2020 11/07/22 0500 11/07/22 0809  BP: 107/74 104/67  97/83  Pulse: 60 90  77  Resp: 18 16  20   Temp: 98.1 F (36.7 C) 98.8 F (37.1 C)  98.2 F (36.8 C)  TempSrc: Oral Oral    SpO2: 100% 98%  99%  Weight:   91.4 kg   Height:       GENERAL:  82 y.o.-year-old patient lying in the bed with no acute distress.  LUNGS: decreased breath sounds bilaterally, no wheezing CARDIOVASCULAR: S1, S2 normal. No  murmur   ABDOMEN: Soft, nontender, nondistended.  EXTREMITIES:  bilateral chronic ulcers NEUROLOGIC:nonfocal Data Reviewed:  There are no new results to review at this time.  Family Communication: wife was updated y'day  Disposition: Status is: Inpatient Remains inpatient appropriate because: comfort care. Home with Hospice tomorrow  Planned Discharge Destination:  Home with Hospice   DVT prophylaxis - Eliquis Time spent: 20 minutes  Author: 91, MD 11/07/2022 12:25 PM  For on call review [email or web address].

## 2022-11-08 LAB — GLUCOSE, CAPILLARY
Glucose-Capillary: 171 mg/dL — ABNORMAL HIGH (ref 70–99)
Glucose-Capillary: 163 mg/dL — ABNORMAL HIGH (ref 70–99)

## 2022-11-08 MED ORDER — LORAZEPAM 0.5 MG PO TABS: 0.5000 mg | ORAL_TABLET | ORAL | 0 refills | Status: AC | PRN

## 2022-11-08 MED ORDER — HYOSCYAMINE SULFATE 0.125 MG SL SUBL: 0.1250 mg | SUBLINGUAL_TABLET | SUBLINGUAL | 0 refills | Status: AC | PRN

## 2022-11-08 MED ORDER — MORPHINE SULFATE (CONCENTRATE) 20 MG/ML PO SOLN: 5.0000 mg | ORAL | 0 refills | Status: AC | PRN

## 2022-11-08 NOTE — Progress Notes (Signed)
Discharge instructions/ medication details reviewed with patient's wife. All questions answered. Printed prescriptions and printed AVS placed in DC package.  EMS here for transportation.

## 2022-11-08 NOTE — TOC Transition Note (Addendum)
Transition of Care [facility]) - CM/SW Discharge Note   Patient Details  Name: Carlos Levine MRN: [id number] Date of Birth: 1940-12-01  Transition of [facility] CM/SW Contact:  Liliana Cline, LCSW Phone Number: 11/08/2022, 11:16 AM   Clinical Narrative:    Patient to DC home with [facility] services today. [facility] Representative Misty confirmed DME has been delivered to the home and patient's family has been updated. EMS paperwork completed. Will call for [facility] when RN is ready for transport.   12:52- [facility] called for transport. Patient is next on the list. RN notified.   Final next level of care: Home w Hospice Care Barriers to Discharge: Barriers Resolved   Patient Goals and CMS Choice      Discharge Placement                  Patient to be transferred to facility by: [facility] Name of family member notified: family notified by [facility] Rep Misty Patient and family notified of of transfer: 11/08/22  Discharge Plan and Services Additional resources added to the After Visit Summary for                          Representative spoke with at DME Agency: DME arranged by [facility]            Social Determinants of Health (SDOH) Interventions SDOH Screenings   Food Insecurity: No Food Insecurity (10/23/2022)  Housing: Low Risk  (10/23/2022)  Transportation Needs: No Transportation Needs (10/23/2022)  Utilities: Not At Risk (10/23/2022)  Financial Resource Strain: Low Risk  (04/13/2019)  Physical Activity: Unknown (04/13/2019)  Social Connections: Moderately Isolated (04/13/2019)  Stress: Stress Concern Present (04/13/2019)  Tobacco Use: Medium Risk (10/24/2022)     Readmission Risk Interventions    10/24/2022    3:03 PM  Readmission Risk Prevention Plan  Transportation Screening Complete  Medication Review (RN Care Manager) Complete  SW Recovery Care/Counseling Consult Complete  Palliative Care Screening Complete

## 2022-11-20 NOTE — Discharge Summary (Signed)
Physician Discharge Summary   Patient: Carlos Levine MRN: [id number] DOB: 12-16-1940  Admit date:     10/22/2022  Discharge date: 11/08/2022  Discharge Physician: Delfino Lovett   PCP: Lynnea Ferrier, MD   Recommendations at discharge:   Hospice at home  Discharge Diagnoses: Principal Problem:   Cellulitis in diabetic foot [facility]) Active Problems:   Stage 3b chronic kidney disease (CKD) (HCC)   PAF (paroxysmal atrial fibrillation) (HCC)   Benign essential hypertension   Type 2 diabetes mellitus with diabetic chronic kidney disease (HCC)   Pure hypercholesterolemia   Squamous cell lung cancer, right (HCC)   Sick sinus syndrome (HCC)   Normocytic anemia   Cellulitis   HFrEF (heart failure with reduced ejection fraction) (HCC)   Peripheral edema   Protein-calorie malnutrition, severe  Resolved Problems:   * No resolved hospital problems. *  Hospital Course: Carlos Levine is a 82 y.o. male with medical history significant of hypertension, hyperlipidemia, Dm2 with CKD stage 3, sick sinus syndrome, chronic CHFrEF, Pafib, Elevated troponin, hx of prostate cancer w recent admission for cellulitis apparently presents due to worsening foot swelling and redness.  Pt was told by cardiologist to come to ED.       1/18: Wife requesting hospice at home 1/19: comfort care. Plan for D/C home with Hospice tomorrow  Assessment and Plan: Acute metabolic encephalopathy suspected due to hypercarbic respiratory failure with acidosis Significant functional and cognitive decline Diabetic foot infection severe peripheral arterial disease chronic lower extremity edema Severe PAD Severe cardiomyopathy A-fib, paroxysmal CAD T2DM Peripheral Neuropathy CKD 3b H/o Sick sinus Has pacemaker h/o  Lung cancer H/o Prostate cancer   Nutrition Status: Nutrition Problem: Severe Malnutrition Etiology: chronic illness (COPD, CHF, prostate cancer, lung cancer) Signs/Symptoms: moderate fat depletion,  severe fat depletion, moderate muscle depletion, severe muscle depletion   Patient full comfort care for care at this point.  Family in agreement.  Hospice to be set up for home with a likely discharge on Saturday        Consultants: Palliative care, cardiology, wound care, vascular surgery Disposition: Hospice care Diet recommendation:  Discharge Diet Orders (From admission, onward)     Start     Ordered   11/08/22 0000  Diet - low sodium heart healthy        11/08/22 1148           Carb modified diet DISCHARGE MEDICATION: Allergies as of 11/08/2022       Reactions   Contrast Media [iodinated Contrast Media] Hives, Rash   Ace Inhibitors Other (See Comments)   Unknown reaction type   Latex Rash        Medication List     STOP taking these medications    apixaban 2.5 MG Tabs tablet Commonly known as: ELIQUIS   ascorbic acid 500 MG tablet Commonly known as: VITAMIN C   bacitracin ointment   bicalutamide 50 MG tablet Commonly known as: CASODEX   bisacodyl 5 MG EC tablet Commonly known as: DULCOLAX   dapagliflozin propanediol 10 MG Tabs tablet Commonly known as: FARXIGA   furosemide 20 MG tablet Commonly known as: LASIX   ipratropium-albuterol 0.5-2.5 (3) MG/3ML Soln Commonly known as: DUONEB   midodrine 10 MG tablet Commonly known as: PROAMATINE   multivitamin with minerals Tabs tablet   polyethylene glycol 17 g packet Commonly known as: MIRALAX / GLYCOLAX   pravastatin 80 MG tablet Commonly known as: PRAVACHOL   valACYclovir 500 MG tablet Commonly  known as: VALTREX   zinc sulfate 220 (50 Zn) MG capsule       TAKE these medications    feeding supplement Liqd Take 237 mLs by mouth 3 (three) times daily between meals.   hyoscyamine 0.125 MG SL tablet Commonly known as: LEVSIN SL Place 1 tablet (0.125 mg total) under the tongue every 4 (four) hours as needed for up to 3 days (excess oral secretions).   LORazepam 0.5 MG  tablet Commonly known as: ATIVAN Take 1 tablet (0.5 mg total) by mouth every 4 (four) hours as needed for up to 3 days for anxiety. May crush, mix with water and give sublingually if needed.   morphine 20 MG/ML concentrated solution Commonly known as: ROXANOL Take 0.25 mLs (5 mg total) by mouth every 4 (four) hours as needed for up to 3 days for severe pain, anxiety, shortness of breath, breakthrough pain or moderate pain. May give sublingually if needed.   pregabalin 50 MG capsule Commonly known as: LYRICA Take 1 capsule (50 mg total) by mouth 2 (two) times daily.   traZODone 50 MG tablet Commonly known as: DESYREL Take 0.5 tablets (25 mg total) by mouth at bedtime as needed for sleep.               Discharge Care Instructions  (From admission, onward)           Start     Ordered   11/08/22 0000  Discharge wound care:       Comments: As above   11/08/22 1148            Follow-up Information     Callwood, Dwayne D, MD. Go in 1 week(s).   Specialties: Cardiology, Internal Medicine Contact information: [address] [place] [address] [phone]         Curtis Sites III, MD. Schedule an appointment as soon as possible for a visit in 1 week(s).   Specialty: Internal Medicine Why: [facility] Discharge F/UP Contact information: [address] [place] [address] [phone]                Discharge Exam: Ceasar Mons Weights   11/05/22 0453 11/06/22 0500 11/07/22 0500  Weight: 91.5 kg 91.5 kg 91.4 kg   GENERAL:  82 y.o.-year-old patient lying in the bed -actively transitioning LUNGS: Cheyne-Stokes breathing CARDIOVASCULAR: S1, S2 normal. No murmur   ABDOMEN: Soft, nontender, nondistended.  EXTREMITIES:  bilateral chronic ulcers NEUROLOGIC:nonfocal  Condition at discharge: serious  The results of significant diagnostics from this hospitalization (including imaging, microbiology, ancillary and  laboratory) are listed below for reference.   Imaging Studies: DG Abd 1 View  Result Date: 11/04/2022 CLINICAL DATA:  Feeding tube placement EXAM: ABDOMEN - 1 VIEW COMPARISON:  11/03/2022 FINDINGS: Feeding tube in the stomach, unchanged since prior study. IMPRESSION: Feeding tube in the stomach. Electronically Signed   By: Charlett Nose M.D.   On: 11/04/2022 00:57   DG Abd 1 View  Result Date: 11/03/2022 CLINICAL DATA:  Feeding tube placement EXAM: ABDOMEN - 1 VIEW COMPARISON:  PET-CT 12/20/2021 FINDINGS: A feeding tube is noted with tip in the stomach body. Hazy opacity at the right lung base, some of which may be from pleural effusion. Thoracolumbar spondylosis and degenerative loss of intervertebral disc height. Gallstones noted. No dilated upper abdominal bowel. Contrast medium in the vicinity of the cecum. IMPRESSION: 1. Feeding tube tip: Stomach body. 2. Cholelithiasis. 3. Hazy opacity at the right lung  base, some of which may be from pleural effusion. 4. Thoracolumbar spondylosis and degenerative loss of intervertebral disc height. Electronically Signed   By: Gaylyn Rong M.D.   On: 11/03/2022 09:26   DG Swallowing Func-Speech Pathology  Result Date: 10/31/2022 Table formatting from the original result was not included. Objective Swallowing Evaluation: Type of Study: MBS-Modified Barium Swallow Study  Patient Details Name: Carlos Levine MRN: [id number] Date of Birth: 16-Mar-1941 Today's Date: 10/31/2022 Time: SLP Start Time (ACUTE ONLY): 1430 -SLP Stop Time (ACUTE ONLY): 1450 SLP Time Calculation (min) (ACUTE ONLY): 20 min Past Medical History: Past Medical History: Diagnosis Date  Arrhythmia   PAF  Arthritis   Cancer (HCC)   prostate  CHF (congestive heart failure) (HCC)   Chronic kidney disease   COPD (chronic obstructive pulmonary disease) (HCC)   Coronary artery disease   Diabetes mellitus without complication (HCC)   Dyspnea   with minimal exertion  Dysrhythmia   history of SVT  History of  kidney stones   History of radiation therapy   lung  Hyperkalemia 11/19/2017  Hyperlipidemia   Hypertension   Peripheral vascular disease (HCC)   Prostate cancer (HCC)   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  also has history of skin cancer Past Surgical History: Past Surgical History: Procedure Laterality Date  AMPUTATION FINGER / THUMB Right   BACK SURGERY  1975  removed 2 discs and fused others  LOWER EXTREMITY ANGIOGRAPHY Left 10/24/2022  Procedure: Lower Extremity Angiography;  Surgeon: Renford Dills, MD;  Location: [facility] INVASIVE CV LAB;  Service: Cardiovascular;  Laterality: Left;  PACEMAKER LEADLESS INSERTION N/A 07/25/2020  Procedure: PACEMAKER LEADLESS INSERTION;  Surgeon: Marcina Millard, MD;  Location: [facility] INVASIVE CV LAB;  Service: Cardiovascular;  Laterality: N/A;  PROSTATE CRYOABLATION    SPINAL FUSION    STENT PLACEMENT ILIAC ([facility] HX) Left 2014  Left common iliac and left external iliac by Dr. Gilda Crease  VIDEO BRONCHOSCOPY WITH ENDOBRONCHIAL NAVIGATION N/A 05/30/2019  Procedure: VIDEO BRONCHOSCOPY WITH ENDOBRONCHIAL NAVIGATION, DIABETIC;  Surgeon: Vida Rigger, MD;  Location: [facility] ORS;  Service: Thoracic;  Laterality: N/A;  VIDEO BRONCHOSCOPY WITH ENDOBRONCHIAL NAVIGATION N/A 05/28/2020  Procedure: VIDEO BRONCHOSCOPY WITH ENDOBRONCHIAL NAVIGATION;  Surgeon: Vida Rigger, MD;  Location: [facility] ORS;  Service: Thoracic;  Laterality: N/A;  VIDEO BRONCHOSCOPY WITH ENDOBRONCHIAL ULTRASOUND N/A 05/30/2019  Procedure: VIDEO BRONCHOSCOPY WITH ENDOBRONCHIAL ULTRASOUND, DIABETIC;  Surgeon: Vida Rigger, MD;  Location: [facility] ORS;  Service: Thoracic;  Laterality: N/A;  VIDEO BRONCHOSCOPY WITH ENDOBRONCHIAL ULTRASOUND N/A 05/28/2020  Procedure: VIDEO BRONCHOSCOPY WITH ENDOBRONCHIAL ULTRASOUND;  Surgeon: Vida Rigger, MD;  Location: [facility] ORS;  Service: Thoracic;  Laterality: N/A; HPI: Pt is a 82 y.o. male with medical history unsure of his Cognitive status but w/ significant of hypertension,  hyperlipidemia, Dm2 with CKD stage 3, sick sinus syndrome, chronic CHFrEF, Pafib, Elevated troponin, hx of prostate cancer w Recent admission for cellulitis in 09/2022 apparently presents due to worsening foot swelling and redness.  Note that patient declined Rehab during recent admission and went home instead.  Pt has apparent Cognitive decline, phonations only and yells out often during the afternoons.  PT is holding on him d/t being inappropriate medically at this time.   CXR: Slightly increasing interstitial markings within the left lung,  which may reflect edema or atypical/viral infection.  Head CT: No acute intracranial findings.  2. Chronic microvascular ischemic change and cerebral volume loss.  CT of Chest this admit: Increasing consolidation in the right lower and right upper lobes  when compared with the prior exam from June of 2023. Increasing  right-sided pleural effusion is noted as well. Persistent volume  loss in fullness in the right hilum is noted.  Subjective: Pt required maximal stimulation for inital arousal  Recommendations for follow up therapy are one component of a multi-disciplinary discharge planning process, led by the attending physician.  Recommendations may be updated based on patient status, additional functional criteria and insurance authorization. Assessment / Plan / Recommendation   10/31/2022   9:00 PM Clinical Impressions Clinical Impression Pt presents with cognition based severe oral phase dysphagia that is c/b boluses escaping out of pt's mouth, oral holding without any response to multi-modal assistance to swallow. Pt able to produce 1 swallow with difficulty fulling observing pt's swallow d/t pt's inability to maintain his position in chair. Study determined d/t pt's increased frustration and inability to consistently produce a swallow. At this time, pt's prognosis for sustaining nutrition and hydration with PO intake are considered poor. Given pt's current cognitive state,  he is unable to participate in skilled ST services. Recommend continued NPO. MD made aware of these results. SLP Visit Diagnosis Dysphagia, oropharyngeal phase (R13.12) Impact on safety and function Severe aspiration risk;Risk for inadequate nutrition/hydration     10/31/2022   9:00 PM Treatment Recommendations Treatment Recommendations No treatment recommended at this time     10/31/2022   9:00 PM Prognosis Prognosis for Safe Diet Advancement -- Barriers to Reach Goals Cognitive deficits;Language deficits;Time post onset;Severity of deficits;Behavior Barriers/Prognosis Comment cognitive decline   10/31/2022   9:00 PM Diet Recommendations SLP Diet Recommendations NPO Medication Administration Via alternative means     10/31/2022   9:00 PM Other Recommendations Oral Care Recommendations Oral care QID Follow Up Recommendations -- Functional Status Assessment Patient has had a recent decline in their functional status and/or demonstrates limited ability to make significant improvements in function in a reasonable and predictable amount of time   10/31/2022  10:00 AM Frequency and Duration  Speech Therapy Frequency (ACUTE ONLY) -- Treatment Duration --     10/31/2022   9:00 PM Oral Phase Oral Phase Impaired    10/31/2022   9:00 PM Pharyngeal Phase Pharyngeal Phase Impaired    10/31/2022   9:00 PM Cervical Esophageal Phase  Cervical Esophageal Phase [facility] Happi Overton 10/31/2022, 10:00 PM                     DG Chest Port 1 View  Result Date: 10/28/2022 CLINICAL DATA:  Encephalopathy EXAM: PORTABLE CHEST 1 VIEW COMPARISON:  10/22/2022, 10/01/2022 FINDINGS: Loop recorder. Stable cardiomegaly. Aortic atherosclerosis. Chronic post treatment changes within the right hemithorax with volume loss and fibrosis. Moderate right pleural effusion, similar to prior. Slightly increasing interstitial markings within the left lung. No left-sided effusion. No pneumothorax. IMPRESSION: 1. Slightly increasing interstitial markings within the left  lung, which may reflect edema or atypical/viral infection. 2. Otherwise, no significant interval change. Electronically Signed   By: Duanne Guess D.O.   On: 10/28/2022 10:59   CT HEAD WO CONTRAST ( )  Result Date: 10/28/2022 CLINICAL DATA:  Encephalopathy EXAM: CT HEAD WITHOUT CONTRAST TECHNIQUE: Contiguous axial images were obtained from the base of the skull through the vertex without intravenous contrast. RADIATION DOSE REDUCTION: This exam was performed according to the departmental dose-optimization program which includes automated exposure control, adjustment of the mA and/or kV according to patient size and/or use of iterative reconstruction technique. COMPARISON:  06/11/2017, 04/07/2019 FINDINGS: Brain: No evidence of acute  infarction, hemorrhage, hydrocephalus, extra-axial collection or mass lesion/mass effect. Moderate low-density changes within the periventricular and subcortical white matter compatible with chronic microvascular ischemic change. Moderate diffuse cerebral volume loss. Vascular: Atherosclerotic calcifications involving the large vessels of the skull base. No unexpected hyperdense vessel. Skull: Normal. Negative for fracture or focal lesion. Sinuses/Orbits: No acute finding. Other: None. IMPRESSION: 1. No acute intracranial findings. 2. Chronic microvascular ischemic change and cerebral volume loss. Electronically Signed   By: Duanne Guess D.O.   On: 10/28/2022 10:53   ECHOCARDIOGRAM COMPLETE  Result Date: 10/25/2022    ECHOCARDIOGRAM REPORT   Patient Name:   Carlos Levine Date of Exam: 10/24/2022 Medical Rec #:  [id number]      Height:       68.0 in Accession #:    [id number]     Weight:       201.5 lb Date of Birth:  17-Feb-1941      BSA:          2.050 m Patient Age:    83 years       BP:           92/72 mmHg Patient Gender: M              HR:           94 bpm. Exam Location:  [facility] Procedure: 2D Echo, Cardiac Doppler and Color Doppler Indications:     CHF--acute systolic  I50.21  History:         Patient has prior history of Echocardiogram examinations, most                  recent 05/29/2019. CHF, COPD; Risk Factors:Hypertension.  Sonographer:     Cristela Blue Referring Phys:  [id number] [facility] MICHELLE TANG Diagnosing Phys: Adrian Blackwater  Sonographer Comments: Technically difficult study due to poor echo windows, no apical window and no parasternal window. Image acquisition challenging due to uncooperative patient. IMPRESSIONS  1. Left ventricular ejection fraction, by estimation, is 35 to 40%. The left ventricle has moderately decreased function. The left ventricle demonstrates global hypokinesis. The left ventricular internal cavity size was severely dilated. There is mild left ventricular hypertrophy. Left ventricular diastolic function could not be evaluated.  2. Right ventricular systolic function is moderately reduced. The right ventricular size is severely enlarged. Mildly increased right ventricular wall thickness.  3. Left atrial size was severely dilated.  4. Right atrial size was severely dilated.  5. The mitral valve was not well visualized. No evidence of mitral valve regurgitation.  6. The aortic valve was not well visualized. Aortic valve regurgitation is not visualized. Aortic valve sclerosis/calcification is present, without any evidence of aortic stenosis.  7. Aortic unable to see. FINDINGS  Left Ventricle: Left ventricular ejection fraction, by estimation, is 35 to 40%. The left ventricle has moderately decreased function. The left ventricle demonstrates global hypokinesis. The left ventricular internal cavity size was severely dilated. There is mild left ventricular hypertrophy. Left ventricular diastolic function could not be evaluated. Right Ventricle: The right ventricular size is severely enlarged. Mildly increased right ventricular wall thickness. Right ventricular systolic function is moderately reduced. Left Atrium: Left atrial size was severely dilated. Right  Atrium: Right atrial size was severely dilated. Pericardium: The pericardium was not assessed. Mitral Valve: The mitral valve was not well visualized. No evidence of mitral valve regurgitation. Tricuspid Valve: The tricuspid valve is not assessed. Tricuspid valve regurgitation is not demonstrated. Aortic Valve: The aortic valve was not  well visualized. Aortic valve regurgitation is not visualized. Aortic valve sclerosis/calcification is present, without any evidence of aortic stenosis. Pulmonic Valve: The pulmonic valve was not assessed. Pulmonic valve regurgitation is not visualized. Aorta: Unable to see. IAS/Shunts: The interatrial septum was not assessed.  LEFT VENTRICLE PLAX 2D LVIDd:         4.50 cm LVIDs:         3.70 cm LV PW:         1.20 cm LV IVS:        1.10 cm  LEFT ATRIUM         Index LA diam:    5.10 cm 2.49 cm/m Neoma Laming Electronically signed by Neoma Laming Signature Date/Time: 10/25/2022/9:08:14 AM    Final    PERIPHERAL VASCULAR CATHETERIZATION  Result Date: 10/24/2022 See surgical note for result.  US ARTERIAL LOWER EXTREMITY DUPLEX BILATERAL  Result Date: 10/23/2022 CLINICAL DATA:  82 year old male with a infection EXAM: NONINVASIVE PHYSIOLOGIC VASCULAR STUDY OF BILATERAL LOWER EXTREMITIES TECHNIQUE: Evaluation of both lower extremities was performed at rest, including calculation of ankle-brachial indices, directed duplex COMPARISON:  None Available. FINDINGS: Right ABI:  Not acquired Left ABI:  Not acquired Right Lower Extremity: Directed duplex of the right lower extremity demonstrates triphasic waveform of the common femoral artery, profunda femoris and proximal SFA. Distal SFA and popliteal artery demonstrate monophasic waveform. Posterior tibial artery and anterior tibial artery monophasic. No elevated velocity identified. Left Lower Extremity: Directed duplex of the left lower extremity demonstrates monophasic postobstructive waveform of the common femoral artery, profunda  femoris, SFA, popliteal artery. Monophasic posterior tibial artery and anterior tibial artery. The imaged portions of the SFA are patent. No elevated velocities. IMPRESSION: Right: Directed duplex of the right lower extremity demonstrates evidence of multifocal SFA disease, contributing to monophasic popliteal and tibial artery waveforms. Left: Directed duplex of the left lower extremity demonstrates occlusive disease of the iliac system, with patency of the left femoropopliteal system, monophasic waveforms throughout. Signed, Dulcy Fanny. Nadene Rubins, RPVI [facility] Electronically Signed   By: Corrie Mckusick D.O.   On: 10/23/2022 14:12   DG Foot 2 Views Left  Result Date: 10/23/2022 CLINICAL DATA:  Left foot blisters and skin breakdown. EXAM: LEFT FOOT - 2 VIEW COMPARISON:  None Available. FINDINGS: No bony destruction or periosteal reaction. No acute fracture or dislocation. Joint spaces are preserved. Bone mineralization is normal. Dorsal foot soft tissue swelling. IMPRESSION: 1. Dorsal foot soft tissue swelling. No acute osseous abnormality. Electronically Signed   By: Titus Dubin M.D.   On: 10/23/2022 11:30   CT CHEST WO CONTRAST  Result Date: 10/23/2022 CLINICAL DATA:  Right-sided pleural effusion EXAM: CT CHEST WITHOUT CONTRAST TECHNIQUE: Multidetector CT imaging of the chest was performed following the standard protocol without IV contrast. RADIATION DOSE REDUCTION: This exam was performed according to the departmental dose-optimization program which includes automated exposure control, adjustment of the mA and/or kV according to patient size and/or use of iterative reconstruction technique. COMPARISON:  CT 04/01/2022, chest x-ray from the previous day. FINDINGS: Cardiovascular: Limited due to lack of IV contrast. Atherosclerotic calcifications are noted. Heavy coronary calcifications are seen. Heart is not significantly enlarged in size.  Lead less pacemaker is noted within the distal aspect of the right ventricle. Mediastinum/Nodes: Thoracic inlet is within normal limits. No hilar or mediastinal adenopathy is noted. The esophagus as visualized is within normal limits. Lungs/Pleura: Mild emphysematous changes are noted in the left lung.  No focal infiltrate or sizable effusion is seen. No sizable parenchymal nodules are noted. On the right there is a large pleural effusion which has increased in the interval from the prior exam. Lower lobe consolidation is noted as well as some upper lobe consolidation likely related to prior treatment. Fullness in the right hilum is noted stable from previous exams. Upper Abdomen: Visualized upper abdomen is within normal limits. Musculoskeletal: No rib abnormality is noted. Degenerative changes of thoracic spine are seen. IMPRESSION: Increasing consolidation in the right lower and right upper lobes when compared with the prior exam from June of 2023. Increasing right-sided pleural effusion is noted as well. Persistent volume loss in fullness in the right hilum is noted related to prior therapy. No other focal abnormality is noted. Aortic Atherosclerosis (ICD10-I70.0) and Emphysema (ICD10-J43.9). Electronically Signed   By: Alcide Clever M.D.   On: 10/23/2022 01:47   DG Chest Portable 1 View  Result Date: 10/22/2022 CLINICAL DATA:  Nonhealing left foot wound and shortness of breath. EXAM: PORTABLE CHEST 1 VIEW COMPARISON:  October 01, 2022 FINDINGS: The cardiac silhouette is mildly enlarged and unchanged in size. There is marked severity calcification of the thoracic aorta. There is stable opacification of the mid right lung and right lung base with subsequent right-sided volume loss. Mild, stable right upper lobe scarring and/or airspace disease is also seen. Chronic appearing increased lung markings are seen throughout the left lung. A stable moderate sized right-sided pleural effusion is noted. No pneumothorax  is identified. Multilevel degenerative changes are seen throughout the thoracic spine. IMPRESSION: 1. Stable post treatment changes throughout the right lung with subsequent right-sided volume loss. Electronically Signed   By: Aram Candela M.D.   On: 10/22/2022 21:08    Microbiology: Results for orders placed or performed during the hospital encounter of 10/22/22  Culture, blood (Routine X 2) w Reflex to ID Panel     Status: None   Collection Time: 10/23/22  2:43 PM   Specimen: BLOOD  Result Value Ref Range Status   Specimen Description BLOOD BLOOD LEFT HAND  Final   Special Requests BOTTLES DRAWN AEROBIC AND ANAEROBIC BCLV  Final   Culture   Final    NO GROWTH 5 DAYS Performed at [facility], [address] [place], [place] [address]    Report Status 10/28/2022 FINAL  Final  Culture, blood (Routine X 2) w Reflex to ID Panel     Status: None   Collection Time: 10/23/22  3:30 PM   Specimen: BLOOD  Result Value Ref Range Status   Specimen Description BLOOD BLOOD RIGHT HAND  Final   Special Requests BOTTLES DRAWN AEROBIC AND ANAEROBIC BCLV  Final   Culture   Final    NO GROWTH 5 DAYS Performed at [facility], [address], [place], [place] [address]    Report Status 10/28/2022 FINAL  Final  Aerobic Culture w Gram Stain (superficial specimen)     Status: None   Collection Time: 10/23/22  4:44 PM   Specimen: Wound  Result Value Ref Range Status   Specimen Description   Final    WOUND Performed at [facility], [address], [place], [place] [address]    Special Requests   Final    FOOT Performed at [facility], [address] [place], [place] [address]    Gram Stain   Final    FEW WBC PRESENT, PREDOMINANTLY PMN FEW GRAM POSITIVE COCCI IN PAIRS RARE BUDDING YEAST  SEEN RARE GRAM NEGATIVE RODS Performed at [facility], [address], [place], [place] [address]    Culture   Final    MODERATE ENTEROBACTER  CLOACAE MODERATE STENOTROPHOMONAS MALTOPHILIA MODERATE CANDIDA GUILLIERMONDII    Report Status 10/28/2022 FINAL  Final   Organism ID, Bacteria ENTEROBACTER CLOACAE  Final   Organism ID, Bacteria STENOTROPHOMONAS MALTOPHILIA  Final      Susceptibility   Enterobacter cloacae - MIC*    CEFAZOLIN >=64 RESISTANT Resistant     CEFEPIME <=0.12 SENSITIVE Sensitive     CEFTAZIDIME <=1 SENSITIVE Sensitive     CIPROFLOXACIN <=0.25 SENSITIVE Sensitive     GENTAMICIN <=1 SENSITIVE Sensitive     IMIPENEM 0.5 SENSITIVE Sensitive     TRIMETH/SULFA <=20 SENSITIVE Sensitive     PIP/TAZO <=4 SENSITIVE Sensitive     * MODERATE ENTEROBACTER CLOACAE   Stenotrophomonas maltophilia - MIC*    LEVOFLOXACIN 0.5 SENSITIVE Sensitive     TRIMETH/SULFA <=20 SENSITIVE Sensitive     * MODERATE STENOTROPHOMONAS MALTOPHILIA  Culture, blood (Routine X 2) w Reflex to ID Panel     Status: None   Collection Time: 10/28/22 11:26 AM   Specimen: BLOOD  Result Value Ref Range Status   Specimen Description BLOOD BLOOD RIGHT HAND  Final   Special Requests   Final    BOTTLES DRAWN AEROBIC AND ANAEROBIC Blood Culture adequate volume   Culture   Final    NO GROWTH 5 DAYS Performed at [facility], [address] [place], [place] [address]    Report Status 11/02/2022 FINAL  Final  Culture, blood (Routine X 2) w Reflex to ID Panel     Status: None   Collection Time: 10/28/22 11:34 AM   Specimen: BLOOD  Result Value Ref Range Status   Specimen Description BLOOD BLOOD LEFT HAND  Final   Special Requests   Final    BOTTLES DRAWN AEROBIC AND ANAEROBIC Blood Culture adequate volume   Culture   Final    NO GROWTH 5 DAYS Performed at [facility], [address] [place], [place] [address]    Report Status 11/02/2022 FINAL  Final    Labs: CBC: Recent Labs  Lab 11/06/22 0417  WBC 8.8  HGB 11.0*  HCT 37.5*  MCV 103.9*  PLT 116*   Basic Metabolic Panel: Recent Labs  Lab 11/03/22 1122  11/03/22 1638 11/04/22 0610 11/04/22 0851 11/04/22 1648 11/06/22 0417  NA  --   --   --  147*  --  144  K  --   --   --  4.1  --  4.8  CL  --   --   --  110  --  107  CO2  --   --   --  31  --  31  GLUCOSE  --   --   --  151*  --  135*  BUN  --   --   --  55*  --  77*  CREATININE  --   --   --  1.23  --  1.34*  CALCIUM  --   --   --  8.2*  --  8.5*  MG 2.2 2.3 2.4  --  2.4  --   PHOS 3.1 2.9 3.6  --  3.7  --    Liver Function Tests: No results for input(s): "AST", "ALT", "ALKPHOS", "BILITOT", "PROT", "ALBUMIN" in the last 168 hours. CBG: Recent Labs  Lab 11/06/22 1144 11/07/22 4282  11/07/22 0809 11/08/22 0236 11/08/22 0820  GLUCAP 300* 189* 182* 171* 163*    Discharge time spent: greater than 30 minutes.  Signed: Delfino Lovett, MD [facility] 11/07/2022

## 2022-11-20 DEATH — deceased

## 2022-11-25 ENCOUNTER — Other Ambulatory Visit: Payer: Self-pay | Admitting: Urology

## 2022-12-01 ENCOUNTER — Other Ambulatory Visit: Payer: Medicare Other

## 2022-12-03 ENCOUNTER — Ambulatory Visit: Payer: Medicare Other | Admitting: Urology
